# Patient Record
Sex: Male | Born: 1940 | Race: White | Hispanic: No | Smoking: Former smoker
Health system: Southern US, Community
[De-identification: ages and names within clinical notes are randomized; demographics above are authoritative.]

## PROBLEM LIST (undated history)

## (undated) DIAGNOSIS — C61 Malignant neoplasm of prostate: Secondary | ICD-10-CM

## (undated) DIAGNOSIS — K439 Ventral hernia without obstruction or gangrene: Secondary | ICD-10-CM

## (undated) DIAGNOSIS — I4891 Unspecified atrial fibrillation: Secondary | ICD-10-CM

## (undated) DIAGNOSIS — R251 Tremor, unspecified: Secondary | ICD-10-CM

## (undated) DIAGNOSIS — D696 Thrombocytopenia, unspecified: Secondary | ICD-10-CM

## (undated) DIAGNOSIS — R3989 Other symptoms and signs involving the genitourinary system: Secondary | ICD-10-CM

## (undated) DIAGNOSIS — J449 Chronic obstructive pulmonary disease, unspecified: Secondary | ICD-10-CM

## (undated) DIAGNOSIS — M1990 Unspecified osteoarthritis, unspecified site: Secondary | ICD-10-CM

## (undated) DIAGNOSIS — I499 Cardiac arrhythmia, unspecified: Secondary | ICD-10-CM

## (undated) DIAGNOSIS — G629 Polyneuropathy, unspecified: Secondary | ICD-10-CM

## (undated) DIAGNOSIS — G4730 Sleep apnea, unspecified: Secondary | ICD-10-CM

## (undated) DIAGNOSIS — I10 Essential (primary) hypertension: Secondary | ICD-10-CM

## (undated) DIAGNOSIS — Z952 Presence of prosthetic heart valve: Secondary | ICD-10-CM

## (undated) DIAGNOSIS — K30 Functional dyspepsia: Secondary | ICD-10-CM

## (undated) HISTORY — DX: Ventral hernia without obstruction or gangrene: K43.9

## (undated) HISTORY — DX: Polyneuropathy, unspecified: G62.9

## (undated) HISTORY — DX: Sleep apnea, unspecified: G47.30

## (undated) HISTORY — DX: Unspecified atrial fibrillation: I48.91

## (undated) HISTORY — DX: Unspecified osteoarthritis, unspecified site: M19.90

## (undated) HISTORY — PX: VALVE REPLACEMENT: SUR13

## (undated) HISTORY — DX: Tremor, unspecified: R25.1

## (undated) HISTORY — DX: Thrombocytopenia, unspecified: D69.6

## (undated) HISTORY — DX: Presence of prosthetic heart valve: Z95.2

## (undated) HISTORY — DX: Malignant neoplasm of prostate: C61

## (undated) HISTORY — PX: CALDWELL LUC: SHX1284

## (undated) HISTORY — PX: OTHER SURGICAL HISTORY: SHX169

## (undated) HISTORY — DX: Functional dyspepsia: K30

## (undated) HISTORY — DX: Chronic obstructive pulmonary disease, unspecified: J44.9

---

## 1994-08-05 DIAGNOSIS — Z954 Presence of other heart-valve replacement: Secondary | ICD-10-CM | POA: Insufficient documentation

## 2004-10-01 ENCOUNTER — Ambulatory Visit: Payer: Self-pay | Admitting: Cardiology

## 2005-06-19 ENCOUNTER — Ambulatory Visit: Payer: Self-pay | Admitting: Internal Medicine

## 2009-09-19 ENCOUNTER — Ambulatory Visit: Payer: Self-pay | Admitting: Ophthalmology

## 2010-08-27 ENCOUNTER — Ambulatory Visit: Payer: Self-pay | Admitting: Sports Medicine

## 2012-06-26 ENCOUNTER — Ambulatory Visit: Payer: Self-pay | Admitting: Sports Medicine

## 2013-04-21 ENCOUNTER — Ambulatory Visit: Payer: Self-pay | Admitting: Oncology

## 2013-05-06 ENCOUNTER — Ambulatory Visit: Payer: Self-pay | Admitting: Oncology

## 2013-05-06 LAB — CBC CANCER CENTER
Eosinophil #: 0.1 x10 3/mm (ref 0.0–0.7)
Eosinophil %: 1.4 %
HGB: 15.1 g/dL (ref 13.0–18.0)
Lymphocyte %: 28.7 %
MCHC: 33.8 g/dL (ref 32.0–36.0)
MCV: 100 fL (ref 80–100)
Monocyte #: 0.3 x10 3/mm (ref 0.2–1.0)
Monocyte %: 7.8 %
Neutrophil #: 2.3 x10 3/mm (ref 1.4–6.5)
Platelet: 88 x10 3/mm — ABNORMAL LOW (ref 150–440)
RBC: 4.47 10*6/uL (ref 4.40–5.90)
RDW: 13.2 % (ref 11.5–14.5)
WBC: 3.7 x10 3/mm — ABNORMAL LOW (ref 3.8–10.6)

## 2013-05-06 LAB — IRON AND TIBC
Iron Bind.Cap.(Total): 392 ug/dL (ref 250–450)
Iron Saturation: 25 %
Unbound Iron-Bind.Cap.: 293 ug/dL

## 2013-05-06 LAB — FERRITIN: Ferritin (ARMC): 89 ng/mL (ref 8–388)

## 2013-05-06 LAB — FOLATE: Folic Acid: 28.6 ng/mL (ref 3.1–100.0)

## 2013-05-06 LAB — LACTATE DEHYDROGENASE: LDH: 183 U/L (ref 85–241)

## 2013-06-05 ENCOUNTER — Ambulatory Visit: Payer: Self-pay | Admitting: Oncology

## 2014-10-04 DIAGNOSIS — R310 Gross hematuria: Secondary | ICD-10-CM | POA: Insufficient documentation

## 2014-10-04 DIAGNOSIS — R339 Retention of urine, unspecified: Secondary | ICD-10-CM | POA: Insufficient documentation

## 2014-10-06 DIAGNOSIS — R8281 Pyuria: Secondary | ICD-10-CM | POA: Insufficient documentation

## 2014-11-16 ENCOUNTER — Emergency Department
Admit: 2014-11-16 | Disposition: A | Discharge status: Home / Self Care | Payer: Self-pay | Admitting: Emergency Medicine

## 2014-11-22 DIAGNOSIS — C61 Malignant neoplasm of prostate: Secondary | ICD-10-CM | POA: Diagnosis present

## 2014-12-02 DIAGNOSIS — D696 Thrombocytopenia, unspecified: Secondary | ICD-10-CM | POA: Insufficient documentation

## 2014-12-07 DIAGNOSIS — R0602 Shortness of breath: Secondary | ICD-10-CM | POA: Insufficient documentation

## 2014-12-13 ENCOUNTER — Encounter: Payer: Self-pay | Admitting: Oncology

## 2014-12-13 ENCOUNTER — Encounter (INDEPENDENT_AMBULATORY_CARE_PROVIDER_SITE_OTHER): Payer: Self-pay

## 2014-12-13 ENCOUNTER — Inpatient Hospital Stay: Payer: PPO | Attending: Oncology | Admitting: Oncology

## 2014-12-13 VITALS — BP 123/78 | HR 82 | Temp 95.9°F | Resp 18 | Ht 68.9 in | Wt 215.4 lb

## 2014-12-13 DIAGNOSIS — M129 Arthropathy, unspecified: Secondary | ICD-10-CM | POA: Diagnosis not present

## 2014-12-13 DIAGNOSIS — Z8546 Personal history of malignant neoplasm of prostate: Secondary | ICD-10-CM | POA: Diagnosis not present

## 2014-12-13 DIAGNOSIS — Z87891 Personal history of nicotine dependence: Secondary | ICD-10-CM

## 2014-12-13 DIAGNOSIS — D72819 Decreased white blood cell count, unspecified: Secondary | ICD-10-CM | POA: Diagnosis not present

## 2014-12-13 DIAGNOSIS — Z79899 Other long term (current) drug therapy: Secondary | ICD-10-CM

## 2014-12-13 DIAGNOSIS — G4730 Sleep apnea, unspecified: Secondary | ICD-10-CM | POA: Diagnosis not present

## 2014-12-13 DIAGNOSIS — J449 Chronic obstructive pulmonary disease, unspecified: Secondary | ICD-10-CM | POA: Diagnosis not present

## 2014-12-13 DIAGNOSIS — I4891 Unspecified atrial fibrillation: Secondary | ICD-10-CM

## 2014-12-13 DIAGNOSIS — D696 Thrombocytopenia, unspecified: Secondary | ICD-10-CM | POA: Insufficient documentation

## 2014-12-13 DIAGNOSIS — G629 Polyneuropathy, unspecified: Secondary | ICD-10-CM | POA: Insufficient documentation

## 2014-12-21 ENCOUNTER — Telehealth: Payer: Self-pay | Admitting: *Deleted

## 2014-12-21 NOTE — Telephone Encounter (Signed)
They need office note form [place] so they know what is going on before they can schedule anything

## 2014-12-26 ENCOUNTER — Telehealth: Payer: Self-pay | Admitting: *Deleted

## 2014-12-26 NOTE — Progress Notes (Signed)
[place]  [phone] Fax:(336) [phone]  ID: Earl Ramirez OB: 1940-08-29  MR#: [id number]  CWC#:[id number]  Patient Care Team: Adrian Prows, MD as PCP - General (Infectious Diseases) Murrell Redden, MD (Urology) Teodoro Spray, MD (Cardiology)  CHIEF COMPLAINT:  Chief Complaint  Patient presents with  . Follow-up    thrombocytopenia last seen 2014    INTERVAL HISTORY: Patient last evaluated in October 2014 referred back for persistent decrease in platelets. He continues to feel well and remains asymptomatic. He has no neurologic complaints. He denies any recent fevers or illnesses. He has a good appetite and denies weight loss. He denies any easy bleeding or bruising. He denies any chest pain or shortness of breath. He denies any nausea, vomiting, constipation, or diarrhea. He has no urinary complaints. Patient feels at his baseline and offers no specific complaints today.  REVIEW OF SYSTEMS:   Review of Systems  Constitutional: Negative.   Endo/Heme/Allergies: Does not bruise/bleed easily.    As per HPI. Otherwise, a complete review of systems is negatve.  PAST MEDICAL HISTORY: Past Medical History  Diagnosis Date  . Atrial fibrillation   . Arthritis   . COPD (chronic obstructive pulmonary disease)   . Sleep apnea   . Neuropathy   . Hernia of abdominal wall   . Indigestion   . Aortic valve replaced     1998  . Prostate cancer   . Tremors of nervous system   . Thrombocytopenia     PAST SURGICAL HISTORY: Past Surgical History  Procedure Laterality Date  . Caldwell luc      FAMILY HISTORY History reviewed. No pertinent family history.     ADVANCED DIRECTIVES:       HEALTH MAINTENANCE: History  Substance Use Topics  . Smoking status: Former Smoker    Types: Cigarettes  . Smokeless tobacco: Former Systems developer    Quit date: 09/24/2000  . Alcohol Use: No     Colonoscopy:  PAP:  Bone density:  Lipid panel:  Allergies    Allergen Reactions  . Oxycodone-Acetaminophen Nausea Only and Shortness Of Breath  . Amoxicillin Itching    Hand itching  . Penicillins Rash    Current Outpatient Prescriptions  Medication Sig Dispense Refill  . albuterol (PROVENTIL HFA;VENTOLIN HFA) 108 (90 BASE) MCG/ACT inhaler Inhale into the lungs.    Marland Kitchen aspirin EC 81 MG tablet Take 81 mg by mouth.    . folic acid (FOLVITE) 1 MG tablet Take 1 mg by mouth.    . metoprolol succinate (TOPROL-XL) 25 MG 24 hr tablet Take by mouth.    . tamsulosin (FLOMAX) 0.4 MG CAPS capsule Take 0.4 mg by mouth.     No current facility-administered medications for this visit.    OBJECTIVE: Filed Vitals:   12/13/14 1518  BP: 123/78  Pulse: 82  Temp: 95.9 F (35.5 C)  Resp: 18     Body mass index is 31.9 kg/(m^2).    ECOG FS:0 - Asymptomatic  General: Well-developed, well-nourished, no acute distress. Eyes: Pink conjunctiva, anicteric sclera. HEENT: Normocephalic, moist mucous membranes, clear oropharnyx. Lungs: Clear to auscultation bilaterally. Heart: Regular rate and rhythm. No rubs, murmurs, or gallops. Abdomen: Soft, nontender, nondistended. No organomegaly noted, normoactive bowel sounds. Musculoskeletal: No edema, cyanosis, or clubbing. Neuro: Alert, answering all questions appropriately. Cranial nerves grossly intact. Skin: No rashes or petechiae noted. Psych: Normal affect.   LAB RESULTS:  No results found for: NA, K, CL, CO2, GLUCOSE, BUN, CREATININE, CALCIUM,  PROT, ALBUMIN, AST, ALT, ALKPHOS, BILITOT, GFRNONAA, GFRAA  Lab Results  Component Value Date   WBC 3.7* 05/06/2013   NEUTROABS 2.3 05/06/2013   HGB 15.1 05/06/2013   HCT 44.6 05/06/2013   MCV 100 05/06/2013   PLT 88* 05/06/2013     STUDIES: No results found.  ASSESSMENT: Leukopenia and thrombocytopenia.  PLAN:   1.  Leukopenia and thrombocytopenia: Patient's platelet count was reported at 74 on December 02, 2014. White blood cell count was 2.8. Both are  decreased, but essentially unchanged for over 2 years.  He is also asymptomatic.  The remainder of his laboratory work with either negative or within normal limits. Given his age, it is possible he has underlying MDS which would require a bone marrow biopsy to confirm, but this is not necessary at this point.  If patient's counts declined further or he became symptomatic would consider a biopsy then.  Will get an abdominal ultrasound to assess for splenomegaly for completeness.  No intervention is needed at this time.  Return to clinic in 6 months with repeat laboratory work and further evaluation.    Patient expressed understanding and was in agreement with this plan. He also understands that He can call clinic at any time with any questions, concerns, or complaints.   No matching staging information was found for the patient.  Lloyd Huger, MD   12/26/2014 11:29 AM

## 2014-12-26 NOTE — Telephone Encounter (Signed)
Asking if his liver needs to be examined with the limited US abd ordered for tomorrow

## 2014-12-26 NOTE — Telephone Encounter (Signed)
No liver, radiology informed

## 2014-12-27 ENCOUNTER — Ambulatory Visit
Admission: RE | Admit: 2014-12-27 | Discharge: 2014-12-27 | Disposition: A | Discharge status: Home / Self Care | Payer: PPO | Source: Ambulatory Visit | Attending: Oncology | Admitting: Oncology

## 2014-12-27 DIAGNOSIS — R161 Splenomegaly, not elsewhere classified: Secondary | ICD-10-CM | POA: Insufficient documentation

## 2014-12-27 DIAGNOSIS — D696 Thrombocytopenia, unspecified: Secondary | ICD-10-CM | POA: Insufficient documentation

## 2015-01-30 DIAGNOSIS — I4892 Unspecified atrial flutter: Secondary | ICD-10-CM | POA: Insufficient documentation

## 2015-02-24 ENCOUNTER — Ambulatory Visit: Payer: PPO | Admitting: Radiation Oncology

## 2015-04-07 DIAGNOSIS — R339 Retention of urine, unspecified: Secondary | ICD-10-CM | POA: Insufficient documentation

## 2015-04-12 ENCOUNTER — Encounter: Payer: Self-pay | Admitting: Radiation Oncology

## 2015-04-12 ENCOUNTER — Ambulatory Visit
Admission: RE | Admit: 2015-04-12 | Discharge: 2015-04-12 | Disposition: A | Discharge status: Home / Self Care | Payer: PPO | Source: Ambulatory Visit | Attending: Radiation Oncology | Admitting: Radiation Oncology

## 2015-04-12 ENCOUNTER — Encounter (INDEPENDENT_AMBULATORY_CARE_PROVIDER_SITE_OTHER): Payer: Self-pay

## 2015-04-12 VITALS — BP 137/79 | HR 93 | Temp 98.3°F | Resp 20 | Wt 221.0 lb

## 2015-04-12 DIAGNOSIS — Z79899 Other long term (current) drug therapy: Secondary | ICD-10-CM | POA: Insufficient documentation

## 2015-04-12 DIAGNOSIS — R3915 Urgency of urination: Secondary | ICD-10-CM | POA: Insufficient documentation

## 2015-04-12 DIAGNOSIS — Z510 Encounter for antineoplastic radiation therapy: Secondary | ICD-10-CM | POA: Insufficient documentation

## 2015-04-12 DIAGNOSIS — C61 Malignant neoplasm of prostate: Secondary | ICD-10-CM | POA: Insufficient documentation

## 2015-04-12 DIAGNOSIS — Z952 Presence of prosthetic heart valve: Secondary | ICD-10-CM | POA: Insufficient documentation

## 2015-04-12 DIAGNOSIS — Z79818 Long term (current) use of other agents affecting estrogen receptors and estrogen levels: Secondary | ICD-10-CM | POA: Insufficient documentation

## 2015-04-12 DIAGNOSIS — J449 Chronic obstructive pulmonary disease, unspecified: Secondary | ICD-10-CM | POA: Insufficient documentation

## 2015-04-12 DIAGNOSIS — I4891 Unspecified atrial fibrillation: Secondary | ICD-10-CM | POA: Insufficient documentation

## 2015-04-12 NOTE — Consult Note (Signed)
Except an outstanding is perfect of Radiation Oncology NEW PATIENT EVALUATION  Name: Earl Ramirez  MRN: [id number]  Date:   04/12/2015     DOB: 1940-08-31   This 74 y.o. male patient presents to the clinic for initial evaluation of prostate cancer.  REFERRING PHYSICIAN: Adrian Prows, MD  CHIEF COMPLAINT:  Chief Complaint  Patient presents with  . Prostate Cancer    Pt is here for initial consultation of prostate cancer.      DIAGNOSIS: The encounter diagnosis was Malignant neoplasm of prostate.   PREVIOUS INVESTIGATIONS:  Surgical pathology report reviewed CT and bone scan requested for my review Clinical notes reviewed  HPI: Patient is a pleasant 74 year old male who presented with elevated PSA in the 19 range was found to have significant urinary obstruction. Biopsy of his prostate revealed Gleason 8 (4+4) adenocarcinoma. Underwent an transurethral electrosurgical prostatectomy (TURP). Also had a bladder stone removed at the same time. He has been on Lupron which has responded well dropping his PSA to 1.3 range. He had a CT scan showing large heterogeneous prostate gland no evidence of pelvic lymphadenopathy is noted. Bone scan also revealed no evidence of metastatic bone disease. He is doing fairly well at this time does wear depends undergarment occasionally has urgency with slight stress incontinence. He is seen today for consideration of radiation therapy.  PLANNED TREATMENT REGIMEN: Image guided I MRT radiation therapy to prostate and pelvic nodes  PAST MEDICAL HISTORY:  has a past medical history of Atrial fibrillation; Arthritis; COPD (chronic obstructive pulmonary disease); Sleep apnea; Neuropathy; Hernia of abdominal wall; Indigestion; Aortic valve replaced; Prostate cancer; Tremors of nervous system; and Thrombocytopenia.    PAST SURGICAL HISTORY:  Past Surgical History  Procedure Laterality Date  . Caldwell luc      FAMILY HISTORY: family history is not on  file.  SOCIAL HISTORY:  reports that he has quit smoking. His smoking use included Cigarettes. He quit smokeless tobacco use about 14 years ago. He reports that he does not drink alcohol or use illicit drugs.  ALLERGIES: Oxycodone-acetaminophen; Amoxicillin; and Penicillins  MEDICATIONS:  Current Outpatient Prescriptions  Medication Sig Dispense Refill  . albuterol (PROVENTIL HFA;VENTOLIN HFA) 108 (90 BASE) MCG/ACT inhaler Inhale into the lungs.    Marland Kitchen aspirin EC 81 MG tablet Take 81 mg by mouth.    . gabapentin (NEURONTIN) 300 MG capsule Take by mouth.    Marland Kitchen HYDROcodone-acetaminophen (NORCO/VICODIN) 5-325 MG per tablet Take by mouth.    . metoprolol succinate (TOPROL-XL) 25 MG 24 hr tablet Take by mouth.    . tamsulosin (FLOMAX) 0.4 MG CAPS capsule Take 0.4 mg by mouth.    . folic acid (FOLVITE) 1 MG tablet Take 1 mg by mouth.     No current facility-administered medications for this encounter.    ECOG PERFORMANCE STATUS:  0 - Asymptomatic  REVIEW OF SYSTEMS: Except for the urinary symptoms as described above Patient denies any weight loss, fatigue, weakness, fever, chills or night sweats. Patient denies any loss of vision, blurred vision. Patient denies any ringing  of the ears or hearing loss. No irregular heartbeat. Patient denies heart murmur or history of fainting. Patient denies any chest pain or pain radiating to her upper extremities. Patient denies any shortness of breath, difficulty breathing at night, cough or hemoptysis. Patient denies any swelling in the lower legs. Patient denies any nausea vomiting, vomiting of blood, or coffee ground material in the vomitus. Patient denies any stomach pain. Patient states has  had normal bowel movements no significant constipation or diarrhea. Patient denies any dysuria, hematuria or significant nocturia. Patient denies any problems walking, swelling in the joints or loss of balance. Patient denies any skin changes, loss of hair or loss of weight.  Patient denies any excessive worrying or anxiety or significant depression. Patient denies any problems with insomnia. Patient denies excessive thirst, polyuria, polydipsia. Patient denies any swollen glands, patient denies easy bruising or easy bleeding. Patient denies any recent infections, allergies or URI. Patient "s visual fields have not changed significantly in recent time.    PHYSICAL EXAM: BP 137/79 mmHg  Pulse 93  Temp(Src) 98.3 F (36.8 C)  Resp 20  Wt 221 lb 0.2 oz (100.25 kg) On rectal exam rectal sphincter tone is good prostate is markedly decreased in size without evidence of nodularity or mass. No other rectal abnormalities identified. Patient does ambulate with excisions of a cane. Well-developed well-nourished patient in NAD. HEENT reveals PERLA, EOMI, discs not visualized.  Oral cavity is clear. No oral mucosal lesions are identified. Neck is clear without evidence of cervical or supraclavicular adenopathy. Lungs are clear to A&P. Cardiac examination is essentially unremarkable with regular rate and rhythm without murmur rub or thrill. Abdomen is benign with no organomegaly or masses noted. Motor sensory and DTR levels are equal and symmetric in the upper and lower extremities. Cranial nerves II through XII are grossly intact. Proprioception is intact. No peripheral adenopathy or edema is identified. No motor or sensory levels are noted. Crude visual fields are within normal range.   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  PLAN: At this time I to go ahead with image guided I MRT radiation therapy to his prostate. Would treat up to 8000 cGy to his prostate. I believe his pelvic lymph nodes would also need treatment up to 5400 cGy based on the high  probability over 30% of pelvic lymph node involvement based on his high Gleason score and elevated PSA close to 20. Risks and benefits of treatment including increased urinary symptoms, diarrhea, fatigue, alteration of blood counts, skin reaction all were discussed in detail with the patient. I have set up and ordered CT simulation shortly after placement of gold fiduciary markers for image guided treatment. Patient also benefited from at least a year and a half of Lupron suppression and is orally started that under urology's direction. Patient seems to comprehend my treatment plan well.  I would like to take this opportunity for allowing me to participate in the care of your patient.Armstead Peaks., MD

## 2015-05-01 ENCOUNTER — Ambulatory Visit
Admission: RE | Admit: 2015-05-01 | Discharge: 2015-05-01 | Disposition: A | Discharge status: Home / Self Care | Payer: PPO | Source: Ambulatory Visit | Attending: Radiation Oncology | Admitting: Radiation Oncology

## 2015-05-01 DIAGNOSIS — I4891 Unspecified atrial fibrillation: Secondary | ICD-10-CM | POA: Diagnosis not present

## 2015-05-01 DIAGNOSIS — C61 Malignant neoplasm of prostate: Secondary | ICD-10-CM | POA: Diagnosis not present

## 2015-05-01 DIAGNOSIS — R3915 Urgency of urination: Secondary | ICD-10-CM | POA: Diagnosis not present

## 2015-05-01 DIAGNOSIS — Z952 Presence of prosthetic heart valve: Secondary | ICD-10-CM | POA: Diagnosis not present

## 2015-05-01 DIAGNOSIS — Z79818 Long term (current) use of other agents affecting estrogen receptors and estrogen levels: Secondary | ICD-10-CM | POA: Diagnosis not present

## 2015-05-01 DIAGNOSIS — Z79899 Other long term (current) drug therapy: Secondary | ICD-10-CM | POA: Diagnosis not present

## 2015-05-01 DIAGNOSIS — Z510 Encounter for antineoplastic radiation therapy: Secondary | ICD-10-CM | POA: Diagnosis present

## 2015-05-01 DIAGNOSIS — J449 Chronic obstructive pulmonary disease, unspecified: Secondary | ICD-10-CM | POA: Diagnosis not present

## 2015-05-08 ENCOUNTER — Other Ambulatory Visit: Payer: Self-pay | Admitting: *Deleted

## 2015-05-08 DIAGNOSIS — C61 Malignant neoplasm of prostate: Secondary | ICD-10-CM

## 2015-05-09 DIAGNOSIS — Z510 Encounter for antineoplastic radiation therapy: Secondary | ICD-10-CM | POA: Diagnosis not present

## 2015-05-10 ENCOUNTER — Ambulatory Visit
Admission: RE | Admit: 2015-05-10 | Discharge: 2015-05-10 | Disposition: A | Discharge status: Home / Self Care | Payer: PPO | Source: Ambulatory Visit | Attending: Radiation Oncology | Admitting: Radiation Oncology

## 2015-05-10 DIAGNOSIS — Z510 Encounter for antineoplastic radiation therapy: Secondary | ICD-10-CM | POA: Diagnosis not present

## 2015-05-11 ENCOUNTER — Ambulatory Visit
Admission: RE | Admit: 2015-05-11 | Discharge: 2015-05-11 | Disposition: A | Discharge status: Home / Self Care | Payer: PPO | Source: Ambulatory Visit | Attending: Radiation Oncology | Admitting: Radiation Oncology

## 2015-05-11 DIAGNOSIS — Z510 Encounter for antineoplastic radiation therapy: Secondary | ICD-10-CM | POA: Diagnosis not present

## 2015-05-12 ENCOUNTER — Ambulatory Visit
Admission: RE | Admit: 2015-05-12 | Discharge: 2015-05-12 | Disposition: A | Discharge status: Home / Self Care | Payer: PPO | Source: Ambulatory Visit | Attending: Radiation Oncology | Admitting: Radiation Oncology

## 2015-05-12 DIAGNOSIS — Z510 Encounter for antineoplastic radiation therapy: Secondary | ICD-10-CM | POA: Diagnosis not present

## 2015-05-15 ENCOUNTER — Ambulatory Visit
Admission: RE | Admit: 2015-05-15 | Discharge: 2015-05-15 | Disposition: A | Discharge status: Home / Self Care | Payer: PPO | Source: Ambulatory Visit | Attending: Radiation Oncology | Admitting: Radiation Oncology

## 2015-05-15 DIAGNOSIS — Z510 Encounter for antineoplastic radiation therapy: Secondary | ICD-10-CM | POA: Diagnosis not present

## 2015-05-16 ENCOUNTER — Ambulatory Visit
Admission: RE | Admit: 2015-05-16 | Discharge: 2015-05-16 | Disposition: A | Discharge status: Home / Self Care | Payer: PPO | Source: Ambulatory Visit | Attending: Radiation Oncology | Admitting: Radiation Oncology

## 2015-05-16 ENCOUNTER — Other Ambulatory Visit: Payer: Self-pay | Admitting: *Deleted

## 2015-05-16 DIAGNOSIS — Z510 Encounter for antineoplastic radiation therapy: Secondary | ICD-10-CM | POA: Diagnosis not present

## 2015-05-16 MED ORDER — PHENAZOPYRIDINE HCL 200 MG PO TABS: 200.0000 mg | ORAL_TABLET | Freq: Three times a day (TID) | ORAL | Status: DC | PRN

## 2015-05-17 ENCOUNTER — Ambulatory Visit
Admission: RE | Admit: 2015-05-17 | Discharge: 2015-05-17 | Disposition: A | Discharge status: Home / Self Care | Payer: PPO | Source: Ambulatory Visit | Attending: Radiation Oncology | Admitting: Radiation Oncology

## 2015-05-17 DIAGNOSIS — Z510 Encounter for antineoplastic radiation therapy: Secondary | ICD-10-CM | POA: Diagnosis not present

## 2015-05-18 ENCOUNTER — Ambulatory Visit
Admission: RE | Admit: 2015-05-18 | Discharge: 2015-05-18 | Disposition: A | Discharge status: Home / Self Care | Payer: PPO | Source: Ambulatory Visit | Attending: Radiation Oncology | Admitting: Radiation Oncology

## 2015-05-18 DIAGNOSIS — Z510 Encounter for antineoplastic radiation therapy: Secondary | ICD-10-CM | POA: Diagnosis not present

## 2015-05-19 ENCOUNTER — Ambulatory Visit: Payer: PPO

## 2015-05-22 ENCOUNTER — Ambulatory Visit
Admission: RE | Admit: 2015-05-22 | Discharge: 2015-05-22 | Disposition: A | Discharge status: Home / Self Care | Payer: PPO | Source: Ambulatory Visit | Attending: Radiation Oncology | Admitting: Radiation Oncology

## 2015-05-22 DIAGNOSIS — Z510 Encounter for antineoplastic radiation therapy: Secondary | ICD-10-CM | POA: Diagnosis not present

## 2015-05-23 ENCOUNTER — Ambulatory Visit
Admission: RE | Admit: 2015-05-23 | Discharge: 2015-05-23 | Disposition: A | Discharge status: Home / Self Care | Payer: PPO | Source: Ambulatory Visit | Attending: Radiation Oncology | Admitting: Radiation Oncology

## 2015-05-23 DIAGNOSIS — Z510 Encounter for antineoplastic radiation therapy: Secondary | ICD-10-CM | POA: Diagnosis not present

## 2015-05-24 ENCOUNTER — Ambulatory Visit: Payer: PPO

## 2015-05-25 ENCOUNTER — Inpatient Hospital Stay: Payer: PPO | Attending: Radiation Oncology

## 2015-05-25 ENCOUNTER — Ambulatory Visit
Admission: RE | Admit: 2015-05-25 | Discharge: 2015-05-25 | Disposition: A | Discharge status: Home / Self Care | Payer: PPO | Source: Ambulatory Visit | Attending: Radiation Oncology | Admitting: Radiation Oncology

## 2015-05-25 DIAGNOSIS — C61 Malignant neoplasm of prostate: Secondary | ICD-10-CM | POA: Diagnosis present

## 2015-05-25 DIAGNOSIS — D696 Thrombocytopenia, unspecified: Secondary | ICD-10-CM | POA: Diagnosis not present

## 2015-05-25 DIAGNOSIS — Z510 Encounter for antineoplastic radiation therapy: Secondary | ICD-10-CM | POA: Diagnosis not present

## 2015-05-25 LAB — CBC WITH DIFFERENTIAL/PLATELET
Basophils Absolute: 0 10*3/uL (ref 0–0.1)
Basophils Relative: 0 %
EOS PCT: 2 %
Eosinophils Absolute: 0 10*3/uL (ref 0–0.7)
HCT: 40.7 % (ref 40.0–52.0)
HEMOGLOBIN: 13.6 g/dL (ref 13.0–18.0)
LYMPHS ABS: 0.4 10*3/uL — AB (ref 1.0–3.6)
Lymphocytes Relative: 20 %
MCH: 32.4 pg (ref 26.0–34.0)
MCHC: 33.3 g/dL (ref 32.0–36.0)
MCV: 97.2 fL (ref 80.0–100.0)
Monocytes Absolute: 0.2 10*3/uL (ref 0.2–1.0)
Monocytes Relative: 11 %
NEUTROS PCT: 67 %
Neutro Abs: 1.5 10*3/uL (ref 1.4–6.5)
Platelets: 50 10*3/uL — ABNORMAL LOW (ref 150–440)
RBC: 4.19 MIL/uL — AB (ref 4.40–5.90)
RDW: 12.9 % (ref 11.5–14.5)
WBC: 2.2 10*3/uL — AB (ref 3.8–10.6)

## 2015-05-26 ENCOUNTER — Ambulatory Visit
Admission: RE | Admit: 2015-05-26 | Discharge: 2015-05-26 | Disposition: A | Discharge status: Home / Self Care | Payer: PPO | Source: Ambulatory Visit | Attending: Radiation Oncology | Admitting: Radiation Oncology

## 2015-05-26 DIAGNOSIS — Z510 Encounter for antineoplastic radiation therapy: Secondary | ICD-10-CM | POA: Diagnosis not present

## 2015-05-29 ENCOUNTER — Ambulatory Visit
Admission: RE | Admit: 2015-05-29 | Discharge: 2015-05-29 | Disposition: A | Discharge status: Home / Self Care | Payer: PPO | Source: Ambulatory Visit | Attending: Radiation Oncology | Admitting: Radiation Oncology

## 2015-05-29 DIAGNOSIS — Z510 Encounter for antineoplastic radiation therapy: Secondary | ICD-10-CM | POA: Diagnosis not present

## 2015-05-30 ENCOUNTER — Ambulatory Visit
Admission: RE | Admit: 2015-05-30 | Discharge: 2015-05-30 | Disposition: A | Discharge status: Home / Self Care | Payer: PPO | Source: Ambulatory Visit | Attending: Radiation Oncology | Admitting: Radiation Oncology

## 2015-05-30 DIAGNOSIS — Z510 Encounter for antineoplastic radiation therapy: Secondary | ICD-10-CM | POA: Diagnosis not present

## 2015-05-31 ENCOUNTER — Ambulatory Visit
Admission: RE | Admit: 2015-05-31 | Discharge: 2015-05-31 | Disposition: A | Discharge status: Home / Self Care | Payer: PPO | Source: Ambulatory Visit | Attending: Radiation Oncology | Admitting: Radiation Oncology

## 2015-05-31 DIAGNOSIS — Z510 Encounter for antineoplastic radiation therapy: Secondary | ICD-10-CM | POA: Diagnosis not present

## 2015-06-01 ENCOUNTER — Ambulatory Visit
Admission: RE | Admit: 2015-06-01 | Discharge: 2015-06-01 | Disposition: A | Discharge status: Home / Self Care | Payer: PPO | Source: Ambulatory Visit | Attending: Radiation Oncology | Admitting: Radiation Oncology

## 2015-06-01 DIAGNOSIS — Z510 Encounter for antineoplastic radiation therapy: Secondary | ICD-10-CM | POA: Diagnosis not present

## 2015-06-02 ENCOUNTER — Ambulatory Visit
Admission: RE | Admit: 2015-06-02 | Discharge: 2015-06-02 | Disposition: A | Discharge status: Home / Self Care | Payer: PPO | Source: Ambulatory Visit | Attending: Radiation Oncology | Admitting: Radiation Oncology

## 2015-06-02 DIAGNOSIS — Z510 Encounter for antineoplastic radiation therapy: Secondary | ICD-10-CM | POA: Diagnosis not present

## 2015-06-04 ENCOUNTER — Ambulatory Visit
Admission: RE | Admit: 2015-06-04 | Discharge: 2015-06-04 | Disposition: A | Discharge status: Home / Self Care | Payer: PPO | Source: Ambulatory Visit | Attending: Radiation Oncology | Admitting: Radiation Oncology

## 2015-06-05 ENCOUNTER — Ambulatory Visit
Admission: RE | Admit: 2015-06-05 | Discharge: 2015-06-05 | Disposition: A | Discharge status: Home / Self Care | Payer: PPO | Source: Ambulatory Visit | Attending: Radiation Oncology | Admitting: Radiation Oncology

## 2015-06-05 DIAGNOSIS — Z510 Encounter for antineoplastic radiation therapy: Secondary | ICD-10-CM | POA: Diagnosis not present

## 2015-06-06 ENCOUNTER — Ambulatory Visit
Admission: RE | Admit: 2015-06-06 | Discharge: 2015-06-06 | Disposition: A | Discharge status: Home / Self Care | Payer: PPO | Source: Ambulatory Visit | Attending: Radiation Oncology | Admitting: Radiation Oncology

## 2015-06-06 DIAGNOSIS — Z510 Encounter for antineoplastic radiation therapy: Secondary | ICD-10-CM | POA: Diagnosis not present

## 2015-06-07 ENCOUNTER — Ambulatory Visit
Admission: RE | Admit: 2015-06-07 | Discharge: 2015-06-07 | Disposition: A | Discharge status: Home / Self Care | Payer: PPO | Source: Ambulatory Visit | Attending: Radiation Oncology | Admitting: Radiation Oncology

## 2015-06-07 DIAGNOSIS — Z510 Encounter for antineoplastic radiation therapy: Secondary | ICD-10-CM | POA: Diagnosis not present

## 2015-06-08 ENCOUNTER — Ambulatory Visit
Admission: RE | Admit: 2015-06-08 | Discharge: 2015-06-08 | Disposition: A | Discharge status: Home / Self Care | Payer: PPO | Source: Ambulatory Visit | Attending: Radiation Oncology | Admitting: Radiation Oncology

## 2015-06-08 ENCOUNTER — Inpatient Hospital Stay: Payer: PPO | Attending: Radiation Oncology

## 2015-06-08 DIAGNOSIS — R251 Tremor, unspecified: Secondary | ICD-10-CM | POA: Insufficient documentation

## 2015-06-08 DIAGNOSIS — K439 Ventral hernia without obstruction or gangrene: Secondary | ICD-10-CM | POA: Insufficient documentation

## 2015-06-08 DIAGNOSIS — Z8546 Personal history of malignant neoplasm of prostate: Secondary | ICD-10-CM | POA: Diagnosis not present

## 2015-06-08 DIAGNOSIS — G629 Polyneuropathy, unspecified: Secondary | ICD-10-CM | POA: Insufficient documentation

## 2015-06-08 DIAGNOSIS — M129 Arthropathy, unspecified: Secondary | ICD-10-CM | POA: Insufficient documentation

## 2015-06-08 DIAGNOSIS — Z87891 Personal history of nicotine dependence: Secondary | ICD-10-CM | POA: Insufficient documentation

## 2015-06-08 DIAGNOSIS — K30 Functional dyspepsia: Secondary | ICD-10-CM | POA: Insufficient documentation

## 2015-06-08 DIAGNOSIS — Z510 Encounter for antineoplastic radiation therapy: Secondary | ICD-10-CM | POA: Diagnosis not present

## 2015-06-08 DIAGNOSIS — J449 Chronic obstructive pulmonary disease, unspecified: Secondary | ICD-10-CM | POA: Diagnosis not present

## 2015-06-08 DIAGNOSIS — Z79899 Other long term (current) drug therapy: Secondary | ICD-10-CM | POA: Insufficient documentation

## 2015-06-08 DIAGNOSIS — G4730 Sleep apnea, unspecified: Secondary | ICD-10-CM | POA: Insufficient documentation

## 2015-06-08 DIAGNOSIS — C169 Malignant neoplasm of stomach, unspecified: Secondary | ICD-10-CM | POA: Diagnosis not present

## 2015-06-08 DIAGNOSIS — D72819 Decreased white blood cell count, unspecified: Secondary | ICD-10-CM | POA: Diagnosis not present

## 2015-06-08 DIAGNOSIS — D696 Thrombocytopenia, unspecified: Secondary | ICD-10-CM | POA: Insufficient documentation

## 2015-06-08 DIAGNOSIS — I4891 Unspecified atrial fibrillation: Secondary | ICD-10-CM | POA: Insufficient documentation

## 2015-06-08 DIAGNOSIS — Z7982 Long term (current) use of aspirin: Secondary | ICD-10-CM | POA: Diagnosis not present

## 2015-06-08 DIAGNOSIS — C61 Malignant neoplasm of prostate: Secondary | ICD-10-CM

## 2015-06-08 LAB — CBC
HCT: 40.7 % (ref 40.0–52.0)
HEMOGLOBIN: 13.5 g/dL (ref 13.0–18.0)
MCH: 32.4 pg (ref 26.0–34.0)
MCHC: 33.2 g/dL (ref 32.0–36.0)
MCV: 97.6 fL (ref 80.0–100.0)
PLATELETS: 44 10*3/uL — AB (ref 150–440)
RBC: 4.17 MIL/uL — AB (ref 4.40–5.90)
RDW: 13.6 % (ref 11.5–14.5)
WBC: 1.7 10*3/uL — ABNORMAL LOW (ref 3.8–10.6)

## 2015-06-09 ENCOUNTER — Ambulatory Visit
Admission: RE | Admit: 2015-06-09 | Discharge: 2015-06-09 | Disposition: A | Discharge status: Home / Self Care | Payer: PPO | Source: Ambulatory Visit | Attending: Radiation Oncology | Admitting: Radiation Oncology

## 2015-06-09 DIAGNOSIS — Z510 Encounter for antineoplastic radiation therapy: Secondary | ICD-10-CM | POA: Diagnosis not present

## 2015-06-12 ENCOUNTER — Ambulatory Visit
Admission: RE | Admit: 2015-06-12 | Discharge: 2015-06-12 | Disposition: A | Discharge status: Home / Self Care | Payer: PPO | Source: Ambulatory Visit | Attending: Radiation Oncology | Admitting: Radiation Oncology

## 2015-06-12 DIAGNOSIS — Z510 Encounter for antineoplastic radiation therapy: Secondary | ICD-10-CM | POA: Diagnosis not present

## 2015-06-13 ENCOUNTER — Ambulatory Visit
Admission: RE | Admit: 2015-06-13 | Discharge: 2015-06-13 | Disposition: A | Discharge status: Home / Self Care | Payer: PPO | Source: Ambulatory Visit | Attending: Radiation Oncology | Admitting: Radiation Oncology

## 2015-06-13 DIAGNOSIS — Z510 Encounter for antineoplastic radiation therapy: Secondary | ICD-10-CM | POA: Diagnosis not present

## 2015-06-14 ENCOUNTER — Ambulatory Visit
Admission: RE | Admit: 2015-06-14 | Discharge: 2015-06-14 | Disposition: A | Discharge status: Home / Self Care | Payer: PPO | Source: Ambulatory Visit | Attending: Radiation Oncology | Admitting: Radiation Oncology

## 2015-06-14 DIAGNOSIS — Z510 Encounter for antineoplastic radiation therapy: Secondary | ICD-10-CM | POA: Diagnosis not present

## 2015-06-15 ENCOUNTER — Ambulatory Visit
Admission: RE | Admit: 2015-06-15 | Discharge: 2015-06-15 | Disposition: A | Discharge status: Home / Self Care | Payer: PPO | Source: Ambulatory Visit | Attending: Radiation Oncology | Admitting: Radiation Oncology

## 2015-06-15 DIAGNOSIS — Z510 Encounter for antineoplastic radiation therapy: Secondary | ICD-10-CM | POA: Diagnosis not present

## 2015-06-16 ENCOUNTER — Ambulatory Visit: Payer: PPO

## 2015-06-19 ENCOUNTER — Ambulatory Visit
Admission: RE | Admit: 2015-06-19 | Discharge: 2015-06-19 | Disposition: A | Discharge status: Home / Self Care | Payer: PPO | Source: Ambulatory Visit | Attending: Radiation Oncology | Admitting: Radiation Oncology

## 2015-06-19 ENCOUNTER — Other Ambulatory Visit: Payer: Self-pay | Admitting: *Deleted

## 2015-06-19 DIAGNOSIS — D696 Thrombocytopenia, unspecified: Secondary | ICD-10-CM

## 2015-06-19 DIAGNOSIS — Z510 Encounter for antineoplastic radiation therapy: Secondary | ICD-10-CM | POA: Diagnosis not present

## 2015-06-20 ENCOUNTER — Ambulatory Visit
Admission: RE | Admit: 2015-06-20 | Discharge: 2015-06-20 | Disposition: A | Discharge status: Home / Self Care | Payer: PPO | Source: Ambulatory Visit | Attending: Radiation Oncology | Admitting: Radiation Oncology

## 2015-06-20 ENCOUNTER — Inpatient Hospital Stay: Payer: PPO

## 2015-06-20 ENCOUNTER — Inpatient Hospital Stay (HOSPITAL_BASED_OUTPATIENT_CLINIC_OR_DEPARTMENT_OTHER): Payer: PPO | Admitting: Oncology

## 2015-06-20 VITALS — BP 114/71 | HR 89 | Temp 96.8°F | Resp 16 | Wt 222.9 lb

## 2015-06-20 DIAGNOSIS — D72819 Decreased white blood cell count, unspecified: Secondary | ICD-10-CM | POA: Diagnosis not present

## 2015-06-20 DIAGNOSIS — J449 Chronic obstructive pulmonary disease, unspecified: Secondary | ICD-10-CM

## 2015-06-20 DIAGNOSIS — D696 Thrombocytopenia, unspecified: Secondary | ICD-10-CM | POA: Diagnosis not present

## 2015-06-20 DIAGNOSIS — C169 Malignant neoplasm of stomach, unspecified: Secondary | ICD-10-CM

## 2015-06-20 DIAGNOSIS — Z510 Encounter for antineoplastic radiation therapy: Secondary | ICD-10-CM | POA: Diagnosis not present

## 2015-06-20 DIAGNOSIS — K439 Ventral hernia without obstruction or gangrene: Secondary | ICD-10-CM

## 2015-06-20 DIAGNOSIS — G629 Polyneuropathy, unspecified: Secondary | ICD-10-CM

## 2015-06-20 DIAGNOSIS — R251 Tremor, unspecified: Secondary | ICD-10-CM

## 2015-06-20 DIAGNOSIS — Z7982 Long term (current) use of aspirin: Secondary | ICD-10-CM | POA: Diagnosis not present

## 2015-06-20 DIAGNOSIS — I4891 Unspecified atrial fibrillation: Secondary | ICD-10-CM

## 2015-06-20 DIAGNOSIS — Z79899 Other long term (current) drug therapy: Secondary | ICD-10-CM

## 2015-06-20 DIAGNOSIS — G4730 Sleep apnea, unspecified: Secondary | ICD-10-CM

## 2015-06-20 DIAGNOSIS — Z8546 Personal history of malignant neoplasm of prostate: Secondary | ICD-10-CM

## 2015-06-20 DIAGNOSIS — Z87891 Personal history of nicotine dependence: Secondary | ICD-10-CM

## 2015-06-20 DIAGNOSIS — M129 Arthropathy, unspecified: Secondary | ICD-10-CM

## 2015-06-20 DIAGNOSIS — K30 Functional dyspepsia: Secondary | ICD-10-CM

## 2015-06-20 LAB — CBC WITH DIFFERENTIAL/PLATELET
Basophils Absolute: 0 10*3/uL (ref 0–0.1)
Basophils Relative: 0 %
Eosinophils Absolute: 0 10*3/uL (ref 0–0.7)
Eosinophils Relative: 2 %
HEMATOCRIT: 40.8 % (ref 40.0–52.0)
HEMOGLOBIN: 13.7 g/dL (ref 13.0–18.0)
LYMPHS ABS: 0.3 10*3/uL — AB (ref 1.0–3.6)
LYMPHS PCT: 15 %
MCH: 32.6 pg (ref 26.0–34.0)
MCHC: 33.7 g/dL (ref 32.0–36.0)
MCV: 96.9 fL (ref 80.0–100.0)
MONOS PCT: 8 %
Monocytes Absolute: 0.2 10*3/uL (ref 0.2–1.0)
NEUTROS ABS: 1.5 10*3/uL (ref 1.4–6.5)
NEUTROS PCT: 75 %
Platelets: 46 10*3/uL — ABNORMAL LOW (ref 150–440)
RBC: 4.21 MIL/uL — AB (ref 4.40–5.90)
RDW: 14.2 % (ref 11.5–14.5)
WBC: 2 10*3/uL — AB (ref 3.8–10.6)

## 2015-06-20 NOTE — Progress Notes (Signed)
Patient is taking daily XRT for prostate cancer.

## 2015-06-21 ENCOUNTER — Ambulatory Visit
Admission: RE | Admit: 2015-06-21 | Discharge: 2015-06-21 | Disposition: A | Discharge status: Home / Self Care | Payer: PPO | Source: Ambulatory Visit | Attending: Radiation Oncology | Admitting: Radiation Oncology

## 2015-06-21 DIAGNOSIS — Z510 Encounter for antineoplastic radiation therapy: Secondary | ICD-10-CM | POA: Diagnosis not present

## 2015-06-22 ENCOUNTER — Inpatient Hospital Stay: Payer: PPO

## 2015-06-22 ENCOUNTER — Ambulatory Visit
Admission: RE | Admit: 2015-06-22 | Discharge: 2015-06-22 | Disposition: A | Discharge status: Home / Self Care | Payer: PPO | Source: Ambulatory Visit | Attending: Radiation Oncology | Admitting: Radiation Oncology

## 2015-06-22 DIAGNOSIS — Z510 Encounter for antineoplastic radiation therapy: Secondary | ICD-10-CM | POA: Diagnosis not present

## 2015-06-23 ENCOUNTER — Ambulatory Visit
Admission: RE | Admit: 2015-06-23 | Discharge: 2015-06-23 | Disposition: A | Discharge status: Home / Self Care | Payer: PPO | Source: Ambulatory Visit | Attending: Radiation Oncology | Admitting: Radiation Oncology

## 2015-06-23 DIAGNOSIS — Z510 Encounter for antineoplastic radiation therapy: Secondary | ICD-10-CM | POA: Diagnosis not present

## 2015-06-26 ENCOUNTER — Ambulatory Visit
Admission: RE | Admit: 2015-06-26 | Discharge: 2015-06-26 | Disposition: A | Discharge status: Home / Self Care | Payer: PPO | Source: Ambulatory Visit | Attending: Radiation Oncology | Admitting: Radiation Oncology

## 2015-06-26 DIAGNOSIS — Z510 Encounter for antineoplastic radiation therapy: Secondary | ICD-10-CM | POA: Diagnosis not present

## 2015-06-27 ENCOUNTER — Ambulatory Visit
Admission: RE | Admit: 2015-06-27 | Discharge: 2015-06-27 | Disposition: A | Discharge status: Home / Self Care | Payer: PPO | Source: Ambulatory Visit | Attending: Radiation Oncology | Admitting: Radiation Oncology

## 2015-06-27 DIAGNOSIS — Z510 Encounter for antineoplastic radiation therapy: Secondary | ICD-10-CM | POA: Diagnosis not present

## 2015-06-28 ENCOUNTER — Ambulatory Visit
Admission: RE | Admit: 2015-06-28 | Discharge: 2015-06-28 | Disposition: A | Discharge status: Home / Self Care | Payer: PPO | Source: Ambulatory Visit | Attending: Radiation Oncology | Admitting: Radiation Oncology

## 2015-06-28 DIAGNOSIS — Z510 Encounter for antineoplastic radiation therapy: Secondary | ICD-10-CM | POA: Diagnosis not present

## 2015-07-03 ENCOUNTER — Ambulatory Visit
Admission: RE | Admit: 2015-07-03 | Discharge: 2015-07-03 | Disposition: A | Discharge status: Home / Self Care | Payer: PPO | Source: Ambulatory Visit | Attending: Radiation Oncology | Admitting: Radiation Oncology

## 2015-07-03 DIAGNOSIS — Z510 Encounter for antineoplastic radiation therapy: Secondary | ICD-10-CM | POA: Diagnosis not present

## 2015-07-04 ENCOUNTER — Ambulatory Visit
Admission: RE | Admit: 2015-07-04 | Discharge: 2015-07-04 | Disposition: A | Discharge status: Home / Self Care | Payer: PPO | Source: Ambulatory Visit | Attending: Radiation Oncology | Admitting: Radiation Oncology

## 2015-07-04 ENCOUNTER — Other Ambulatory Visit: Payer: Self-pay | Admitting: *Deleted

## 2015-07-04 DIAGNOSIS — C61 Malignant neoplasm of prostate: Secondary | ICD-10-CM

## 2015-07-04 DIAGNOSIS — Z510 Encounter for antineoplastic radiation therapy: Secondary | ICD-10-CM | POA: Diagnosis not present

## 2015-07-05 ENCOUNTER — Ambulatory Visit
Admission: RE | Admit: 2015-07-05 | Discharge: 2015-07-05 | Disposition: A | Discharge status: Home / Self Care | Payer: PPO | Source: Ambulatory Visit | Attending: Radiation Oncology | Admitting: Radiation Oncology

## 2015-07-05 DIAGNOSIS — Z510 Encounter for antineoplastic radiation therapy: Secondary | ICD-10-CM | POA: Diagnosis not present

## 2015-07-06 ENCOUNTER — Other Ambulatory Visit: Payer: Self-pay | Admitting: *Deleted

## 2015-07-06 ENCOUNTER — Inpatient Hospital Stay: Payer: PPO | Attending: Radiation Oncology

## 2015-07-06 ENCOUNTER — Ambulatory Visit
Admission: RE | Admit: 2015-07-06 | Discharge: 2015-07-06 | Disposition: A | Discharge status: Home / Self Care | Payer: PPO | Source: Ambulatory Visit | Attending: Radiation Oncology | Admitting: Radiation Oncology

## 2015-07-06 DIAGNOSIS — D696 Thrombocytopenia, unspecified: Secondary | ICD-10-CM | POA: Diagnosis present

## 2015-07-06 DIAGNOSIS — R319 Hematuria, unspecified: Secondary | ICD-10-CM

## 2015-07-06 DIAGNOSIS — Z510 Encounter for antineoplastic radiation therapy: Secondary | ICD-10-CM | POA: Diagnosis not present

## 2015-07-06 LAB — URINALYSIS COMPLETE WITH MICROSCOPIC (ARMC ONLY)
Bilirubin Urine: NEGATIVE
GLUCOSE, UA: NEGATIVE mg/dL
Ketones, ur: NEGATIVE mg/dL
Leukocytes, UA: NEGATIVE
Nitrite: NEGATIVE
Protein, ur: 100 mg/dL — AB
Specific Gravity, Urine: 1.019 (ref 1.005–1.030)
pH: 5 (ref 5.0–8.0)

## 2015-07-06 MED ORDER — CIPROFLOXACIN HCL 250 MG PO TABS: 250.0000 mg | ORAL_TABLET | Freq: Two times a day (BID) | ORAL | Status: DC

## 2015-07-06 MED ORDER — CIPROFLOXACIN HCL 500 MG PO TABS: 500.0000 mg | ORAL_TABLET | Freq: Two times a day (BID) | ORAL | Status: DC

## 2015-07-07 ENCOUNTER — Ambulatory Visit: Payer: PPO

## 2015-07-07 ENCOUNTER — Ambulatory Visit
Admission: RE | Admit: 2015-07-07 | Discharge: 2015-07-07 | Disposition: A | Discharge status: Home / Self Care | Payer: PPO | Source: Ambulatory Visit | Attending: Radiation Oncology | Admitting: Radiation Oncology

## 2015-07-07 DIAGNOSIS — Z510 Encounter for antineoplastic radiation therapy: Secondary | ICD-10-CM | POA: Diagnosis not present

## 2015-07-07 NOTE — Progress Notes (Signed)
[facility]  [phone] [phone]  ID: Earl Ramirez OB: 10/27/1940  MR#: [id number]  CSN#:[id number]  Patient Care Team: David Fitzgerald, MD as PCP - General (Infectious Diseases) Brian S Cope, MD (Urology) Kenneth A Fath, MD (Cardiology)  CHIEF COMPLAINT:  Chief Complaint  Patient presents with  . thrombocytopenia    INTERVAL HISTORY: Patient returns to clinic today for further evaluation and repeat laboratory work. He continues with his daily XRT for his prostate cancer. He continues to feel well and remains asymptomatic. He has no neurologic complaints. He denies any recent fevers or illnesses. He has a good appetite and denies weight loss. He denies any easy bleeding or bruising. He denies any chest pain or shortness of breath. He denies any nausea, vomiting, constipation, or diarrhea. He has no urinary complaints. Patient feels at his baseline and offers no specific complaints today.  REVIEW OF SYSTEMS:   Review of Systems  Constitutional: Negative.   Respiratory: Negative.   Cardiovascular: Negative.   Gastrointestinal: Negative.   Genitourinary: Negative.   Musculoskeletal: Negative.   Neurological: Negative.   Endo/Heme/Allergies: Does not bruise/bleed easily.    As per HPI. Otherwise, a complete review of systems is negatve.  PAST MEDICAL HISTORY: Past Medical History  Diagnosis Date  . Atrial fibrillation   . Arthritis   . COPD (chronic obstructive pulmonary disease)   . Sleep apnea   . Neuropathy   . Hernia of abdominal wall   . Indigestion   . Aortic valve replaced     1998  . Prostate cancer   . Tremors of nervous system   . Thrombocytopenia     PAST SURGICAL HISTORY: Past Surgical History  Procedure Laterality Date  . Caldwell luc      FAMILY HISTORY: Reviewed and unchanged. No reported history of malignancy or chronic disease.     ADVANCED DIRECTIVES:       HEALTH MAINTENANCE: Social History    Substance Use Topics  . Smoking status: Former Smoker    Types: Cigarettes  . Smokeless tobacco: Former User    Quit date: 09/24/2000  . Alcohol Use: No     Colonoscopy:  PAP:  Bone density:  Lipid panel:  Allergies  Allergen Reactions  . Oxycodone-Acetaminophen Nausea Only and Shortness Of Breath  . Amoxicillin Itching    Hand itching  . Penicillins Rash    Current Outpatient Prescriptions  Medication Sig Dispense Refill  . albuterol (PROVENTIL HFA;VENTOLIN HFA) 108 (90 BASE) MCG/ACT inhaler Inhale into the lungs.    . aspirin EC 81 MG tablet Take 81 mg by mouth.    . folic acid (FOLVITE) 1 MG tablet Take 1 mg by mouth.    . gabapentin (NEURONTIN) 300 MG capsule Take 600 mg by mouth 2 (two) times daily.     . HYDROcodone-acetaminophen (NORCO/VICODIN) 5-325 MG per tablet Take by mouth.    . metoprolol succinate (TOPROL-XL) 25 MG 24 hr tablet Take by mouth.    . phenazopyridine (PYRIDIUM) 200 MG tablet Take 1 tablet (200 mg total) by mouth 3 (three) times daily as needed for pain. 90 tablet 2  . tamsulosin (FLOMAX) 0.4 MG CAPS capsule Take 0.4 mg by mouth 2 (two) times daily.     . ciprofloxacin (CIPRO) 250 MG tablet Take 1 tablet (250 mg total) by mouth 2 (two) times daily. 6 tablet 0   No current facility-administered medications for this visit.    OBJECTIVE: Filed Vitals:   06/20/15 1419    BP: 114/71  Pulse: 89  Temp: 96.8 F (36 C)  Resp: 16     Body mass index is 33.01 kg/(m^2).    ECOG FS:0 - Asymptomatic  General: Well-developed, well-nourished, no acute distress. Eyes: Pink conjunctiva, anicteric sclera. Lungs: Clear to auscultation bilaterally. Heart: Regular rate and rhythm. No rubs, murmurs, or gallops. Abdomen: Soft, nontender, nondistended. No organomegaly noted, normoactive bowel sounds. Musculoskeletal: No edema, cyanosis, or clubbing. Neuro: Alert, answering all questions appropriately. Cranial nerves grossly intact. Skin: No rashes or petechiae  noted. Psych: Normal affect.   LAB RESULTS:  No results found for: NA, K, CL, CO2, GLUCOSE, BUN, CREATININE, CALCIUM, PROT, ALBUMIN, AST, ALT, ALKPHOS, BILITOT, GFRNONAA, GFRAA  Lab Results  Component Value Date   WBC 2.0* 06/20/2015   NEUTROABS 1.5 06/20/2015   HGB 13.7 06/20/2015   HCT 40.8 06/20/2015   MCV 96.9 06/20/2015   PLT 46* 06/20/2015     STUDIES: No results found.  ASSESSMENT: Leukopenia and thrombocytopenia.  PLAN:   1.  Leukopenia and thrombocytopenia: Patient's platelet count and red cell count remains decreased, but relatively stable and unchanged for approximately 2 years.  The remainder of his laboratory work with either negative or within normal limits. Given his age, it is possible he has underlying MDS which would require a bone marrow biopsy to confirm, but this is not necessary at this point.  Abdominal ultrasound on Dec 27, 2014 revealed mild splenomegaly. This is possibly contributing, but does not fully explain his cytopenias. If patient's counts declined further or he became symptomatic would consider a biopsy then.  No intervention is needed at this time.  Return to clinic in 3 months with repeat laboratory work and further evaluation.   2. Gastric cancer: Unclear stage. Continue XRT, completing on July 12, 2015.  Patient expressed understanding and was in agreement with this plan. He also understands that He can call clinic at any time with any questions, concerns, or complaints.   No matching staging information was found for the patient.  Lloyd Huger, MD   07/07/2015 10:35 AM

## 2015-07-08 LAB — URINE CULTURE

## 2015-07-10 ENCOUNTER — Ambulatory Visit
Admission: RE | Admit: 2015-07-10 | Discharge: 2015-07-10 | Disposition: A | Discharge status: Home / Self Care | Payer: PPO | Source: Ambulatory Visit | Attending: Radiation Oncology | Admitting: Radiation Oncology

## 2015-07-10 DIAGNOSIS — Z510 Encounter for antineoplastic radiation therapy: Secondary | ICD-10-CM | POA: Diagnosis not present

## 2015-07-11 ENCOUNTER — Ambulatory Visit: Payer: PPO

## 2015-07-11 ENCOUNTER — Ambulatory Visit
Admission: RE | Admit: 2015-07-11 | Discharge: 2015-07-11 | Disposition: A | Discharge status: Home / Self Care | Payer: PPO | Source: Ambulatory Visit | Attending: Radiation Oncology | Admitting: Radiation Oncology

## 2015-07-11 DIAGNOSIS — Z510 Encounter for antineoplastic radiation therapy: Secondary | ICD-10-CM | POA: Diagnosis not present

## 2015-07-12 ENCOUNTER — Ambulatory Visit
Admission: RE | Admit: 2015-07-12 | Discharge: 2015-07-12 | Disposition: A | Discharge status: Home / Self Care | Payer: PPO | Source: Ambulatory Visit | Attending: Radiation Oncology | Admitting: Radiation Oncology

## 2015-07-12 DIAGNOSIS — Z510 Encounter for antineoplastic radiation therapy: Secondary | ICD-10-CM | POA: Diagnosis not present

## 2015-08-18 ENCOUNTER — Ambulatory Visit: Payer: PPO | Attending: Radiation Oncology | Admitting: Radiation Oncology

## 2015-09-14 ENCOUNTER — Ambulatory Visit: Payer: PPO | Admitting: Radiation Oncology

## 2015-09-20 ENCOUNTER — Inpatient Hospital Stay: Payer: PPO | Attending: Oncology

## 2015-09-20 ENCOUNTER — Encounter: Payer: Self-pay | Admitting: Radiation Oncology

## 2015-09-20 ENCOUNTER — Inpatient Hospital Stay (HOSPITAL_BASED_OUTPATIENT_CLINIC_OR_DEPARTMENT_OTHER): Payer: PPO | Admitting: Oncology

## 2015-09-20 ENCOUNTER — Ambulatory Visit
Admission: RE | Admit: 2015-09-20 | Discharge: 2015-09-20 | Disposition: A | Discharge status: Home / Self Care | Payer: PPO | Source: Ambulatory Visit | Attending: Radiation Oncology | Admitting: Radiation Oncology

## 2015-09-20 ENCOUNTER — Other Ambulatory Visit: Payer: Self-pay | Admitting: *Deleted

## 2015-09-20 VITALS — BP 130/78 | HR 76 | Temp 96.9°F | Ht 68.4 in | Wt 221.1 lb

## 2015-09-20 VITALS — BP 149/87 | HR 85 | Temp 96.9°F | Resp 18 | Wt 225.0 lb

## 2015-09-20 DIAGNOSIS — R161 Splenomegaly, not elsewhere classified: Secondary | ICD-10-CM

## 2015-09-20 DIAGNOSIS — G4730 Sleep apnea, unspecified: Secondary | ICD-10-CM | POA: Diagnosis not present

## 2015-09-20 DIAGNOSIS — D72819 Decreased white blood cell count, unspecified: Secondary | ICD-10-CM | POA: Insufficient documentation

## 2015-09-20 DIAGNOSIS — J449 Chronic obstructive pulmonary disease, unspecified: Secondary | ICD-10-CM

## 2015-09-20 DIAGNOSIS — I4891 Unspecified atrial fibrillation: Secondary | ICD-10-CM

## 2015-09-20 DIAGNOSIS — Z923 Personal history of irradiation: Secondary | ICD-10-CM | POA: Insufficient documentation

## 2015-09-20 DIAGNOSIS — Z79899 Other long term (current) drug therapy: Secondary | ICD-10-CM | POA: Insufficient documentation

## 2015-09-20 DIAGNOSIS — K449 Diaphragmatic hernia without obstruction or gangrene: Secondary | ICD-10-CM | POA: Diagnosis not present

## 2015-09-20 DIAGNOSIS — Z7982 Long term (current) use of aspirin: Secondary | ICD-10-CM | POA: Diagnosis not present

## 2015-09-20 DIAGNOSIS — Z87891 Personal history of nicotine dependence: Secondary | ICD-10-CM | POA: Insufficient documentation

## 2015-09-20 DIAGNOSIS — C61 Malignant neoplasm of prostate: Secondary | ICD-10-CM

## 2015-09-20 DIAGNOSIS — G629 Polyneuropathy, unspecified: Secondary | ICD-10-CM | POA: Diagnosis not present

## 2015-09-20 DIAGNOSIS — M129 Arthropathy, unspecified: Secondary | ICD-10-CM | POA: Insufficient documentation

## 2015-09-20 DIAGNOSIS — D696 Thrombocytopenia, unspecified: Secondary | ICD-10-CM | POA: Insufficient documentation

## 2015-09-20 LAB — CBC
HCT: 39.5 % — ABNORMAL LOW (ref 40.0–52.0)
Hemoglobin: 13.5 g/dL (ref 13.0–18.0)
MCH: 34 pg (ref 26.0–34.0)
MCHC: 34.2 g/dL (ref 32.0–36.0)
MCV: 99.3 fL (ref 80.0–100.0)
PLATELETS: 46 10*3/uL — AB (ref 150–440)
RBC: 3.98 MIL/uL — ABNORMAL LOW (ref 4.40–5.90)
RDW: 13.4 % (ref 11.5–14.5)
WBC: 2.2 10*3/uL — AB (ref 3.8–10.6)

## 2015-09-20 NOTE — Progress Notes (Signed)
Radiation Oncology Follow up Note  Name: Earl Ramirez   Date:   09/20/2015 MRN:  [id number] DOB: 11/18/1940    This 75 y.o. male presents to the clinic today for follow-up for prostate cancer 1 month outIMRT radiation.  REFERRING PROVIDER: Adrian Prows, MD  HPI: Patient is a 75 year old male now one month out having completed radiation therapy to his prostate and pelvic nodes for a Gleason 8 (4+4) adenocarcinoma the prostate presenting the PSA in the 19 range. Patient had been on Lupron a continues on that [facility] agonist. He is doing well specifically denies urinary frequency urgency or nocturia actually his lower urinary tract symptoms have improved significantly. He does have occasional intermittent diarrhea does not take Imodium..  COMPLICATIONS OF TREATMENT: none  FOLLOW UP COMPLIANCE: keeps appointments   PHYSICAL EXAM:  BP 149/87 mmHg  Pulse 85  Temp(Src) 96.9 F (36.1 C)  Resp 18  Wt 224 lb 15.7 oz (102.05 kg) On rectal exam rectal sphincter tone is good. Prostate is smooth contracted without evidence of nodularity or mass. Sulcus is preserved bilaterally. No discrete nodularity is identified. No other rectal abnormalities are noted. Well-developed well-nourished patient in NAD. HEENT reveals PERLA, EOMI, discs not visualized.  Oral cavity is clear. No oral mucosal lesions are identified. Neck is clear without evidence of cervical or supraclavicular adenopathy. Lungs are clear to A&P. Cardiac examination is essentially unremarkable with regular rate and rhythm without murmur rub or thrill. Abdomen is benign with no organomegaly or masses noted. Motor sensory and DTR levels are equal and symmetric in the upper and lower extremities. Cranial nerves II through XII are grossly intact. Proprioception is intact. No peripheral adenopathy or edema is identified. No motor or sensory levels are noted. Crude visual fields are within normal range.  RADIOLOGY RESULTS: No current films for  review  PLAN: Present time patient is recovering nicely from his radiation therapy treatments. I'm please was overall progress. I've asked to see him back in 3-4 months at which time I will obtain a PSA. I have explained the PSA protocol to the patient. Otherwise he is doing well knows to call sooner with any concerns.  I would like to take this opportunity for allowing me to participate in the care of your patient.Armstead Peaks., MD

## 2015-09-20 NOTE — Progress Notes (Signed)
[place]  Telephone:(336) [phone] Fax:(336) [phone]  ID: Earl Ramirez OB: Oct 28, 1940  MR#: [id number]  YME#:[id number]  Patient Care Team: Adrian Prows, MD as PCP - General (Infectious Diseases) Murrell Redden, MD (Urology) Teodoro Spray, MD (Cardiology)  CHIEF COMPLAINT:  Chief Complaint  Patient presents with  . Follow-up    INTERVAL HISTORY: Patient returns to clinic today for further evaluation and repeat laboratory work. He continues to feel well and remains asymptomatic. He does have peripheral neuropathy but Dr Ola Spurr is monitoring.  He denies any recent fevers or illnesses. He has a good appetite and denies weight loss. He denies any easy bleeding or bruising. He denies any chest pain or shortness of breath. He denies any nausea, vomiting, constipation, or diarrhea. He has no urinary complaints. Patient feels at his baseline and offers no specific complaints today.  REVIEW OF SYSTEMS:   Review of Systems  Constitutional: Negative.   Respiratory: Negative.   Cardiovascular: Negative.   Gastrointestinal: Negative.   Genitourinary: Negative.   Musculoskeletal: Negative.   Neurological: Positive for sensory change.  Endo/Heme/Allergies: Does not bruise/bleed easily.    As per HPI. Otherwise, a complete review of systems is negatve.  PAST MEDICAL HISTORY: Past Medical History  Diagnosis Date  . Atrial fibrillation ([place])   . Arthritis   . COPD (chronic obstructive pulmonary disease) ([place])   . Sleep apnea   . Neuropathy ([place])   . Hernia of abdominal wall   . Indigestion   . Aortic valve replaced     1998  . Prostate cancer ([place])   . Tremors of nervous system   . Thrombocytopenia ([place])     PAST SURGICAL HISTORY: Past Surgical History  Procedure Laterality Date  . [place] luc      FAMILY HISTORY: Reviewed and unchanged. No reported history of malignancy or chronic disease.     ADVANCED DIRECTIVES:       HEALTH  MAINTENANCE: Social History  Substance Use Topics  . Smoking status: Former Smoker    Types: Cigarettes  . Smokeless tobacco: Former Systems developer    Quit date: 09/24/2000  . Alcohol Use: No     Colonoscopy:  PAP:  Bone density:  Lipid panel:  Allergies  Allergen Reactions  . Oxycodone-Acetaminophen Nausea Only and Shortness Of Breath  . Amoxicillin Itching    Hand itching  . Penicillins Rash    Current Outpatient Prescriptions  Medication Sig Dispense Refill  . albuterol (PROVENTIL HFA;VENTOLIN HFA) 108 (90 BASE) MCG/ACT inhaler Inhale into the lungs.    . ALPRAZolam (XANAX) 0.25 MG tablet Take 0.25 mg by mouth.    Marland Kitchen aspirin EC 81 MG tablet Take 81 mg by mouth.    . ciprofloxacin (CIPRO) 250 MG tablet Take 1 tablet (250 mg total) by mouth 2 (two) times daily. 6 tablet 0  . folic acid (FOLVITE) 1 MG tablet Take 1 mg by mouth.    . gabapentin (NEURONTIN) 300 MG capsule Take 600 mg by mouth 2 (two) times daily.     Marland Kitchen HYDROcodone-acetaminophen (NORCO/VICODIN) 5-325 MG per tablet Take by mouth.    . metoprolol succinate (TOPROL-XL) 25 MG 24 hr tablet Take by mouth.    . phenazopyridine (PYRIDIUM) 200 MG tablet Take 1 tablet (200 mg total) by mouth 3 (three) times daily as needed for pain. (Patient not taking: Reported on 09/20/2015) 90 tablet 2  . tamsulosin (FLOMAX) 0.4 MG CAPS capsule Take 0.4 mg by mouth 2 (two) times daily.  No current facility-administered medications for this visit.    OBJECTIVE: Filed Vitals:   09/20/15 1428  BP: 130/78  Pulse: 76  Temp: 96.9 F (36.1 C)     Body mass index is 33.24 kg/(m^2).    ECOG FS:0 - Asymptomatic  General: Well-developed, well-nourished, no acute distress. Eyes: Pink conjunctiva, anicteric sclera. Lungs: Clear to auscultation bilaterally. Heart: Regular rate and rhythm. No rubs, murmurs, or gallops. Abdomen: Soft, nontender, nondistended. No organomegaly noted, normoactive bowel sounds. Musculoskeletal: No edema, cyanosis, or  clubbing. Neuro: Alert, answering all questions appropriately. Cranial nerves grossly intact. Skin: No rashes or petechiae noted. Psych: Normal affect.   LAB RESULTS:  No results found for: NA, K, CL, CO2, GLUCOSE, BUN, CREATININE, CALCIUM, PROT, ALBUMIN, AST, ALT, ALKPHOS, BILITOT, GFRNONAA, GFRAA  Lab Results  Component Value Date   WBC 2.2* 09/20/2015   NEUTROABS 1.5 06/20/2015   HGB 13.5 09/20/2015   HCT 39.5* 09/20/2015   MCV 99.3 09/20/2015   PLT 46* 09/20/2015   No results found for: PSA   STUDIES: No results found.  ASSESSMENT: Leukopenia and thrombocytopenia.  PLAN:   1.  Leukopenia and thrombocytopenia: Patient's platelet count and red cell count remains decreased, but relatively stable and unchanged for approximately 2 years.  The remainder of his laboratory work with either negative or within normal limits. Given his age, it is possible he has underlying MDS which would require a bone marrow biopsy to confirm, but this is not necessary at this point.  Abdominal ultrasound on Dec 27, 2014 revealed mild splenomegaly. This is possibly contributing, but does not fully explain his cytopenias. If patient's counts declined further or he became symptomatic would consider a biopsy then.  No intervention is needed at this time.  Return to clinic in 3 months with repeat laboratory work and then in 6 months for repeat laboratory work and further evaluation.   2. Prostate cancer: Unclear stage. XRT completed on July 12, 2015. Draw PSA at next visit.  Patient expressed understanding and was in agreement with this plan. He also understands that He can call clinic at any time with any questions, concerns, or complaints.    Mayra Reel, NP   09/20/2015 2:55 PM   Patient seen and evaluated independently and I agree with the assessment and plan as dictated above.  Lloyd Huger, MD 09/24/2015 7:46 AM

## 2015-09-20 NOTE — Progress Notes (Signed)
Patient ambulates independently without assistance.  Patient denies pain or discomfort, vitals stable and documented.  Medical record updated information provided by patient.

## 2015-11-04 DIAGNOSIS — J209 Acute bronchitis, unspecified: Secondary | ICD-10-CM | POA: Diagnosis not present

## 2015-12-05 DIAGNOSIS — D696 Thrombocytopenia, unspecified: Secondary | ICD-10-CM | POA: Diagnosis not present

## 2015-12-05 DIAGNOSIS — G4730 Sleep apnea, unspecified: Secondary | ICD-10-CM | POA: Diagnosis not present

## 2015-12-05 DIAGNOSIS — G609 Hereditary and idiopathic neuropathy, unspecified: Secondary | ICD-10-CM | POA: Diagnosis not present

## 2015-12-05 DIAGNOSIS — C61 Malignant neoplasm of prostate: Secondary | ICD-10-CM | POA: Diagnosis not present

## 2015-12-12 DIAGNOSIS — G609 Hereditary and idiopathic neuropathy, unspecified: Secondary | ICD-10-CM | POA: Diagnosis not present

## 2015-12-12 DIAGNOSIS — D61818 Other pancytopenia: Secondary | ICD-10-CM | POA: Diagnosis not present

## 2015-12-12 DIAGNOSIS — C61 Malignant neoplasm of prostate: Secondary | ICD-10-CM | POA: Diagnosis not present

## 2015-12-12 DIAGNOSIS — G4730 Sleep apnea, unspecified: Secondary | ICD-10-CM | POA: Diagnosis not present

## 2015-12-18 ENCOUNTER — Inpatient Hospital Stay: Payer: PPO | Attending: Oncology

## 2016-01-04 DIAGNOSIS — R0602 Shortness of breath: Secondary | ICD-10-CM | POA: Diagnosis not present

## 2016-01-04 DIAGNOSIS — J42 Unspecified chronic bronchitis: Secondary | ICD-10-CM | POA: Diagnosis not present

## 2016-01-04 DIAGNOSIS — I4892 Unspecified atrial flutter: Secondary | ICD-10-CM | POA: Diagnosis not present

## 2016-01-04 DIAGNOSIS — D696 Thrombocytopenia, unspecified: Secondary | ICD-10-CM | POA: Diagnosis not present

## 2016-01-04 DIAGNOSIS — G4733 Obstructive sleep apnea (adult) (pediatric): Secondary | ICD-10-CM | POA: Diagnosis not present

## 2016-01-04 DIAGNOSIS — R0789 Other chest pain: Secondary | ICD-10-CM | POA: Diagnosis not present

## 2016-01-04 DIAGNOSIS — I4891 Unspecified atrial fibrillation: Secondary | ICD-10-CM | POA: Diagnosis not present

## 2016-01-17 ENCOUNTER — Inpatient Hospital Stay: Payer: PPO | Attending: Radiation Oncology

## 2016-01-17 ENCOUNTER — Encounter: Payer: Self-pay | Admitting: Radiation Oncology

## 2016-01-17 ENCOUNTER — Ambulatory Visit
Admission: RE | Admit: 2016-01-17 | Discharge: 2016-01-17 | Disposition: A | Discharge status: Home / Self Care | Payer: PPO | Source: Ambulatory Visit | Attending: Radiation Oncology | Admitting: Radiation Oncology

## 2016-01-17 VITALS — BP 141/81 | HR 81 | Temp 97.3°F | Wt 231.4 lb

## 2016-01-17 DIAGNOSIS — Z08 Encounter for follow-up examination after completed treatment for malignant neoplasm: Secondary | ICD-10-CM | POA: Insufficient documentation

## 2016-01-17 DIAGNOSIS — Z79818 Long term (current) use of other agents affecting estrogen receptors and estrogen levels: Secondary | ICD-10-CM | POA: Insufficient documentation

## 2016-01-17 DIAGNOSIS — Z923 Personal history of irradiation: Secondary | ICD-10-CM | POA: Diagnosis not present

## 2016-01-17 DIAGNOSIS — C61 Malignant neoplasm of prostate: Secondary | ICD-10-CM | POA: Insufficient documentation

## 2016-01-17 LAB — PSA: PSA: 0.12 ng/mL (ref 0.00–4.00)

## 2016-01-17 NOTE — Progress Notes (Signed)
Radiation Oncology Follow up Note  Name: Earl Ramirez   Date:   01/17/2016 MRN:  [id number] DOB: 30-Dec-1940    This 75 y.o. male presents to the clinic today for follow-up for. Prostate cancer now out 5 months  REFERRING PROVIDER: Leonel Ramsay, MD  HPI: Patient is a 1 rolled male now out 5 months having completed IM RT radiation therapy for a Gleason 8 (4+4) adenocarcinoma presenting the PSA in the 19 range. Patient continues on Lupron therapy. He is doing well has some urgency slight frequency and nocturia 1 taking no medication for that. We have run a PSA level on him today and will report that separately..  COMPLICATIONS OF TREATMENT: none  FOLLOW UP COMPLIANCE: keeps appointments   PHYSICAL EXAM:  BP 141/81 mmHg  Pulse 81  Temp(Src) 97.3 F (36.3 C)  Wt 231 lb 6 oz (104.95 kg) On rectal exam rectal sphincter tone is good. Prostate is smooth contracted without evidence of nodularity or mass. Sulcus is preserved bilaterally. No discrete nodularity is identified. No other rectal abnormalities are noted. Well-developed well-nourished patient in NAD. HEENT reveals PERLA, EOMI, discs not visualized.  Oral cavity is clear. No oral mucosal lesions are identified. Neck is clear without evidence of cervical or supraclavicular adenopathy. Lungs are clear to A&P. Cardiac examination is essentially unremarkable with regular rate and rhythm without murmur rub or thrill. Abdomen is benign with no organomegaly or masses noted. Motor sensory and DTR levels are equal and symmetric in the upper and lower extremities. Cranial nerves II through XII are grossly intact. Proprioception is intact. No peripheral adenopathy or edema is identified. No motor or sensory levels are noted. Crude visual fields are within normal range.  RADIOLOGY RESULTS: No current films for review  PLAN: Present time patient is doing well. Will report his PSA separately. I've asked to see him back in 6 months for follow-up.  Of explained to him expected PSA findings at this time.  I would like to take this opportunity to thank you for allowing me to participate in the care of your patient.Armstead Peaks., MD

## 2016-02-12 DIAGNOSIS — M25562 Pain in left knee: Secondary | ICD-10-CM | POA: Diagnosis not present

## 2016-02-12 DIAGNOSIS — M79605 Pain in left leg: Secondary | ICD-10-CM | POA: Diagnosis not present

## 2016-02-12 DIAGNOSIS — M170 Bilateral primary osteoarthritis of knee: Secondary | ICD-10-CM | POA: Diagnosis not present

## 2016-02-12 DIAGNOSIS — M79604 Pain in right leg: Secondary | ICD-10-CM | POA: Insufficient documentation

## 2016-02-12 DIAGNOSIS — G8929 Other chronic pain: Secondary | ICD-10-CM | POA: Insufficient documentation

## 2016-02-12 DIAGNOSIS — M25561 Pain in right knee: Secondary | ICD-10-CM | POA: Diagnosis not present

## 2016-02-12 DIAGNOSIS — C61 Malignant neoplasm of prostate: Secondary | ICD-10-CM | POA: Diagnosis not present

## 2016-02-12 DIAGNOSIS — D61818 Other pancytopenia: Secondary | ICD-10-CM | POA: Diagnosis not present

## 2016-02-12 DIAGNOSIS — G609 Hereditary and idiopathic neuropathy, unspecified: Secondary | ICD-10-CM | POA: Diagnosis not present

## 2016-02-13 ENCOUNTER — Other Ambulatory Visit: Payer: Self-pay | Admitting: Internal Medicine

## 2016-02-13 DIAGNOSIS — M79605 Pain in left leg: Principal | ICD-10-CM

## 2016-02-13 DIAGNOSIS — M79604 Pain in right leg: Secondary | ICD-10-CM

## 2016-02-17 ENCOUNTER — Ambulatory Visit: Payer: PPO

## 2016-03-19 ENCOUNTER — Ambulatory Visit: Payer: PPO | Admitting: Oncology

## 2016-03-19 ENCOUNTER — Other Ambulatory Visit: Payer: PPO

## 2016-04-17 ENCOUNTER — Ambulatory Visit: Payer: PPO | Admitting: Oncology

## 2016-04-17 ENCOUNTER — Other Ambulatory Visit: Payer: PPO

## 2016-06-06 ENCOUNTER — Inpatient Hospital Stay: Payer: PPO

## 2016-06-06 ENCOUNTER — Inpatient Hospital Stay: Payer: PPO | Admitting: Oncology

## 2016-06-13 ENCOUNTER — Inpatient Hospital Stay: Payer: PPO | Attending: Oncology

## 2016-06-13 ENCOUNTER — Inpatient Hospital Stay (HOSPITAL_BASED_OUTPATIENT_CLINIC_OR_DEPARTMENT_OTHER): Payer: PPO | Admitting: Oncology

## 2016-06-13 VITALS — BP 133/73 | HR 78 | Temp 98.0°F | Resp 18 | Wt 231.6 lb

## 2016-06-13 DIAGNOSIS — G8929 Other chronic pain: Secondary | ICD-10-CM

## 2016-06-13 DIAGNOSIS — R251 Tremor, unspecified: Secondary | ICD-10-CM | POA: Insufficient documentation

## 2016-06-13 DIAGNOSIS — Z7982 Long term (current) use of aspirin: Secondary | ICD-10-CM | POA: Diagnosis not present

## 2016-06-13 DIAGNOSIS — K439 Ventral hernia without obstruction or gangrene: Secondary | ICD-10-CM | POA: Insufficient documentation

## 2016-06-13 DIAGNOSIS — C61 Malignant neoplasm of prostate: Secondary | ICD-10-CM

## 2016-06-13 DIAGNOSIS — J449 Chronic obstructive pulmonary disease, unspecified: Secondary | ICD-10-CM | POA: Diagnosis not present

## 2016-06-13 DIAGNOSIS — I4891 Unspecified atrial fibrillation: Secondary | ICD-10-CM | POA: Diagnosis not present

## 2016-06-13 DIAGNOSIS — K30 Functional dyspepsia: Secondary | ICD-10-CM | POA: Diagnosis not present

## 2016-06-13 DIAGNOSIS — D696 Thrombocytopenia, unspecified: Secondary | ICD-10-CM

## 2016-06-13 DIAGNOSIS — M129 Arthropathy, unspecified: Secondary | ICD-10-CM

## 2016-06-13 DIAGNOSIS — D72819 Decreased white blood cell count, unspecified: Secondary | ICD-10-CM | POA: Insufficient documentation

## 2016-06-13 DIAGNOSIS — G4730 Sleep apnea, unspecified: Secondary | ICD-10-CM | POA: Insufficient documentation

## 2016-06-13 DIAGNOSIS — Z79899 Other long term (current) drug therapy: Secondary | ICD-10-CM | POA: Diagnosis not present

## 2016-06-13 DIAGNOSIS — Z87891 Personal history of nicotine dependence: Secondary | ICD-10-CM

## 2016-06-13 DIAGNOSIS — G629 Polyneuropathy, unspecified: Secondary | ICD-10-CM | POA: Diagnosis not present

## 2016-06-13 DIAGNOSIS — M25561 Pain in right knee: Secondary | ICD-10-CM | POA: Insufficient documentation

## 2016-06-13 LAB — CBC WITH DIFFERENTIAL/PLATELET
Basophils Absolute: 0 10*3/uL (ref 0–0.1)
Basophils Relative: 0 %
EOS PCT: 2 %
Eosinophils Absolute: 0 10*3/uL (ref 0–0.7)
HEMATOCRIT: 39.6 % — AB (ref 40.0–52.0)
Hemoglobin: 13.4 g/dL (ref 13.0–18.0)
LYMPHS ABS: 0.6 10*3/uL — AB (ref 1.0–3.6)
LYMPHS PCT: 22 %
MCH: 33.6 pg (ref 26.0–34.0)
MCHC: 33.9 g/dL (ref 32.0–36.0)
MCV: 99 fL (ref 80.0–100.0)
MONO ABS: 0.3 10*3/uL (ref 0.2–1.0)
Monocytes Relative: 11 %
Neutro Abs: 1.7 10*3/uL (ref 1.4–6.5)
Neutrophils Relative %: 65 %
PLATELETS: 51 10*3/uL — AB (ref 150–440)
RBC: 4 MIL/uL — ABNORMAL LOW (ref 4.40–5.90)
RDW: 14 % (ref 11.5–14.5)
WBC: 2.7 10*3/uL — ABNORMAL LOW (ref 3.8–10.6)

## 2016-06-13 NOTE — Progress Notes (Signed)
Complains of mild pain in right knee due to arthritis. Has chronic peripheral neuropathy.

## 2016-06-18 DIAGNOSIS — D72819 Decreased white blood cell count, unspecified: Secondary | ICD-10-CM | POA: Insufficient documentation

## 2016-06-18 NOTE — Progress Notes (Signed)
[place]  Telephone:(336) [phone] Fax:(336) [phone]  ID: Earl Ramirez OB: [birth year]  MR#: [id number]  RFF#:[id number]  Patient Care Team: Leonel Ramsay, MD as PCP - General (Infectious Diseases) Murrell Redden, MD (Urology) Teodoro Spray, MD (Cardiology)  CHIEF COMPLAINT:  Chief Complaint  Patient presents with  . Follow-up    thrombocytopenia    INTERVAL HISTORY: Patient returns to clinic today for further evaluation and repeat laboratory work. He continues to feel well and remains asymptomatic. He Continues to have chronic right knee pain and peripheral neuropathy.  He denies any recent fevers or illnesses. He has a good appetite and denies weight loss. He denies any easy bleeding or bruising. He denies any chest pain or shortness of breath. He denies any nausea, vomiting, constipation, or diarrhea. He has no urinary complaints. Patient feels at his baseline and offers no specific complaints today.  REVIEW OF SYSTEMS:   Review of Systems  Constitutional: Negative.  Negative for fever, malaise/fatigue and weight loss.  Respiratory: Negative.  Negative for cough and shortness of breath.   Cardiovascular: Negative.  Negative for chest pain.  Gastrointestinal: Negative.  Negative for abdominal pain.  Genitourinary: Negative.   Musculoskeletal: Positive for joint pain.  Neurological: Positive for sensory change. Negative for weakness.  Endo/Heme/Allergies: Does not bruise/bleed easily.  Psychiatric/Behavioral: Negative.  The patient is not nervous/anxious.     As per HPI. Otherwise, a complete review of systems is negative.  PAST MEDICAL HISTORY: Past Medical History:  Diagnosis Date  . Aortic valve replaced    1998  . Arthritis   . Atrial fibrillation ([place])   . COPD (chronic obstructive pulmonary disease) ([place])   . Hernia of abdominal wall   . Indigestion   . Neuropathy ([place])   . Prostate cancer ([place])   . Sleep apnea   . Thrombocytopenia  ([place])   . Tremors of nervous system     PAST SURGICAL HISTORY: Past Surgical History:  Procedure Laterality Date  . CALDWELL LUC      FAMILY HISTORY: Reviewed and unchanged. No reported history of malignancy or chronic disease.     ADVANCED DIRECTIVES:       HEALTH MAINTENANCE: Social History  Substance Use Topics  . Smoking status: Former Smoker    Types: Cigarettes  . Smokeless tobacco: Former Systems developer    Quit date: 09/24/2000  . Alcohol use No     Colonoscopy:  PAP:  Bone density:  Lipid panel:  Allergies  Allergen Reactions  . Oxycodone-Acetaminophen Nausea Only and Shortness Of Breath  . Amoxicillin Itching    Hand itching  . Penicillins Rash    Current Outpatient Prescriptions  Medication Sig Dispense Refill  . albuterol (PROVENTIL HFA;VENTOLIN HFA) 108 (90 BASE) MCG/ACT inhaler Inhale into the lungs.    . ALPRAZolam (XANAX) 0.25 MG tablet Take 0.25 mg by mouth daily as needed.     Marland Kitchen aspirin EC 81 MG tablet Take 81 mg by mouth.    . gabapentin (NEURONTIN) 300 MG capsule Take 600 mg by mouth 2 (two) times daily.     . metoprolol succinate (TOPROL-XL) 25 MG 24 hr tablet Take 25 mg by mouth daily.     . tamsulosin (FLOMAX) 0.4 MG CAPS capsule Take 0.4 mg by mouth 2 (two) times daily.      No current facility-administered medications for this visit.     OBJECTIVE: Vitals:   06/13/16 1520  BP: 133/73  Pulse: 78  Resp: 18  Temp: 98 F (36.7 C)     Body mass index is 34.8 kg/m.    ECOG FS:0 - Asymptomatic  General: Well-developed, well-nourished, no acute distress. Eyes: Pink conjunctiva, anicteric sclera. Lungs: Clear to auscultation bilaterally. Heart: Regular rate and rhythm. No rubs, murmurs, or gallops. Abdomen: Soft, nontender, nondistended. No organomegaly noted, normoactive bowel sounds. Musculoskeletal: No edema, cyanosis, or clubbing. Neuro: Alert, answering all questions appropriately. Cranial nerves grossly intact. Skin: No rashes or  petechiae noted. Psych: Normal affect.   LAB RESULTS:  No results found for: NA, K, CL, CO2, GLUCOSE, BUN, CREATININE, CALCIUM, PROT, ALBUMIN, AST, ALT, ALKPHOS, BILITOT, GFRNONAA, GFRAA  Lab Results  Component Value Date   WBC 2.7 (L) 06/13/2016   NEUTROABS 1.7 06/13/2016   HGB 13.4 06/13/2016   HCT 39.6 (L) 06/13/2016   MCV 99.0 06/13/2016   PLT 51 (L) 06/13/2016   Lab Results  Component Value Date   PSA 0.12 01/17/2016     STUDIES: No results found.  ASSESSMENT: Leukopenia and thrombocytopenia.  PLAN:   1.  Leukopenia: Patient's white blood cell count continues to be decreased, but relatively stable and unchanged for approximately 2 years. Previously, the remainder of his laboratory work was also either negative or within normal limits. Given his age, it is possible he has underlying MDS which would require a bone marrow biopsy to confirm, but this is not necessary at this point. If patient's counts declined further or he became symptomatic would consider a biopsy then.  No intervention is needed at this time.  Return to clinic in 6 months for repeat laboratory work and further evaluation.   2. Thrombocytopenia: Platelet count continues to be decreased, but stable as well. Abdominal ultrasound on Dec 27, 2014 revealed mild splenomegaly. This is possibly contributing, but does not fully explain his cytopenias. Follow-up as above. 2. Prostate cancer: Unclear stage. XRT completed on July 12, 2015. Patient's most recent PSA as above.   Patient expressed understanding and was in agreement with this plan. He also understands that He can call clinic at any time with any questions, concerns, or complaints.    Lloyd Huger, MD   06/18/2016 1:01 PM   Patient seen and evaluated independently and I agree with the assessment and plan as dictated above.  Lloyd Huger, MD 06/18/16 1:01 PM

## 2016-08-07 ENCOUNTER — Other Ambulatory Visit: Payer: Self-pay | Admitting: *Deleted

## 2016-08-07 DIAGNOSIS — C61 Malignant neoplasm of prostate: Secondary | ICD-10-CM

## 2016-08-08 ENCOUNTER — Inpatient Hospital Stay: Payer: PPO

## 2016-08-08 ENCOUNTER — Ambulatory Visit: Payer: PPO | Admitting: Radiation Oncology

## 2016-09-19 DIAGNOSIS — J019 Acute sinusitis, unspecified: Secondary | ICD-10-CM | POA: Diagnosis not present

## 2016-09-19 DIAGNOSIS — R05 Cough: Secondary | ICD-10-CM | POA: Diagnosis not present

## 2016-10-02 DIAGNOSIS — R0789 Other chest pain: Secondary | ICD-10-CM | POA: Diagnosis not present

## 2016-10-02 DIAGNOSIS — I4892 Unspecified atrial flutter: Secondary | ICD-10-CM | POA: Diagnosis not present

## 2016-10-02 DIAGNOSIS — D696 Thrombocytopenia, unspecified: Secondary | ICD-10-CM | POA: Diagnosis not present

## 2016-10-02 DIAGNOSIS — I482 Chronic atrial fibrillation: Secondary | ICD-10-CM | POA: Diagnosis not present

## 2016-10-02 DIAGNOSIS — J42 Unspecified chronic bronchitis: Secondary | ICD-10-CM | POA: Diagnosis not present

## 2016-10-21 ENCOUNTER — Other Ambulatory Visit: Payer: Self-pay

## 2016-10-31 ENCOUNTER — Other Ambulatory Visit: Payer: Self-pay

## 2016-12-10 ENCOUNTER — Encounter: Payer: Self-pay | Admitting: Urology

## 2016-12-10 ENCOUNTER — Ambulatory Visit (INDEPENDENT_AMBULATORY_CARE_PROVIDER_SITE_OTHER): Payer: PPO | Admitting: Urology

## 2016-12-10 VITALS — BP 145/81 | HR 108 | Ht 67.0 in | Wt 230.0 lb

## 2016-12-10 DIAGNOSIS — N529 Male erectile dysfunction, unspecified: Secondary | ICD-10-CM | POA: Diagnosis not present

## 2016-12-10 DIAGNOSIS — N138 Other obstructive and reflux uropathy: Secondary | ICD-10-CM

## 2016-12-10 DIAGNOSIS — C61 Malignant neoplasm of prostate: Secondary | ICD-10-CM

## 2016-12-10 DIAGNOSIS — R350 Frequency of micturition: Secondary | ICD-10-CM | POA: Diagnosis not present

## 2016-12-10 DIAGNOSIS — N401 Enlarged prostate with lower urinary tract symptoms: Secondary | ICD-10-CM | POA: Diagnosis not present

## 2016-12-10 MED ORDER — TAMSULOSIN HCL 0.4 MG PO CAPS: 0.4000 mg | ORAL_CAPSULE | Freq: Every day | ORAL | 11 refills | Status: DC

## 2016-12-10 MED ORDER — SILDENAFIL CITRATE 20 MG PO TABS: 20.0000 mg | ORAL_TABLET | ORAL | 11 refills | Status: DC | PRN

## 2016-12-10 NOTE — Progress Notes (Signed)
12/10/2016 5:04 PM   [name] 1940/10/21 [id number]  Referring provider: Leonel Ramsay, MD [address] [place], [place] [id number]  Chief Complaint  Patient presents with  . Establish Care    HPI: 76 year old male with a history of prostate cancer previously followed by Dr. Jacqlyn Larsen at [facility] urology who presents here today to establish care.  He was diagnosed with Gleason 4+4 prostate cancer in 11/2014 with PSA around that time was 19. He received one year of Lupron as well as external beam radiation by Dr. Donella Stade.  His most recent PSA was 0.12 on 01/2016.  He also has a history of BPH, incomplete bladder emptying and bladder stones. He underwent TURP and cystolitholapaxy in 01/2015.  He conitnues on flomax with good effect.    His stream is good.  He does feel like he is able to empty his bladder.  He does have some daytime frequency, 10-12x daily with mild occational urgency.  Nocturia 0-1x.   No dysuria or gross hematuria. No urinary tract infections.  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  he has difficulty both maintaining an achieving erections spontaneously. This is been going on for several years. He has tried Levitra in the remote past and experienced facial flushing. He does not use nitrates.  PMH: Past Medical History:  Diagnosis Date  . Aortic valve replaced    1998  . Arthritis   . Atrial fibrillation ([place])   . COPD (chronic obstructive pulmonary disease) ([place])   . Hernia of abdominal wall   . Indigestion   . Neuropathy   . Prostate cancer ([place])   . Sleep apnea   . Thrombocytopenia ([place])   . Tremors of nervous system     Surgical History: Past Surgical History:  Procedure Laterality Date  . CALDWELL LUC      Home Medications:  Allergies as of 12/10/2016      Reactions   Oxycodone-acetaminophen Nausea Only, Shortness Of Breath   Amoxicillin Itching   Hand itching   Penicillins Rash      Medication List       Accurate as of 12/10/16  5:04 PM. Always use  your most recent med list.          albuterol 108 (90 Base) MCG/ACT inhaler Commonly known as:  PROVENTIL HFA;VENTOLIN HFA Inhale into the lungs.   ALPRAZolam 0.25 MG tablet Commonly known as:  XANAX Take 0.25 mg by mouth daily as needed.   aspirin EC 81 MG tablet Take 81 mg by mouth.   gabapentin 300 MG capsule Commonly known as:  NEURONTIN Take 600 mg by mouth 2 (two) times daily.   gabapentin 300 MG capsule Commonly known as:  NEURONTIN Take 2 capsules by mouth 2 (two) times daily.   metoprolol succinate 25 MG 24 hr tablet Commonly known as:  TOPROL-XL Take 25 mg by mouth daily.   sildenafil 20 MG tablet Commonly known as:  REVATIO Take 1 tablet (20 mg total) by mouth as needed. Take 1-5 tabs as needed prior to intercourse   tamsulosin 0.4 MG Caps capsule Commonly known as:  FLOMAX Take 1 capsule (0.4 mg total) by mouth daily.       Allergies:  Allergies  Allergen Reactions  . Oxycodone-Acetaminophen Nausea Only and Shortness Of Breath  . Amoxicillin Itching    Hand itching  . Penicillins Rash    Family History: Family History  Problem Relation Age of Onset  . Heart attack Father   . Alcohol  abuse Father   . Congestive Heart Failure Mother   . Alzheimer's disease Sister     Social History:  reports that he has quit smoking. His smoking use included Cigarettes. He quit smokeless tobacco use about 16 years ago. He reports that he drinks about 1.2 oz of alcohol per week . He reports that he does not use drugs.  ROS: UROLOGY Frequent Urination?: Yes Hard to postpone urination?: No Burning/pain with urination?: No Get up at night to urinate?: Yes Leakage of urine?: No Urine stream starts and stops?: Yes Trouble starting stream?: No Do you have to strain to urinate?: No Blood in urine?: No Urinary tract infection?: No Sexually transmitted disease?: No Injury to kidneys or bladder?: No Painful intercourse?: No Weak stream?: No Erection problems?:  Yes Penile pain?: No  Gastrointestinal Nausea?: No Vomiting?: No Indigestion/heartburn?: No Diarrhea?: No Constipation?: Yes  Constitutional Fever: No Night sweats?: No Weight loss?: No Fatigue?: No  Skin Skin rash/lesions?: No Itching?: No  Eyes Blurred vision?: No Double vision?: No  Ears/Nose/Throat Sore throat?: Yes Sinus problems?: Yes  Hematologic/Lymphatic Swollen glands?: No Easy bruising?: Yes  Cardiovascular Leg swelling?: Yes Chest pain?: No  Respiratory Cough?: No Shortness of breath?: Yes  Endocrine Excessive thirst?: No  Musculoskeletal Back pain?: Yes Joint pain?: Yes  Neurological Headaches?: No Dizziness?: No  Psychologic Depression?: No Anxiety?: No  Physical Exam: BP (!) 145/81 (BP Location: Left Arm, Patient Position: Sitting, Cuff Size: Large)   Pulse (!) 108   Ht 5\' 7"  (1.702 m)   Wt 230 lb (104.3 kg)   BMI 36.02 kg/m   Constitutional:  Alert and oriented, No acute distress.  Ambulating with walker. HEENT: [place] AT, moist mucus membranes.  Trachea midline, no masses. Cardiovascular: No clubbing, cyanosis, or edema. Respiratory: Normal respiratory effort, no increased work of breathing. GI: Abdomen is soft, nontender, nondistended, no abdominal masses.  Obese. GU: No CVA tenderness. Skin: No rashes, bruises or suspicious lesions. Neurologic: Grossly intact, no focal deficits, moving all 4 extremities. Psychiatric: Normal mood and affect.  Laboratory Data: Lab Results  Component Value Date   WBC 2.7 (L) 06/13/2016   HGB 13.4 06/13/2016   HCT 39.6 (L) 06/13/2016   MCV 99.0 06/13/2016   PLT 51 (L) 06/13/2016    Lab Results  Component Value Date   PSA 0.12 01/17/2016    Urinalysis UA today completely negative, no evidence of laparoscopic blood.  Pertinent Imaging: n/a  Assessment & Plan:    1. Prostate cancer ([place]) Gleason 4+4 prostate cancer diagnosed 11/2014, status post EBRT/ Lupron x one year Recommend  PSA check every 6 months Nadir 0.12 - PSA  2. Erectile dysfunction, unspecified erectile dysfunction type We discussed the pathophysiology of erectile dysfunction today along with contributing factors. Discussed possible treatment options including PDE 5 inhibitors, vacuum erectile device, intracavernosal injection, MUSE, and placement of the inflatable or malleable penal prosthesis for refractory cases.  In terms of PDE 5 inhibitors, we discussed contraindications for this medication as well as common side effects. Patient was counseled on optimal use. All of his questions were answered in detail.  Sildenafil 20 mg, titrated to 100 mg as needed.  3. BPH with obstruction/lower urinary tract symptoms Continue Flomax  4. Urinary frequency Minimal bother - Urinalysis, Complete    Return in about 6 months (around 06/12/2017) for PSA, IPSS, PVR.  [name], MD  [facility] [address], [place], [place] [address] [phone]

## 2016-12-11 ENCOUNTER — Telehealth: Payer: Self-pay

## 2016-12-11 ENCOUNTER — Inpatient Hospital Stay: Payer: PPO | Attending: Oncology

## 2016-12-11 ENCOUNTER — Inpatient Hospital Stay (HOSPITAL_BASED_OUTPATIENT_CLINIC_OR_DEPARTMENT_OTHER): Payer: PPO | Admitting: Oncology

## 2016-12-11 VITALS — BP 121/78 | HR 90 | Temp 98.6°F | Resp 22 | Wt 236.6 lb

## 2016-12-11 DIAGNOSIS — K439 Ventral hernia without obstruction or gangrene: Secondary | ICD-10-CM | POA: Insufficient documentation

## 2016-12-11 DIAGNOSIS — G4730 Sleep apnea, unspecified: Secondary | ICD-10-CM

## 2016-12-11 DIAGNOSIS — M129 Arthropathy, unspecified: Secondary | ICD-10-CM | POA: Insufficient documentation

## 2016-12-11 DIAGNOSIS — C61 Malignant neoplasm of prostate: Secondary | ICD-10-CM | POA: Diagnosis not present

## 2016-12-11 DIAGNOSIS — G629 Polyneuropathy, unspecified: Secondary | ICD-10-CM | POA: Insufficient documentation

## 2016-12-11 DIAGNOSIS — J449 Chronic obstructive pulmonary disease, unspecified: Secondary | ICD-10-CM

## 2016-12-11 DIAGNOSIS — Z7982 Long term (current) use of aspirin: Secondary | ICD-10-CM | POA: Insufficient documentation

## 2016-12-11 DIAGNOSIS — D72819 Decreased white blood cell count, unspecified: Secondary | ICD-10-CM | POA: Diagnosis not present

## 2016-12-11 DIAGNOSIS — D696 Thrombocytopenia, unspecified: Secondary | ICD-10-CM | POA: Diagnosis not present

## 2016-12-11 DIAGNOSIS — Z87891 Personal history of nicotine dependence: Secondary | ICD-10-CM

## 2016-12-11 DIAGNOSIS — Z923 Personal history of irradiation: Secondary | ICD-10-CM

## 2016-12-11 DIAGNOSIS — I4891 Unspecified atrial fibrillation: Secondary | ICD-10-CM | POA: Diagnosis not present

## 2016-12-11 LAB — CBC WITH DIFFERENTIAL/PLATELET
BASOS ABS: 0 10*3/uL (ref 0–0.1)
Basophils Relative: 1 %
EOS PCT: 2 %
Eosinophils Absolute: 0 10*3/uL (ref 0–0.7)
HEMATOCRIT: 39.1 % — AB (ref 40.0–52.0)
Hemoglobin: 13.5 g/dL (ref 13.0–18.0)
LYMPHS ABS: 0.4 10*3/uL — AB (ref 1.0–3.6)
LYMPHS PCT: 21 %
MCH: 33.9 pg (ref 26.0–34.0)
MCHC: 34.4 g/dL (ref 32.0–36.0)
MCV: 98.4 fL (ref 80.0–100.0)
MONO ABS: 0.2 10*3/uL (ref 0.2–1.0)
Monocytes Relative: 10 %
NEUTROS ABS: 1.4 10*3/uL (ref 1.4–6.5)
Neutrophils Relative %: 66 %
PLATELETS: 47 10*3/uL — AB (ref 150–440)
RBC: 3.98 MIL/uL — AB (ref 4.40–5.90)
RDW: 13.8 % (ref 11.5–14.5)
WBC: 2.1 10*3/uL — ABNORMAL LOW (ref 3.8–10.6)

## 2016-12-11 LAB — PSA: Prostate Specific Ag, Serum: 0.5 ng/mL (ref 0.0–4.0)

## 2016-12-11 LAB — URINALYSIS, COMPLETE
Bilirubin, UA: NEGATIVE
GLUCOSE, UA: NEGATIVE
Ketones, UA: NEGATIVE
Leukocytes, UA: NEGATIVE
Nitrite, UA: NEGATIVE
Protein, UA: NEGATIVE
RBC, UA: NEGATIVE
Specific Gravity, UA: 1.025 (ref 1.005–1.030)
UUROB: 0.2 mg/dL (ref 0.2–1.0)
pH, UA: 5.5 (ref 5.0–7.5)

## 2016-12-11 NOTE — Telephone Encounter (Signed)
Spoke with pt wife in reference to PSA results and f/u. Wife voiced understanding. Orders placed.

## 2016-12-11 NOTE — Progress Notes (Signed)
[place]  [phone]) [phone] [phone]  ID: Earl Ramirez OB: 1940/12/15  MR#: [id number]  MGQ#:[id number]  Patient Care Team: Leonel Ramsay, MD as PCP - General (Infectious Diseases) Murrell Redden, MD (Urology) Teodoro Spray, MD (Cardiology)  CHIEF COMPLAINT:  No chief complaint on file.   INTERVAL HISTORY: Patient returns to clinic today for further evaluation and repeat laboratory work. He continues to feel well and remains asymptomatic. He denies any recent fevers or illnesses. He has a good appetite and denies weight loss. He denies any easy bleeding or bruising. He denies any chest pain or shortness of breath. He denies any nausea, vomiting, constipation, or diarrhea. He has no urinary complaints. Patient feels at his baseline and offers no specific complaints today.  REVIEW OF SYSTEMS:   Review of Systems  Constitutional: Negative.  Negative for fever, malaise/fatigue and weight loss.  Respiratory: Negative.  Negative for cough and shortness of breath.   Cardiovascular: Negative.  Negative for chest pain.  Gastrointestinal: Negative.  Negative for abdominal pain.  Genitourinary: Negative.   Musculoskeletal: Positive for joint pain.  Skin: Negative.  Negative for rash.  Neurological: Positive for sensory change. Negative for weakness.  Endo/Heme/Allergies: Does not bruise/bleed easily.  Psychiatric/Behavioral: Negative.  The patient is not nervous/anxious.     As per HPI. Otherwise, a complete review of systems is negative.  PAST MEDICAL HISTORY: Past Medical History:  Diagnosis Date  . Aortic valve replaced    1998  . Arthritis   . Atrial fibrillation ([place])   . COPD (chronic obstructive pulmonary disease) ([place])   . Hernia of abdominal wall   . Indigestion   . Neuropathy   . Prostate cancer ([place])   . Sleep apnea   . Thrombocytopenia ([place])   . Tremors of nervous system     PAST SURGICAL HISTORY: Past Surgical History:   Procedure Laterality Date  . CALDWELL LUC      FAMILY HISTORY: Reviewed and unchanged. No reported history of malignancy or chronic disease.     ADVANCED DIRECTIVES:       HEALTH MAINTENANCE: Social History  Substance Use Topics  . Smoking status: Former Smoker    Types: Cigarettes  . Smokeless tobacco: Former Systems developer    Quit date: 09/24/2000  . Alcohol use 1.2 oz/week    2 Cans of beer per week     Colonoscopy:  PAP:  Bone density:  Lipid panel:  Allergies  Allergen Reactions  . Oxycodone-Acetaminophen Nausea Only and Shortness Of Breath  . Amoxicillin Itching    Hand itching  . Penicillins Rash    Current Outpatient Prescriptions  Medication Sig Dispense Refill  . albuterol (PROVENTIL HFA;VENTOLIN HFA) 108 (90 BASE) MCG/ACT inhaler Inhale into the lungs.    . ALPRAZolam (XANAX) 0.25 MG tablet Take 0.25 mg by mouth daily as needed.     Marland Kitchen aspirin EC 81 MG tablet Take 81 mg by mouth.    . gabapentin (NEURONTIN) 300 MG capsule Take 2 capsules by mouth 2 (two) times daily.    . metoprolol succinate (TOPROL-XL) 25 MG 24 hr tablet Take 25 mg by mouth daily.     . sildenafil (REVATIO) 20 MG tablet Take 1 tablet (20 mg total) by mouth as needed. Take 1-5 tabs as needed prior to intercourse 30 tablet 11  . tamsulosin (FLOMAX) 0.4 MG CAPS capsule Take 1 capsule (0.4 mg total) by mouth daily. 30 capsule 11  . gabapentin (NEURONTIN) 300 MG capsule  Take 600 mg by mouth 2 (two) times daily.      No current facility-administered medications for this visit.     OBJECTIVE: Vitals:   12/11/16 1409  BP: 121/78  Pulse: 90  Resp: (!) 22  Temp: 98.6 F (37 C)     Body mass index is 37.06 kg/m.    ECOG FS:0 - Asymptomatic  General: Well-developed, well-nourished, no acute distress. Eyes: Pink conjunctiva, anicteric sclera. Lungs: Clear to auscultation bilaterally. Heart: Regular rate and rhythm. No rubs, murmurs, or gallops. Abdomen: Soft, nontender, nondistended. No  organomegaly noted, normoactive bowel sounds. Musculoskeletal: No edema, cyanosis, or clubbing. Neuro: Alert, answering all questions appropriately. Cranial nerves grossly intact. Skin: No rashes or petechiae noted. Psych: Normal affect.   LAB RESULTS:  No results found for: NA, K, CL, CO2, GLUCOSE, BUN, CREATININE, CALCIUM, PROT, ALBUMIN, AST, ALT, ALKPHOS, BILITOT, GFRNONAA, GFRAA  Lab Results  Component Value Date   WBC 2.1 (L) 12/11/2016   NEUTROABS 1.4 12/11/2016   HGB 13.5 12/11/2016   HCT 39.1 (L) 12/11/2016   MCV 98.4 12/11/2016   PLT 47 (L) 12/11/2016   Lab Results  Component Value Date   PSA 0.12 01/17/2016     STUDIES: No results found.  ASSESSMENT: Leukopenia and thrombocytopenia.  PLAN:   1.  Leukopenia: Patient's white blood cell count continues to be decreased, but relatively stable and unchanged for over 2 years. Previously, the remainder of his laboratory work was also either negative or within normal limits. Given his age, it is possible he has underlying MDS which would require a bone marrow biopsy to confirm, but this is not necessary at this point. If patient's counts declined further or he became symptomatic would consider a biopsy then.  No intervention is needed at this time.  After lengthy discussion with the patient, he does not feel that any further follow-up is necessary. Please continue to monitor his CBC 2-3 times per year and if he becomes symptomatic or his blood counts decreased below his current baseline please refer her back for further evaluation.   2. Thrombocytopenia: Platelet count continues to be decreased, but stable as well. Abdominal ultrasound on Dec 27, 2014 revealed mild splenomegaly. This is possibly contributing, but does not fully explain his cytopenias. No follow-up as above. Continue close monitoring with primary care. 2. Prostate cancer: Unclear stage. XRT completed on July 12, 2015. Patient's most recent PSA as above.    Patient expressed understanding and was in agreement with this plan. He also understands that He can call clinic at any time with any questions, concerns, or complaints.    Lloyd Huger, MD   12/14/2016 7:25 AM

## 2016-12-11 NOTE — Progress Notes (Signed)
Patient reports feelings of hopelessness at times, because he can't do the things he once did.  No thoughts of suicide noted or expressed, patient denies suicide.

## 2016-12-11 NOTE — Telephone Encounter (Signed)
-----   Message from [name], MD sent at 12/11/2016  9:22 AM EDT ----- PSA up slightly to 0.5.  Lets plan on rechecking in 6 months.  This is extremely important.    [name], MD

## 2017-01-01 ENCOUNTER — Telehealth: Payer: Self-pay | Admitting: Urology

## 2017-01-01 DIAGNOSIS — N401 Enlarged prostate with lower urinary tract symptoms: Secondary | ICD-10-CM

## 2017-01-01 NOTE — Telephone Encounter (Signed)
Spoke with pt in reference to flomax. Pt stated that he has been taking 2 flomax a day for the past several years. Pt requested 60 tabs be sent to pharmacy instead of 30. Please advise.

## 2017-01-01 NOTE — Telephone Encounter (Signed)
Pt called office LMOVM stating he is completely out of flomax and needs a refill as soon as possible to support him taking twice daily.  Pt begging for someone to please call him back, but pt did not leave a call back number. Please advise. Thanks.

## 2017-01-01 NOTE — Telephone Encounter (Signed)
That's fine.  Please send year script.    [name], MD

## 2017-01-02 MED ORDER — TAMSULOSIN HCL 0.4 MG PO CAPS: 0.4000 mg | ORAL_CAPSULE | Freq: Two times a day (BID) | ORAL | 11 refills | Status: DC

## 2017-01-02 NOTE — Telephone Encounter (Signed)
New script sent to pharmacy

## 2017-03-19 DIAGNOSIS — I4892 Unspecified atrial flutter: Secondary | ICD-10-CM | POA: Diagnosis not present

## 2017-03-19 DIAGNOSIS — R600 Localized edema: Secondary | ICD-10-CM | POA: Diagnosis not present

## 2017-03-19 DIAGNOSIS — C61 Malignant neoplasm of prostate: Secondary | ICD-10-CM | POA: Diagnosis not present

## 2017-03-19 DIAGNOSIS — D696 Thrombocytopenia, unspecified: Secondary | ICD-10-CM | POA: Diagnosis not present

## 2017-03-19 DIAGNOSIS — I872 Venous insufficiency (chronic) (peripheral): Secondary | ICD-10-CM | POA: Insufficient documentation

## 2017-03-19 DIAGNOSIS — G609 Hereditary and idiopathic neuropathy, unspecified: Secondary | ICD-10-CM | POA: Diagnosis not present

## 2017-03-19 DIAGNOSIS — R0602 Shortness of breath: Secondary | ICD-10-CM | POA: Diagnosis not present

## 2017-04-04 DIAGNOSIS — D61818 Other pancytopenia: Secondary | ICD-10-CM | POA: Diagnosis not present

## 2017-04-04 DIAGNOSIS — K5900 Constipation, unspecified: Secondary | ICD-10-CM | POA: Diagnosis not present

## 2017-04-04 DIAGNOSIS — D696 Thrombocytopenia, unspecified: Secondary | ICD-10-CM | POA: Diagnosis not present

## 2017-04-04 DIAGNOSIS — I4892 Unspecified atrial flutter: Secondary | ICD-10-CM | POA: Diagnosis not present

## 2017-04-04 DIAGNOSIS — R600 Localized edema: Secondary | ICD-10-CM | POA: Diagnosis not present

## 2017-04-09 DIAGNOSIS — R0789 Other chest pain: Secondary | ICD-10-CM | POA: Diagnosis not present

## 2017-04-09 DIAGNOSIS — G4730 Sleep apnea, unspecified: Secondary | ICD-10-CM | POA: Diagnosis not present

## 2017-04-09 DIAGNOSIS — I4892 Unspecified atrial flutter: Secondary | ICD-10-CM | POA: Diagnosis not present

## 2017-04-09 DIAGNOSIS — R0602 Shortness of breath: Secondary | ICD-10-CM | POA: Diagnosis not present

## 2017-06-05 ENCOUNTER — Other Ambulatory Visit: Payer: PPO

## 2017-06-05 DIAGNOSIS — L03119 Cellulitis of unspecified part of limb: Secondary | ICD-10-CM | POA: Diagnosis not present

## 2017-06-11 ENCOUNTER — Other Ambulatory Visit: Payer: PPO

## 2017-06-11 ENCOUNTER — Ambulatory Visit: Payer: PPO | Admitting: Urology

## 2017-06-11 DIAGNOSIS — C61 Malignant neoplasm of prostate: Secondary | ICD-10-CM | POA: Diagnosis not present

## 2017-06-12 LAB — PSA: Prostate Specific Ag, Serum: 1 ng/mL (ref 0.0–4.0)

## 2017-06-17 ENCOUNTER — Ambulatory Visit: Payer: PPO | Admitting: Urology

## 2017-06-17 ENCOUNTER — Encounter: Payer: Self-pay | Admitting: Urology

## 2017-06-17 VITALS — BP 93/53 | HR 109 | Ht 67.0 in | Wt 230.0 lb

## 2017-06-17 DIAGNOSIS — N529 Male erectile dysfunction, unspecified: Secondary | ICD-10-CM | POA: Diagnosis not present

## 2017-06-17 DIAGNOSIS — R350 Frequency of micturition: Secondary | ICD-10-CM | POA: Diagnosis not present

## 2017-06-17 DIAGNOSIS — R339 Retention of urine, unspecified: Secondary | ICD-10-CM

## 2017-06-17 DIAGNOSIS — C61 Malignant neoplasm of prostate: Secondary | ICD-10-CM

## 2017-06-17 DIAGNOSIS — N400 Enlarged prostate without lower urinary tract symptoms: Secondary | ICD-10-CM

## 2017-06-17 LAB — BLADDER SCAN AMB NON-IMAGING

## 2017-06-17 NOTE — Progress Notes (Signed)
06/17/2017 1:49 PM   Earl Ramirez 04/02/1941 [id number]  Referring provider: Leonel Ramsay, MD [place], [place] [address]  Chief Complaint  Patient presents with  . Prostate Cancer    25month    HPI: 76 year old male with a history of prostate cancer who presents today for annual follow-up.  He was diagnosed with Gleason 4+4 prostate cancer in 11/2014 with PSA around that time was 19. He received one year of Lupron as well as external beam radiation by Dr. Baruch Gouty.  His most recent PSA was 1.0 on 11/18.  His nadir to be 0.121-year ago.   No weight loss or bone pain.  He also has a history of BPH, incomplete bladder emptying and bladder stones. He underwent TURP and cystolitholapaxy in 01/2015.  He conitnues on flomax 0.8 mg with good effect.     His stream is good.  He does feel like he is able to empty his bladder.  He does have less frequency today.  Nocturia 0-1x.   No dysuria or gross hematuria. No urinary tract infections. PVR today 150 cc. Cr 0.7 8/18.  IPSS as below.  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  Filled script for Viagra (generic) but has not yet tried the prescription but scared to fill this.  He had aching in the LE with Cialis.    IPSS    Row Name 06/17/17 1300         International Prostate Symptom Score   How often have you had the sensation of not emptying your bladder?  Not at All     How often have you had to urinate less than every two hours?  Less than 1 in 5 times     How often have you found you stopped and started again several times when you urinated?  Less than 1 in 5 times     How often have you found it difficult to postpone urination?  Not at All     How often have you had a weak urinary stream?  Less than 1 in 5 times     How often have you had to strain to start urination?  Not at All     How many times did you typically get up at night to urinate?  1 Time     Total IPSS Score  4       Quality of Life due to urinary symptoms   If you were to spend the rest of your life with your urinary condition just the way it is now how would you feel about that?  Mostly Satisfied        Score:  1-7 Mild 8-19 Moderate 20-35 Severe   PMH: Past Medical History:  Diagnosis Date  . Aortic valve replaced    1998  . Arthritis   . Atrial fibrillation ([place])   . COPD (chronic obstructive pulmonary disease) ([place])   . Hernia of abdominal wall   . Indigestion   . Neuropathy   . Prostate cancer ([place])   . Sleep apnea   . Thrombocytopenia ([place])   . Tremors of nervous system     Surgical History: Past Surgical History:  Procedure Laterality Date  . CALDWELL LUC      Home Medications:  Allergies as of 06/17/2017      Reactions   Oxycodone-acetaminophen Nausea Only, Shortness Of Breath   Amoxicillin Itching   Hand itching   Penicillins Rash      Medication List  Accurate as of 06/17/17  1:49 PM. Always use your most recent med list.          albuterol 108 (90 Base) MCG/ACT inhaler Commonly known as:  PROVENTIL HFA;VENTOLIN HFA Inhale into the lungs.   ALPRAZolam 0.25 MG tablet Commonly known as:  XANAX Take 0.25 mg by mouth daily as needed.   aspirin EC 81 MG tablet Take 81 mg by mouth.   furosemide 20 MG tablet Commonly known as:  LASIX Take by mouth.   gabapentin 300 MG capsule Commonly known as:  NEURONTIN Take 600 mg by mouth 2 (two) times daily.   metoprolol succinate 25 MG 24 hr tablet Commonly known as:  TOPROL-XL Take 25 mg by mouth daily.   sildenafil 20 MG tablet Commonly known as:  REVATIO Take 1 tablet (20 mg total) by mouth as needed. Take 1-5 tabs as needed prior to intercourse   tamsulosin 0.4 MG Caps capsule Commonly known as:  FLOMAX Take 1 capsule (0.4 mg total) by mouth 2 (two) times daily.       Allergies:  Allergies  Allergen Reactions  . Oxycodone-Acetaminophen Nausea Only and Shortness Of Breath  . Amoxicillin Itching    Hand itching  . Penicillins Rash     Family History: Family History  Problem Relation Age of Onset  . Heart attack Father   . Alcohol abuse Father   . Congestive Heart Failure Mother   . Alzheimer's disease Sister     Social History:  reports that he has quit smoking. His smoking use included cigarettes. He quit smokeless tobacco use about 16 years ago. He reports that he drinks about 1.2 oz of alcohol per week. He reports that he does not use drugs.  ROS: UROLOGY Frequent Urination?: No Hard to postpone urination?: No Burning/pain with urination?: No Get up at night to urinate?: Yes Leakage of urine?: No Urine stream starts and stops?: Yes Trouble starting stream?: No Do you have to strain to urinate?: No Blood in urine?: No Urinary tract infection?: No Sexually transmitted disease?: No Injury to kidneys or bladder?: No Painful intercourse?: No Weak stream?: No Erection problems?: Yes Penile pain?: No  Gastrointestinal Nausea?: No Vomiting?: No Indigestion/heartburn?: No Diarrhea?: No Constipation?: No  Constitutional Fever: No Night sweats?: No Weight loss?: No Fatigue?: No  Skin Skin rash/lesions?: No Itching?: No  Eyes Blurred vision?: No Double vision?: No  Ears/Nose/Throat Sore throat?: No Sinus problems?: Yes  Hematologic/Lymphatic Swollen glands?: No Easy bruising?: Yes  Cardiovascular Leg swelling?: Yes Chest pain?: No  Respiratory Cough?: No Shortness of breath?: No  Endocrine Excessive thirst?: No  Musculoskeletal Back pain?: No Joint pain?: Yes  Neurological Headaches?: No Dizziness?: No  Psychologic Depression?: No Anxiety?: No  Physical Exam: BP (!) 93/53   Pulse (!) 109   Ht 5\' 7"  (1.702 m)   Wt 230 lb (104.3 kg)   BMI 36.02 kg/m   Constitutional:  Alert and oriented, No acute distress.  Ambulating with walker. HEENT: [place] AT, moist mucus membranes.  Trachea midline, no masses. Cardiovascular: No clubbing, cyanosis, or edema. Respiratory:  Normal respiratory effort, no increased work of breathing. GI: Abdomen is soft, nontender, nondistended, no abdominal masses.  Obese. GU: No CVA tenderness. Skin: No rashes, bruises or suspicious lesions. Neurologic: Grossly intact, no focal deficits, moving all 4 extremities. Psychiatric: Normal mood and affect.  Laboratory Data: Lab Results  Component Value Date   WBC 2.1 (L) 12/11/2016   HGB 13.5 12/11/2016   HCT 39.1 (L)  12/11/2016   MCV 98.4 12/11/2016   PLT 47 (L) 12/11/2016    Lab Results  Component Value Date   PSA 0.12 01/17/2016    Urinalysis n/a  Pertinent Imaging: n/a  Assessment & Plan:    1. Prostate cancer ([place]) Gleason 4+4 prostate cancer diagnosed 11/2014, status post EBRT/ Lupron x one year Recommend PSA check every 6 months Slowly rising PSA but does not meet criteria for biochemical recurrence - PSA  2. Erectile dysfunction, unspecified erectile dysfunction type Has yet to try Viagra, will give it a try prior to next visit  3. BPH with obstruction/lower urinary tract symptoms Continue Flomax 0.8 PVR 150 cc  4. Urinary frequency Minimal bother - Urinalysis, Complete   Return in about 6 months (around 12/15/2017) for PSA/ PVR/ IPSS.  [name], MD  [facility] [address], [place], [place] [address] [phone]

## 2017-07-08 DIAGNOSIS — R161 Splenomegaly, not elsewhere classified: Secondary | ICD-10-CM | POA: Diagnosis not present

## 2017-07-08 DIAGNOSIS — R600 Localized edema: Secondary | ICD-10-CM | POA: Diagnosis not present

## 2017-07-08 DIAGNOSIS — I4892 Unspecified atrial flutter: Secondary | ICD-10-CM | POA: Diagnosis not present

## 2017-07-08 DIAGNOSIS — D696 Thrombocytopenia, unspecified: Secondary | ICD-10-CM | POA: Diagnosis not present

## 2017-07-08 DIAGNOSIS — G609 Hereditary and idiopathic neuropathy, unspecified: Secondary | ICD-10-CM | POA: Diagnosis not present

## 2017-07-08 DIAGNOSIS — Z0000 Encounter for general adult medical examination without abnormal findings: Secondary | ICD-10-CM | POA: Diagnosis not present

## 2017-07-08 DIAGNOSIS — D61818 Other pancytopenia: Secondary | ICD-10-CM | POA: Diagnosis not present

## 2017-07-08 DIAGNOSIS — C61 Malignant neoplasm of prostate: Secondary | ICD-10-CM | POA: Diagnosis not present

## 2017-10-16 DIAGNOSIS — J42 Unspecified chronic bronchitis: Secondary | ICD-10-CM | POA: Diagnosis not present

## 2017-10-16 DIAGNOSIS — I4892 Unspecified atrial flutter: Secondary | ICD-10-CM | POA: Diagnosis not present

## 2017-10-16 DIAGNOSIS — R0789 Other chest pain: Secondary | ICD-10-CM | POA: Diagnosis not present

## 2017-12-10 ENCOUNTER — Other Ambulatory Visit: Payer: Self-pay | Admitting: Family Medicine

## 2017-12-11 ENCOUNTER — Other Ambulatory Visit: Payer: PPO

## 2017-12-11 DIAGNOSIS — C61 Malignant neoplasm of prostate: Secondary | ICD-10-CM

## 2017-12-12 LAB — PSA: Prostate Specific Ag, Serum: 1.4 ng/mL (ref 0.0–4.0)

## 2017-12-16 ENCOUNTER — Encounter: Payer: Self-pay | Admitting: Urology

## 2017-12-16 ENCOUNTER — Ambulatory Visit: Payer: PPO | Admitting: Urology

## 2017-12-16 VITALS — BP 113/73 | HR 102 | Resp 16 | Ht 68.0 in | Wt 234.8 lb

## 2017-12-16 DIAGNOSIS — C61 Malignant neoplasm of prostate: Secondary | ICD-10-CM

## 2017-12-16 DIAGNOSIS — R339 Retention of urine, unspecified: Secondary | ICD-10-CM

## 2017-12-16 DIAGNOSIS — N529 Male erectile dysfunction, unspecified: Secondary | ICD-10-CM

## 2017-12-16 DIAGNOSIS — N401 Enlarged prostate with lower urinary tract symptoms: Secondary | ICD-10-CM | POA: Diagnosis not present

## 2017-12-16 LAB — BLADDER SCAN AMB NON-IMAGING

## 2017-12-16 NOTE — Progress Notes (Signed)
12/16/2017 1:40 PM   Earl Ramirez 05-23-1941 [id number]  Referring provider: Leonel Ramsay, MD [place], [place] [address]  No chief complaint on file.   HPI: 77 year old male with a history of prostate cancer who presents today for annual follow-up.  He was diagnosed with Gleason 4+4 prostate cancer in 11/2014 with PSA around that time was 19. He received one year of Lupron as well as external beam radiation by Dr. Baruch Gouty.    PSA continues to rise slowly, now 1.4 as of 12/11/2017 up from 1.0  6 months ago.  He also has a history of BPH, incomplete bladder emptying and bladder stones. He underwent TURP and cystolitholapaxy in 01/2015.  He conitnues on flomax 0.8 mg with good effect.     His stream is good.  He does feel like he is able to empty his bladder. Frequency has improved dramatically.   Nocturia 0-1x.   No dysuria or gross hematuria. No urinary tract infections.   PVR today 85 cc. Cr 0.7 8/18.   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 since his last visit, he is tried several doses of sildenafil up to 60 mg.  He had no effect from it.  He is interested in trying a higher dose but has some concerns about the medication.  He explains no side effects.  PMH: Past Medical History:  Diagnosis Date  . Aortic valve replaced    1998  . Arthritis   . Atrial fibrillation ([place])   . COPD (chronic obstructive pulmonary disease) ([place])   . Hernia of abdominal wall   . Indigestion   . Neuropathy   . Prostate cancer ([place])   . Sleep apnea   . Thrombocytopenia ([place])   . Tremors of nervous system     Surgical History: Past Surgical History:  Procedure Laterality Date  . CALDWELL LUC      Home Medications:  Allergies as of 12/16/2017      Reactions   Oxycodone-acetaminophen Nausea Only, Shortness Of Breath   Amoxicillin Itching   Hand itching   Penicillins Rash      Medication List        Accurate as of 12/16/17  1:40 PM. Always use your most recent med list.            albuterol 108 (90 Base) MCG/ACT inhaler Commonly known as:  PROVENTIL HFA;VENTOLIN HFA Inhale into the lungs.   ALPRAZolam 0.25 MG tablet Commonly known as:  XANAX Take 0.25 mg by mouth daily as needed.   aspirin EC 81 MG tablet Take 81 mg by mouth.   docusate sodium 100 MG capsule Commonly known as:  COLACE Take by mouth.   folic acid 1 MG tablet Commonly known as:  FOLVITE Take by mouth.   furosemide 20 MG tablet Commonly known as:  LASIX Take by mouth.   gabapentin 300 MG capsule Commonly known as:  NEURONTIN Take 600 mg by mouth 2 (two) times daily.   metoprolol succinate 25 MG 24 hr tablet Commonly known as:  TOPROL-XL Take 25 mg by mouth daily.   omeprazole 40 MG capsule Commonly known as:  PRILOSEC Take by mouth.   sildenafil 20 MG tablet Commonly known as:  REVATIO Take 1 tablet (20 mg total) by mouth as needed. Take 1-5 tabs as needed prior to intercourse   tamsulosin 0.4 MG Caps capsule Commonly known as:  FLOMAX Take 1 capsule (0.4 mg total) by mouth 2 (two) times daily.  Allergies:  Allergies  Allergen Reactions  . Oxycodone-Acetaminophen Nausea Only and Shortness Of Breath  . Amoxicillin Itching    Hand itching  . Penicillins Rash    Family History: Family History  Problem Relation Age of Onset  . Heart attack Father   . Alcohol abuse Father   . Congestive Heart Failure Mother   . Alzheimer's disease Sister     Social History:  reports that he has quit smoking. His smoking use included cigarettes. He quit smokeless tobacco use about 17 years ago. He reports that he drinks about 1.2 oz of alcohol per week. He reports that he does not use drugs.  ROS: UROLOGY Frequent Urination?: No Hard to postpone urination?: No Burning/pain with urination?: No Get up at night to urinate?: Yes Leakage of urine?: Yes Urine stream starts and stops?: Yes Trouble starting stream?: No Do you have to strain to urinate?: No Blood in  urine?: No Urinary tract infection?: No Sexually transmitted disease?: No Injury to kidneys or bladder?: No Painful intercourse?: No Weak stream?: No Erection problems?: No Penile pain?: No  Gastrointestinal Nausea?: No Vomiting?: No Indigestion/heartburn?: No Diarrhea?: No Constipation?: No  Constitutional Fever: No Night sweats?: No Weight loss?: No Fatigue?: No  Skin Skin rash/lesions?: No Itching?: No  Eyes Blurred vision?: No Double vision?: No  Ears/Nose/Throat Sore throat?: No Sinus problems?: Yes  Hematologic/Lymphatic Swollen glands?: No Easy bruising?: Yes  Cardiovascular Leg swelling?: Yes Chest pain?: No  Respiratory Cough?: No Shortness of breath?: Yes  Endocrine Excessive thirst?: No  Musculoskeletal Back pain?: No Joint pain?: Yes  Neurological Headaches?: No Dizziness?: No  Psychologic Depression?: No Anxiety?: No  Physical Exam: BP 113/73   Pulse (!) 102   Resp 16   Ht 5\' 8"  (1.727 m)   Wt 234 lb 12.8 oz (106.5 kg)   SpO2 98%   BMI 35.70 kg/m   Constitutional:  Alert and oriented, No acute distress. HEENT: [place] AT, moist mucus membranes.  Trachea midline, no masses. Cardiovascular: No clubbing, cyanosis, or edema. Respiratory: Normal respiratory effort, no increased work of breathing. Skin: No rashes, bruises or suspicious lesions. Neurologic: Grossly intact, no focal deficits, moving all 4 extremities. Psychiatric: Normal mood and affect.  Laboratory Data: Lab Results  Component Value Date   WBC 2.1 (L) 12/11/2016   HGB 13.5 12/11/2016   HCT 39.1 (L) 12/11/2016   MCV 98.4 12/11/2016   PLT 47 (L) 12/11/2016    PSA as above  Urinalysis N/a  Pertinent Imaging: Results for orders placed or performed in visit on 12/16/17  BLADDER SCAN AMB NON-IMAGING  Result Value Ref Range   Scan Result 42ml     Assessment & Plan:     1. Prostate cancer ([place]) Gleason 4+4 prostate cancer diagnosed 11/2014, status post  EBRT/ Lupron x one year Recommend PSA check every 6 months, now up to 1.4 Slowly rising PSA but does not meet criteria for biochemical recurrence We will continue to follow closely - PSA  2. Erectile dysfunction, unspecified erectile dysfunction type No effect with sildenafil 60 mg Okay to titrate up to 100 mg as needed We discussed options for refractory erectile dysfunction if no effect, not interested in injections or prosthesis  3. BPH with obstruction/lower urinary tract symptoms/history of incomplete bladder emptying Continue Flomax 0.8 Improved bladder emptying today, PVR 85 cc We will continue to follow    Return in about 6 months (around 06/18/2018) for PSA prior/ PVR.  [name], MD  [facility]  [address], [place], [place] [address] [phone]

## 2018-02-25 ENCOUNTER — Telehealth: Payer: Self-pay | Admitting: Urology

## 2018-02-25 NOTE — Telephone Encounter (Signed)
Pt thinks he has a UTI, it's painful and burns to urinate, urine is very dark, also he is going frequently.  He would like to come in on nurse schedule to drop off a urine sample.

## 2018-02-25 NOTE — Telephone Encounter (Signed)
Please call and have pt come in for nurse visit.

## 2018-02-26 ENCOUNTER — Telehealth: Payer: Self-pay | Admitting: Urology

## 2018-02-26 NOTE — Telephone Encounter (Signed)
Pt made appt to come in on nurse schedule next Tuesday 7/30, just F.Y.I.

## 2018-03-03 ENCOUNTER — Other Ambulatory Visit: Payer: Self-pay

## 2018-03-03 ENCOUNTER — Ambulatory Visit (INDEPENDENT_AMBULATORY_CARE_PROVIDER_SITE_OTHER): Payer: PPO | Admitting: Family Medicine

## 2018-03-03 VITALS — BP 117/69 | HR 106 | Ht 68.0 in | Wt 234.0 lb

## 2018-03-03 DIAGNOSIS — R300 Dysuria: Secondary | ICD-10-CM | POA: Diagnosis not present

## 2018-03-03 MED ORDER — SULFAMETHOXAZOLE-TRIMETHOPRIM 800-160 MG PO TABS: 1.0000 | ORAL_TABLET | Freq: Two times a day (BID) | ORAL | 0 refills | Status: DC

## 2018-03-03 NOTE — Progress Notes (Signed)
Patient presents today with urinary frequency and dysuria. A urine was collected for UA, UCX. Patient also complains of incontinence. His symptoms began about 2 weeks ago and have been getting increasingly worse. He has not been on any ABX or had any Urological surgeries in the last 30 days. He is allergic to Amoxicillin, Penicillin.

## 2018-03-03 NOTE — Addendum Note (Signed)
Addended by: Kyra Manges on: 03/03/2018 02:08 PM   Modules accepted: Level of Service

## 2018-03-03 NOTE — Addendum Note (Signed)
Addended by: Kyra Manges on: 03/03/2018 03:38 PM   Modules accepted: Orders

## 2018-03-04 LAB — URINALYSIS, COMPLETE
BILIRUBIN UA: NEGATIVE
Glucose, UA: NEGATIVE
Ketones, UA: NEGATIVE
Nitrite, UA: POSITIVE — AB
PH UA: 5 (ref 5.0–7.5)
Specific Gravity, UA: 1.025 (ref 1.005–1.030)
Urobilinogen, Ur: 0.2 mg/dL (ref 0.2–1.0)

## 2018-03-04 LAB — MICROSCOPIC EXAMINATION: WBC, UA: >30 /hpf — ABNORMAL HIGH (ref 0–5)

## 2018-03-06 LAB — CULTURE, URINE COMPREHENSIVE

## 2018-06-08 DIAGNOSIS — I4892 Unspecified atrial flutter: Secondary | ICD-10-CM | POA: Diagnosis not present

## 2018-06-08 DIAGNOSIS — J42 Unspecified chronic bronchitis: Secondary | ICD-10-CM | POA: Diagnosis not present

## 2018-06-08 DIAGNOSIS — I359 Nonrheumatic aortic valve disorder, unspecified: Secondary | ICD-10-CM | POA: Insufficient documentation

## 2018-06-08 DIAGNOSIS — D696 Thrombocytopenia, unspecified: Secondary | ICD-10-CM | POA: Diagnosis not present

## 2018-06-08 DIAGNOSIS — G4730 Sleep apnea, unspecified: Secondary | ICD-10-CM | POA: Diagnosis not present

## 2018-06-23 ENCOUNTER — Other Ambulatory Visit: Payer: Self-pay

## 2018-06-23 DIAGNOSIS — C61 Malignant neoplasm of prostate: Secondary | ICD-10-CM

## 2018-06-24 ENCOUNTER — Other Ambulatory Visit: Payer: PPO

## 2018-06-26 ENCOUNTER — Telehealth: Payer: Self-pay | Admitting: Urology

## 2018-06-26 ENCOUNTER — Telehealth: Payer: Self-pay

## 2018-06-26 ENCOUNTER — Other Ambulatory Visit
Admission: RE | Admit: 2018-06-26 | Discharge: 2018-06-26 | Disposition: A | Discharge status: Home / Self Care | Payer: PPO | Source: Ambulatory Visit | Attending: Urology | Admitting: Urology

## 2018-06-26 ENCOUNTER — Ambulatory Visit (INDEPENDENT_AMBULATORY_CARE_PROVIDER_SITE_OTHER): Payer: PPO | Admitting: Urology

## 2018-06-26 DIAGNOSIS — C61 Malignant neoplasm of prostate: Secondary | ICD-10-CM | POA: Diagnosis not present

## 2018-06-26 DIAGNOSIS — R350 Frequency of micturition: Secondary | ICD-10-CM

## 2018-06-26 LAB — URINALYSIS, COMPLETE (UACMP) WITH MICROSCOPIC
Bilirubin Urine: NEGATIVE
GLUCOSE, UA: NEGATIVE mg/dL
Ketones, ur: NEGATIVE mg/dL
NITRITE: POSITIVE — AB
Protein, ur: 100 mg/dL — AB
Specific Gravity, Urine: 1.025 (ref 1.005–1.030)
pH: 5 (ref 5.0–8.0)

## 2018-06-26 LAB — PSA: PROSTATIC SPECIFIC ANTIGEN: 1.97 ng/mL (ref 0.00–4.00)

## 2018-06-26 MED ORDER — SULFAMETHOXAZOLE-TRIMETHOPRIM 800-160 MG PO TABS: 1.0000 | ORAL_TABLET | Freq: Two times a day (BID) | ORAL | 0 refills | Status: DC

## 2018-06-26 NOTE — Telephone Encounter (Signed)
Spoke with patient's wife and notified her that per Dr. Erlene Quan Urine today looks grossly positive for infection and we will send in bactrim for him to start and call with culture results next week

## 2018-06-26 NOTE — Telephone Encounter (Signed)
Patient presented today to the office for his follow-up appointment.  Unfortunately, he failed to have his PSA drawn prior to the appointment today.  As such, we will have him get his PSA drawn today and reschedule.  In addition to this, he mentions that he may have a urinary tract infection as he is been having increased frequency with occasional hematuria.  No fevers or chills.  In for UA/urine culture today in addition to the above.  He will reschedule his follow-up for the near future.  [name], MD

## 2018-06-29 ENCOUNTER — Telehealth: Payer: Self-pay

## 2018-06-29 LAB — URINE CULTURE: Culture: >=100000 — AB

## 2018-06-29 NOTE — Telephone Encounter (Signed)
-----   Message from [name], MD sent at 06/29/2018  3:33 PM EST ----- Urine culture grew Klebsiella.  Bactrim should be appropriate.  [name], MD

## 2018-06-29 NOTE — Telephone Encounter (Signed)
Patient notified. Told to keep follow up in December and have PSA repeated prior to that appointment

## 2018-06-30 ENCOUNTER — Ambulatory Visit: Payer: PPO | Admitting: Urology

## 2018-07-22 ENCOUNTER — Other Ambulatory Visit: Payer: PPO

## 2018-07-22 ENCOUNTER — Telehealth: Payer: Self-pay | Admitting: Urology

## 2018-07-22 DIAGNOSIS — R300 Dysuria: Secondary | ICD-10-CM

## 2018-07-22 DIAGNOSIS — C61 Malignant neoplasm of prostate: Secondary | ICD-10-CM

## 2018-07-22 LAB — URINALYSIS, COMPLETE
BILIRUBIN UA: NEGATIVE
GLUCOSE, UA: NEGATIVE
Ketones, UA: NEGATIVE
Nitrite, UA: POSITIVE — AB
SPEC GRAV UA: 1.025 (ref 1.005–1.030)
Urobilinogen, Ur: 0.2 mg/dL (ref 0.2–1.0)
pH, UA: 5.5 (ref 5.0–7.5)

## 2018-07-22 LAB — MICROSCOPIC EXAMINATION: Epithelial Cells (non renal): NONE SEEN /hpf (ref 0–10)

## 2018-07-22 MED ORDER — CIPROFLOXACIN HCL 250 MG PO TABS: 250.0000 mg | ORAL_TABLET | Freq: Two times a day (BID) | ORAL | 0 refills | Status: DC

## 2018-07-22 NOTE — Telephone Encounter (Signed)
Pt came in to office for lab work and wanted to leave a urine sample.  He thinks he has a UTI.  He is having pain, burning and frequency.  A few weeks ago, there was blood in his urine and he thinks the color is too dark.  He left urine sample and filled out symptom checklist.

## 2018-07-22 NOTE — Progress Notes (Signed)
Patient notified on vmail with Detailed message per DPR, script sent to pharmacy

## 2018-07-23 LAB — PSA: PROSTATE SPECIFIC AG, SERUM: 2.1 ng/mL (ref 0.0–4.0)

## 2018-07-23 MED ORDER — SULFAMETHOXAZOLE-TRIMETHOPRIM 800-160 MG PO TABS: 1.0000 | ORAL_TABLET | Freq: Two times a day (BID) | ORAL | 0 refills | Status: DC

## 2018-07-23 NOTE — Telephone Encounter (Signed)
Bactrim sent to [facility].  Left message informing patient.

## 2018-07-23 NOTE — Telephone Encounter (Signed)
-----   Message from [name], MD sent at 07/22/2018  6:46 PM EST ----- It looks like this patient may have stop by today to leave a urine.  No message was sent to me and I did not see the sheet.  I did see Tracy's phone message saying that he was having urinary tract symptoms.  Lets go ahead and treat with Bactrim DS twice daily for 7 days and adjust as needed.  [name], MD

## 2018-07-24 ENCOUNTER — Ambulatory Visit (INDEPENDENT_AMBULATORY_CARE_PROVIDER_SITE_OTHER): Payer: PPO | Admitting: Urology

## 2018-07-24 ENCOUNTER — Encounter: Payer: Self-pay | Admitting: Urology

## 2018-07-24 VITALS — BP 137/72 | HR 109 | Ht 67.0 in | Wt 230.0 lb

## 2018-07-24 DIAGNOSIS — C61 Malignant neoplasm of prostate: Secondary | ICD-10-CM

## 2018-07-24 DIAGNOSIS — N390 Urinary tract infection, site not specified: Secondary | ICD-10-CM | POA: Diagnosis not present

## 2018-07-24 DIAGNOSIS — R350 Frequency of micturition: Secondary | ICD-10-CM

## 2018-07-24 LAB — BLADDER SCAN AMB NON-IMAGING

## 2018-07-24 NOTE — Progress Notes (Signed)
07/24/2018 3:16 PM   Earl Ramirez [birth year] [id number]  Referring provider: Leonel Ramsay, MD [address] [place], [place] [address]  Chief Complaint  Patient presents with  . Prostate Cancer    65mo    HPI: 77 year-old male with a personal history of high risk prostate cancer, BPH with bladder outlet obstruction and bladder stones, and most recently recurrent urinary tract infections who returns today for routine 55-month follow-up.  He was initially diagnosed with high risk prostate cancer, Gleason +4 by Dr. Jacqlyn Larsen in 2016.  Staging was negative for metastatic disease.  The time, his PSA was 19.  He underwent EBRT with 1 year of ADT.  Over the past several years, his PSA has risen slowly but steadily.  His most recent PSA was up to 2.1 as of 07/22/2018.  The was in the setting of infection.  Since his last visit 6 months ago, he has been treated with 3 urinary tract infections.  Each time he has urinary frequency, urgency, and burning along with dark-colored urine.  He last rubbed off a urine 2 days ago on 07/22/2018 which was frankly positive.  Its currently growing 100,000 colonies of gram-negative rods.  He was started on Cipro for presumed urinary tract infection but he has not actually started the medication.  He reports that when he got to the pharmacy, there were 2 different antibiotics both Bactrim and Cipro there.  He was not sure which one he wanted to be filled so he filled the Cipro.  He has baseline urinary urgency/ frequency and occasionally leaks urine.  Nocturia x 1.  He is flomax.    Post void residual today is 71 cc.     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 since his last visit, he is tried several doses of sildenafil up to 60 mg.  He had no effect from it.  He is interested in trying a higher dose but has some concerns about the medication.  He explains no side effects.  PMH: Past Medical History:  Diagnosis Date  . Aortic valve replaced    1998  .  Arthritis   . Atrial fibrillation ([place])   . COPD (chronic obstructive pulmonary disease) ([place])   . Hernia of abdominal wall   . Indigestion   . Neuropathy   . Prostate cancer ([place])   . Sleep apnea   . Thrombocytopenia ([place])   . Tremors of nervous system     Surgical History: Past Surgical History:  Procedure Laterality Date  . CALDWELL LUC      Home Medications:  Allergies as of 07/24/2018      Reactions   Oxycodone-acetaminophen Nausea Only, Shortness Of Breath   Amoxicillin Itching   Hand itching   Penicillins Rash      Medication List       Accurate as of July 24, 2018  3:16 PM. Always use your most recent med list.        albuterol 108 (90 Base) MCG/ACT inhaler Commonly known as:  PROVENTIL HFA;VENTOLIN HFA Inhale into the lungs.   ALPRAZolam 0.25 MG tablet Commonly known as:  XANAX Take 0.25 mg by mouth daily as needed.   aspirin EC 81 MG tablet Take 81 mg by mouth.   docusate sodium 100 MG capsule Commonly known as:  COLACE Take by mouth.   gabapentin 300 MG capsule Commonly known as:  NEURONTIN Take 600 mg by mouth 2 (two) times daily.   metoprolol succinate 25  MG 24 hr tablet Commonly known as:  TOPROL-XL Take 25 mg by mouth daily.   sildenafil 20 MG tablet Commonly known as:  REVATIO Take 1 tablet (20 mg total) by mouth as needed. Take 1-5 tabs as needed prior to intercourse   sulfamethoxazole-trimethoprim 800-160 MG tablet Commonly known as:  BACTRIM DS,SEPTRA DS Take 1 tablet by mouth 2 (two) times daily.   tamsulosin 0.4 MG Caps capsule Commonly known as:  FLOMAX Take 1 capsule (0.4 mg total) by mouth 2 (two) times daily.       Allergies:  Allergies  Allergen Reactions  . Oxycodone-Acetaminophen Nausea Only and Shortness Of Breath  . Amoxicillin Itching    Hand itching  . Penicillins Rash    Family History: Family History  Problem Relation Age of Onset  . Heart attack Father   . Alcohol abuse Father   . Congestive  Heart Failure Mother   . Alzheimer's disease Sister     Social History:  reports that he has quit smoking. His smoking use included cigarettes. He quit smokeless tobacco use about 17 years ago. He reports current alcohol use of about 2.0 standard drinks of alcohol per week. He reports that he does not use drugs.  ROS: UROLOGY Frequent Urination?: Yes Hard to postpone urination?: Yes Burning/pain with urination?: Yes Get up at night to urinate?: Yes Leakage of urine?: Yes Urine stream starts and stops?: No Trouble starting stream?: No Do you have to strain to urinate?: No Blood in urine?: Yes Urinary tract infection?: Yes Sexually transmitted disease?: No Injury to kidneys or bladder?: No Painful intercourse?: No Weak stream?: No Erection problems?: No Penile pain?: No  Gastrointestinal Nausea?: No Vomiting?: No Indigestion/heartburn?: No Diarrhea?: No Constipation?: No  Constitutional Fever: No Night sweats?: No Weight loss?: No Fatigue?: No  Skin Skin rash/lesions?: No Itching?: No  Eyes Blurred vision?: No Double vision?: No  Ears/Nose/Throat Sore throat?: No Sinus problems?: No  Hematologic/Lymphatic Swollen glands?: No Easy bruising?: No  Cardiovascular Leg swelling?: Yes Chest pain?: No  Respiratory Cough?: No Shortness of breath?: No  Endocrine Excessive thirst?: No  Musculoskeletal Back pain?: No Joint pain?: Yes  Neurological Headaches?: No Dizziness?: No  Psychologic Depression?: No Anxiety?: No  Physical Exam: BP 137/72   Pulse (!) 109   Ht 5\' 7"  (1.702 m)   Wt 230 lb (104.3 kg)   BMI 36.02 kg/m   Constitutional:  Alert and oriented, No acute distress. HEENT: [place] AT, moist mucus membranes.  Trachea midline, no masses. Cardiovascular: No clubbing, cyanosis, or edema. Respiratory: Normal respiratory effort, no increased work of breathing. Skin: No rashes, bruises or suspicious lesions. Neurologic: Grossly intact, no focal  deficits, moving all 4 extremities. Psychiatric: Normal mood and affect.  Laboratory Data: Lab Results  Component Value Date   WBC 2.1 (L) 12/11/2016   HGB 13.5 12/11/2016   HCT 39.1 (L) 12/11/2016   MCV 98.4 12/11/2016   PLT 47 (L) 12/11/2016    Lab Results  Component Value Date   PSA 0.12 01/17/2016   Urinalysis    Component Value Date/Time   COLORURINE YELLOW 06/26/2018 1446   APPEARANCEUR Cloudy (A) 07/22/2018 1342   LABSPEC 1.025 06/26/2018 1446   PHURINE 5.0 06/26/2018 1446   GLUCOSEU Negative 07/22/2018 1342   HGBUR MODERATE (A) 06/26/2018 1446   BILIRUBINUR Negative 07/22/2018 1342   KETONESUR NEGATIVE 06/26/2018 1446   PROTEINUR 2+ (A) 07/22/2018 1342   PROTEINUR 100 (A) 06/26/2018 1446   NITRITE Positive (A) 07/22/2018 1342  NITRITE POSITIVE (A) 06/26/2018 1446   LEUKOCYTESUR 2+ (A) 07/22/2018 1342    Lab Results  Component Value Date   LABMICR See below: 07/22/2018   WBCUA 11-30 (A) 07/22/2018   RBCUA 3-10 (A) 07/22/2018   LABEPIT None seen 07/22/2018   MUCUS Present (A) 07/22/2018   BACTERIA Many (A) 07/22/2018    Pertinent Imaging: Results for orders placed or performed in visit on 07/24/18  BLADDER SCAN AMB NON-IMAGING  Result Value Ref Range   Scan Result 56ml     Assessment & Plan:    1. Recurrent UTI 3 UTIs over the last 6 months Recommend cystoscopy to r/o obstruction or stones or any other nidus for these recurrent infections He will continue the Cipro for a week, if his symptoms recur or fail to resolve, will extend course  2. Urinary frequency Any Flomax Reasonable bladder emptying today - BLADDER SCAN AMB NON-IMAGING  3. Prostate cancer ([place]) Gleason 4+4 prostate cancer diagnosed 11/2014, status post EBRT/ Lupron x one year  Recommend PSA check every72months, now up to 2.1 Slowly rising PSA just on the cusp of concern for biochemical recurrence, nadir unknown We will continue to follow closely, given the fact that his PSA is  rising relatively slowly, will continue following for the time being, likely repeat in 3 months and consider repeat staging thereafter   Return in about 4 weeks (around 08/21/2018) for cysto.  [name], MD  [facility] [address] [place], [place] [address] [phone]

## 2018-07-26 LAB — CULTURE, URINE COMPREHENSIVE

## 2018-07-27 ENCOUNTER — Telehealth: Payer: Self-pay

## 2018-07-27 NOTE — Telephone Encounter (Signed)
Patient notified on vmail 

## 2018-07-27 NOTE — Telephone Encounter (Signed)
-----   Message from Nori Riis, PA-C sent at 07/27/2018  9:50 AM EST ----- Please let Mr. Fiumara know that his urine culture was positive for infection.  He needs to continue the Septra or Cipro as prescribed.

## 2018-08-24 DIAGNOSIS — I4892 Unspecified atrial flutter: Secondary | ICD-10-CM | POA: Diagnosis not present

## 2018-08-24 DIAGNOSIS — R8290 Unspecified abnormal findings in urine: Secondary | ICD-10-CM | POA: Diagnosis not present

## 2018-08-24 DIAGNOSIS — Z13220 Encounter for screening for lipoid disorders: Secondary | ICD-10-CM | POA: Diagnosis not present

## 2018-08-24 DIAGNOSIS — D61818 Other pancytopenia: Secondary | ICD-10-CM | POA: Diagnosis not present

## 2018-08-24 DIAGNOSIS — J42 Unspecified chronic bronchitis: Secondary | ICD-10-CM | POA: Diagnosis not present

## 2018-08-24 DIAGNOSIS — D696 Thrombocytopenia, unspecified: Secondary | ICD-10-CM | POA: Diagnosis not present

## 2018-08-24 DIAGNOSIS — C61 Malignant neoplasm of prostate: Secondary | ICD-10-CM | POA: Diagnosis not present

## 2018-08-25 DIAGNOSIS — R8290 Unspecified abnormal findings in urine: Secondary | ICD-10-CM | POA: Diagnosis not present

## 2018-09-10 ENCOUNTER — Other Ambulatory Visit: Payer: Self-pay

## 2018-09-10 DIAGNOSIS — N390 Urinary tract infection, site not specified: Secondary | ICD-10-CM

## 2018-09-11 ENCOUNTER — Other Ambulatory Visit: Payer: PPO | Admitting: Urology

## 2018-09-11 ENCOUNTER — Telehealth: Payer: Self-pay | Admitting: Urology

## 2018-09-11 NOTE — Telephone Encounter (Signed)
Spoke with patient and he states that he is having dysuria and would like to drop off a urine but is unable to come until Monday. He was added on to the nurse schedule Monday morning

## 2018-09-11 NOTE — Telephone Encounter (Signed)
Left pt mess to call 

## 2018-09-11 NOTE — Telephone Encounter (Signed)
Pt has had to be r/s from [facility] and is having some freq urination and burning. He would like a call back please.

## 2018-09-14 ENCOUNTER — Ambulatory Visit (INDEPENDENT_AMBULATORY_CARE_PROVIDER_SITE_OTHER): Payer: PPO | Admitting: Family Medicine

## 2018-09-14 DIAGNOSIS — N390 Urinary tract infection, site not specified: Secondary | ICD-10-CM

## 2018-09-14 MED ORDER — CIPROFLOXACIN HCL 500 MG PO TABS: 500.0000 mg | ORAL_TABLET | Freq: Two times a day (BID) | ORAL | 0 refills | Status: DC

## 2018-09-14 NOTE — Progress Notes (Signed)
Patient presents today with dysuria and frequency. Patient states he has been on ABX 3 times in the last 2 months. He states he has a history of Prostate cancer and the symptoms he is having are the same as when he was diagnosed. He wants to be check to see if the cancer has returned. A urine was collected today for UA, UCX. [place] reviewed the UA and Cipro was sent to pharmacy.

## 2018-09-15 LAB — URINALYSIS, COMPLETE
Bilirubin, UA: NEGATIVE
GLUCOSE, UA: NEGATIVE
Ketones, UA: NEGATIVE
Nitrite, UA: POSITIVE — AB
Specific Gravity, UA: 1.025 (ref 1.005–1.030)
Urobilinogen, Ur: 0.2 mg/dL (ref 0.2–1.0)
pH, UA: 6 (ref 5.0–7.5)

## 2018-09-15 LAB — MICROSCOPIC EXAMINATION: WBC, UA: >30 /hpf — ABNORMAL HIGH (ref 0–5)

## 2018-09-18 ENCOUNTER — Telehealth: Payer: Self-pay | Admitting: Urology

## 2018-09-18 DIAGNOSIS — N401 Enlarged prostate with lower urinary tract symptoms: Secondary | ICD-10-CM

## 2018-09-18 LAB — CULTURE, URINE COMPREHENSIVE

## 2018-09-18 MED ORDER — TAMSULOSIN HCL 0.4 MG PO CAPS: 0.4000 mg | ORAL_CAPSULE | Freq: Two times a day (BID) | ORAL | 11 refills | Status: DC

## 2018-09-18 NOTE — Telephone Encounter (Signed)
Tamsulosin refill sent to [facility] [address]

## 2018-09-18 NOTE — Telephone Encounter (Signed)
Pt would like a RX refill for Tamsulosin sent to [facility] on [address].  He is out of medication.

## 2018-09-21 ENCOUNTER — Telehealth: Payer: Self-pay | Admitting: Family Medicine

## 2018-09-21 NOTE — Telephone Encounter (Signed)
-----   Message from Abbie Sons, MD sent at 09/18/2018  3:48 PM EST ----- Urine culture was positive and sensitive to Cipro

## 2018-09-21 NOTE — Telephone Encounter (Signed)
Patient notified and voiced understanding.

## 2018-10-06 ENCOUNTER — Encounter: Payer: Self-pay | Admitting: Urology

## 2018-10-06 ENCOUNTER — Ambulatory Visit: Payer: PPO | Admitting: Urology

## 2018-10-06 VITALS — BP 124/66 | HR 105 | Wt 234.0 lb

## 2018-10-06 DIAGNOSIS — N401 Enlarged prostate with lower urinary tract symptoms: Secondary | ICD-10-CM | POA: Diagnosis not present

## 2018-10-06 DIAGNOSIS — C61 Malignant neoplasm of prostate: Secondary | ICD-10-CM

## 2018-10-06 DIAGNOSIS — N390 Urinary tract infection, site not specified: Secondary | ICD-10-CM

## 2018-10-06 LAB — URINALYSIS, COMPLETE
Bilirubin, UA: NEGATIVE
Glucose, UA: NEGATIVE
Ketones, UA: NEGATIVE
NITRITE UA: POSITIVE — AB
PH UA: 5 (ref 5.0–7.5)
Specific Gravity, UA: >1.03 — ABNORMAL HIGH (ref 1.005–1.030)
UUROB: 0.2 mg/dL (ref 0.2–1.0)

## 2018-10-06 LAB — MICROSCOPIC EXAMINATION: Epithelial Cells (non renal): NONE SEEN /hpf (ref 0–10)

## 2018-10-06 MED ORDER — SULFAMETHOXAZOLE-TRIMETHOPRIM 800-160 MG PO TABS: 1.0000 | ORAL_TABLET | Freq: Two times a day (BID) | ORAL | 0 refills | Status: DC

## 2018-10-06 NOTE — Progress Notes (Signed)
10/06/18  Patient presented today for cystoscopy.  Unfortunately, he continues to be symptomatic with UTI type symptoms including urinary frequency and dysuria.  His urine remains grossly positive today concerning for ongoing bacterial infection.  He is also concerned today about a rise in his creatinine in the setting of infection last drawn in December/2019.  Assessment and plan:  1.  Chronic cystitis-we will go ahead and treat again with Bactrim and planned to reschedule his cystoscopy for next week on Bactrim.  Follow-up urine culture and adjust as needed.  In the setting of recurrent bacterial infections, will plan to also order renal ultrasound to assess his upper tracts and rule out other contributing factors.  He is agreeable this plan.  2.  History of prostate cancer/rising PSA-we will go ahead and repeat PSA today albeit we will need to interpret with caution in the setting of active UTI infection   [name], MD

## 2018-10-07 LAB — PSA: Prostate Specific Ag, Serum: 2.7 ng/mL (ref 0.0–4.0)

## 2018-10-08 LAB — CULTURE, URINE COMPREHENSIVE

## 2018-10-09 ENCOUNTER — Ambulatory Visit
Admission: RE | Admit: 2018-10-09 | Discharge: 2018-10-09 | Disposition: A | Discharge status: Home / Self Care | Payer: PPO | Source: Ambulatory Visit | Attending: Urology | Admitting: Urology

## 2018-10-09 DIAGNOSIS — C61 Malignant neoplasm of prostate: Secondary | ICD-10-CM | POA: Insufficient documentation

## 2018-10-09 DIAGNOSIS — N401 Enlarged prostate with lower urinary tract symptoms: Secondary | ICD-10-CM | POA: Diagnosis not present

## 2018-10-09 DIAGNOSIS — N390 Urinary tract infection, site not specified: Secondary | ICD-10-CM

## 2018-10-13 ENCOUNTER — Encounter: Payer: Self-pay | Admitting: Urology

## 2018-10-13 ENCOUNTER — Ambulatory Visit (INDEPENDENT_AMBULATORY_CARE_PROVIDER_SITE_OTHER): Payer: PPO | Admitting: Urology

## 2018-10-13 VITALS — BP 133/64 | HR 81 | Ht 67.0 in | Wt 230.0 lb

## 2018-10-13 DIAGNOSIS — N390 Urinary tract infection, site not specified: Secondary | ICD-10-CM

## 2018-10-13 DIAGNOSIS — N401 Enlarged prostate with lower urinary tract symptoms: Secondary | ICD-10-CM

## 2018-10-13 DIAGNOSIS — N2889 Other specified disorders of kidney and ureter: Secondary | ICD-10-CM

## 2018-10-13 NOTE — Progress Notes (Signed)
10/13/2018   CC:  Chief Complaint  Patient presents with  . Cysto    HPI: Earl Ramirez is a 78 yo M with a personal history of high risk prostate cancer, BPH with bladder outlet obstruction, bladder stones, and rUTIs returns today for a 1 month f/u for a cystoscopy for the evaluation and management of rUTIs.   He was initially diagnosed with high risk prostate cancer, Gleason +4 by Dr. Jacqlyn Larsen in 2016. Staging was negative for metastatic disease. The time, his PSA was 19. He underwent EBRT with 1 year of ADT. Over the past several years, his PSA has risen slowly but steadily.His PSA was 2.7 as of 10/06/18. The was in the setting of infection.   Since 6 months before his previous visit, he has been treated with 3 urinary tract infections. Each time he has urinary frequency, urgency, and burning along with dark-colored urine. He last rubbed off a urine 2 days ago on 07/22/2018 which was frankly positive. Its currently growing 100,000 colonies of gram-negative rods. He was started on Cipro for presumed urinary tract infection but he has not actually started the medication. He reports that when he got to the pharmacy, there were 2 different antibiotics both Bactrim and Cipro there. He was not sure which one he wanted to be filled so he filled the Cipro.   His most recent urine culture from 09/14/2018 was positive for Klebsiella pneumoniae.  He is currently on Bactrim DS twice daily.  His most recent PSA is 2.7 from 10/06/2018 and is concerning for reoccurrence of cancer. Does not meet criteria for biochemical reoccurrence.   RUS from 10/09/2018 showed a 2.2 cm upper renal pole mass likely complicated cyst.   Vitals:   10/13/18 1601  BP: 133/64  Pulse: 81   NED. A&Ox3.   No respiratory distress   Abd soft, NT, ND Normal phallus with bilateral descended testicles  Cystoscopy Procedure Note  Patient identification was confirmed, informed consent was obtained, and patient was prepped  using Betadine solution.  Lidocaine jelly was administered per urethral meatus.    Pre-Procedure: - Inspection reveals a normal caliber ureteral meatus.  Procedure: The flexible cystoscope was introduced without difficulty - No urethral strictures/lesions are present. -Irregular friable prostate with prostatic regrowth, friable mucosa, bleeding with manipulation and some adherent necrotic material bilaterally near the bladder neck -Slightly elevated bladder neck - Bilateral ureteral orifices identified - Bladder mucosa  reveals no ulcers, tumors, or lesions - No bladder stones - Trabeculated bladder slightly erythematous concurrent with reccurent cystitis  - Friable with bleeding manipulation  - TURP defect on retroflexion with some additional necrotic material.  Few small adherent calcifications, 2 to 3 mm stones  Post-Procedure: - Patient tolerated the procedure well  Assessment/ Plan:  1. rUTIs /abnormal prostate Multiple recurrent urinary tract infections over the past several months, currently on antibiotics Cystoscopic findings today with evidence of necrosis, prostatic regrowth, some small calcifications near the bladder neck as well as necrotic prostate material We discussed various options but at this point, would recommend consideration of return to the OR for a channel TURP to resect prostatic regrowth, necrotic material as well as small calcifications which may be contributing to recurrent infections/presence of biofilm which will also aid with tissue diagnosis if there is concern for possible prostate cancer recurrence Discussed risk of surgery including risk of bleeding, infection, damage surrounding structures, ejaculatory dysfunction, need for further procedures, need for overnight admission with CBI amongst others.  All questions  were answered.  He is agreeable this plan.  Plan to repeat urine culture prior to surgery, may need suppressive antibiotics prior to the  procedure to reduce the risk of infectious complications.  2. Elevated PSA  Most recent PSA was 2.7 Will monitor closely, f/u in 3 months with PSA   3. Renal mass RUS from 3/6 showed a 2.2 cm upper renal pole mass likely complicated cyst so recommended getting a MRI   I, Nethusan Sivanesan, am acting as a scribe for Dr. [name],  I have reviewed the above documentation for accuracy and completeness, and I agree with the above.   [name], MD

## 2018-10-14 LAB — URINALYSIS, COMPLETE
Bilirubin, UA: NEGATIVE
Glucose, UA: NEGATIVE
KETONES UA: NEGATIVE
LEUKOCYTES UA: NEGATIVE
NITRITE UA: NEGATIVE
PH UA: 5 (ref 5.0–7.5)
Urobilinogen, Ur: 0.2 mg/dL (ref 0.2–1.0)

## 2018-10-14 LAB — MICROSCOPIC EXAMINATION

## 2018-10-15 ENCOUNTER — Other Ambulatory Visit: Payer: Self-pay | Admitting: Radiology

## 2018-10-15 ENCOUNTER — Telehealth: Payer: Self-pay | Admitting: Radiology

## 2018-10-15 NOTE — Telephone Encounter (Signed)
Patient was given the [facility] Information form below as well as the Instructions for Pre-Admission Testing form & a map of [facility].   [place], [place],  [address] Telephone: [phone] Fax: [phone]   Thank you for choosing [facility] for your upcoming surgery!  We are always here to assist in your urological needs.  Please read the following information with specific details for your upcoming appointments related to your surgery. Please contact Declan Adamson at [phone] Option 3 with any questions.  The Name of Your Surgery: Channel Transurethral resection of the prostate Your Surgery Date: 12/09/2018 per patient request Your Surgeon: [name]  Please call Same Day Surgery at [phone] between the hours of 1pm-3pm one day prior to your surgery. They will inform you of the time to arrive at Same Day Surgery which is located on the second floor of the [facility].   Please refer to the attached letter regarding instructions for Pre-Admission Testing. You will receive a call from the [place] office regarding your appointment with them.  The Pre-Admission Testing office is located at [place], on the first floor of the [place] at [facility] in [place] (office is to the right as you enter through the [employer] of the [facility]). Please have all medications you are currently taking and your insurance card available.   Patient was advised to have nothing to eat or drink after midnight the night prior to surgery except that he may have only water until 2 hours before surgery with nothing to drink within 2 hours of surgery.  The patient states he currently takes aspirin 81mg  & was informed to hold medication for 7 days prior to surgery beginning on 10/02/2018. Patient's questions were answered  and he expressed understanding of these instructions.

## 2018-10-16 ENCOUNTER — Other Ambulatory Visit: Payer: Self-pay | Admitting: Radiology

## 2018-10-16 DIAGNOSIS — N401 Enlarged prostate with lower urinary tract symptoms: Secondary | ICD-10-CM

## 2018-10-16 DIAGNOSIS — N390 Urinary tract infection, site not specified: Secondary | ICD-10-CM

## 2018-10-18 ENCOUNTER — Other Ambulatory Visit: Payer: Self-pay

## 2018-10-18 ENCOUNTER — Ambulatory Visit
Admission: RE | Admit: 2018-10-18 | Discharge: 2018-10-18 | Disposition: A | Discharge status: Home / Self Care | Payer: PPO | Source: Ambulatory Visit | Attending: Urology | Admitting: Urology

## 2018-10-18 DIAGNOSIS — N2889 Other specified disorders of kidney and ureter: Secondary | ICD-10-CM | POA: Insufficient documentation

## 2018-10-18 DIAGNOSIS — N281 Cyst of kidney, acquired: Secondary | ICD-10-CM | POA: Diagnosis not present

## 2018-10-18 DIAGNOSIS — K8020 Calculus of gallbladder without cholecystitis without obstruction: Secondary | ICD-10-CM | POA: Diagnosis not present

## 2018-10-18 LAB — POCT I-STAT CREATININE: Creatinine, Ser: 0.7 mg/dL (ref 0.61–1.24)

## 2018-10-18 MED ORDER — GADOBUTROL 1 MMOL/ML IV SOLN
10.0000 mL | Freq: Once | INTRAVENOUS | Status: AC | PRN
  Administered 2018-10-18: 10 mL via INTRAVENOUS

## 2018-10-20 ENCOUNTER — Telehealth: Payer: Self-pay | Admitting: Urology

## 2018-10-20 DIAGNOSIS — R911 Solitary pulmonary nodule: Secondary | ICD-10-CM

## 2018-10-20 NOTE — Telephone Encounter (Signed)
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    Incidental chest nodule 12 mm right lung.  Findings were discussed with patient.  Would recommend chest CT and will have him follow-up with Dr. [name] who is seen in the past.  [name], MD

## 2018-10-21 ENCOUNTER — Other Ambulatory Visit: Payer: Self-pay | Admitting: *Deleted

## 2018-10-21 DIAGNOSIS — R911 Solitary pulmonary nodule: Secondary | ICD-10-CM

## 2018-10-29 ENCOUNTER — Telehealth: Payer: Self-pay | Admitting: Urology

## 2018-10-29 NOTE — Telephone Encounter (Signed)
Pt states he finished antibiotics about a week ago, pt now states he feels like his infection is back. Cloudy urine, some burning, urgency, foul odor.  Denies fever, chills, nausea, vomiting. Feels fine other wise. Please advise.   Thanks.

## 2018-10-30 MED ORDER — CIPROFLOXACIN HCL 500 MG PO TABS: 500.0000 mg | ORAL_TABLET | Freq: Two times a day (BID) | ORAL | 0 refills | Status: DC

## 2018-10-30 NOTE — Telephone Encounter (Signed)
We will plan to reschedule your surgery as soon as possible once the threat of COVID-19 has decreased.  I am hoping that this is going to be the most helpful in reducing the frequency of urinary tract infections.  Please call in another prescription for Cipro 500 mg twice daily for 7 days.  [name], MD

## 2018-10-30 NOTE — Telephone Encounter (Signed)
Patient notified and voiced understanding.

## 2018-10-30 NOTE — Telephone Encounter (Signed)
Patient states the Cipro works good to clear up infection it just keeps coming back. Last 7 day course was 09/14/18

## 2018-11-03 ENCOUNTER — Other Ambulatory Visit: Payer: Self-pay | Admitting: Oncology

## 2018-11-03 DIAGNOSIS — R911 Solitary pulmonary nodule: Secondary | ICD-10-CM

## 2018-11-03 NOTE — Progress Notes (Signed)
  Pulmonary Nodule Clinic Telephone Note  Received referral from PCP, Dr. Erlene Quan (Urology).   Per most recent guidelines and recommendations from [place] (2017), this patient requires a CT scan without contrast ASAP to further evaluate 12 mm right lung nodule.   Patient was recently evaluated by primary urologist Dr. Erlene Quan for a left renal mass.  Subsequently had MRI of abdomen revealing a 12 mm nodule at the right lung base.  No further imaging available for review. Per Fleischner guidelines (2017), CT scan without contrast is recommended ASAP.   High risk factors include: History of heavy smoking, exposure to asbestos, radium or uranium, personal family history of lung cancer, older age, sex (females greater than males), race (black and native [place] greater than weight), marginal speculation, upper lobe location, multiplicity (less than 5 nodules increases risk for malignancy) and emphysema and/or pulmonary fibrosis.   This recommendation follows the consensus statement: Guidelines for Management of Incidental Pulmonary Nodules Detected on CT Images: From the Fleischner Society 2017; Radiology 2017; 284:228-243.    I have placed order for CT scan without contrast to be completed ASAP for further evaluation of 12 mm lung nodule.  I would like him to see me in our Pulmonary Nodule Clinic after his CT scan on scheduled clinic day.   Faythe Casa, NP 09/16/2018 2:22 PM

## 2018-11-18 DIAGNOSIS — I359 Nonrheumatic aortic valve disorder, unspecified: Secondary | ICD-10-CM | POA: Diagnosis not present

## 2018-11-18 DIAGNOSIS — I4892 Unspecified atrial flutter: Secondary | ICD-10-CM | POA: Diagnosis not present

## 2018-11-23 DIAGNOSIS — M79605 Pain in left leg: Secondary | ICD-10-CM | POA: Diagnosis not present

## 2018-11-23 DIAGNOSIS — J42 Unspecified chronic bronchitis: Secondary | ICD-10-CM | POA: Diagnosis not present

## 2018-11-23 DIAGNOSIS — R600 Localized edema: Secondary | ICD-10-CM | POA: Diagnosis not present

## 2018-11-23 DIAGNOSIS — G4730 Sleep apnea, unspecified: Secondary | ICD-10-CM | POA: Diagnosis not present

## 2018-11-23 DIAGNOSIS — I4892 Unspecified atrial flutter: Secondary | ICD-10-CM | POA: Diagnosis not present

## 2018-11-23 DIAGNOSIS — I359 Nonrheumatic aortic valve disorder, unspecified: Secondary | ICD-10-CM | POA: Diagnosis not present

## 2018-11-23 DIAGNOSIS — M79604 Pain in right leg: Secondary | ICD-10-CM | POA: Diagnosis not present

## 2018-11-27 ENCOUNTER — Inpatient Hospital Stay: Admission: RE | Admit: 2018-11-27 | Payer: PPO | Source: Ambulatory Visit

## 2018-11-27 ENCOUNTER — Other Ambulatory Visit: Payer: PPO

## 2018-11-30 ENCOUNTER — Other Ambulatory Visit: Payer: Self-pay

## 2018-11-30 ENCOUNTER — Ambulatory Visit
Admission: RE | Admit: 2018-11-30 | Discharge: 2018-11-30 | Disposition: A | Discharge status: Home / Self Care | Payer: PPO | Source: Ambulatory Visit | Attending: Urology | Admitting: Urology

## 2018-11-30 DIAGNOSIS — R911 Solitary pulmonary nodule: Secondary | ICD-10-CM | POA: Diagnosis not present

## 2018-11-30 DIAGNOSIS — C61 Malignant neoplasm of prostate: Secondary | ICD-10-CM | POA: Diagnosis not present

## 2018-11-30 DIAGNOSIS — R918 Other nonspecific abnormal finding of lung field: Secondary | ICD-10-CM | POA: Diagnosis not present

## 2018-11-30 HISTORY — DX: Essential (primary) hypertension: I10

## 2018-11-30 LAB — POCT I-STAT CREATININE: Creatinine, Ser: 0.6 mg/dL — ABNORMAL LOW (ref 0.61–1.24)

## 2018-11-30 MED ORDER — IOHEXOL 300 MG/ML  SOLN
75.0000 mL | Freq: Once | INTRAMUSCULAR | Status: AC | PRN
  Administered 2018-11-30: 16:00:00 75 mL via INTRAVENOUS

## 2018-12-01 ENCOUNTER — Other Ambulatory Visit: Payer: Self-pay | Admitting: Radiology

## 2018-12-01 ENCOUNTER — Telehealth: Payer: Self-pay | Admitting: Urology

## 2018-12-01 DIAGNOSIS — N390 Urinary tract infection, site not specified: Secondary | ICD-10-CM

## 2018-12-01 DIAGNOSIS — R300 Dysuria: Secondary | ICD-10-CM

## 2018-12-01 DIAGNOSIS — R350 Frequency of micturition: Secondary | ICD-10-CM

## 2018-12-01 MED ORDER — SULFAMETHOXAZOLE-TRIMETHOPRIM 800-160 MG PO TABS: 1.0000 | ORAL_TABLET | Freq: Two times a day (BID) | ORAL | 0 refills | Status: DC

## 2018-12-01 MED ORDER — TRIMETHOPRIM 100 MG PO TABS: 100.0000 mg | ORAL_TABLET | Freq: Every day | ORAL | 0 refills | Status: DC

## 2018-12-01 NOTE — Telephone Encounter (Signed)
Lets great with Bactrim DS bid x 7 days then keep him on trimethoprim 100 only daily x 30 days.    [name], MD

## 2018-12-01 NOTE — Telephone Encounter (Signed)
Explained to patient that surgery will be postponed until it is safe to do so related to COVID-19 as previously discussed. Recommended repeat urine culture for symptoms of frequency, urgency, cloudy urine and intermittent mild burning. Patient prefers that an antibiotic be called in instead. Please advise.

## 2018-12-01 NOTE — Telephone Encounter (Signed)
Notified patient of scripts sent to pharmacy and to start trimethoprim after completion of Bactrim DS prescription. Patient expresses understanding of instructions.

## 2018-12-01 NOTE — Progress Notes (Signed)
Yes. I will certainly take it from here. The clinic is in limbo right now because of restrictions and I will make sure he gets appropriate follow-up. Thanks so much.   Sonia Baller

## 2018-12-01 NOTE — Telephone Encounter (Signed)
Pt is having surgery on 12/09/2018 and is pretty sure he has another UTI, He is having urgency leakage and cloudy urine also has an odor.Pt would like to know if something could be called in for him. Please advise.

## 2018-12-08 ENCOUNTER — Other Ambulatory Visit: Payer: Self-pay | Admitting: Oncology

## 2018-12-08 ENCOUNTER — Other Ambulatory Visit: Payer: Self-pay | Admitting: *Deleted

## 2018-12-08 DIAGNOSIS — R911 Solitary pulmonary nodule: Secondary | ICD-10-CM

## 2018-12-08 NOTE — Progress Notes (Signed)
Patient had repeat CT chest on 11/30/2018.  He was initially referred to our pulmonary nodule clinic by Dr. Erlene Quan for a incidental lung nodule found during a renal mass work-up. MRI revealed a 12 mm nodule at the right lung base. It was recommended he have a repeat CT scan ASAP.   Attempted to contact to set up for imaging. Left multiple voicemail's without response.   Based on most recent imaging, patient needs PET scan ASAP and to be placed on [facility] tumor board for 12/10/18. Orders placed. Patient will be notified of upcoming appts.   Faythe Casa, NP 12/08/2018 10:28 AM

## 2018-12-08 NOTE — Progress Notes (Signed)
He is scheduled to have PET scan for next week. This was the earliest based on patients availability. I think we will wait until next week to discuss at [facility] Tumor board until we have all of the imaging. Patients understands our concerns but is unable to have scan completed any sooner. Dr. [name] has also been alerted of findings.  Thanks again.  Rulon Abide, NP

## 2018-12-09 ENCOUNTER — Ambulatory Visit: Admission: RE | Admit: 2018-12-09 | Payer: PPO | Source: Home / Self Care | Admitting: Urology

## 2018-12-09 ENCOUNTER — Encounter: Admission: RE | Payer: Self-pay | Source: Home / Self Care

## 2018-12-09 SURGERY — TURP (TRANSURETHRAL RESECTION OF PROSTATE)
Anesthesia: Choice

## 2018-12-10 ENCOUNTER — Other Ambulatory Visit: Payer: PPO

## 2018-12-15 ENCOUNTER — Encounter
Admission: RE | Admit: 2018-12-15 | Discharge: 2018-12-15 | Disposition: A | Discharge status: Home / Self Care | Payer: PPO | Source: Ambulatory Visit | Attending: Oncology | Admitting: Oncology

## 2018-12-15 ENCOUNTER — Other Ambulatory Visit: Payer: Self-pay

## 2018-12-15 ENCOUNTER — Telehealth: Payer: Self-pay | Admitting: *Deleted

## 2018-12-15 DIAGNOSIS — R918 Other nonspecific abnormal finding of lung field: Secondary | ICD-10-CM | POA: Diagnosis not present

## 2018-12-15 DIAGNOSIS — K8020 Calculus of gallbladder without cholecystitis without obstruction: Secondary | ICD-10-CM | POA: Insufficient documentation

## 2018-12-15 DIAGNOSIS — E041 Nontoxic single thyroid nodule: Secondary | ICD-10-CM | POA: Diagnosis not present

## 2018-12-15 DIAGNOSIS — R911 Solitary pulmonary nodule: Secondary | ICD-10-CM | POA: Diagnosis not present

## 2018-12-15 LAB — GLUCOSE, CAPILLARY: Glucose-Capillary: 83 mg/dL (ref 70–99)

## 2018-12-15 MED ORDER — FLUDEOXYGLUCOSE F - 18 (FDG) INJECTION
11.9000 | Freq: Once | INTRAVENOUS | Status: AC | PRN
  Administered 2018-12-15: 12.9 via INTRAVENOUS

## 2018-12-15 NOTE — Telephone Encounter (Signed)
Called report  IMPRESSION: 1. Relatively low metabolic activity associated with the RIGHT middle lobe pulmonary nodule favors benign etiology. Recommend follow-up noncontrast CT exam in 3 months to evaluate size stability as certain neoplasms (e.g. carcinoid) can have low metabolic activity. 2. No mediastinal lymphadenopathy. 3. Hypermetabolic nodule in the RIGHT lobe of thyroid gland. Recommend ultrasound of the thyroid gland to evaluate for incidental thyroid carcinoma. 4. Incidental findings of splenomegaly and cholelithiasis.  These results will be called to the ordering clinician or representative by the Radiologist Assistant, and communication documented in the PACS or zVision Dashboard.   Electronically Signed   By: Suzy Bouchard M.D.   On: 12/15/2018 14:07

## 2018-12-16 NOTE — Progress Notes (Signed)
I can. I wasn't sure if we were waiting until after tumor board.

## 2018-12-16 NOTE — Progress Notes (Signed)
Results. Bridgett Larsson, NP 12/16/2018 11:47 AM

## 2018-12-16 NOTE — Telephone Encounter (Signed)
Do you want me to call him with results or wait until after tumor board tomorrow?  Faythe Casa, NP 12/16/2018 11:47 AM

## 2018-12-17 ENCOUNTER — Other Ambulatory Visit: Payer: PPO

## 2018-12-17 ENCOUNTER — Other Ambulatory Visit: Payer: Self-pay

## 2018-12-17 ENCOUNTER — Inpatient Hospital Stay: Payer: PPO | Attending: Oncology | Admitting: Oncology

## 2018-12-17 DIAGNOSIS — I4891 Unspecified atrial fibrillation: Secondary | ICD-10-CM

## 2018-12-17 DIAGNOSIS — Z87891 Personal history of nicotine dependence: Secondary | ICD-10-CM | POA: Diagnosis not present

## 2018-12-17 DIAGNOSIS — Z79899 Other long term (current) drug therapy: Secondary | ICD-10-CM

## 2018-12-17 DIAGNOSIS — Z7982 Long term (current) use of aspirin: Secondary | ICD-10-CM

## 2018-12-17 DIAGNOSIS — R911 Solitary pulmonary nodule: Secondary | ICD-10-CM | POA: Diagnosis not present

## 2018-12-17 DIAGNOSIS — I10 Essential (primary) hypertension: Secondary | ICD-10-CM

## 2018-12-17 DIAGNOSIS — D696 Thrombocytopenia, unspecified: Secondary | ICD-10-CM

## 2018-12-17 DIAGNOSIS — E041 Nontoxic single thyroid nodule: Secondary | ICD-10-CM

## 2018-12-17 NOTE — Progress Notes (Signed)
Pulmonary Nodule Clinic Consult note [facility]  Telephone:([phone] [phone]  Patient Care Team: Earl Hire, MD as PCP - General (Internal Medicine) Earl Redden, MD (Urology) Earl Spray, MD (Cardiology)   Name of the patient: Earl Ramirez  [id number]  [birth year]   I connected with Earl Ramirez on 12/17/18 at  3:00 PM EDT by telephone visit and verified that I am speaking with the correct person using two identifiers.   I discussed the limitations, risks, security and privacy concerns of performing an evaluation and management service by telemedicine and the availability of in-person appointments. I also discussed with the patient that there may be a patient responsible charge related to this service. The patient expressed understanding and agreed to proceed.   Other persons participating in the visit and their role in the encounter: None   Patients location: Home  Providers location: Office  Date of visit: 12/17/2018   Diagnosis- 1. Thyroid nodule - US THYROID; Future  2. Solitary pulmonary nodule - CT Chest Wo Contrast; Future   Chief complaint/ Reason for visit- Pulmonary Nodule Clinic Initial Visit  Past Medical History:  Patient referred by Dr. Erlene Ramirez for incidental lung nodule found during work-up for left renal mass.  Had MRI of abdomen revealing a 12 mm nodule at the right lung base.  CT without contrast was recommended ASAP.   CT without contrast completed on 11/30/2018 revealed a 1.4 cm lung nodule in the right middle lobe with a nodularity cluster thought to be infectious/inflammatory but primary bronchogenic carcinoma cannot be excluded.  Recommended a stat PET.  PET scan completed on 12/15/2018 revealed relatively low metabolic activity associated with right middle lobe pulmonary nodule favoring a benign etiology.  Recommended a follow-up with a noncontrast CT in 3 months to evaluate size and stability as certain  neoplasms such as carcinoid can have low metabolic activity.  Incidental hypermetabolic nodule in the right lobe of thyroid gland was identified.  A dedicated ultrasound of the thyroid was recommended.  Interval history- Earl Ramirez is a 78 year old male with personal history of high risk prostate cancer, BPH with bladder outlet obstruction, bladder stones, recurrent UTIs, A. fib, pancytopenia, aortic stenosis, COPD, peripheral neuropathy, sleep apnea, splenomegaly and thrombocytopenia.   His PCP is Dr. Edwina [facility] with [facility].  He was last seen 10/13/2018 for routine follow-up and medication review.  He appeared to be doing well and offered no complaints.  He is followed by Dr. Ubaldo Ramirez in cardiology.  Last seen on 11/23/2018.  Echo on 11/18/2018 revealed normal LV function and normal functioning aortic valve.  Moderate MR, mild AR, TR and PR.  He is status post cardioversion on 11/09/2001.  Managed well with metoprolol.  Not currently on anticoagulation.   He was last evaluated by Dr. Erlene Ramirez in urology on 10/13/2018 for a 1 month follow-up after cystoscopy for management of recurrent UTIs.  Cystoscopy findings revealed necrosis, prostatic regrowth and some small calcifications near the bladder neck that could be contributing to recurrent infections.  Recommendations were for surgery for a channel TURP to resect.  Incidental renal mass discovered. Surgery postponed d/t Covid.   MRI revealed simple renal cyst.  No further interventions needed.  He has a family history of breast cancer on maternal side.  Earl Ramirez is retired and lives at home with his wife.  He served in [employer] for many years and does not know of any known exposures to cancer related agents.  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  Earl Ramirez smoked 1 pack/day for 45 years.  He quit in 2002.  His wife also smoked.  Today, Earl Ramirez is doing well.   He states he has chronic sinusitis with congestion in the morning that resolves on its own by late afternoon.  Currently not taking antihistamines due to cardiac concerns.  Currently taking trimethoprim prescribed by Dr. Erlene Ramirez for recurrent UTIs which he will take until surgery.  He denies any fevers or cough.  He denies any interval infection. Denies any easy bleeding or bruising. Reports good appetite and denies weight loss. Denies chest pain. Denies any nausea, vomiting, constipation, or diarrhea. Denies urinary complaints. Patient offers no further specific complaints today.  ECOG FS:1 - Symptomatic but completely ambulatory  Review of systems- Review of Systems  Constitutional: Positive for malaise/fatigue. Negative for chills, fever and weight loss.  HENT: Positive for congestion (In the morning). Negative for ear pain and tinnitus.   Eyes: Negative.  Negative for blurred vision and double vision.  Respiratory: Positive for shortness of breath (With exertion). Negative for cough and sputum production.   Cardiovascular: Negative.  Negative for chest pain, palpitations and leg swelling.  Gastrointestinal: Negative.  Negative for abdominal pain, constipation, diarrhea, nausea and vomiting.  Genitourinary: Negative for dysuria, frequency and urgency.       Recurrent UTIs  Musculoskeletal: Negative for back pain and falls.  Skin: Negative.  Negative for rash.  Neurological: Negative.  Negative for weakness and headaches.  Endo/Heme/Allergies: Negative.  Does not bruise/bleed easily.  Psychiatric/Behavioral: Negative.  Negative for depression. The patient is not nervous/anxious and does not have insomnia.      Allergies  Allergen Reactions   Oxycodone-Acetaminophen Nausea Only and Shortness Of Breath   Amoxicillin Itching    Hand itching   Penicillins Rash    Did it involve swelling of the face/tongue/throat, SOB, or low BP? No Did it involve sudden or severe rash/hives, skin  peeling, or any reaction on the inside of your mouth or nose? No Did you need to seek medical attention at a hospital or doctor's office? No When did it last happen?4-5 years ago If all above answers are NO, may proceed with cephalosporin use.      Past Medical History:  Diagnosis Date   Aortic valve replaced    1998   Arthritis    Atrial fibrillation (HCC)    COPD (chronic obstructive pulmonary disease) (HCC)    Hernia of abdominal wall    Hypertension    Indigestion    Neuropathy    Prostate cancer ([place])    Sleep apnea    Thrombocytopenia (HCC)    Tremors of nervous system      Past Surgical History:  Procedure Laterality Date   CALDWELL LUC      Social History   Socioeconomic History   Marital status: Married    Spouse name: Not on file   Number of children: Not on file   Years of education: Not on file   Highest education level: Not on file  Occupational History   Not on file  Social Needs   Financial resource strain: Not on file   Food insecurity:    Worry: Not on file    Inability: Not on file   Transportation needs:    Medical: Not on file    Non-medical: Not on file  Tobacco Use  Smoking status: Former Smoker    Types: Cigarettes   Smokeless tobacco: Former Systems developer    Quit date: 09/24/2000  Substance and Sexual Activity   Alcohol use: Yes    Alcohol/week: 2.0 standard drinks    Types: 2 Cans of beer per week   Drug use: No   Sexual activity: Not on file  Lifestyle   Physical activity:    Days per week: Not on file    Minutes per session: Not on file   Stress: Not on file  Relationships   Social connections:    Talks on phone: Not on file    Gets together: Not on file    Attends religious service: Not on file    Active member of club or organization: Not on file    Attends meetings of clubs or organizations: Not on file    Relationship status: Not on file   Intimate partner violence:    Fear of current  or ex partner: Not on file    Emotionally abused: Not on file    Physically abused: Not on file    Forced sexual activity: Not on file  Other Topics Concern   Not on file  Social History Narrative   Not on file    Family History  Problem Relation Age of Onset   Heart attack Father    Alcohol abuse Father    Congestive Heart Failure Mother    Alzheimer's disease Sister      Current Outpatient Medications:    ALPRAZolam (XANAX) 0.25 MG tablet, Take 0.25 mg by mouth daily as needed for anxiety. , Disp: , Rfl:    aspirin EC 81 MG tablet, Take 81 mg by mouth daily. , Disp: , Rfl:    docusate sodium (COLACE) 100 MG capsule, Take 100 mg by mouth daily as needed for moderate constipation. , Disp: , Rfl:    gabapentin (NEURONTIN) 300 MG capsule, Take 900 mg by mouth 2 (two) times daily. , Disp: , Rfl:    metoprolol succinate (TOPROL-XL) 25 MG 24 hr tablet, Take 25 mg by mouth daily. , Disp: , Rfl:    sulfamethoxazole-trimethoprim (BACTRIM DS) 800-160 MG tablet, Take 1 tablet by mouth every 12 (twelve) hours., Disp: 14 tablet, Rfl: 0   tamsulosin (FLOMAX) 0.4 MG CAPS capsule, Take 1 capsule (0.4 mg total) by mouth 2 (two) times daily., Disp: 60 capsule, Rfl: 11   trimethoprim (TRIMPEX) 100 MG tablet, Take 1 tablet (100 mg total) by mouth daily., Disp: 30 tablet, Rfl: 0  Physical exam: There were no vitals filed for this visit. Physical Exam  Virtual Visit   CMP Latest Ref Rng & Units 11/30/2018  Creatinine 0.61 - 1.24 mg/dL 0.60(L)   CBC Latest Ref Rng & Units 12/11/2016  WBC 3.8 - 10.6 K/uL 2.1(L)  Hemoglobin 13.0 - 18.0 g/dL 13.5  Hematocrit 40.0 - 52.0 % 39.1(L)  Platelets 150 - 440 K/uL 47(L)    No images are attached to the encounter.  Ct Chest W Contrast  Result Date: 12/01/2018 CLINICAL DATA:  Lung nodule. Increasing shortness of breath. Intermittent chest pain. Prostate cancer diagnosed 4 years ago. Valve replacement 20 years ago. EXAM: CT CHEST WITH CONTRAST  TECHNIQUE: Multidetector CT imaging of the chest was performed during intravenous contrast administration. CONTRAST:  38mL OMNIPAQUE IOHEXOL 300 MG/ML  SOLN COMPARISON:  Abdominal MRI of 10/18/2018. FINDINGS: Cardiovascular: Advanced aortic and branch vessel atherosclerosis. Tortuous thoracic aorta. Borderline to mild ascending aortic dilatation. Example at 4.0 cm on  coronal image 73 and image 80. Mild cardiomegaly. Prior median sternotomy. No central pulmonary embolism, on this non-dedicated study. Mediastinum/Nodes: No supraclavicular adenopathy. Small right sided thyroid nodules are nonspecific. No mediastinal or hilar adenopathy. Periesophageal varices. Lungs/Pleura: No pleural fluid.  Mild centrilobular emphysema. Minimal motion degradation inferiorly. Tiny bilateral upper lobe pulmonary nodules are identified on series 3. A 9 mm left upper lobe pulmonary nodule on image 64/3 is relatively well-circumscribed. Corresponding to the MRI abnormality, within the right middle lobe, is a 1.5 x 1.0 cm relatively well-circumscribed nodule on image 99/3. Just caudal to this is clustered ill-defined nodularity just likely post infectious/inflammatory. Example image 106/3. Upper Abdomen: Cirrhosis. Incompletely imaged splenomegaly. 2.8 cm gallstone. Periampullary duodenal diverticulum. Normal imaged portions of the adrenal glands and kidneys. Mild motion degradation continuing into the upper abdomen. Musculoskeletal: No acute osseous abnormality. IMPRESSION: 1. Bilateral pulmonary nodules, including the nodule of concern in the right middle lobe of maximally 1.4 cm. Although adjacent clustered nodularity suggests that this may be infectious/inflammatory, primary bronchogenic carcinoma cannot be excluded. Recommend multidisciplinary thoracic oncology consultation for possible PET. 2. No thoracic adenopathy. 3. Cirrhosis and portal venous hypertension. 4.  Aortic Atherosclerosis (ICD10-I70.0). 5. Borderline to mild ascending  aortic dilatation. Recommend annual imaging followup by CTA or MRA. This recommendation follows 2010 ACCF/AHA/AATS/ACR/ASA/SCA/SCAI/SIR/STS/SVM Guidelines for the Diagnosis and Management of Patients with Thoracic Aortic Disease. Circulation. 2010; 121: A416-S063. Aortic aneurysm NOS (ICD10-I71.9) 6. Cholelithiasis. 7. Mild motion degradation. Electronically Signed   By: Abigail Miyamoto M.D.   On: 12/01/2018 09:14   Nm Pet Image Initial (pi) Skull Base To Thigh  Result Date: 12/15/2018 CLINICAL DATA:  initial treatment strategy for tumor type. EXAM: NUCLEAR MEDICINE PET SKULL BASE TO THIGH TECHNIQUE: 12.9 mCi F-18 FDG was injected intravenously. Full-ring PET imaging was performed from the skull base to thigh after the radiotracer. CT data was obtained and used for attenuation correction and anatomic localization. Fasting blood glucose: 83 mg/dl COMPARISON:  Chest CT 11/30/2018 FINDINGS: Mediastinal blood pool activity: SUV max 2.05 NECK: No hypermetabolic lymph nodes in the neck. Hypermetabolic nodule in the RIGHT lobe of the thyroid gland measures 16 mm with SUV max equal 5.3. Incidental CT findings: none CHEST: Nodule within the RIGHT middle lobe measures 16 mm (image 016/0) has low metabolic activity SUV max equal 2.1. No hypermetabolic mediastinal lymph nodes. Incidental CT findings: Coronary artery calcification and aortic atherosclerotic calcification. ABDOMEN/PELVIS: No abnormal hypermetabolic activity within the liver, pancreas, adrenal glands, or spleen. No hypermetabolic lymph nodes in the abdomen or pelvis. Incidental CT findings: Spleen is enlarged measuring 17 cm in craniocaudad dimension. Several large gallstones are present. SKELETON: No focal hypermetabolic activity to suggest skeletal metastasis. Incidental CT findings: none IMPRESSION: 1. Relatively low metabolic activity associated with the RIGHT middle lobe pulmonary nodule favors benign etiology. Recommend follow-up noncontrast CT exam in 3  months to evaluate size stability as certain neoplasms (e.g. carcinoid) can have low metabolic activity. 2. No mediastinal lymphadenopathy. 3. Hypermetabolic nodule in the RIGHT lobe of thyroid gland. Recommend ultrasound of the thyroid gland to evaluate for incidental thyroid carcinoma. 4. Incidental findings of splenomegaly and cholelithiasis. These results will be called to the ordering clinician or representative by the Radiologist Assistant, and communication documented in the PACS or zVision Dashboard. Electronically Signed   By: Suzy Bouchard M.D.   On: 12/15/2018 14:07    Assessment and plan- Patient is a 78 y.o. male who presents for follow-up of incidental 12 mm right lung nodule discovered  during work-up for renal mass.  He is here for results.  CT without contrast completed on 11/30/2018 revealed a 1.4 cm lung nodule in the right middle lobe with a nodularity cluster thought to be infectious/inflammatory but primary bronchogenic carcinoma cannot be excluded.  Recommended a stat PET.  PET scan completed on 12/15/2018 revealed relatively low metabolic activity associated with right middle lobe pulmonary nodule favoring a benign etiology.  Recommended a follow-up with a noncontrast CT in 3 months to evaluate size and stability as certain neoplasms such as carcinoid can have low metabolic activity.  Incidental hypermetabolic nodule in the right lobe of thyroid gland was identified.  A dedicated ultrasound of the thyroid was recommended.  Case discussed with Dr. [name].  3 to 45-month follow-up with noncontrasted CT was considered reasonable for this patient given low metabolic activity.  Dr. [name] agreed with ENT referral and thyroid ultrasound.  Calculating malignancy probability of a pulmonary nodule: Risk factors include:  1.  Age. 2.  Cancer history. 3.  Diameter of pulmonary nodule and mm 4.  Location 5.  Smoking history 6.  Spiculation present   Based on risk factors, this  patient is high risk for the development of lung cancer.  I would recommend a 3-39-month follow-up with imaging to ensure stability given smoking history.  If imaging stable in 5-months, would recommend follow-up with annual low-dose CT screening or if patient does not meet low-dose lung screening he can continue an hour lung nodule program.   During our visit, we discussed pulmonary nodules are a common incidental finding and are often how lung cancer is discovered.  Lung cancer survival is directly related to the stage at diagnosis.  We discussed that nodules can vary in presentation from solitary pulmonary nodules to masses, 2 groundglass opacities and multiple nodules.  Pulmonary nodules in the majority of cases are benign but the probability of these becoming malignant cannot be undermined.  Early identification of malignant nodules could lead to early diagnosis and increased survival.   We discussed the probability of pulmonary nodules becoming malignant increase with age, pack years of tobacco use, size/characteristics of the nodule and location; with upper lobe involvement being most worrisome.   We discussed the goal of our clinic is to thoroughly evaluate each nodule, developed a comprehensive, individualized plan of care utilizing the most advanced technology and significantly reduce the time from detection to treatment.  A dedicated pulmonary nodule clinic has proven to indeed expedite the detection and treatment of lung cancer.   Patient education in fact sheet provided along with most recent CT scans.  Disposition: 1. RTC in 3 months for repeat CT scan without contrast to ensure stability of lung nodule.  Patient is agreeable.  Given patient has no symptoms of respiratory infection, will hold off on antibiotics at this time.  He knows to call clinic if he develops worsening shortness of breath, productive cough, fever and/or any change that is concerning. 2. Referral to ENT for incidental  thyroid nodule. 3. Thyroid ultrasound ordered.   CC: Dr. Erlene Ramirez, Dr. Wynetta Emery and Dr. Tami Ribas  Visit Diagnosis 1. Thyroid nodule   2. Solitary pulmonary nodule    I provided 25 minutes of non face-to-face telephone visit time during this encounter, and > 50% was spent counseling as documented under my assessment & plan.  Patient expressed understanding and was in agreement with this plan. He also understands that He can call clinic at any time with any questions, concerns, or complaints.  Thank you for allowing me to participate in the care of this very pleasant patient.   Jacquelin Hawking, NP [place] [facility] - [phone] Pager- [phone] 12/17/2018

## 2018-12-17 NOTE — Addendum Note (Signed)
Addended by: Faythe Casa E on: 12/17/2018 11:41 AM   Modules accepted: Orders

## 2018-12-17 NOTE — Progress Notes (Signed)
Tumor Board Documentation  Earl Ramirez was presented by Dr [name] at our Tumor Board on 12/17/2018, which included representatives from medical oncology, radiation oncology, navigation, pathology, radiology, surgical, surgical oncology, internal medicine, genetics, palliative care, research.  Earl Ramirez currently presents as a new patient, for discussion, for new tumor(s) with history of the following treatments: active survellience.  Additionally, we reviewed previous medical and familial history, history of present illness, and recent lab results along with all available histopathologic and imaging studies. The tumor board considered available treatment options and made the following recommendations:   Refer to ENT for evaluation of Thyroid tumor  The following procedures/referrals were also placed: No orders of the defined types were placed in this encounter.   Clinical Trial Status: not discussed   Staging used:    National site-specific guidelines   were discussed with respect to the case.  Tumor board is a meeting of clinicians from various specialty areas who evaluate and discuss patients for whom a multidisciplinary approach is being considered. Final determinations in the plan of care are those of the provider(s). The responsibility for follow up of recommendations given during tumor board is that of the provider.   Today's extended care, comprehensive team conference, Earl Ramirez was not present for the discussion and was not examined.   Multidisciplinary Tumor Board is a multidisciplinary case peer review process.  Decisions discussed in the Multidisciplinary Tumor Board reflect the opinions of the specialists present at the conference without having examined the patient.  Ultimately, treatment and diagnostic decisions rest with the primary provider(s) and the patient.

## 2018-12-24 ENCOUNTER — Other Ambulatory Visit: Payer: PPO

## 2018-12-24 NOTE — Progress Notes (Signed)
Tumor Board Documentation  Earl Ramirez was presented by Dr [name] at our Tumor Board on 12/24/2018, which included representatives from medical oncology, radiation oncology, surgical oncology, surgical, radiology, pathology, navigation, research, palliative care, pulmonology.  Earl Ramirez currently presents as a current patient, for discussion with history of the following treatments: active survellience.  Additionally, we reviewed previous medical and familial history, history of present illness, and recent lab results along with all available histopathologic and imaging studies. The tumor board considered available treatment options and made the following recommendations: Biopsy US guided Biopsyvs ENT biopsy  The following procedures/referrals were also placed: No orders of the defined types were placed in this encounter.   Clinical Trial Status: not discussed   Staging used:    National site-specific guidelines   were discussed with respect to the case.  Tumor board is a meeting of clinicians from various specialty areas who evaluate and discuss patients for whom a multidisciplinary approach is being considered. Final determinations in the plan of care are those of the provider(s). The responsibility for follow up of recommendations given during tumor board is that of the provider.   Today's extended care, comprehensive team conference, Earl Ramirez was not present for the discussion and was not examined.   Multidisciplinary Tumor Board is a multidisciplinary case peer review process.  Decisions discussed in the Multidisciplinary Tumor Board reflect the opinions of the specialists present at the conference without having examined the patient.  Ultimately, treatment and diagnostic decisions rest with the primary provider(s) and the patient.

## 2019-01-04 ENCOUNTER — Other Ambulatory Visit: Payer: Self-pay | Admitting: Radiology

## 2019-01-04 DIAGNOSIS — N390 Urinary tract infection, site not specified: Secondary | ICD-10-CM

## 2019-01-04 DIAGNOSIS — N401 Enlarged prostate with lower urinary tract symptoms: Secondary | ICD-10-CM

## 2019-01-06 ENCOUNTER — Telehealth: Payer: Self-pay | Admitting: Radiology

## 2019-01-06 NOTE — Telephone Encounter (Signed)
discussed the [facility] Information form below with patient over the phone.   [place], [address], [place] [address] Telephone: [phone] Fax: [phone]   Thank you for choosing [facility] for your upcoming surgery!  We are always here to assist in your urological needs.  Please read the following information with specific details for your upcoming appointments related to your surgery. Please contact Rogue Rafalski at [phone] Option 3 with any questions.  The Name of Your Surgery: Channel Transurethral resection of prostate Your Surgery Date: 01/18/2019 Your Surgeon: [name]  Please call Same Day Surgery at [phone] between the hours of 1pm-3pm one day prior to your surgery. They will inform you of the time to arrive at Same Day Surgery which is located on the second floor of the [facility].   Please refer to the attached letter regarding instructions for Pre-Admission Testing. You will receive a call from the [facility] office regarding your appointment with them.  The Pre-Admission Testing office is located at [place], on the first floor of the [facility] in [place] (office is to the right as you enter through the [employer] of the [facility]). Please have all medications you are currently taking and your insurance card available. You will also be required to take a COVID-19 test prior to surgery and remain in quarantine until the day of surgery.   Patient was advised to have nothing to eat or drink after midnight the night prior to surgery except that he may have only water until 2 hours before surgery with nothing to drink within 2 hours of surgery.  The patient states he currently takes aspirin 81mg  & was informed to hold medication for 7 days prior to surgery beginning on  01/11/2019. Patient's questions were answered and he expressed understanding of these instructions.

## 2019-01-08 ENCOUNTER — Other Ambulatory Visit: Payer: Self-pay | Admitting: Family Medicine

## 2019-01-08 DIAGNOSIS — N401 Enlarged prostate with lower urinary tract symptoms: Secondary | ICD-10-CM

## 2019-01-11 ENCOUNTER — Other Ambulatory Visit: Payer: PPO

## 2019-01-11 ENCOUNTER — Other Ambulatory Visit: Payer: Self-pay

## 2019-01-11 DIAGNOSIS — N401 Enlarged prostate with lower urinary tract symptoms: Secondary | ICD-10-CM | POA: Diagnosis not present

## 2019-01-11 LAB — URINALYSIS, COMPLETE
Bilirubin, UA: NEGATIVE
Glucose, UA: NEGATIVE
Ketones, UA: NEGATIVE
Leukocytes,UA: NEGATIVE
Nitrite, UA: NEGATIVE
RBC, UA: NEGATIVE
Specific Gravity, UA: 1.025 (ref 1.005–1.030)
Urobilinogen, Ur: 0.2 mg/dL (ref 0.2–1.0)
pH, UA: 5.5 (ref 5.0–7.5)

## 2019-01-11 LAB — MICROSCOPIC EXAMINATION
Bacteria, UA: NONE SEEN
RBC, Urine: NONE SEEN /hpf (ref 0–2)

## 2019-01-13 LAB — CULTURE, URINE COMPREHENSIVE

## 2019-01-14 ENCOUNTER — Other Ambulatory Visit: Payer: Self-pay

## 2019-01-14 ENCOUNTER — Encounter
Admission: RE | Admit: 2019-01-14 | Discharge: 2019-01-14 | Disposition: A | Discharge status: Home / Self Care | Payer: PPO | Source: Ambulatory Visit | Attending: Urology | Admitting: Urology

## 2019-01-14 DIAGNOSIS — Z01818 Encounter for other preprocedural examination: Secondary | ICD-10-CM | POA: Diagnosis not present

## 2019-01-14 DIAGNOSIS — R9431 Abnormal electrocardiogram [ECG] [EKG]: Secondary | ICD-10-CM | POA: Diagnosis not present

## 2019-01-14 DIAGNOSIS — I10 Essential (primary) hypertension: Secondary | ICD-10-CM | POA: Diagnosis not present

## 2019-01-14 DIAGNOSIS — Z1159 Encounter for screening for other viral diseases: Secondary | ICD-10-CM | POA: Insufficient documentation

## 2019-01-14 HISTORY — DX: Other symptoms and signs involving the genitourinary system: R39.89

## 2019-01-14 HISTORY — DX: Cardiac arrhythmia, unspecified: I49.9

## 2019-01-14 LAB — BASIC METABOLIC PANEL
Anion gap: 6 (ref 5–15)
BUN: 22 mg/dL (ref 8–23)
CO2: 23 mmol/L (ref 22–32)
Calcium: 8.7 mg/dL — ABNORMAL LOW (ref 8.9–10.3)
Chloride: 112 mmol/L — ABNORMAL HIGH (ref 98–111)
Creatinine, Ser: 0.57 mg/dL — ABNORMAL LOW (ref 0.61–1.24)
GFR calc Af Amer: >60 mL/min (ref >60)
GFR calc non Af Amer: >60 mL/min (ref >60)
Glucose, Bld: 115 mg/dL — ABNORMAL HIGH (ref 70–99)
Potassium: 3.7 mmol/L (ref 3.5–5.1)
Sodium: 141 mmol/L (ref 135–145)

## 2019-01-14 LAB — CBC
HCT: 38.6 % — ABNORMAL LOW (ref 39.0–52.0)
Hemoglobin: 12.5 g/dL — ABNORMAL LOW (ref 13.0–17.0)
MCH: 33.1 pg (ref 26.0–34.0)
MCHC: 32.4 g/dL (ref 30.0–36.0)
MCV: 102.1 fL — ABNORMAL HIGH (ref 80.0–100.0)
Platelets: 41 10*3/uL — ABNORMAL LOW (ref 150–400)
RBC: 3.78 MIL/uL — ABNORMAL LOW (ref 4.22–5.81)
RDW: 13.5 % (ref 11.5–15.5)
WBC: 1.5 10*3/uL — ABNORMAL LOW (ref 4.0–10.5)
nRBC: 0 % (ref 0.0–0.2)

## 2019-01-14 LAB — URINALYSIS, ROUTINE W REFLEX MICROSCOPIC
Bacteria, UA: NONE SEEN
Bilirubin Urine: NEGATIVE
Glucose, UA: NEGATIVE mg/dL
Hgb urine dipstick: NEGATIVE
Ketones, ur: NEGATIVE mg/dL
Leukocytes,Ua: NEGATIVE
Nitrite: NEGATIVE
Protein, ur: 30 mg/dL — AB
Specific Gravity, Urine: 1.021 (ref 1.005–1.030)
pH: 5 (ref 5.0–8.0)

## 2019-01-14 NOTE — Patient Instructions (Signed)
Your procedure is scheduled on: 01/18/2019 Mon Report to Same Day Surgery 2nd floor medical mall [facility] Entrance-take elevator on left to 2nd floor.  Check in with surgery information desk.) To find out your arrival time please call [phone] between 1PM - 3PM on 01/15/2019 Fri  Remember: Instructions that are not followed completely may result in serious medical risk, up to and including death, or upon the discretion of your surgeon and anesthesiologist your surgery may need to be rescheduled.    _x___ 1. Do not eat food after midnight the night before your procedure. You may drink clear liquids up to 2 hours before you are scheduled to arrive at the hospital for your procedure.  Do not drink clear liquids within 2 hours of your scheduled arrival to the hospital.  Clear liquids include  --Water or Apple juice without pulp  --Clear carbohydrate beverage such as ClearFast or Gatorade  --Black Coffee or Clear Tea (No milk, no creamers, do not add anything to                  the coffee or Tea Type 1 and type 2 diabetics should only drink water.   ____Ensure clear carbohydrate drink on the way to the hospital for bariatric patients  ____Ensure clear carbohydrate drink 3 hours before surgery for Dr Dwyane Luo patients if physician instructed.   No gum chewing or hard candies.     __x__ 2. No Alcohol for 24 hours before or after surgery.   __x__3. No Smoking or e-cigarettes for 24 prior to surgery.  Do not use any chewable tobacco products for at least 6 hour prior to surgery   ____  4. Bring all medications with you on the day of surgery if instructed.    __x__ 5. Notify your doctor if there is any change in your medical condition     (cold, fever, infections).    x___6. On the morning of surgery brush your teeth with toothpaste and water.  You may rinse your mouth with mouth wash if you wish.  Do not swallow any toothpaste or mouthwash.   Do not wear jewelry, make-up, hairpins,  clips or nail polish.  Do not wear lotions, powders, or perfumes. You may wear deodorant.  Do not shave 48 hours prior to surgery. Men may shave face and neck.  Do not bring valuables to the hospital.    [facility] is not responsible for any belongings or valuables.               Contacts, dentures or bridgework may not be worn into surgery.  Leave your suitcase in the car. After surgery it may be brought to your room.  For patients admitted to the hospital, discharge time is determined by your                       treatment team.  _  Patients discharged the day of surgery will not be allowed to drive home.  You will need someone to drive you home and stay with you the night of your procedure.    Please read over the following fact sheets that you were given:   [facility] Preparing for Surgery and or MRSA Information   _x___ Take anti-hypertensive listed below, cardiac, seizure, asthma,     anti-reflux and psychiatric medicines. These include:  1. ALPRAZolam (XANAX) 0.25 MG tablet  2.gabapentin (NEURONTIN  3.metoprolol succinate (TOPROL-XL) 25 MG 24 hr tablet  4.tamsulosin (  FLOMAX) 0.4 MG CAPS capsule  5.  6.  ____Fleets enema or Magnesium Citrate as directed.   ____ Use CHG Soap or sage wipes as directed on instruction sheet   ____ Use inhalers on the day of surgery and bring to hospital day of surgery  ____ Stop Metformin and Janumet 2 days prior to surgery.    ____ Take 1/2 of usual insulin dose the night before surgery and none on the morning     surgery.   _x___ Follow recommendations from Cardiologist, Pulmonologist or PCP regarding          stopping Aspirin, Coumadin, Plavix ,Eliquis, Effient, or Pradaxa, and Pletal.  X____Stop Anti-inflammatories such as Advil, Aleve, Ibuprofen, Motrin, Naproxen, Naprosyn, Goodies powders or aspirin products. OK to take Tylenol and                          Celebrex.   _x___ Stop supplements until after surgery.  But may continue  Vitamin D, Vitamin B,       and multivitamin.   ____ Bring C-Pap to the hospital.

## 2019-01-15 LAB — URINE CULTURE: Culture: NO GROWTH

## 2019-01-15 LAB — NOVEL CORONAVIRUS, NAA (HOSP ORDER, SEND-OUT TO REF LAB; TAT 18-24 HRS): SARS-CoV-2, NAA: NOT DETECTED

## 2019-01-15 MED ORDER — LACTATED RINGERS IV SOLN: INTRAVENOUS | Status: DC

## 2019-01-15 NOTE — Pre-Procedure Instructions (Signed)
Met B, CBC, and UA results sent to Dr. Erlene Quan and Anesthesia for review.

## 2019-01-16 ENCOUNTER — Encounter: Payer: Self-pay | Admitting: Anesthesiology

## 2019-01-17 MED ORDER — SODIUM CHLORIDE 0.9 % IV SOLN
1.0000 g | INTRAVENOUS | Status: AC
  Administered 2019-01-18: 1 g via INTRAVENOUS
  Filled 2019-01-17: qty 1

## 2019-01-18 ENCOUNTER — Observation Stay
Admission: RE | Admit: 2019-01-18 | Discharge: 2019-01-19 | Disposition: A | Discharge status: Home / Self Care | Payer: PPO | Attending: Urology | Admitting: Urology

## 2019-01-18 ENCOUNTER — Ambulatory Visit: Payer: PPO | Admitting: Anesthesiology

## 2019-01-18 ENCOUNTER — Encounter: Payer: Self-pay | Admitting: Emergency Medicine

## 2019-01-18 ENCOUNTER — Encounter
Admission: RE | Disposition: A | Discharge status: Home / Self Care | Payer: Self-pay | Source: Home / Self Care | Attending: Urology

## 2019-01-18 DIAGNOSIS — R300 Dysuria: Secondary | ICD-10-CM

## 2019-01-18 DIAGNOSIS — N390 Urinary tract infection, site not specified: Secondary | ICD-10-CM

## 2019-01-18 DIAGNOSIS — E669 Obesity, unspecified: Secondary | ICD-10-CM | POA: Diagnosis not present

## 2019-01-18 DIAGNOSIS — Z6835 Body mass index (BMI) 35.0-35.9, adult: Secondary | ICD-10-CM | POA: Diagnosis not present

## 2019-01-18 DIAGNOSIS — N2889 Other specified disorders of kidney and ureter: Secondary | ICD-10-CM | POA: Insufficient documentation

## 2019-01-18 DIAGNOSIS — N401 Enlarged prostate with lower urinary tract symptoms: Secondary | ICD-10-CM | POA: Diagnosis not present

## 2019-01-18 DIAGNOSIS — J449 Chronic obstructive pulmonary disease, unspecified: Secondary | ICD-10-CM | POA: Diagnosis not present

## 2019-01-18 DIAGNOSIS — Z7982 Long term (current) use of aspirin: Secondary | ICD-10-CM | POA: Insufficient documentation

## 2019-01-18 DIAGNOSIS — Z8546 Personal history of malignant neoplasm of prostate: Secondary | ICD-10-CM | POA: Insufficient documentation

## 2019-01-18 DIAGNOSIS — C61 Malignant neoplasm of prostate: Secondary | ICD-10-CM | POA: Diagnosis not present

## 2019-01-18 DIAGNOSIS — N3289 Other specified disorders of bladder: Secondary | ICD-10-CM | POA: Insufficient documentation

## 2019-01-18 DIAGNOSIS — Z87891 Personal history of nicotine dependence: Secondary | ICD-10-CM | POA: Insufficient documentation

## 2019-01-18 DIAGNOSIS — I10 Essential (primary) hypertension: Secondary | ICD-10-CM | POA: Insufficient documentation

## 2019-01-18 DIAGNOSIS — R350 Frequency of micturition: Secondary | ICD-10-CM

## 2019-01-18 DIAGNOSIS — Z87440 Personal history of urinary (tract) infections: Secondary | ICD-10-CM | POA: Diagnosis not present

## 2019-01-18 DIAGNOSIS — Z79899 Other long term (current) drug therapy: Secondary | ICD-10-CM | POA: Diagnosis not present

## 2019-01-18 DIAGNOSIS — G4730 Sleep apnea, unspecified: Secondary | ICD-10-CM | POA: Insufficient documentation

## 2019-01-18 DIAGNOSIS — N138 Other obstructive and reflux uropathy: Secondary | ICD-10-CM | POA: Diagnosis not present

## 2019-01-18 HISTORY — PX: TRANSURETHRAL RESECTION OF PROSTATE: SHX73

## 2019-01-18 SURGERY — TURP (TRANSURETHRAL RESECTION OF PROSTATE)
Anesthesia: General

## 2019-01-18 MED ORDER — METOPROLOL SUCCINATE ER 25 MG PO TB24
25.0000 mg | ORAL_TABLET | Freq: Every day | ORAL | Status: DC
  Administered 2019-01-19: 25 mg via ORAL
  Filled 2019-01-18 (×2): qty 1

## 2019-01-18 MED ORDER — OXYBUTYNIN CHLORIDE 5 MG PO TABS
5.0000 mg | ORAL_TABLET | Freq: Three times a day (TID) | ORAL | Status: DC | PRN
  Administered 2019-01-18 – 2019-01-19 (×2): 5 mg via ORAL
  Filled 2019-01-18 (×4): qty 1

## 2019-01-18 MED ORDER — DIPHENHYDRAMINE HCL 50 MG/ML IJ SOLN: 12.5000 mg | Freq: Four times a day (QID) | INTRAMUSCULAR | Status: DC | PRN

## 2019-01-18 MED ORDER — TAMSULOSIN HCL 0.4 MG PO CAPS
0.4000 mg | ORAL_CAPSULE | Freq: Two times a day (BID) | ORAL | Status: DC
  Administered 2019-01-18 – 2019-01-19 (×2): 0.4 mg via ORAL
  Filled 2019-01-18 (×2): qty 1

## 2019-01-18 MED ORDER — SODIUM CHLORIDE 0.9 % IR SOLN
3000.0000 mL | Status: DC
  Administered 2019-01-18 – 2019-01-19 (×5): 3000 mL

## 2019-01-18 MED ORDER — FAMOTIDINE 20 MG PO TABS
ORAL_TABLET | ORAL | Status: AC
  Administered 2019-01-18: 20 mg via ORAL
  Filled 2019-01-18: qty 1

## 2019-01-18 MED ORDER — ONDANSETRON HCL 4 MG/2ML IJ SOLN: 4.0000 mg | Freq: Once | INTRAMUSCULAR | Status: DC | PRN

## 2019-01-18 MED ORDER — FENTANYL CITRATE (PF) 100 MCG/2ML IJ SOLN
INTRAMUSCULAR | Status: DC | PRN
  Administered 2019-01-18 (×4): 25 ug via INTRAVENOUS

## 2019-01-18 MED ORDER — HYDROCODONE-ACETAMINOPHEN 5-325 MG PO TABS
1.0000 | ORAL_TABLET | ORAL | Status: DC | PRN
  Filled 2019-01-18: qty 1

## 2019-01-18 MED ORDER — LACTATED RINGERS IV SOLN
INTRAVENOUS | Status: DC | PRN
  Administered 2019-01-18: 13:00:00 via INTRAVENOUS

## 2019-01-18 MED ORDER — GABAPENTIN 300 MG PO CAPS
900.0000 mg | ORAL_CAPSULE | Freq: Two times a day (BID) | ORAL | Status: DC
  Administered 2019-01-18 – 2019-01-19 (×2): 900 mg via ORAL
  Filled 2019-01-18 (×3): qty 3

## 2019-01-18 MED ORDER — PROPOFOL 10 MG/ML IV BOLUS
INTRAVENOUS | Status: DC | PRN
  Administered 2019-01-18: 120 mg via INTRAVENOUS

## 2019-01-18 MED ORDER — MIDAZOLAM HCL 2 MG/2ML IJ SOLN
INTRAMUSCULAR | Status: AC
  Filled 2019-01-18: qty 2

## 2019-01-18 MED ORDER — ACETAMINOPHEN 325 MG PO TABS: 650.0000 mg | ORAL_TABLET | ORAL | Status: DC | PRN

## 2019-01-18 MED ORDER — DIPHENHYDRAMINE HCL 12.5 MG/5ML PO ELIX
12.5000 mg | ORAL_SOLUTION | Freq: Four times a day (QID) | ORAL | Status: DC | PRN
  Filled 2019-01-18: qty 5

## 2019-01-18 MED ORDER — ONDANSETRON HCL 4 MG/2ML IJ SOLN
INTRAMUSCULAR | Status: DC | PRN
  Administered 2019-01-18: 4 mg via INTRAVENOUS

## 2019-01-18 MED ORDER — DOCUSATE SODIUM 100 MG PO CAPS: 100.0000 mg | ORAL_CAPSULE | Freq: Every day | ORAL | Status: DC | PRN

## 2019-01-18 MED ORDER — LIDOCAINE HCL (CARDIAC) PF 100 MG/5ML IV SOSY
PREFILLED_SYRINGE | INTRAVENOUS | Status: DC | PRN
  Administered 2019-01-18: 100 mg via INTRAVENOUS

## 2019-01-18 MED ORDER — DEXAMETHASONE SODIUM PHOSPHATE 10 MG/ML IJ SOLN
INTRAMUSCULAR | Status: DC | PRN
  Administered 2019-01-18: 4 mg via INTRAVENOUS

## 2019-01-18 MED ORDER — ASPIRIN EC 81 MG PO TBEC
81.0000 mg | DELAYED_RELEASE_TABLET | Freq: Every day | ORAL | Status: DC
  Filled 2019-01-18 (×2): qty 1

## 2019-01-18 MED ORDER — LIDOCAINE HCL (PF) 2 % IJ SOLN
INTRAMUSCULAR | Status: AC
  Filled 2019-01-18: qty 10

## 2019-01-18 MED ORDER — GLYCOPYRROLATE 0.2 MG/ML IJ SOLN
INTRAMUSCULAR | Status: DC | PRN
  Administered 2019-01-18: 0.2 mg via INTRAVENOUS

## 2019-01-18 MED ORDER — HEPARIN SODIUM (PORCINE) 5000 UNIT/ML IJ SOLN
5000.0000 [IU] | Freq: Three times a day (TID) | INTRAMUSCULAR | Status: DC
  Administered 2019-01-18 – 2019-01-19 (×2): 5000 [IU] via SUBCUTANEOUS
  Filled 2019-01-18 (×2): qty 1

## 2019-01-18 MED ORDER — DOCUSATE SODIUM 100 MG PO CAPS
100.0000 mg | ORAL_CAPSULE | Freq: Two times a day (BID) | ORAL | Status: DC
  Administered 2019-01-18 – 2019-01-19 (×2): 100 mg via ORAL
  Filled 2019-01-18 (×2): qty 1

## 2019-01-18 MED ORDER — ALPRAZOLAM 0.25 MG PO TABS: 0.2500 mg | ORAL_TABLET | Freq: Every day | ORAL | Status: DC | PRN

## 2019-01-18 MED ORDER — ONDANSETRON HCL 4 MG/2ML IJ SOLN
INTRAMUSCULAR | Status: AC
  Filled 2019-01-18: qty 2

## 2019-01-18 MED ORDER — SUCCINYLCHOLINE CHLORIDE 20 MG/ML IJ SOLN
INTRAMUSCULAR | Status: AC
  Filled 2019-01-18: qty 1

## 2019-01-18 MED ORDER — FAMOTIDINE 20 MG PO TABS
20.0000 mg | ORAL_TABLET | Freq: Once | ORAL | Status: AC
  Administered 2019-01-18: 20 mg via ORAL

## 2019-01-18 MED ORDER — MORPHINE SULFATE (PF) 2 MG/ML IV SOLN: 2.0000 mg | INTRAVENOUS | Status: DC | PRN

## 2019-01-18 MED ORDER — SODIUM CHLORIDE 0.9 % IV SOLN
INTRAVENOUS | Status: DC
  Administered 2019-01-18 – 2019-01-19 (×2): via INTRAVENOUS

## 2019-01-18 MED ORDER — PROPOFOL 10 MG/ML IV BOLUS
INTRAVENOUS | Status: AC
  Filled 2019-01-18: qty 20

## 2019-01-18 MED ORDER — BELLADONNA ALKALOIDS-OPIUM 16.2-60 MG RE SUPP: 1.0000 | Freq: Four times a day (QID) | RECTAL | Status: DC | PRN

## 2019-01-18 MED ORDER — OXYBUTYNIN CHLORIDE ER 5 MG PO TB24
10.0000 mg | ORAL_TABLET | Freq: Every day | ORAL | Status: DC
  Administered 2019-01-18 – 2019-01-19 (×2): 10 mg via ORAL
  Filled 2019-01-18: qty 2

## 2019-01-18 MED ORDER — DEXAMETHASONE SODIUM PHOSPHATE 10 MG/ML IJ SOLN
INTRAMUSCULAR | Status: AC
  Filled 2019-01-18: qty 1

## 2019-01-18 MED ORDER — FENTANYL CITRATE (PF) 100 MCG/2ML IJ SOLN
INTRAMUSCULAR | Status: AC
  Filled 2019-01-18: qty 2

## 2019-01-18 MED ORDER — FENTANYL CITRATE (PF) 100 MCG/2ML IJ SOLN: 25.0000 ug | INTRAMUSCULAR | Status: DC | PRN

## 2019-01-18 MED ORDER — OXYBUTYNIN CHLORIDE 5 MG PO TABS
ORAL_TABLET | ORAL | Status: AC
  Filled 2019-01-18: qty 1

## 2019-01-18 MED ORDER — ONDANSETRON HCL 4 MG/2ML IJ SOLN: 4.0000 mg | INTRAMUSCULAR | Status: DC | PRN

## 2019-01-18 MED ORDER — SODIUM CHLORIDE 0.9 % IV SOLN: INTRAVENOUS | Status: DC

## 2019-01-18 SURGICAL SUPPLY — 22 items
ADAPTER IRRIG TUBE 2 SPIKE SOL (ADAPTER) ×6 IMPLANT
BAG DRAIN CYSTO-URO LG1000N (MISCELLANEOUS) ×3 IMPLANT
BAG URO DRAIN 4000ML (MISCELLANEOUS) ×3 IMPLANT
CATH FOL 2WAY LX 24X30 (CATHETERS) IMPLANT
CATH FOL 3WAY LX 20X30 (CATHETERS) ×3 IMPLANT
DRAPE UTILITY 15X26 TOWEL STRL (DRAPES) ×3 IMPLANT
ELECT LOOP 22F BIPOLAR SML (ELECTROSURGICAL)
ELECTRODE LOOP 22F BIPOLAR SML (ELECTROSURGICAL) IMPLANT
GLOVE BIO SURGEON STRL SZ 6.5 (GLOVE) ×2 IMPLANT
GLOVE BIO SURGEONS STRL SZ 6.5 (GLOVE) ×1
GOWN STRL REUS W/ TWL LRG LVL3 (GOWN DISPOSABLE) ×2 IMPLANT
GOWN STRL REUS W/TWL LRG LVL3 (GOWN DISPOSABLE) ×4
HOLDER FOLEY CATH W/STRAP (MISCELLANEOUS) ×3 IMPLANT
KIT TURNOVER CYSTO (KITS) ×3 IMPLANT
LOOP CUT BIPOLAR 24F LRG (ELECTROSURGICAL) ×3 IMPLANT
PACK CYSTO AR (MISCELLANEOUS) ×3 IMPLANT
SET IRRIG Y TYPE TUR BLADDER L (SET/KITS/TRAYS/PACK) ×3 IMPLANT
SOL .9 NS 3000ML IRR  AL (IV SOLUTION) ×12
SOL .9 NS 3000ML IRR UROMATIC (IV SOLUTION) ×6 IMPLANT
SURGILUBE 2OZ TUBE FLIPTOP (MISCELLANEOUS) ×3 IMPLANT
SYRINGE IRR TOOMEY STRL 70CC (SYRINGE) ×3 IMPLANT
WATER STERILE IRR 1000ML POUR (IV SOLUTION) ×3 IMPLANT

## 2019-01-18 NOTE — Anesthesia Procedure Notes (Signed)
Procedure Name: LMA Insertion Date/Time: 01/18/2019 1:27 PM Performed by: Geraldine Contras, CRNA Pre-anesthesia Checklist: Patient identified, Emergency Drugs available, Suction available, Timeout performed and Patient being monitored Patient Re-evaluated:Patient Re-evaluated prior to induction Oxygen Delivery Method: Circle system utilized Preoxygenation: Pre-oxygenation with 100% oxygen Induction Type: IV induction LMA: LMA inserted LMA Size: 4.5 Number of attempts: 1 Placement Confirmation: positive ETCO2 and breath sounds checked- equal and bilateral Dental Injury: Teeth and Oropharynx as per pre-operative assessment

## 2019-01-18 NOTE — H&P (Signed)
10/13/2018 --> updated 01/18/2019 Original surgery was scheduled but delayed secondary to COVID-19 pandemic rescheduled for today Since last visit, there is no changes as per below RRR CTAB  CC:     Chief Complaint  Patient presents with  . Cysto    HPI: Earl Ramirez is a 78 yo M with a personal history of high risk prostate cancer, BPH with bladder outlet obstruction, bladder stones, and rUTIs returns today for a 1 month f/u for a cystoscopy for the evaluation and management of rUTIs.   He was initially diagnosed with high risk prostate cancer, Gleason +4 by Dr. Jacqlyn Larsen in 2016. Staging was negative for metastatic disease. The time, his PSA was 19. He underwent EBRT with 1 year of ADT. Over the past several years, his PSA has risen slowly but steadily.His PSA was 2.7 as of 10/06/18. The was in the setting of infection.   Since 6 months before his previous visit, he has been treated with 3 urinary tract infections. Each time he has urinary frequency, urgency, and burning along with dark-colored urine. He last rubbed off a urine 2 days ago on 07/22/2018 which was frankly positive. Its currently growing 100,000 colonies of gram-negative rods. He was started on Cipro for presumed urinary tract infection but he has not actually started the medication. He reports that when he got to the pharmacy, there were 2 different antibiotics both Bactrim and Cipro there. He was not sure which one he wanted to be filled so he filled the Cipro.   His most recent urine culture from 09/14/2018 was positive for Klebsiella pneumoniae.  He is currently on Bactrim DS twice daily.  His most recent PSA is 2.7 from 10/06/2018 and is concerning for reoccurrence of cancer. Does not meet criteria for biochemical reoccurrence.   RUS from 10/09/2018 showed a 2.2 cm upper renal pole mass likely complicated cyst.      Vitals:   10/13/18 1601  BP: 133/64  Pulse: 81   NED. A&Ox3.   No respiratory distress    Abd soft, NT, ND Normal phallus with bilateral descended testicles  Cystoscopy Procedure Note  Patient identification was confirmed, informed consent was obtained, and patient was prepped using Betadine solution.  Lidocaine jelly was administered per urethral meatus.    Pre-Procedure: - Inspection reveals a normal caliber ureteral meatus.  Procedure: The flexible cystoscope was introduced without difficulty - No urethral strictures/lesions are present. -Irregular friable prostate with prostatic regrowth, friable mucosa, bleeding with manipulation and some adherent necrotic material bilaterally near the bladder neck -Slightly elevated bladder neck - Bilateral ureteral orifices identified - Bladder mucosa  reveals no ulcers, tumors, or lesions - No bladder stones - Trabeculated bladder slightly erythematous concurrent with reccurent cystitis  - Friable with bleeding manipulation  - TURP defect on retroflexion with some additional necrotic material.  Few small adherent calcifications, 2 to 3 mm stones  Post-Procedure: - Patient tolerated the procedure well  Assessment/ Plan:  1. rUTIs /abnormal prostate Multiple recurrent urinary tract infections over the past several months, currently on antibiotics Cystoscopic findings today with evidence of necrosis, prostatic regrowth, some small calcifications near the bladder neck as well as necrotic prostate material We discussed various options but at this point, would recommend consideration of return to the OR for a channel TURP to resect prostatic regrowth, necrotic material as well as small calcifications which may be contributing to recurrent infections/presence of biofilm which will also aid with tissue diagnosis if there is concern for  possible prostate cancer recurrence Discussed risk of surgery including risk of bleeding, infection, damage surrounding structures, ejaculatory dysfunction, need for further procedures, need for  overnight admission with CBI amongst others.  All questions were answered.  He is agreeable this plan.  Plan to repeat urine culture prior to surgery, may need suppressive antibiotics prior to the procedure to reduce the risk of infectious complications.  2. Elevated PSA  Most recent PSA was 2.7 Will monitor closely, f/u in 3 months with PSA   3. Renal mass RUS from 3/6 showed a 2.2 cm upper renal pole mass likely complicated cyst so recommended getting a MRI   I, Nethusan Sivanesan, am acting as a scribe for Dr. [name],  I have reviewed the above documentation for accuracy and completeness, and I agree with the above.   [name], MD

## 2019-01-18 NOTE — Anesthesia Preprocedure Evaluation (Signed)
Anesthesia Evaluation  Patient identified by MRN, date of birth, ID band Patient awake    Reviewed: Allergy & Precautions, NPO status , Patient's Chart, lab work & pertinent test results, reviewed documented beta blocker date and time   Airway Mallampati: III  TM Distance: >3 FB     Dental  (+) Chipped   Pulmonary sleep apnea , COPD, former smoker,           Cardiovascular hypertension, Pt. on medications + dysrhythmias Atrial Fibrillation      Neuro/Psych    GI/Hepatic   Endo/Other    Renal/GU      Musculoskeletal  (+) Arthritis ,   Abdominal   Peds  Hematology   Anesthesia Other Findings Obese. AV replaced. EKG ok. Platelets 41000.  Reproductive/Obstetrics                             Anesthesia Physical Anesthesia Plan  ASA: III  Anesthesia Plan: General   Post-op Pain Management:    Induction: Intravenous  PONV Risk Score and Plan:   Airway Management Planned: Oral ETT and LMA  Additional Equipment:   Intra-op Plan:   Post-operative Plan:   Informed Consent: I have reviewed the patients History and Physical, chart, labs and discussed the procedure including the risks, benefits and alternatives for the proposed anesthesia with the patient or authorized representative who has indicated his/her understanding and acceptance.       Plan Discussed with: CRNA  Anesthesia Plan Comments:         Anesthesia Quick Evaluation

## 2019-01-18 NOTE — Transfer of Care (Signed)
Immediate Anesthesia Transfer of Care Note  Patient: Earl Ramirez  Procedure(s) Performed: CHANNEL TRANSURETHRAL RESECTION OF THE PROSTATE (TURP) (N/A )  Patient Location: PACU  Anesthesia Type:General  Level of Consciousness: awake  Airway & Oxygen Therapy: Patient connected to face mask oxygen  Post-op Assessment: Report given to RN  Post vital signs: stable  Last Vitals:  Vitals Value Taken Time  BP 100/68 01/18/19 1432  Temp    Pulse 89 01/18/19 1434  Resp 14 01/18/19 1434  SpO2 99 % 01/18/19 1434  Vitals shown include unvalidated device data.  Last Pain:  Vitals:   01/18/19 1254  PainSc: 0-No pain         Complications: No apparent anesthesia complications

## 2019-01-18 NOTE — Op Note (Signed)
Date of procedure: 01/18/19  Preoperative diagnosis:  1. Recurrent UTIs 2. Prostate cancer  3. BPH with lower urinary tract symptoms  Postoperative diagnosis:  1. Same as above   Procedure: 1. Channel TURP 2. Urethral dilation  Surgeon: [name], MD  Anesthesia: General  Complications: None  Intraoperative findings: Tight prostatic fossa with nodular regrowth of lateral prostate tissue.  Small adherent necrotic tissue at left bladder neck with attached stones..  EBL: Minimal  Specimens: Prostate chips  Drains: 20 French three-way Foley catheter with 30 cc balloon  Indication: Earl Ramirez is a 78 y.o. patient with history of prostate cancer, recurrent urinary tract infections and obstructive urinary symptoms.  After reviewing the management options for treatment, he elected to proceed with the above surgical procedure(s). We have discussed the potential benefits and risks of the procedure, side effects of the proposed treatment, the likelihood of the patient achieving the goals of the procedure, and any potential problems that might occur during the procedure or recuperation. Informed consent has been obtained.  Description of procedure:  The patient was taken to the operating room and general anesthesia was induced.  The patient was placed in the dorsal lithotomy position, prepped and draped in the usual sterile fashion, and preoperative antibiotics were administered. A preoperative time-out was performed.   A 21 French scope was advanced per urethra into the bladder.  Notably, he had a narrow fossa navicularis barely accommodating the peak of the 21 [place] scope.  The prostatic fossa was irregular with nodular regrowth and coapting lateral lobes.  The bladder neck itself was relatively smooth with notable TUR defect noted.  There is some adherent necrotic tissue at the left bladder neck within the prostatic fossa with some adjacent attached stones.  Bladder itself was  mildly trabeculated otherwise unremarkable.  The trigone was intact and a good distance from the bladder neck.  At this point time, the urethra was calibrated using male sounds starting with 20 [place] up to 30 [place] short accommodate the 26 [place] resectoscope.  This was then passed with a visual obturator as was not able to be advanced to the prostatic fossa using the blunt obturator given the irregular shape.  A large bipolar loop was then used using saline as the medium to take down the irregular lateral lobe prostate tissue and more widely open the prostatic fossa.  Care second to avoid any resection beyond the verumontanum.  A small amount of tissue at the left bladder neck was taken the prostatic fossa where the mildly necrotic tissue was noted along with the stones which were freed.  Finally, the prostate tissue irrigated from the bladder.  Careful and very thorough hemostasis was achieved given the patient's thrombocytopenia.  Once this procedure was complete, the scope was removed and a 20 [place] three-way Foley catheter was placed.  The balloon was filled with 30 cc of sterile water.  Slow drip CBI was initiated.  The patient was then clean and dry, repositioned in supine position, version seizure, taken the PACU in stable condition.  Plan: Patient will be admitted overnight for CBI.  [name], M.D.

## 2019-01-18 NOTE — Anesthesia Post-op Follow-up Note (Signed)
Anesthesia QCDR form completed.        

## 2019-01-19 ENCOUNTER — Telehealth: Payer: Self-pay | Admitting: Urology

## 2019-01-19 ENCOUNTER — Encounter: Payer: Self-pay | Admitting: Urology

## 2019-01-19 DIAGNOSIS — N401 Enlarged prostate with lower urinary tract symptoms: Secondary | ICD-10-CM | POA: Diagnosis not present

## 2019-01-19 LAB — BASIC METABOLIC PANEL
Anion gap: 8 (ref 5–15)
BUN: 16 mg/dL (ref 8–23)
CO2: 25 mmol/L (ref 22–32)
Calcium: 8.2 mg/dL — ABNORMAL LOW (ref 8.9–10.3)
Chloride: 109 mmol/L (ref 98–111)
Creatinine, Ser: 0.66 mg/dL (ref 0.61–1.24)
GFR calc Af Amer: >60 mL/min (ref >60)
GFR calc non Af Amer: >60 mL/min (ref >60)
Glucose, Bld: 111 mg/dL — ABNORMAL HIGH (ref 70–99)
Potassium: 4 mmol/L (ref 3.5–5.1)
Sodium: 142 mmol/L (ref 135–145)

## 2019-01-19 LAB — CBC
HCT: 38.3 % — ABNORMAL LOW (ref 39.0–52.0)
Hemoglobin: 12.4 g/dL — ABNORMAL LOW (ref 13.0–17.0)
MCH: 32.5 pg (ref 26.0–34.0)
MCHC: 32.4 g/dL (ref 30.0–36.0)
MCV: 100.5 fL — ABNORMAL HIGH (ref 80.0–100.0)
Platelets: 40 10*3/uL — ABNORMAL LOW (ref 150–400)
RBC: 3.81 MIL/uL — ABNORMAL LOW (ref 4.22–5.81)
RDW: 13.2 % (ref 11.5–15.5)
WBC: 2.2 10*3/uL — ABNORMAL LOW (ref 4.0–10.5)
nRBC: 0 % (ref 0.0–0.2)

## 2019-01-19 MED ORDER — DOCUSATE SODIUM 100 MG PO CAPS: 100.0000 mg | ORAL_CAPSULE | Freq: Two times a day (BID) | ORAL | 0 refills | Status: DC

## 2019-01-19 MED ORDER — HYDROCODONE-ACETAMINOPHEN 5-325 MG PO TABS: 1.0000 | ORAL_TABLET | Freq: Four times a day (QID) | ORAL | 0 refills | Status: DC | PRN

## 2019-01-19 MED ORDER — TRIMETHOPRIM 100 MG PO TABS: 100.0000 mg | ORAL_TABLET | Freq: Every day | ORAL | 0 refills | Status: DC

## 2019-01-19 NOTE — Discharge Instructions (Signed)
Transurethral Resection of the Prostate, Care After °Refer to this sheet in the next few weeks. These instructions provide you with information about caring for yourself after your procedure. Your health care provider may also give you more specific instructions. Your treatment has been planned according to current medical practices, but problems sometimes occur. Call your health care provider if you have any problems or questions after your procedure. °What can I expect after the procedure? °After the procedure, it is common to have: °· Mild pain in your lower abdomen. °· Soreness or mild discomfort in your penis from having the catheter inserted during the procedure. °· A feeling of urgency when you need to urinate. °· A small amount of blood in your urine. You may notice some small blood clots in your urine. These are normal. °Follow these instructions at home: °Medicines ° °· Take over-the-counter and prescription medicines only as told by your health care provider. °· Do not drive or operate heavy machinery while taking prescription pain medicine. °· Do not drive for 24 hours if you received a sedative. °· If you were prescribed antibiotic medicine, take it as told by your health care provider. Do not stop taking the antibiotic even if you start to feel better. °Activity °· Return to your normal activities as told by your health care provider. Ask your health care provider what activities are safe for you. °· Do not lift anything that is heavier than 10 lb (4.5 kg) for 3 weeks after your procedure, or as long as told by your health care provider. °· Avoid intense physical activity for as long as told by your health care provider. °· Avoid sitting for a long time without moving. Get up and move around one or more times every few hours. This helps to prevent blood clots. You may increase your physical activity gradually as you start to feel better. °Lifestyle °· Do not drink alcohol for as long as told by your  health care provider. This is especially important if you are taking prescription pain medicines. °· Do not engage in sexual activity until your health care provider says that you can do this. °General instructions °· Do not take baths, swim, or use a hot tub until your health care provider approves. °· Drink enough fluid to keep your urine clear or pale yellow. °· Urinate as soon as you feel the need to. Do not try to hold your urine for long periods of time. °· If your health care provider approves, you may take a stool softener for 2-3 weeks to prevent you from straining to have a bowel movement. °· Wear compression stockings as told by your health care provider. These stockings help to prevent blood clots and reduce swelling in your legs. °· Keep all follow-up visits as told by your health care provider. This is important. °Contact a health care provider if: °· You have difficulty urinating. °· You have a fever. °· You have pain that gets worse or does not improve with medicine. °· You have blood in your urine that does not go away after 1 week of resting and drinking more fluids. °· You have swelling in your penis or testicles. °Get help right away if: °· You are unable to urinate. °· You are having more blood clots in your urine instead of fewer. °· You have: °? Large blood clots. °? A lot of blood in your urine. °? Pain in your back or lower abdomen. °? Pain or swelling in your legs. °?   Chills and you are shaking. °This information is not intended to replace advice given to you by your health care provider. Make sure you discuss any questions you have with your health care provider. °Document Released: 07/22/2005 Document Revised: 09/04/2016 Document Reviewed: 04/13/2015 °Elsevier Interactive Patient Education © 2019 [employer] ° °

## 2019-01-19 NOTE — Telephone Encounter (Signed)
Pt called from his hospital room and states that he is having bloody urine, he also states that he has told his nurse.He would like a call back from our clinical staff to tell him if this is normal. He is in rm [phone]. Please advise.

## 2019-01-19 NOTE — Telephone Encounter (Signed)
Patient was told that this was normal post op and to follow up with the nurse on the floor

## 2019-01-19 NOTE — Care Management Obs Status (Signed)
[facility] NOTIFICATION   Patient Details  Name: Earl Ramirez MRN: [id number] Date of Birth: 07-21-1941   Medicare Observation Status Notification Given:  Yes    Tommy Medal 01/19/2019, 3:45 PM

## 2019-01-19 NOTE — Progress Notes (Signed)
Pt discharged per MD order. IV removed. Discharged instructions reviewed with pt. Pt verbalized understanding.Pt taken downstairs in wheelchair by staff.

## 2019-01-19 NOTE — Anesthesia Postprocedure Evaluation (Signed)
Anesthesia Post Note  Patient: Earl Ramirez  Procedure(s) Performed: CHANNEL TRANSURETHRAL RESECTION OF THE PROSTATE (TURP) (N/A )  Patient location during evaluation: PACU Anesthesia Type: General Level of consciousness: awake and alert Pain management: pain level controlled Vital Signs Assessment: post-procedure vital signs reviewed and stable Respiratory status: spontaneous breathing, nonlabored ventilation, respiratory function stable and patient connected to nasal cannula oxygen Cardiovascular status: blood pressure returned to baseline and stable Postop Assessment: no apparent nausea or vomiting Anesthetic complications: no     Last Vitals:  Vitals:   01/19/19 0032 01/19/19 0459  BP: (!) 128/55 (!) 133/54  Pulse: 67 69  Resp: 20 16  Temp: (!) 36.3 C (!) 36.4 C  SpO2: 95% 97%    Last Pain:  Vitals:   01/19/19 0459  TempSrc: Oral  PainSc:                  Will Heinkel S

## 2019-01-19 NOTE — Discharge Summary (Signed)
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 Prostate cancer ([place]) 01/18/2019  . 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 Briefly, Earl Ramirez is a 78 y.o. year old patient with personal history of high risk prostate cancer with obstructing regrowth of tumor/recurrent UTIs.  He is elected to undergo channel TURP for outlet relief.   Hospital Course: Patient tolerated the procedure well.  He was then transferred to the floor after an uneventful PACU stay.  His hospital course was uncomplicated.  Been on CBI overnight which was stopped for several hours on postop day 1.  His urine remained light red in color without clots.  He ultimately underwent voiding trial with suture successful with a minimal PVR.  On POD#1 he had met discharge criteria: was eating a regular diet, was up and ambulating independently,  pain was well controlled, was voiding without a catheter, and was ready to for discharge.  Physical Exam Vitals signs reviewed.  Constitutional:      General: He is not in acute distress.    Appearance: Normal appearance.  HENT:     Head: Normocephalic and atraumatic.  Neck:     Musculoskeletal: Normal range of motion.  Cardiovascular:     Rate and Rhythm: Normal rate.  Pulmonary:     Effort: Pulmonary effort is normal. No respiratory distress.  Abdominal:     General: Abdomen is flat. There is no distension.     Tenderness: There is no abdominal tenderness.  Genitourinary:    Comments: Foley present draining light red urine, no clots with CBI off Skin:    General: Skin is warm and dry.     Capillary Refill: Capillary refill takes less than 2 seconds.  Neurological:   General: No focal deficit present.     Mental Status: He is alert and oriented to person, place, and time.  Psychiatric:        Mood and Affect: Mood normal.     Comments: Slightly tangential but easily reoriented       Laboratory values:  Recent Labs    01/19/19 0508  WBC 2.2*  HGB 12.4*  HCT 38.3*   Recent Labs    01/19/19 0508  NA 142  K 4.0  CL 109  CO2 25  GLUCOSE 111*  BUN 16  CREATININE 0.66  CALCIUM 8.2*   No results for input(s): LABPT, INR in the last 72 hours. No results for input(s): LABURIN in the last 72 hours. Results for orders placed or performed during the hospital encounter of 01/14/19  Urine culture     Status: None   Collection Time: 01/14/19  3:14 PM   Specimen: Urine, Random  Result Value Ref Range Status   Specimen Description   Final    URINE, RANDOM Performed at [facility], [address], [place], [place] [address]    Special Requests   Final    NONE Performed at [facility], [address], [place], [place] [address]    Culture   Final    NO GROWTH Performed at [facility], [place] [address], [place], [place] [address]    Report Status 01/15/2019 FINAL  Final  Novel Coronavirus, NAA (hospital order; send-out to ref lab)  Status: None   Collection Time: 01/14/19  3:35 PM   Specimen: Nasopharyngeal Swab; Respiratory  Result Value Ref Range Status   SARS-CoV-2, NAA NOT DETECTED NOT DETECTED Final    Comment: (NOTE) This test was developed and its performance characteristics determined by [employer]. This test has not been FDA cleared or approved. This test has been authorized by FDA under an Emergency Use Authorization (EUA). This test is only authorized for the duration of time the declaration that circumstances exist justifying the authorization of the emergency use of in vitro diagnostic tests for detection of SARS-CoV-2 virus and/or diagnosis of COVID-19 infection under section  564(b)(1) of the Act, 21 U.S.C. 295AOZ-3(Y)(8), unless the authorization is terminated or revoked sooner. When diagnostic testing is negative, the possibility of a false negative result should be considered in the context of a patient's recent exposures and the presence of clinical signs and symptoms consistent with COVID-19. An individual without symptoms of COVID-19 and who is not shedding SARS-CoV-2 virus would expect to have a negative (not detected) result in this assay. Performed  At: [facility] [place], [place] [id number] Rush Farmer MD [id number]    [place]  Final    Comment: Performed at [facility], [address] [place], [place] [address]    Disposition: Home  Discharge instruction: post op TURP instructions  Discharge medications:  Allergies as of 01/19/2019      Reactions   Oxycodone-acetaminophen Nausea Only, Shortness Of Breath   Amoxicillin Itching   Hand itching   Penicillins Rash   Did it involve swelling of the face/tongue/throat, SOB, or low BP? No Did it involve sudden or severe rash/hives, skin peeling, or any reaction on the inside of your mouth or nose? No Did you need to seek medical attention at a hospital or doctor's office? No When did it last happen?4-5 years ago If all above answers are "NO", may proceed with cephalosporin use.      Medication List    STOP taking these medications   sulfamethoxazole-trimethoprim 800-160 MG tablet Commonly known as: BACTRIM DS     TAKE these medications   ALPRAZolam 0.25 MG tablet Commonly known as: XANAX Take 0.25 mg by mouth daily as needed for anxiety.   aspirin EC 81 MG tablet Take 81 mg by mouth daily.   docusate sodium 100 MG capsule Commonly known as: COLACE Take 100 mg by mouth daily as needed for moderate constipation. What changed: Another medication with the same name was added. Make sure you understand how and when  to take each.   docusate sodium 100 MG capsule Commonly known as: COLACE Take 1 capsule (100 mg total) by mouth 2 (two) times daily. What changed: You were already taking a medication with the same name, and this prescription was added. Make sure you understand how and when to take each.   gabapentin 300 MG capsule Commonly known as: NEURONTIN Take 900 mg by mouth 2 (two) times daily.   HYDROcodone-acetaminophen 5-325 MG tablet Commonly known as: NORCO/VICODIN Take 1-2 tablets by mouth every 6 (six) hours as needed for moderate pain.   metoprolol succinate 25 MG 24 hr tablet Commonly known as: TOPROL-XL Take 25 mg by mouth daily.   tamsulosin 0.4 MG Caps capsule Commonly known as: FLOMAX Take 1 capsule (0.4 mg total) by mouth 2 (two) times daily.   trimethoprim 100 MG tablet Commonly known as: TRIMPEX Take 1 tablet (100 mg total) by mouth daily.  Followup:  Follow-up Information    [name], MD In 6 weeks.   Specialty: Urology Contact information: [place] [address] [phone]

## 2019-01-21 LAB — SURGICAL PATHOLOGY

## 2019-01-22 ENCOUNTER — Telehealth: Payer: Self-pay

## 2019-01-22 NOTE — Telephone Encounter (Signed)
Called pt no answer. LM for pt informing him of the information below. Advised pt to call back for questions or concerns.  

## 2019-01-22 NOTE — Telephone Encounter (Signed)
-----   Message from [name], MD sent at 01/21/2019  4:04 PM EDT ----- Please let this patient know that the surgical pathology of his prostate chips was negative for any prostate cancer.  Great news.  [name], MD

## 2019-02-16 DIAGNOSIS — Z136 Encounter for screening for cardiovascular disorders: Secondary | ICD-10-CM | POA: Diagnosis not present

## 2019-02-16 DIAGNOSIS — D61818 Other pancytopenia: Secondary | ICD-10-CM | POA: Diagnosis not present

## 2019-02-23 DIAGNOSIS — D61818 Other pancytopenia: Secondary | ICD-10-CM | POA: Diagnosis not present

## 2019-02-23 DIAGNOSIS — Z0001 Encounter for general adult medical examination with abnormal findings: Secondary | ICD-10-CM | POA: Diagnosis not present

## 2019-02-23 DIAGNOSIS — Z0000 Encounter for general adult medical examination without abnormal findings: Secondary | ICD-10-CM | POA: Diagnosis not present

## 2019-02-23 DIAGNOSIS — G4730 Sleep apnea, unspecified: Secondary | ICD-10-CM | POA: Diagnosis not present

## 2019-02-23 DIAGNOSIS — I4892 Unspecified atrial flutter: Secondary | ICD-10-CM | POA: Diagnosis not present

## 2019-02-23 DIAGNOSIS — D696 Thrombocytopenia, unspecified: Secondary | ICD-10-CM | POA: Diagnosis not present

## 2019-02-23 DIAGNOSIS — J42 Unspecified chronic bronchitis: Secondary | ICD-10-CM | POA: Diagnosis not present

## 2019-02-23 DIAGNOSIS — C61 Malignant neoplasm of prostate: Secondary | ICD-10-CM | POA: Diagnosis not present

## 2019-03-04 DIAGNOSIS — G4730 Sleep apnea, unspecified: Secondary | ICD-10-CM | POA: Diagnosis not present

## 2019-03-04 DIAGNOSIS — I359 Nonrheumatic aortic valve disorder, unspecified: Secondary | ICD-10-CM | POA: Diagnosis not present

## 2019-03-04 DIAGNOSIS — I4892 Unspecified atrial flutter: Secondary | ICD-10-CM | POA: Diagnosis not present

## 2019-03-09 ENCOUNTER — Other Ambulatory Visit: Payer: Self-pay

## 2019-03-09 ENCOUNTER — Encounter: Payer: Self-pay | Admitting: Urology

## 2019-03-09 ENCOUNTER — Ambulatory Visit (INDEPENDENT_AMBULATORY_CARE_PROVIDER_SITE_OTHER): Payer: PPO | Admitting: Urology

## 2019-03-09 VITALS — BP 138/74 | HR 76 | Ht 67.0 in | Wt 234.0 lb

## 2019-03-09 DIAGNOSIS — N401 Enlarged prostate with lower urinary tract symptoms: Secondary | ICD-10-CM | POA: Diagnosis not present

## 2019-03-09 DIAGNOSIS — N390 Urinary tract infection, site not specified: Secondary | ICD-10-CM

## 2019-03-09 DIAGNOSIS — C61 Malignant neoplasm of prostate: Secondary | ICD-10-CM

## 2019-03-09 LAB — BLADDER SCAN AMB NON-IMAGING

## 2019-03-09 NOTE — Progress Notes (Signed)
03/09/2019 6:06 PM   Earl Ramirez 18, 1942 [id number]  Referring provider: Baxter Hire, MD [place],  [place] [id number]  Chief Complaint  Patient presents with  . Routine Post Op    HPI: 78 year old male with a history of high risk prostate cancer, bladder outlet obstruction, history of bladder stones, recurrent UTIs he returns today following recent channel TURP on 01/18/2019.  Prostate chips negative for malignancy.  Foley catheter removed prior to discharge.  Returns today for routine follow-up.  He was initially diagnosed with high risk prostate cancer, Gleason +4 by Dr. Jacqlyn Larsen in 2016. Staging was negative for metastatic disease. The time, his PSA was 19. He underwent EBRT with 1 year of ADT. Over the past several years, his PSA has risen slowly but steadily.His PSA was 2.7as of 10/06/18. The was in the setting of infection.   Repeat PSA today is pending.  He reports that he is quite pleased with the results of his operation.  His urinary frequency and urgency has improved.  He feels like he is emptying his bladder better.  He denies any dysuria or gross hematuria.  He no longer has any burning and is not had an infection since his procedure.    IPSS    Row Name 03/09/19 1100         International Prostate Symptom Score   How often have you had the sensation of not emptying your bladder?  About half the time     How often have you had to urinate less than every two hours?  About half the time     How often have you found you stopped and started again several times when you urinated?  About half the time     How often have you found it difficult to postpone urination?  About half the time     How often have you had a weak urinary stream?  About half the time     How often have you had to strain to start urination?  Not at All     How many times did you typically get up at night to urinate?  1 Time     Total IPSS Score  16       Quality of Life  due to urinary symptoms   If you were to spend the rest of your life with your urinary condition just the way it is now how would you feel about that?  Mixed        Score:  1-7 Mild 8-19 Moderate 20-35 Severe   PMH: Past Medical History:  Diagnosis Date  . Aortic valve replaced    1998  . Arthritis   . Atrial fibrillation ([place])   . COPD (chronic obstructive pulmonary disease) ([place])   . Dysrhythmia    atrial fib  . Hernia of abdominal wall   . Hypertension   . Indigestion   . Neuropathy   . Neuropathy   . Prostate cancer ([place])   . Sleep apnea   . Thrombocytopenia ([place])   . Tremors of nervous system   . Urinary problem     Surgical History: Past Surgical History:  Procedure Laterality Date  . CALDWELL LUC    . tongue      growth removed  . TRANSURETHRAL RESECTION OF PROSTATE N/A 01/18/2019   Procedure: CHANNEL TRANSURETHRAL RESECTION OF THE PROSTATE (TURP);  Surgeon: [name], MD;  Location: [facility] ORS;  Service: Urology;  Laterality: N/A;  .  VALVE REPLACEMENT      Home Medications:  Allergies as of 03/09/2019      Reactions   Oxycodone-acetaminophen Nausea Only, Shortness Of Breath   Amoxicillin Itching   Hand itching   Penicillins Rash   Did it involve swelling of the face/tongue/throat, SOB, or low BP? No Did it involve sudden or severe rash/hives, skin peeling, or any reaction on the inside of your mouth or nose? No Did you need to seek medical attention at a hospital or doctor's office? No When did it last happen?4-5 years ago If all above answers are "NO", may proceed with cephalosporin use.      Medication List       Accurate as of March 09, 2019 11:59 PM. If you have any questions, ask your nurse or doctor.        STOP taking these medications   metoprolol succinate 25 MG 24 hr tablet Commonly known as: TOPROL-XL Stopped by: [name], MD   trimethoprim 100 MG tablet Commonly known as: TRIMPEX Stopped by: [name], MD      TAKE these medications   ALPRAZolam 0.25 MG tablet Commonly known as: XANAX Take 0.25 mg by mouth daily as needed for anxiety.   aspirin EC 81 MG tablet Take 81 mg by mouth daily.   docusate sodium 100 MG capsule Commonly known as: COLACE Take 100 mg by mouth daily as needed for moderate constipation.   docusate sodium 100 MG capsule Commonly known as: COLACE Take 1 capsule (100 mg total) by mouth 2 (two) times daily.   gabapentin 100 MG capsule Commonly known as: NEURONTIN Take 300 mg by mouth 3 (three) times daily. What changed: Another medication with the same name was removed. Continue taking this medication, and follow the directions you see here. Changed by: [name], MD   HYDROcodone-acetaminophen 5-325 MG tablet Commonly known as: NORCO/VICODIN Take 1-2 tablets by mouth every 6 (six) hours as needed for moderate pain.   tamsulosin 0.4 MG Caps capsule Commonly known as: FLOMAX Take 1 capsule (0.4 mg total) by mouth 2 (two) times daily.       Allergies:  Allergies  Allergen Reactions  . Oxycodone-Acetaminophen Nausea Only and Shortness Of Breath  . Amoxicillin Itching    Hand itching  . Penicillins Rash    Did it involve swelling of the face/tongue/throat, SOB, or low BP? No Did it involve sudden or severe rash/hives, skin peeling, or any reaction on the inside of your mouth or nose? No Did you need to seek medical attention at a hospital or doctor's office? No When did it last happen?4-5 years ago If all above answers are "NO", may proceed with cephalosporin use.     Family History: Family History  Problem Relation Age of Onset  . Heart attack Father   . Alcohol abuse Father   . Congestive Heart Failure Mother   . Alzheimer's disease Sister     Social History:  reports that he quit smoking about 18 years ago. His smoking use included cigarettes. He quit smokeless tobacco use about 18 years ago. He reports current alcohol use of about  10.0 standard drinks of alcohol per week. He reports that he does not use drugs.  ROS: UROLOGY Frequent Urination?: No Hard to postpone urination?: No Burning/pain with urination?: No Get up at night to urinate?: No Leakage of urine?: Yes Urine stream starts and stops?: No Trouble starting stream?: No Do you have to strain to urinate?: No Blood in urine?:  No Urinary tract infection?: No Sexually transmitted disease?: No Injury to kidneys or bladder?: No Painful intercourse?: No Weak stream?: No Erection problems?: No Penile pain?: No  Gastrointestinal Nausea?: No Vomiting?: No Indigestion/heartburn?: No Diarrhea?: No Constipation?: No  Constitutional Fever: No Night sweats?: No Weight loss?: No Fatigue?: No  Skin Skin rash/lesions?: No Itching?: No  Eyes Blurred vision?: No Double vision?: No  Ears/Nose/Throat Sore throat?: No Sinus problems?: No  Hematologic/Lymphatic Swollen glands?: No Easy bruising?: No  Cardiovascular Leg swelling?: Yes Chest pain?: No  Respiratory Cough?: No Shortness of breath?: No  Endocrine Excessive thirst?: No  Musculoskeletal Back pain?: No Joint pain?: Yes  Neurological Headaches?: No Dizziness?: No  Psychologic Depression?: No Anxiety?: No  Physical Exam: BP 138/74   Pulse 76   Ht 5\' 7"  (1.702 m)   Wt 234 lb (106.1 kg)   BMI 36.65 kg/m   Constitutional:  Alert and oriented, No acute distress. HEENT: [place] AT, moist mucus membranes.  Trachea midline, no masses. Cardiovascular: No clubbing, cyanosis, or edema. Respiratory: Normal respiratory effort, no increased work of breathing. GI: Abdomen is soft, nontender, obese Skin: No rashes, bruises or suspicious lesions. Neurologic: Grossly intact, no focal deficits, moving all 4 extremities. Psychiatric: Normal mood and affect.  Laboratory Data: Lab Results  Component Value Date   WBC 2.2 (L) 01/19/2019   HGB 12.4 (L) 01/19/2019   HCT 38.3 (L)  01/19/2019   MCV 100.5 (H) 01/19/2019   PLT 40 (L) 01/19/2019    Lab Results  Component Value Date   CREATININE 0.66 01/19/2019     Results for orders placed or performed in visit on 03/09/19  PSA  Result Value Ref Range   Prostate Specific Ag, Serum 3.6 0.0 - 4.0 ng/mL  Bladder Scan (Post Void Residual) in office  Result Value Ref Range   Scan Result 10ML    Assessment & Plan:    1. Benign prostatic hyperplasia with lower urinary tract symptoms, symptom details unspecified Symptoms improved status post channel TURP We will continue to follow clinically Excellent bladder emptying today Continue Flomax at this time but consider stopping in the future if the symptoms continue to improve - Bladder Scan (Post Void Residual) in office - PSA  2. Recurrent UTI No further episodes since channel TURP Advised to return if you have signs or symptoms of infection-he is very reliable with this No longer on suppression  3. Prostate cancer [facility]) Personal history of prostate cancer status post IMRT/Lupron PSA is returning but does not yet meet criteria for biochemical recurrence at this point time Plan for repeat PSA today, consider repeat staging if and when it reaches that point   Return in about 6 months (around 09/09/2019) for [facility].  [name], MD  [facility] [address] [place], [place] [address] [phone]

## 2019-03-10 ENCOUNTER — Telehealth: Payer: Self-pay | Admitting: Urology

## 2019-03-10 DIAGNOSIS — C61 Malignant neoplasm of prostate: Secondary | ICD-10-CM

## 2019-03-10 LAB — PSA: Prostate Specific Ag, Serum: 3.6 ng/mL (ref 0.0–4.0)

## 2019-03-10 NOTE — Telephone Encounter (Signed)
Please let Mr. Jha know that his PSA continues to rise.  It is now up to 3.6.  I am wondering if part of this is some postop variation.  Like you to come back in 4 weeks for repeat PSA.  If your PSA continues to remain elevated, we may need to repeat staging imaging.  [name], MD

## 2019-03-12 NOTE — Telephone Encounter (Signed)
Lm to cb to give results and make app   Sharyn Lull

## 2019-03-24 ENCOUNTER — Telehealth: Payer: Self-pay | Admitting: Oncology

## 2019-03-24 NOTE — Telephone Encounter (Signed)
Called patient to confirm repeat CT scan without contrast for tomorrow.  Patient lost his wife last month.  He would like to push out his scan for approximately 3 to 4 weeks.  I have canceled his appointment and will reschedule for mid to late September.  Faythe Casa, NP 03/24/2019 3:03 PM

## 2019-03-25 ENCOUNTER — Inpatient Hospital Stay: Payer: PPO | Admitting: Oncology

## 2019-03-25 ENCOUNTER — Ambulatory Visit: Payer: PPO

## 2019-04-02 ENCOUNTER — Telehealth: Payer: Self-pay | Admitting: *Deleted

## 2019-04-02 NOTE — Telephone Encounter (Signed)
Pt has been made aware of upcoming appts for follow up CT scan and follow up appt with Jennifer Burns, NP in the [facility]. Pt verbalized understanding. Nothing further needed at this time. 

## 2019-04-13 ENCOUNTER — Telehealth: Payer: Self-pay | Admitting: *Deleted

## 2019-04-13 ENCOUNTER — Other Ambulatory Visit: Payer: Self-pay | Admitting: Radiology

## 2019-04-13 ENCOUNTER — Other Ambulatory Visit: Payer: PPO

## 2019-04-13 ENCOUNTER — Other Ambulatory Visit: Payer: Self-pay

## 2019-04-13 DIAGNOSIS — N390 Urinary tract infection, site not specified: Secondary | ICD-10-CM

## 2019-04-13 DIAGNOSIS — C61 Malignant neoplasm of prostate: Secondary | ICD-10-CM

## 2019-04-13 NOTE — Telephone Encounter (Signed)
Spoke with patient denies having ongoing urinary issues or symptoms. He states it was discussed at the last office visit to recheck for an infection the reason he left a urine sample.

## 2019-04-14 ENCOUNTER — Telehealth: Payer: Self-pay | Admitting: Family Medicine

## 2019-04-14 LAB — MICROSCOPIC EXAMINATION
Bacteria, UA: NONE SEEN
Epithelial Cells (non renal): NONE SEEN /hpf (ref 0–10)

## 2019-04-14 LAB — URINALYSIS, COMPLETE
Bilirubin, UA: NEGATIVE
Glucose, UA: NEGATIVE
Ketones, UA: NEGATIVE
Leukocytes,UA: NEGATIVE
Nitrite, UA: NEGATIVE
Specific Gravity, UA: 1.01 (ref 1.005–1.030)
Urobilinogen, Ur: 0.2 mg/dL (ref 0.2–1.0)
pH, UA: 5 (ref 5.0–7.5)

## 2019-04-14 LAB — PSA: Prostate Specific Ag, Serum: 4.6 ng/mL — ABNORMAL HIGH (ref 0.0–4.0)

## 2019-04-14 NOTE — Telephone Encounter (Signed)
-----   Message from [name], MD sent at 04/14/2019  8:21 AM EDT ----- PSA has now risen to 4.6.  This is concerning and options need to be discussed.  This now meets the criteria for biochemical recurrence of his prostate cancer.  Please make him an appointment to come see me my next available.  [name], MD

## 2019-04-14 NOTE — Telephone Encounter (Signed)
Patient notified and voiced understanding.  Appointment scheduled.

## 2019-04-16 LAB — CULTURE, URINE COMPREHENSIVE

## 2019-04-26 ENCOUNTER — Telehealth: Payer: Self-pay | Admitting: Urology

## 2019-04-26 NOTE — Telephone Encounter (Signed)
Patient called today stating that he wanted an ABX for a possible UTI without coming in and being seen. He was speaking with Angie and told her that he did not have to be seen last time and that Dr. Erlene Quan just called him in something. She tried to get him to come in today and see Sam and he refused. She offered the valet parking and let him know we had assistants getting him to the office. I then took over the call and again offered an app and he said that he just wanted an ABX and there was no need to come in, I explained again that we needed to see him and check his urine for possible infection before we were able to give an ABX. He said he would just wait until his app with Dr. Erlene Quan in 05-11-19 and hung up.   Sharyn Lull

## 2019-04-26 NOTE — Telephone Encounter (Signed)
error 

## 2019-05-11 ENCOUNTER — Ambulatory Visit: Payer: PPO | Admitting: Urology

## 2019-05-11 ENCOUNTER — Other Ambulatory Visit: Payer: Self-pay

## 2019-05-11 VITALS — BP 124/78 | HR 92 | Ht 67.0 in | Wt 237.0 lb

## 2019-05-11 DIAGNOSIS — N390 Urinary tract infection, site not specified: Secondary | ICD-10-CM

## 2019-05-11 DIAGNOSIS — N3000 Acute cystitis without hematuria: Secondary | ICD-10-CM | POA: Diagnosis not present

## 2019-05-11 DIAGNOSIS — C61 Malignant neoplasm of prostate: Secondary | ICD-10-CM

## 2019-05-11 DIAGNOSIS — R350 Frequency of micturition: Secondary | ICD-10-CM | POA: Diagnosis not present

## 2019-05-11 MED ORDER — TRIMETHOPRIM 100 MG PO TABS: 100.0000 mg | ORAL_TABLET | Freq: Every day | ORAL | 0 refills | Status: DC

## 2019-05-11 MED ORDER — SULFAMETHOXAZOLE-TRIMETHOPRIM 800-160 MG PO TABS: 1.0000 | ORAL_TABLET | Freq: Two times a day (BID) | ORAL | 0 refills | Status: DC

## 2019-05-11 NOTE — Progress Notes (Signed)
05/11/2019 11:39 AM   Earl Ramirez 1940/10/04 [id number]  Referring provider: Baxter Hire, MD [place],  [place] [address]  Chief Complaint  Patient presents with  . 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  Notably, he status post channel TURP on 01/18/2019.    He was initially diagnosed with high risk prostate cancer, Gleason +4 by Dr. Jacqlyn Larsen in 2016. Staging was negative for metastatic disease. The time, his PSA was 19. He underwent EBRT with 1 year of ADT. Over the past several years, his PSA has risen slowly but steadily.  His most recent PSA was 4.6 on 04/13/2019.  Most recent cross-sectional [place] imaging in the form of PET scan on 12/15/2018 with no evidence of metastatic disease.  Patient reports today that over the past 2 weeks, he has developed dysuria urgency and frequency again.  Initially following a channel TURP, he was doing extremely well.  He is not had another urinary tract infection which was previously a severe problem for him prior to his TURP until about 2 weeks ago.  His symptoms have been stably bothersome for the past 2 weeks.  He reports that at baseline, he is voiding extremely well except for when he is infection.  No fevers or chills.  No flank pain.  His UA today is grossly positive.   PMH: Past Medical History:  Diagnosis Date  . Aortic valve replaced    1998  . Arthritis   . Atrial fibrillation ([place])   . COPD (chronic obstructive pulmonary disease) ([place])   . Dysrhythmia    atrial fib  . Hernia of abdominal wall   . Hypertension   . Indigestion   . Neuropathy   . Neuropathy   . Prostate cancer ([place])   . Sleep apnea   . Thrombocytopenia ([place])   . Tremors of nervous system   . Urinary problem     Surgical History: Past Surgical History:   Procedure Laterality Date  . CALDWELL LUC    . tongue      growth removed  . TRANSURETHRAL RESECTION OF PROSTATE N/A 01/18/2019   Procedure: CHANNEL TRANSURETHRAL RESECTION OF THE PROSTATE (TURP);  Surgeon: [name], MD;  Location: [facility] ORS;  Service: Urology;  Laterality: N/A;  . VALVE REPLACEMENT      Home Medications:  Allergies as of 05/11/2019      Reactions   Oxycodone-acetaminophen Nausea Only, Shortness Of Breath   Amoxicillin Itching   Hand itching   Penicillins Rash   Did it involve swelling of the face/tongue/throat, SOB, or low BP? No Did it involve sudden or severe rash/hives, skin peeling, or any reaction on the inside of your mouth or nose? No Did you need to seek medical attention at a hospital or doctor's office? No When did it last happen?4-5 years ago If all above answers are "NO", may proceed with cephalosporin use.      Medication List       Accurate as of May 11, 2019 11:59 PM. If you have any questions, ask your nurse or doctor.        ALPRAZolam 0.25 MG tablet Commonly known as: XANAX Take 0.25 mg by mouth daily as needed for anxiety.   aspirin EC 81 MG tablet Take 81 mg by mouth daily.   docusate sodium 100 MG  capsule Commonly known as: COLACE Take 100 mg by mouth daily as needed for moderate constipation.   docusate sodium 100 MG capsule Commonly known as: COLACE Take 1 capsule (100 mg total) by mouth 2 (two) times daily.   gabapentin 100 MG capsule Commonly known as: NEURONTIN Take 300 mg by mouth 3 (three) times daily.   HYDROcodone-acetaminophen 5-325 MG tablet Commonly known as: NORCO/VICODIN Take 1-2 tablets by mouth every 6 (six) hours as needed for moderate pain.   metoprolol succinate 25 MG 24 hr tablet Commonly known as: TOPROL-XL   sulfamethoxazole-trimethoprim 800-160 MG tablet Commonly known as: BACTRIM DS Take 1 tablet by mouth every 12 (twelve) hours. Started by: [name], MD   tamsulosin 0.4 MG  Caps capsule Commonly known as: FLOMAX Take 1 capsule (0.4 mg total) by mouth 2 (two) times daily.   trimethoprim 100 MG tablet Commonly known as: TRIMPEX Take 1 tablet (100 mg total) by mouth daily. Started by: [name], MD       Allergies:  Allergies  Allergen Reactions  . Oxycodone-Acetaminophen Nausea Only and Shortness Of Breath  . Amoxicillin Itching    Hand itching  . Penicillins Rash    Did it involve swelling of the face/tongue/throat, SOB, or low BP? No Did it involve sudden or severe rash/hives, skin peeling, or any reaction on the inside of your mouth or nose? No Did you need to seek medical attention at a hospital or doctor's office? No When did it last happen?4-5 years ago If all above answers are "NO", may proceed with cephalosporin use.     Family History: Family History  Problem Relation Age of Onset  . Heart attack Father   . Alcohol abuse Father   . Congestive Heart Failure Mother   . Alzheimer's disease Sister     Social History:  reports that he quit smoking about 18 years ago. His smoking use included cigarettes. He quit smokeless tobacco use about 18 years ago. He reports current alcohol use of about 10.0 standard drinks of alcohol per week. He reports that he does not use drugs.  ROS: UROLOGY Frequent Urination?: Yes Hard to postpone urination?: Yes Burning/pain with urination?: Yes Get up at night to urinate?: Yes Leakage of urine?: Yes Urine stream starts and stops?: Yes Trouble starting stream?: No Do you have to strain to urinate?: No Blood in urine?: No Urinary tract infection?: No Sexually transmitted disease?: No Injury to kidneys or bladder?: No Painful intercourse?: No Weak stream?: No Erection problems?: No Penile pain?: No  Gastrointestinal Nausea?: No Vomiting?: No Indigestion/heartburn?: No Diarrhea?: No Constipation?: No  Constitutional Fever: No Night sweats?: No Weight loss?: No Fatigue?: No   Skin Skin rash/lesions?: No Itching?: No  Eyes Blurred vision?: No Double vision?: No  Ears/Nose/Throat Sore throat?: No Sinus problems?: No  Hematologic/Lymphatic Swollen glands?: No Easy bruising?: No  Cardiovascular Leg swelling?: No Chest pain?: No  Respiratory Cough?: No Shortness of breath?: No  Endocrine Excessive thirst?: No  Musculoskeletal Back pain?: No Joint pain?: No  Neurological Headaches?: No Dizziness?: No  Psychologic Depression?: No Anxiety?: No  Physical Exam: BP 124/78   Pulse 92   Ht 5\' 7"  (1.702 m)   Wt 237 lb (107.5 kg)   BMI 37.12 kg/m   Constitutional:  Alert and oriented, No acute distress. HEENT:  AT, moist mucus membranes.  Trachea midline, no masses. Cardiovascular: No clubbing, cyanosis, or edema. Respiratory: Normal respiratory effort, no increased work of breathing. GI: Abdomen is soft, nontender,  nondistended, no abdominal masses GU: No CVA tenderness Lymph: No cervical or inguinal lymphadenopathy. Skin: No rashes, bruises or suspicious lesions. Neurologic: Grossly intact, no focal deficits, moving all 4 extremities. Psychiatric: Normal mood and affect.  Laboratory Data: Lab Results  Component Value Date   WBC 2.2 (L) 01/19/2019   HGB 12.4 (L) 01/19/2019   HCT 38.3 (L) 01/19/2019   MCV 100.5 (H) 01/19/2019   PLT 40 (L) 01/19/2019    Lab Results  Component Value Date   CREATININE 0.66 01/19/2019    Lab Results  Component Value Date   PSA 0.12 01/17/2016    Urinalysis Results for orders placed or performed in visit on 05/11/19  Microscopic Examination   URINE  Result Value Ref Range   WBC, UA >30 (A) 0 - 5 /hpf   RBC 11-30 (A) 0 - 2 /hpf   Epithelial Cells (non renal) None seen 0 - 10 /hpf   Bacteria, UA Many (A) None seen/Few  CULTURE, URINE COMPREHENSIVE   UR  Result Value Ref Range   Urine Culture, Comprehensive Preliminary report    Organism ID, Bacteria Comment   Urinalysis, Complete   Result Value Ref Range   Specific Gravity, UA >1.030 (H) 1.005 - 1.030   pH, UA 5.5 5.0 - 7.5   Color, UA Yellow Yellow   Appearance Ur Cloudy (A) Clear   Leukocytes,UA 1+ (A) Negative   Protein,UA 2+ (A) Negative/Trace   Glucose, UA Negative Negative   Ketones, UA Negative Negative   RBC, UA 2+ (A) Negative   Bilirubin, UA Negative Negative   Urobilinogen, Ur 0.2 0.2 - 1.0 mg/dL   Nitrite, UA Negative Negative   Microscopic Examination See below:      Pertinent Imaging:. No new interval imaigng   Assessment & Plan:    1. Prostate cancer [facility]) Personal history of high risk prostate cancer status post IMRT with ADT, course complete Unfortunately, his PSA continues to rise above his nadir and now officially just barely meets the criteria for biochemical recurrence Within his baseline urinary symptoms recurrent urinary tract infections, he is otherwise asymptomatic Fairly recent staging imaging without obvious metastatic disease We discussed alternatives today including repeat staging, resumption of ADT versus continued close surveillance which he favors Plan to follow very closely with repeat PSA in 4 months.  If it continues to rise, I would more strongly advocate for restaging imaging which we can discuss at that time.  He is agreeable this plan.  - Urinalysis, Complete  2. Acute cystitis without hematuria Symptomatic UTI We will go ahead and treat with Bactrim for a week Given his history of fairly frequent urinary tract infections which are difficult to suppress, will place him back on suppression antibiotics for 3 months again as we have done previously which was effective He is agreeable this plan Trimethoprim 100 mg daily for 90 days after completing  Treatment course Follow-up urine culture and adjust as needed - Urine culture  3. Recurrent UTI As above   Return in about 4 months (around 09/11/2019) for PSA/ UA/ PVR.  [name], MD  [facility] [address] [place], [place] [address] [phone]

## 2019-05-12 LAB — URINALYSIS, COMPLETE
Bilirubin, UA: NEGATIVE
Glucose, UA: NEGATIVE
Ketones, UA: NEGATIVE
Nitrite, UA: NEGATIVE
Specific Gravity, UA: >1.03 — ABNORMAL HIGH (ref 1.005–1.030)
Urobilinogen, Ur: 0.2 mg/dL (ref 0.2–1.0)
pH, UA: 5.5 (ref 5.0–7.5)

## 2019-05-12 LAB — MICROSCOPIC EXAMINATION
Epithelial Cells (non renal): NONE SEEN /hpf (ref 0–10)
WBC, UA: >30 /hpf — AB (ref 0–5)

## 2019-05-16 LAB — CULTURE, URINE COMPREHENSIVE

## 2019-06-01 ENCOUNTER — Telehealth: Payer: Self-pay | Admitting: *Deleted

## 2019-06-01 NOTE — Telephone Encounter (Signed)
Called pt to ask if would like to reschedule his CT scan to another day since it was cancelled. Pt stated would like to be called back in the next 2 weeks to reschedule. He is not ready at this time to have his scan done. Will call pt back in 2 weeks to reschedule appts for the lung nodule clinic. Pt verbalized understanding.

## 2019-06-02 ENCOUNTER — Ambulatory Visit: Admission: RE | Admit: 2019-06-02 | Payer: PPO | Source: Ambulatory Visit

## 2019-06-03 ENCOUNTER — Inpatient Hospital Stay: Payer: PPO | Admitting: Oncology

## 2019-06-25 ENCOUNTER — Telehealth: Payer: Self-pay | Admitting: *Deleted

## 2019-06-25 NOTE — Telephone Encounter (Signed)
Attempted to contact pt to see if is ready to reschedule CT scan and follow up in the lung nodule clinic. Pt did not answer and unable to leave message. Will try again at a later date.

## 2019-08-17 DIAGNOSIS — D696 Thrombocytopenia, unspecified: Secondary | ICD-10-CM | POA: Diagnosis not present

## 2019-08-24 DIAGNOSIS — G609 Hereditary and idiopathic neuropathy, unspecified: Secondary | ICD-10-CM | POA: Diagnosis not present

## 2019-08-24 DIAGNOSIS — J42 Unspecified chronic bronchitis: Secondary | ICD-10-CM | POA: Diagnosis not present

## 2019-08-24 DIAGNOSIS — I4892 Unspecified atrial flutter: Secondary | ICD-10-CM | POA: Diagnosis not present

## 2019-08-24 DIAGNOSIS — D61818 Other pancytopenia: Secondary | ICD-10-CM | POA: Diagnosis not present

## 2019-08-24 DIAGNOSIS — D696 Thrombocytopenia, unspecified: Secondary | ICD-10-CM | POA: Diagnosis not present

## 2019-08-24 DIAGNOSIS — Z0000 Encounter for general adult medical examination without abnormal findings: Secondary | ICD-10-CM | POA: Diagnosis not present

## 2019-09-02 ENCOUNTER — Other Ambulatory Visit: Payer: PPO

## 2019-09-08 DIAGNOSIS — I4892 Unspecified atrial flutter: Secondary | ICD-10-CM | POA: Diagnosis not present

## 2019-09-08 DIAGNOSIS — I359 Nonrheumatic aortic valve disorder, unspecified: Secondary | ICD-10-CM | POA: Diagnosis not present

## 2019-09-08 DIAGNOSIS — J42 Unspecified chronic bronchitis: Secondary | ICD-10-CM | POA: Diagnosis not present

## 2019-09-08 DIAGNOSIS — I482 Chronic atrial fibrillation, unspecified: Secondary | ICD-10-CM | POA: Diagnosis not present

## 2019-09-08 DIAGNOSIS — G4730 Sleep apnea, unspecified: Secondary | ICD-10-CM | POA: Diagnosis not present

## 2019-09-09 ENCOUNTER — Other Ambulatory Visit: Payer: PPO

## 2019-09-09 ENCOUNTER — Other Ambulatory Visit: Payer: Self-pay | Admitting: Urology

## 2019-09-09 ENCOUNTER — Other Ambulatory Visit: Payer: Self-pay | Admitting: *Deleted

## 2019-09-09 ENCOUNTER — Other Ambulatory Visit: Payer: Self-pay

## 2019-09-09 ENCOUNTER — Ambulatory Visit: Payer: PPO | Admitting: Urology

## 2019-09-09 DIAGNOSIS — C61 Malignant neoplasm of prostate: Secondary | ICD-10-CM

## 2019-09-09 DIAGNOSIS — N401 Enlarged prostate with lower urinary tract symptoms: Secondary | ICD-10-CM

## 2019-09-10 LAB — PSA: Prostate Specific Ag, Serum: 7.6 ng/mL — ABNORMAL HIGH (ref 0.0–4.0)

## 2019-09-14 ENCOUNTER — Encounter: Payer: Self-pay | Admitting: Urology

## 2019-09-14 ENCOUNTER — Other Ambulatory Visit: Payer: Self-pay

## 2019-09-14 ENCOUNTER — Ambulatory Visit: Payer: PPO | Admitting: Urology

## 2019-09-14 VITALS — BP 134/72 | HR 91 | Ht 67.0 in | Wt 250.0 lb

## 2019-09-14 DIAGNOSIS — C61 Malignant neoplasm of prostate: Secondary | ICD-10-CM

## 2019-09-14 DIAGNOSIS — R3129 Other microscopic hematuria: Secondary | ICD-10-CM | POA: Diagnosis not present

## 2019-09-14 DIAGNOSIS — R911 Solitary pulmonary nodule: Secondary | ICD-10-CM

## 2019-09-14 DIAGNOSIS — N401 Enlarged prostate with lower urinary tract symptoms: Secondary | ICD-10-CM

## 2019-09-14 LAB — URINALYSIS, COMPLETE
Bilirubin, UA: NEGATIVE
Glucose, UA: NEGATIVE
Ketones, UA: NEGATIVE
Leukocytes,UA: NEGATIVE
Nitrite, UA: NEGATIVE
Specific Gravity, UA: >1.03 — ABNORMAL HIGH (ref 1.005–1.030)
Urobilinogen, Ur: 0.2 mg/dL (ref 0.2–1.0)
pH, UA: 5.5 (ref 5.0–7.5)

## 2019-09-14 LAB — MICROSCOPIC EXAMINATION: Bacteria, UA: NONE SEEN

## 2019-09-14 LAB — BLADDER SCAN AMB NON-IMAGING: Scan Result: 10

## 2019-09-14 NOTE — Progress Notes (Signed)
09/14/2019 1:41 PM   Earl Ramirez 1940/10/11 [id number]  Referring provider: Baxter Hire, MD [place],  [place] [address]  Chief Complaint  Patient presents with  . Benign Prostatic Hypertrophy    HPI: 79 year old male with a personal history of prostate cancer/recurrent urinary tract infections who returns today for follow-up.  Please see previous notes for prostate cancer details.  Most recently, his PSA has been rising steadily.  At last visit 04/2019, his PSA had risen to 4.6 just barely meeting that criteria for biochemical recurrence.  He returns today with a repeat serial PSA now up to 7.6.  In terms of urination, he is doing better than he has in quite some time.  He under went recent channel TURP 6/20 secondary to ongoing severe urinary symptoms or recurrent infections.  He continues have a few infections and then most recently, was started on suppression antibiotics in the form of trimethoprim daily for 90 days.  He completed this 2 weeks ago.  Over the last 3+ months, he since that marked improvement in his urinary symptoms.  He feels like he is able to empty without urgency frequency.  Occasionally has some leakage but this is not bothersome to him.  No dysuria or gross hematuria.  He is extremely pleased.  He continues to be asymptomatic today.  No weight loss or bone pain.  He also has a personal history of pulmonary nodule and is overdue for lung imaging.   Component     Latest Ref Rng & Units 12/10/2016 06/11/2017 12/11/2017 07/22/2018  Prostate Specific Ag, Serum     0.0 - 4.0 ng/mL 0.5 1.0 1.4 2.1   Component     Latest Ref Rng & Units 10/06/2018 03/09/2019 04/13/2019 09/09/2019  Prostate Specific Ag, Serum     0.0 - 4.0 ng/mL 2.7 3.6 4.6 (H) 7.6 (H)    PMH: Past Medical History:  Diagnosis Date  . Aortic valve replaced    1998  . Arthritis   . Atrial fibrillation ([place])   . COPD (chronic obstructive pulmonary disease) ([place])   . Dysrhythmia     atrial fib  . Hernia of abdominal wall   . Hypertension   . Indigestion   . Neuropathy   . Neuropathy   . Prostate cancer ([place])   . Sleep apnea   . Thrombocytopenia ([place])   . Tremors of nervous system   . Urinary problem     Surgical History: Past Surgical History:  Procedure Laterality Date  . CALDWELL LUC    . tongue      growth removed  . TRANSURETHRAL RESECTION OF PROSTATE N/A 01/18/2019   Procedure: CHANNEL TRANSURETHRAL RESECTION OF THE PROSTATE (TURP);  Surgeon: [name], MD;  Location: [facility] ORS;  Service: Urology;  Laterality: N/A;  . VALVE REPLACEMENT      Home Medications:  Allergies as of 09/14/2019      Reactions   Oxycodone-acetaminophen Nausea Only, Shortness Of Breath   Amoxicillin Itching   Hand itching   Penicillins Rash   Did it involve swelling of the face/tongue/throat, SOB, or low BP? No Did it involve sudden or severe rash/hives, skin peeling, or any reaction on the inside of your mouth or nose? No Did you need to seek medical attention at a hospital or doctor's office? No When did it last happen?4-5 years ago If all above answers are "NO", may proceed with cephalosporin use.      Medication List  Accurate as of September 14, 2019  1:41 PM. If you have any questions, ask your nurse or doctor.        STOP taking these medications   sulfamethoxazole-trimethoprim 800-160 MG tablet Commonly known as: BACTRIM DS Stopped by: [name], MD   trimethoprim 100 MG tablet Commonly known as: TRIMPEX Stopped by: [name], MD     TAKE these medications   ALPRAZolam 0.25 MG tablet Commonly known as: XANAX Take 0.25 mg by mouth daily as needed for anxiety.   aspirin EC 81 MG tablet Take 81 mg by mouth daily.   docusate sodium 100 MG capsule Commonly known as: COLACE Take 1 capsule (100 mg total) by mouth 2 (two) times daily.   gabapentin 100 MG capsule Commonly known as: NEURONTIN Take 300 mg by mouth 3 (three) times  daily.   HYDROcodone-acetaminophen 5-325 MG tablet Commonly known as: NORCO/VICODIN Take 1-2 tablets by mouth every 6 (six) hours as needed for moderate pain.   metoprolol succinate 25 MG 24 hr tablet Commonly known as: TOPROL-XL   tamsulosin 0.4 MG Caps capsule Commonly known as: FLOMAX Take 1 capsule (0.4 mg total) by mouth 2 (two) times daily.       Allergies:  Allergies  Allergen Reactions  . Oxycodone-Acetaminophen Nausea Only and Shortness Of Breath  . Amoxicillin Itching    Hand itching  . Penicillins Rash    Did it involve swelling of the face/tongue/throat, SOB, or low BP? No Did it involve sudden or severe rash/hives, skin peeling, or any reaction on the inside of your mouth or nose? No Did you need to seek medical attention at a hospital or doctor's office? No When did it last happen?4-5 years ago If all above answers are "NO", may proceed with cephalosporin use.     Family History: Family History  Problem Relation Age of Onset  . Heart attack Father   . Alcohol abuse Father   . Congestive Heart Failure Mother   . Alzheimer's disease Sister     Social History:  reports that he quit smoking about 18 years ago. His smoking use included cigarettes. He quit smokeless tobacco use about 18 years ago. He reports current alcohol use of about 10.0 standard drinks of alcohol per week. He reports that he does not use drugs.  ROS: UROLOGY Frequent Urination?: No Hard to postpone urination?: No Burning/pain with urination?: No Get up at night to urinate?: No Leakage of urine?: No Urine stream starts and stops?: No Trouble starting stream?: No Do you have to strain to urinate?: No Blood in urine?: No Urinary tract infection?: No Sexually transmitted disease?: No Injury to kidneys or bladder?: No Painful intercourse?: No Weak stream?: No Erection problems?: No Penile pain?: No  Gastrointestinal Nausea?: No Vomiting?: No Indigestion/heartburn?: No  Diarrhea?: No Constipation?: No  Constitutional Fever: No Night sweats?: No Weight loss?: No Fatigue?: No  Skin Skin rash/lesions?: No Itching?: No  Eyes Blurred vision?: No Double vision?: No  Ears/Nose/Throat Sore throat?: No Sinus problems?: No  Hematologic/Lymphatic Swollen glands?: No Easy bruising?: No  Cardiovascular Leg swelling?: No Chest pain?: No  Respiratory Cough?: No Shortness of breath?: No  Endocrine Excessive thirst?: No  Musculoskeletal Back pain?: No Joint pain?: No  Neurological Headaches?: No Dizziness?: No  Psychologic Depression?: No Anxiety?: No  Physical Exam: BP 134/72   Pulse 91   Ht 5\' 7"  (1.702 m)   Wt 250 lb (113.4 kg)   BMI 39.16 kg/m   Constitutional:  Alert  and oriented, No acute distress. HEENT: [place] AT, moist mucus membranes.  Trachea midline, no masses. Cardiovascular: No clubbing, cyanosis, or edema. Respiratory: Normal respiratory effort, no increased work of breathing. Skin: No rashes, bruises or suspicious lesions. Neurologic: Grossly intact, no focal deficits, moving all 4 extremities. Psychiatric: Normal mood and affect.  Laboratory Data: Lab Results  Component Value Date   WBC 2.2 (L) 01/19/2019   HGB 12.4 (L) 01/19/2019   HCT 38.3 (L) 01/19/2019   MCV 100.5 (H) 01/19/2019   PLT 40 (L) 01/19/2019    Lab Results  Component Value Date   CREATININE 0.66 01/19/2019    PSA trend as above  Urinalysis UA today with small amount of microscopic blood, otherwise unremarkable.  See epic for details.  Results for orders placed or performed in visit on 09/14/19  Bladder Scan (Post Void Residual) in office  Result Value Ref Range   Scan Result 10      Assessment & Plan:    1. Benign prostatic hyperplasia with lower urinary tract symptoms Symptoms markedly improved after channel TURP  Continue Flomax  UA today without evidence of infection-we will hold off on further antibiotics or suppressive  antibiotics unless he has issues with recurrent infections in the future  - Urinalysis, Complete - Bladder Scan (Post Void Residual) in office  2. Prostate cancer [facility]) Lengthy discussion today about his rising PSA  He now clearly meets the definition for biochemical recurrence following XRT with fairly rapidly rising PSA  I recommended [place]man PET scan for staging purposes and resumption of ADT with and without additional oral agents which was briefly discussed today.  We did discuss possible long-term side effects of ADT including cardiac and bone concerns.  We will plan to have a virtual visit following these results both to discuss results as well as planning for resumption of ADT.  He is agreeable this plan. - NM PET (AXUMIN) SKULL BASE TO MID THIGH; Future  3. Solitary pulmonary nodule Followed by medical oncology, overdue for scan  We discussed the importance of follow-up and he will reach out to them to reschedule the study  4. Microscopic hematuria Incidental microscopic hematuria today, most recent cystoscopy 01/2019 at the time of TURP  Upper tract imaging is also up-to-date thus will defer further evaluation at this time but continue to monitor    Return in about 3 weeks (around 10/05/2019) for virtual f/u [place] PET.  [name], MD  [facility] [address] [place], [place] [address] [phone]

## 2019-09-17 ENCOUNTER — Telehealth: Payer: Self-pay

## 2019-09-17 NOTE — Telephone Encounter (Signed)
I s/w pt and he will come in office to sign a release for records to be sent to [facility] hospital.

## 2019-09-17 NOTE — Telephone Encounter (Signed)
Pt called and left voicemail asking if his records could be released to the [place]. Can you please call him and give him the information on what he needs to do. Thanks

## 2019-09-29 ENCOUNTER — Telehealth: Payer: Self-pay | Admitting: *Deleted

## 2019-09-29 NOTE — Telephone Encounter (Signed)
Follow up phone call made to patient to see if he was ready to reschedule his repeat CT scan to follow up lung nodule. Pt states that he has a PET scan scheduled on 3/2 to follow up prostate cancer and would like to wait for those results before scheduling another scan. Instructed pt that will be in touch after his PET scan to further discuss repeat imaging. Nothing further needed at this time.

## 2019-10-05 ENCOUNTER — Encounter
Admission: RE | Admit: 2019-10-05 | Discharge: 2019-10-05 | Disposition: A | Discharge status: Home / Self Care | Payer: PPO | Source: Ambulatory Visit | Attending: Urology | Admitting: Urology

## 2019-10-05 ENCOUNTER — Other Ambulatory Visit: Payer: Self-pay

## 2019-10-05 DIAGNOSIS — C61 Malignant neoplasm of prostate: Secondary | ICD-10-CM | POA: Diagnosis not present

## 2019-10-05 MED ORDER — AXUMIN (FLUCICLOVINE F 18) INJECTION
10.0000 | Freq: Once | INTRAVENOUS | Status: AC | PRN
  Administered 2019-10-05: 10.99 via INTRAVENOUS

## 2019-10-05 NOTE — Progress Notes (Signed)
Virtual Visit via Telephone Note  I connected with Earl Ramirez on 10/06/2019 at 4:00 PM EST by telephone and verified that I am speaking with the correct person using two identifiers.  Location: Patient: At home  Provider: Office   I discussed the limitations, risks, security and privacy concerns of performing an evaluation and management service by telephone and the availability of in person appointments. I also discussed with the patient that there may be a patient responsible charge related to this service. The patient expressed understanding and agreed to proceed.  History of Present Illness: Earl Ramirez is a 79 yo white M with a personal hx of prostate cancer/rUTIs and pulmonary nodule who returns today 3 week f/u.   Most recently, his PSA has been rising steadily.  At last visit 04/2019, his PSA had risen to 4.6 just barely meeting that criteria for biochemical recurrence.  He returns today with a repeat serial PSA now up to 7.6.  His NM PET (Axumin) shows concern for skeletal metastatic right acetabulum and anteromedial left rib.   Stable nodule on lungs.  He reports of no specific pain associated with the hip and ribs but does have back pain.   He is doing well urination wise and has no symptoms. No bone pain.   He does not take calcium and vitamin D pills.   Component     Latest Ref Rng & Units 12/10/2016 06/11/2017 12/11/2017 07/22/2018  Prostate Specific Ag, Serum     0.0 - 4.0 ng/mL 0.5 1.0 1.4 2.1   Component     Latest Ref Rng & Units 10/06/2018 03/09/2019 04/13/2019 09/09/2019  Prostate Specific Ag, Serum     0.0 - 4.0 ng/mL 2.7 3.6 4.6 (H) 7.6 (H)    Observations/Objective: Pt is engaged and asking good questions  Pertinent Imagings: CLINICAL DATA:  Prostate carcinoma with biochemical recurrence. Radiation treatment to the prostate gland.  EXAM: NUCLEAR MEDICINE PET SKULL BASE TO THIGH  TECHNIQUE: 11.0 mCi F-18 Fluciclovine was injected intravenously. Full-ring  PET imaging was performed from the skull base to thigh after the radiotracer. CT data was obtained and used for attenuation correction and anatomic localization.  COMPARISON:  PET-CT 12/15/2018  FINDINGS: NECK  No radiotracer activity in neck lymph nodes.  Incidental CT finding: None  CHEST  No radiotracer accumulation within mediastinal or hilar lymph nodes. No suspicious pulmonary nodules on the CT scan.  Incidental CT finding: 16 mm pulmonary nodule in the RIGHT middle lobe unchanged in size from comparison PET-CT 12/15/2018. This lesion was non significantly metabolic at that time. Lesion currently has trace Axumin accumulation.  ABDOMEN/PELVIS  Prostate: No focal activity in the prostate gland.  Lymph nodes: No enlarged or radiotracer avid pelvic or retroperitoneal lymph nodes.  Liver: No evidence of liver metastasis  Incidental CT finding: Several large gallstones. The spleen is enlarged to 19 mm.  SKELETON  Focus of intense radiotracer uptake within the anterior column of the RIGHT acetabulum with SUV max equal 11.3 (image 81 of the fused data set). No clear lesion on the CT portion exam.  Focal uptake within the anteromedial LEFT fourth rib with SUV max equal 5.2.  Scattered uptake within the spinal to favored benign background. Photopenia in the pelvis related to radiation suppression of the marrow.  IMPRESSION: 1. No evidence of local prostate cancer recurrence or nodal metastasis in the abdomen pelvis. 2. Fairly high concern for skeletal metastasis with focal lesion in the RIGHT acetabulum and anteromedial LEFT rib.  Most suspicious lesion is RIGHT acetabular lesion. 3. Diffuse activity in the marrow of the spine is favored benign. 4. Photopenia in the marrow of the pelvis related to radiation marrow suppression. 5. Stable RIGHT middle lobe pulmonary nodule is favored benign. 6. Splenomegaly.   Electronically Signed   By:  Earl Ramirez M.D.   On: 10/05/2019 16:03  I have personally reviewed the images and agree with radiologist interpretation.   Assessment and Plan:  1. 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 The patient was provided an opportunity to ask questions and all were answered. The patient agreed with the plan and demonstrated an understanding of the instructions.   The patient was advised to call back or seek an in-person evaluation if the symptoms worsen or if the condition fails to improve as anticipated.  I spent 30  total minutes on the day of the encounter including pre-visit review of the medical record, face-to-face time with the patient, and post visit ordering of labs/imaging/tests.      [name], MD

## 2019-10-06 ENCOUNTER — Telehealth (INDEPENDENT_AMBULATORY_CARE_PROVIDER_SITE_OTHER): Payer: PPO | Admitting: Urology

## 2019-10-06 DIAGNOSIS — C61 Malignant neoplasm of prostate: Secondary | ICD-10-CM | POA: Diagnosis not present

## 2019-10-07 ENCOUNTER — Telehealth: Payer: Self-pay

## 2019-10-07 NOTE — Telephone Encounter (Signed)
Prior authorization forms faxed for approval of Eligard.

## 2019-10-10 NOTE — Progress Notes (Signed)
[facility]  [phone] [phone]  ID: [name] OB: 1940-09-23  MR#: [id number]  [id number]  Patient Care Team: Baxter Hire, MD as PCP - General (Internal Medicine) Murrell Redden, MD (Urology) Teodoro Spray, MD (Cardiology)  CHIEF COMPLAINT: Stage IVB prostate cancer.  INTERVAL HISTORY: Patient is an 79 year old male who was last seen in clinic over 3 years ago who has a history of localized prostate cancer treated with XRT in 2016.  Recently it was noted his PSA was increasing and found to have bony metastasis.  He currently feels well and is asymptomatic.  He has no neurologic complaints.  He denies any recent fevers or illnesses.  He has a good appetite and denies weight loss.  He denies any pain.  He has no chest pain, shortness of breath, cough, or hemoptysis.  He denies any nausea, vomiting, constipation, or diarrhea.  He has no urinary complaints.  Patient feels at his baseline offers no specific complaints today.  REVIEW OF SYSTEMS:   Review of Systems  Constitutional: Negative.  Negative for fever, malaise/fatigue and weight loss.  Respiratory: Negative.  Negative for cough, hemoptysis and shortness of breath.   Cardiovascular: Negative.  Negative for chest pain and leg swelling.  Gastrointestinal: Negative.  Negative for abdominal pain and constipation.  Genitourinary: Negative.  Negative for frequency.  Musculoskeletal: Negative.  Negative for back pain and neck pain.  Skin: Negative.  Negative for rash.  Neurological: Negative.  Negative for dizziness, focal weakness, weakness and headaches.  Psychiatric/Behavioral: Negative.  The patient is not nervous/anxious.     As per HPI. Otherwise, a complete review of systems is negative.  PAST MEDICAL HISTORY: Past Medical History:  Diagnosis Date  . Aortic valve replaced    1998  . Arthritis   . Atrial fibrillation ([place])   . COPD (chronic obstructive pulmonary  disease) ([place])   . Dysrhythmia    atrial fib  . Hernia of abdominal wall   . Hypertension   . Indigestion   . Neuropathy   . Neuropathy   . Prostate cancer ([place])   . Sleep apnea   . Thrombocytopenia ([place])   . Tremors of nervous system   . Urinary problem     PAST SURGICAL HISTORY: Past Surgical History:  Procedure Laterality Date  . CALDWELL LUC    . tongue      growth removed  . TRANSURETHRAL RESECTION OF PROSTATE N/A 01/18/2019   Procedure: CHANNEL TRANSURETHRAL RESECTION OF THE PROSTATE (TURP);  Surgeon: [name], MD;  Location: [facility] ORS;  Service: Urology;  Laterality: N/A;  . VALVE REPLACEMENT      FAMILY HISTORY: Family History  Problem Relation Age of Onset  . Heart attack Father   . Alcohol abuse Father   . Congestive Heart Failure Mother   . Alzheimer's disease Sister     ADVANCED DIRECTIVES (Y/N):  N  HEALTH MAINTENANCE: Social History   Tobacco Use  . Smoking status: Former Smoker    Types: Cigarettes    Quit date: 01/13/2001    Years since quitting: 18.7  . Smokeless tobacco: Former Systems developer    Quit date: 09/24/2000  Substance Use Topics  . Alcohol use: Yes    Alcohol/week: 10.0 standard drinks    Types: 10 Cans of beer per week  . Drug use: No     Colonoscopy:  PAP:  Bone density:  Lipid panel:  Allergies  Allergen Reactions  . Oxycodone-Acetaminophen Nausea Only and  Shortness Of Breath  . Amoxicillin Itching    Hand itching  . Penicillins Rash    Did it involve swelling of the face/tongue/throat, SOB, or low BP? No Did it involve sudden or severe rash/hives, skin peeling, or any reaction on the inside of your mouth or nose? No Did you need to seek medical attention at a hospital or doctor's office? No When did it last happen?4-5 years ago If all above answers are "NO", may proceed with cephalosporin use.     Current Outpatient Medications  Medication Sig Dispense Refill  . ALPRAZolam (XANAX) 0.25 MG tablet Take 0.25 mg by  mouth daily as needed for anxiety.     Marland Kitchen aspirin EC 81 MG tablet Take 81 mg by mouth daily.     Marland Kitchen gabapentin (NEURONTIN) 300 MG capsule Take 900 mg by mouth 4 (four) times daily.    . metoprolol succinate (TOPROL-XL) 25 MG 24 hr tablet Take 25 mg by mouth daily.     . tamsulosin (FLOMAX) 0.4 MG CAPS capsule Take 1 capsule (0.4 mg total) by mouth 2 (two) times daily. 180 capsule 0  . docusate sodium (COLACE) 100 MG capsule Take 1 capsule (100 mg total) by mouth 2 (two) times daily. (Patient not taking: Reported on 10/14/2019) 60 capsule 0  . HYDROcodone-acetaminophen (NORCO/VICODIN) 5-325 MG tablet Take 1-2 tablets by mouth every 6 (six) hours as needed for moderate pain. (Patient not taking: Reported on 10/14/2019) 6 tablet 0   No current facility-administered medications for this visit.    OBJECTIVE: Vitals:   10/14/19 1340  BP: (!) 127/53  Pulse: 68  Temp: (!) 97.5 F (36.4 C)     Body mass index is 38.42 kg/m.    ECOG FS:0 - Asymptomatic  General: Well-developed, well-nourished, no acute distress. Eyes: Pink conjunctiva, anicteric sclera. HEENT: Normocephalic, moist mucous membranes. Lungs: No audible wheezing or coughing. Heart: Regular rate and rhythm. Abdomen: Soft, nontender, no obvious distention. Musculoskeletal: No edema, cyanosis, or clubbing. Neuro: Alert, answering all questions appropriately. Cranial nerves grossly intact. Skin: No rashes or petechiae noted. Psych: Normal affect. Lymphatics: No cervical, calvicular, axillary or inguinal LAD.   LAB RESULTS:  Lab Results  Component Value Date   NA 142 01/19/2019   K 4.0 01/19/2019   CL 109 01/19/2019   CO2 25 01/19/2019   GLUCOSE 111 (H) 01/19/2019   BUN 16 01/19/2019   CREATININE 0.66 01/19/2019   CALCIUM 8.2 (L) 01/19/2019   GFRNONAA >60 01/19/2019   GFRAA >60 01/19/2019    Lab Results  Component Value Date   WBC 2.2 (L) 01/19/2019   NEUTROABS 1.4 12/11/2016   HGB 12.4 (L) 01/19/2019   HCT 38.3 (L)  01/19/2019   MCV 100.5 (H) 01/19/2019   PLT 40 (L) 01/19/2019     STUDIES: NM PET (AXUMIN) SKULL BASE TO MID THIGH  Result Date: 10/05/2019 CLINICAL DATA:  Prostate carcinoma with biochemical recurrence. Radiation treatment to the prostate gland. EXAM: NUCLEAR MEDICINE PET SKULL BASE TO THIGH TECHNIQUE: 11.0 mCi F-18 Fluciclovine was injected intravenously. Full-ring PET imaging was performed from the skull base to thigh after the radiotracer. CT data was obtained and used for attenuation correction and anatomic localization. COMPARISON:  PET-CT 12/15/2018 FINDINGS: NECK No radiotracer activity in neck lymph nodes. Incidental CT finding: None CHEST No radiotracer accumulation within mediastinal or hilar lymph nodes. No suspicious pulmonary nodules on the CT scan. Incidental CT finding: 16 mm pulmonary nodule in the RIGHT middle lobe unchanged in size from  comparison PET-CT 12/15/2018. This lesion was non significantly metabolic at that time. Lesion currently has trace Axumin accumulation. ABDOMEN/PELVIS Prostate: No focal activity in the prostate gland. Lymph nodes: No enlarged or radiotracer avid pelvic or retroperitoneal lymph nodes. Liver: No evidence of liver metastasis Incidental CT finding: Several large gallstones. The spleen is enlarged to 19 mm. SKELETON Focus of intense radiotracer uptake within the anterior column of the RIGHT acetabulum with SUV max equal 11.3 (image 81 of the fused data set). No clear lesion on the CT portion exam. Focal uptake within the anteromedial LEFT fourth rib with SUV max equal 5.2. Scattered uptake within the spinal to favored benign background. Photopenia in the pelvis related to radiation suppression of the marrow. IMPRESSION: 1. No evidence of local prostate cancer recurrence or nodal metastasis in the abdomen pelvis. 2. Fairly high concern for skeletal metastasis with focal lesion in the RIGHT acetabulum and anteromedial LEFT rib. Most suspicious lesion is RIGHT  acetabular lesion. 3. Diffuse activity in the marrow of the spine is favored benign. 4. Photopenia in the marrow of the pelvis related to radiation marrow suppression. 5. Stable RIGHT middle lobe pulmonary nodule is favored benign. 6. Splenomegaly. Electronically Signed   By: Suzy Bouchard M.D.   On: 10/05/2019 16:03    ASSESSMENT: Stage IVB prostate cancer  PLAN:    1.  Stage IVB prostate cancer: Patient initially received XRT for local control disease in 2016.  More recently his PSA trended up to 7.6.  Axiom PET scan reviewed independently and reported as above with multiple bony lesions noted.  Patient received his first dose of Firmagon on Monday, October 11, 2019.  He will also benefit from [facility] for his bony lesions.  Will not initiate Zytiga or Xtandi at this time given his minimal tumor burden, but can consider this in the future if needed.  Patient return to clinic next week for Xgeva only.  He will then return to clinic in 5 weeks for further evaluation and continuation of both [place].  After 3 or 4 injections of Firmagon, will transition patient to [place]. 2.  Pancytopenia: Repeat CBC with next lab draw.  I spent a total of 60 minutes reviewing chart data, face-to-face evaluation with the patient, counseling and coordination of care as detailed above.   Patient expressed understanding and was in agreement with this plan. He also understands that He can call clinic at any time with any questions, concerns, or complaints.   Cancer Staging Prostate cancer [facility]) Staging form: Prostate, AJCC 8th Edition - Clinical: Stage IVB (cTX, cN0, pM1b, PSA: 7.6) - Signed by Lloyd Huger, MD on 10/14/2019   Lloyd Huger, MD   10/14/2019 3:58 PM

## 2019-10-11 ENCOUNTER — Ambulatory Visit (INDEPENDENT_AMBULATORY_CARE_PROVIDER_SITE_OTHER): Payer: PPO

## 2019-10-11 ENCOUNTER — Other Ambulatory Visit: Payer: Self-pay

## 2019-10-11 DIAGNOSIS — C61 Malignant neoplasm of prostate: Secondary | ICD-10-CM

## 2019-10-11 MED ORDER — DEGARELIX ACETATE(240 MG DOSE) 120 MG/VIAL ~~LOC~~ SOLR
240.0000 mg | Freq: Once | SUBCUTANEOUS | Status: AC
  Administered 2019-10-11: 240 mg via SUBCUTANEOUS

## 2019-10-11 NOTE — Patient Instructions (Signed)
Degarelix injection What is this medicine? DEGARELIX (deg a REL ix) is used to treat men with advanced prostate cancer. This medicine may be used for other purposes; ask your health care provider or pharmacist if you have questions. COMMON BRAND NAME(S): Degarelix, Firmagon What should I tell my health care provider before I take this medicine? They need to know if you have any of these conditions:  diabetes  heart disease  kidney disease  liver disease  low levels of potassium or magnesium in the blood  osteoporosis  an unusual or allergic reaction to degarelix, mannitol, other medicines, foods, dyes, or preservatives  pregnant or trying to get pregnant  breast-feeding How should I use this medicine? This medicine is for injection under the skin. It is usually given by a health care professional in a hospital or clinic setting. If you get this medicine at home, you will be taught how to prepare and give this medicine. Use exactly as directed. Take your medicine at regular intervals. Do not take it more often than directed. It is important that you put your used needles and syringes in a special sharps container. Do not put them in a trash can. If you do not have a sharps container, call your pharmacist or healthcare provider to get one. Talk to your pediatrician regarding the use of this medicine in children. Special care may be needed. Overdosage: If you think you have taken too much of this medicine contact a poison control center or emergency room at once. NOTE: This medicine is only for you. Do not share this medicine with others. What if I miss a dose? Try not to miss a dose. If you do miss a dose, call your doctor or health care professional for advice. What may interact with this medicine? Do not take this medicine with any of the following medications:  amiodarone  bretylium  disopyramide  droperidol  ibutilide  procainamide  quinidine  sotalol This  medicine may also interact with the following medications:  dofetilide This list may not describe all possible interactions. Give your health care provider a list of all the medicines, herbs, non-prescription drugs, or dietary supplements you use. Also tell them if you smoke, drink alcohol, or use illegal drugs. Some items may interact with your medicine. What should I watch for while using this medicine? Visit your doctor or health care professional for regular checks on your progress and discuss any issues before you start taking this medicine. Do not rub or scratch injection site. There may be a lump at the injection site, or it may be red or sore for a few days after your dose. Your doctor or health care professional will need to monitor your hormone levels in your blood to check your response to treatment. Try to keep any appointments for testing. What side effects may I notice from receiving this medicine? Side effects that you should report to your doctor or health care professional as soon as possible:  allergic reactions like skin rash, itching or hives, swelling of the face, lips, or tongue  fever or chills  irregular heartbeat  nausea and vomiting along with severe abdominal pain  pain or difficulty passing urine  pelvic pain or bloating  signs and symptoms of high blood sugar such as being more thirsty or hungry or having to urinate more than normal. You may also feel very tired or have blurry vision Side effects that usually do not require medical attention (report to your doctor or health   care professional if they continue or are bothersome):  change in sex drive or performance  constipation  headache  high blood pressure  hot flashes (flushing of skin, increased sweating)  itching, redness or mild pain at site where injected  joint pain  trouble sleeping  unusually weak or tired  weight gain This list may not describe all possible side effects. Call your  doctor for medical advice about side effects. You may report side effects to FDA at 1-800-FDA-1088. Where should I keep my medicine? Keep out of the reach of children. This drug is usually given in a hospital or clinic and will not be stored at home. In rare cases, this medicine may be given at home. If you are using this medicine at home, you will be instructed on how to store this medicine. Throw away any unused medicine after the expiration date on the label. NOTE: This sheet is a summary. It may not cover all possible information. If you have questions about this medicine, talk to your doctor, pharmacist, or health care provider.  2020 Elsevier/Gold Standard (2018-10-05 12:20:52)  

## 2019-10-11 NOTE — Progress Notes (Signed)
Firmagon Sub Q Injection  Due to Prostate Cancer patient is present today for a Firmagon Injection.   Medication: [name] (Degarelix)  Dose: 240mg  Initiation dose  Location: Bilateral lower abdomen Lot: [id number] Exp: 10/2021  Patient tolerated well, no complications were noted.  Performed by: Gordy Clement, CMA (AAMA)  Follow up: RTC as scheduled

## 2019-10-13 ENCOUNTER — Telehealth: Payer: Self-pay

## 2019-10-13 NOTE — Telephone Encounter (Signed)
Firmagon 240mg  prior auth request was started online. Benefits center confirmation states Earl Ramirez [phone] covered at 80% and medical administration is covered at 100% no prior auth required

## 2019-10-14 ENCOUNTER — Other Ambulatory Visit: Payer: Self-pay

## 2019-10-14 ENCOUNTER — Telehealth: Payer: Self-pay | Admitting: Urology

## 2019-10-14 ENCOUNTER — Telehealth: Payer: Self-pay

## 2019-10-14 ENCOUNTER — Encounter: Payer: Self-pay | Admitting: Oncology

## 2019-10-14 ENCOUNTER — Inpatient Hospital Stay: Payer: PPO | Attending: Oncology | Admitting: Oncology

## 2019-10-14 VITALS — BP 127/53 | HR 68 | Temp 97.5°F | Wt 245.3 lb

## 2019-10-14 DIAGNOSIS — G4730 Sleep apnea, unspecified: Secondary | ICD-10-CM | POA: Diagnosis not present

## 2019-10-14 DIAGNOSIS — C7951 Secondary malignant neoplasm of bone: Secondary | ICD-10-CM | POA: Insufficient documentation

## 2019-10-14 DIAGNOSIS — L539 Erythematous condition, unspecified: Secondary | ICD-10-CM | POA: Diagnosis not present

## 2019-10-14 DIAGNOSIS — Z79899 Other long term (current) drug therapy: Secondary | ICD-10-CM | POA: Diagnosis not present

## 2019-10-14 DIAGNOSIS — I4891 Unspecified atrial fibrillation: Secondary | ICD-10-CM | POA: Diagnosis not present

## 2019-10-14 DIAGNOSIS — D696 Thrombocytopenia, unspecified: Secondary | ICD-10-CM | POA: Insufficient documentation

## 2019-10-14 DIAGNOSIS — M129 Arthropathy, unspecified: Secondary | ICD-10-CM | POA: Insufficient documentation

## 2019-10-14 DIAGNOSIS — J449 Chronic obstructive pulmonary disease, unspecified: Secondary | ICD-10-CM | POA: Diagnosis not present

## 2019-10-14 DIAGNOSIS — D61818 Other pancytopenia: Secondary | ICD-10-CM | POA: Insufficient documentation

## 2019-10-14 DIAGNOSIS — Z87891 Personal history of nicotine dependence: Secondary | ICD-10-CM | POA: Diagnosis not present

## 2019-10-14 DIAGNOSIS — R161 Splenomegaly, not elsewhere classified: Secondary | ICD-10-CM | POA: Diagnosis not present

## 2019-10-14 DIAGNOSIS — G629 Polyneuropathy, unspecified: Secondary | ICD-10-CM | POA: Diagnosis not present

## 2019-10-14 DIAGNOSIS — R911 Solitary pulmonary nodule: Secondary | ICD-10-CM | POA: Diagnosis not present

## 2019-10-14 DIAGNOSIS — I10 Essential (primary) hypertension: Secondary | ICD-10-CM | POA: Diagnosis not present

## 2019-10-14 DIAGNOSIS — Z7982 Long term (current) use of aspirin: Secondary | ICD-10-CM | POA: Diagnosis not present

## 2019-10-14 DIAGNOSIS — C61 Malignant neoplasm of prostate: Secondary | ICD-10-CM | POA: Diagnosis not present

## 2019-10-14 NOTE — Telephone Encounter (Signed)
Earl Ramirez's care was discussed with Dr. [name] today.  Will defer further ADT to his office in order to consolidate treatments at 1 location per patient preference.  Please cancel our next Eligard injection in the office.  I like to see Earl Ramirez in about 6 months for recheck of urinary symptoms.  [name], MD

## 2019-10-14 NOTE — Telephone Encounter (Signed)
Incoming fax from pt's carrier stating that Eligard is covered under the pt's medical plan but requires a Prior Auth, Prior auth form completed and faxed to [employer].

## 2019-10-14 NOTE — Telephone Encounter (Addendum)
Patient informed, follow up appointment scheduled. Voiced understanding.

## 2019-10-21 ENCOUNTER — Inpatient Hospital Stay: Payer: PPO

## 2019-10-22 ENCOUNTER — Other Ambulatory Visit: Payer: Self-pay

## 2019-10-22 ENCOUNTER — Inpatient Hospital Stay: Payer: PPO

## 2019-10-22 ENCOUNTER — Inpatient Hospital Stay (HOSPITAL_BASED_OUTPATIENT_CLINIC_OR_DEPARTMENT_OTHER): Payer: PPO | Admitting: Oncology

## 2019-10-22 ENCOUNTER — Encounter: Payer: Self-pay | Admitting: Oncology

## 2019-10-22 VITALS — BP 148/66 | HR 71 | Temp 94.9°F | Wt 246.8 lb

## 2019-10-22 DIAGNOSIS — L03319 Cellulitis of trunk, unspecified: Secondary | ICD-10-CM

## 2019-10-22 DIAGNOSIS — C7951 Secondary malignant neoplasm of bone: Secondary | ICD-10-CM | POA: Diagnosis not present

## 2019-10-22 DIAGNOSIS — C61 Malignant neoplasm of prostate: Secondary | ICD-10-CM

## 2019-10-22 LAB — BASIC METABOLIC PANEL
Anion gap: 7 (ref 5–15)
BUN: 21 mg/dL (ref 8–23)
CO2: 25 mmol/L (ref 22–32)
Calcium: 8.4 mg/dL — ABNORMAL LOW (ref 8.9–10.3)
Chloride: 109 mmol/L (ref 98–111)
Creatinine, Ser: 0.67 mg/dL (ref 0.61–1.24)
GFR calc Af Amer: >60 mL/min (ref >60)
GFR calc non Af Amer: >60 mL/min (ref >60)
Glucose, Bld: 98 mg/dL (ref 70–99)
Potassium: 4.5 mmol/L (ref 3.5–5.1)
Sodium: 141 mmol/L (ref 135–145)

## 2019-10-22 LAB — CBC WITH DIFFERENTIAL/PLATELET
Abs Immature Granulocytes: 0.01 10*3/uL (ref 0.00–0.07)
Basophils Absolute: 0 10*3/uL (ref 0.0–0.1)
Basophils Relative: 1 %
Eosinophils Absolute: 0 10*3/uL (ref 0.0–0.5)
Eosinophils Relative: 2 %
HCT: 39.9 % (ref 39.0–52.0)
Hemoglobin: 12.9 g/dL — ABNORMAL LOW (ref 13.0–17.0)
Immature Granulocytes: 1 %
Lymphocytes Relative: 19 %
Lymphs Abs: 0.4 10*3/uL — ABNORMAL LOW (ref 0.7–4.0)
MCH: 32.6 pg (ref 26.0–34.0)
MCHC: 32.3 g/dL (ref 30.0–36.0)
MCV: 100.8 fL — ABNORMAL HIGH (ref 80.0–100.0)
Monocytes Absolute: 0.2 10*3/uL (ref 0.1–1.0)
Monocytes Relative: 7 %
Neutro Abs: 1.5 10*3/uL — ABNORMAL LOW (ref 1.7–7.7)
Neutrophils Relative %: 70 %
Platelets: 47 10*3/uL — ABNORMAL LOW (ref 150–400)
RBC: 3.96 MIL/uL — ABNORMAL LOW (ref 4.22–5.81)
RDW: 13.4 % (ref 11.5–15.5)
WBC: 2.1 10*3/uL — ABNORMAL LOW (ref 4.0–10.5)
nRBC: 0 % (ref 0.0–0.2)

## 2019-10-22 LAB — PSA: Prostatic Specific Antigen: 6.1 ng/mL — ABNORMAL HIGH (ref 0.00–4.00)

## 2019-10-22 MED ORDER — DENOSUMAB 120 MG/1.7ML ~~LOC~~ SOLN
120.0000 mg | Freq: Once | SUBCUTANEOUS | Status: AC
  Administered 2019-10-22: 120 mg via SUBCUTANEOUS
  Filled 2019-10-22: qty 1.7

## 2019-10-22 MED ORDER — DOXYCYCLINE HYCLATE 100 MG PO TABS: 100.0000 mg | ORAL_TABLET | Freq: Two times a day (BID) | ORAL | 0 refills | Status: DC

## 2019-10-22 NOTE — Progress Notes (Signed)
[place]  [phone] [phone]  ID: Earl Ramirez OB: Dec 12, 1940  MR#: [id number]  [id number]  Patient Care Team: Baxter Hire, MD as PCP - General (Internal Medicine) Murrell Redden, MD (Urology) Teodoro Spray, MD (Cardiology)  CHIEF COMPLAINT: Stage IVB prostate cancer.  INTERVAL HISTORY: Patient returns to clinic today as an add-on after complaints of continued erythema and pain at the site of his [name] injections a week and a half ago.  He otherwise feels well and is asymptomatic.  He has no neurologic complaints.  He denies any recent fevers or illnesses.  He has a good appetite and denies weight loss.  He denies any pain.  He has no chest pain, shortness of breath, cough, or hemoptysis.  He denies any nausea, vomiting, constipation, or diarrhea.  He has no urinary complaints.  Patient offers no further specific complaints today.    REVIEW OF SYSTEMS:   Review of Systems  Constitutional: Negative.  Negative for fever, malaise/fatigue and weight loss.  Respiratory: Negative.  Negative for cough, hemoptysis and shortness of breath.   Cardiovascular: Negative.  Negative for chest pain and leg swelling.  Gastrointestinal: Negative.  Negative for abdominal pain and constipation.  Genitourinary: Negative.  Negative for frequency.  Musculoskeletal: Negative.  Negative for back pain and neck pain.  Skin: Negative.  Negative for rash.       Erythema and tenderness at site of previous injection.  Neurological: Negative.  Negative for dizziness, focal weakness, weakness and headaches.  Psychiatric/Behavioral: Negative.  The patient is not nervous/anxious.     As per HPI. Otherwise, a complete review of systems is negative.  PAST MEDICAL HISTORY: Past Medical History:  Diagnosis Date  . Aortic valve replaced    1998  . Arthritis   . Atrial fibrillation ([place])   . COPD (chronic obstructive pulmonary disease) ([place])   . Dysrhythmia     atrial fib  . Hernia of abdominal wall   . Hypertension   . Indigestion   . Neuropathy   . Neuropathy   . Prostate cancer ([place])   . Sleep apnea   . Thrombocytopenia ([place])   . Tremors of nervous system   . Urinary problem     PAST SURGICAL HISTORY: Past Surgical History:  Procedure Laterality Date  . CALDWELL LUC    . tongue      growth removed  . TRANSURETHRAL RESECTION OF PROSTATE N/A 01/18/2019   Procedure: CHANNEL TRANSURETHRAL RESECTION OF THE PROSTATE (TURP);  Surgeon: [name], MD;  Location: [facility] ORS;  Service: Urology;  Laterality: N/A;  . VALVE REPLACEMENT      FAMILY HISTORY: Family History  Problem Relation Age of Onset  . Heart attack Father   . Alcohol abuse Father   . Congestive Heart Failure Mother   . Alzheimer's disease Sister     ADVANCED DIRECTIVES (Y/N):  N  HEALTH MAINTENANCE: Social History   Tobacco Use  . Smoking status: Former Smoker    Types: Cigarettes    Quit date: 01/13/2001    Years since quitting: 18.7  . Smokeless tobacco: Former Systems developer    Quit date: 09/24/2000  Substance Use Topics  . Alcohol use: Yes    Alcohol/week: 10.0 standard drinks    Types: 10 Cans of beer per week  . Drug use: No     Colonoscopy:  PAP:  Bone density:  Lipid panel:  Allergies  Allergen Reactions  . Oxycodone-Acetaminophen Nausea Only and Shortness Of  Breath  . Amoxicillin Itching    Hand itching  . Penicillins Rash    Did it involve swelling of the face/tongue/throat, SOB, or low BP? No Did it involve sudden or severe rash/hives, skin peeling, or any reaction on the inside of your mouth or nose? No Did you need to seek medical attention at a hospital or doctor's office? No When did it last happen?4-5 years ago If all above answers are "NO", may proceed with cephalosporin use.     Current Outpatient Medications  Medication Sig Dispense Refill  . ALPRAZolam (XANAX) 0.25 MG tablet Take 0.25 mg by mouth daily as needed for  anxiety.     Marland Kitchen aspirin EC 81 MG tablet Take 81 mg by mouth daily.     Marland Kitchen docusate sodium (COLACE) 100 MG capsule Take 1 capsule (100 mg total) by mouth 2 (two) times daily. (Patient not taking: Reported on 10/14/2019) 60 capsule 0  . doxycycline (VIBRA-TABS) 100 MG tablet Take 1 tablet (100 mg total) by mouth 2 (two) times daily. 14 tablet 0  . gabapentin (NEURONTIN) 300 MG capsule Take 900 mg by mouth 4 (four) times daily.    Marland Kitchen HYDROcodone-acetaminophen (NORCO/VICODIN) 5-325 MG tablet Take 1-2 tablets by mouth every 6 (six) hours as needed for moderate pain. (Patient not taking: Reported on 10/14/2019) 6 tablet 0  . metoprolol succinate (TOPROL-XL) 25 MG 24 hr tablet Take 25 mg by mouth daily.     . tamsulosin (FLOMAX) 0.4 MG CAPS capsule Take 1 capsule (0.4 mg total) by mouth 2 (two) times daily. 180 capsule 0   No current facility-administered medications for this visit.    OBJECTIVE: Vitals:   10/22/19 1343  BP: (!) 148/66  Pulse: 71  Temp: (!) 94.9 F (34.9 C)  SpO2: 93%     Body mass index is 38.65 kg/m.    ECOG FS:0 - Asymptomatic  General: Well-developed, well-nourished, no acute distress. Eyes: Pink conjunctiva, anicteric sclera. HEENT: Normocephalic, moist mucous membranes. Lungs: No audible wheezing or coughing. Heart: Regular rate and rhythm. Abdomen: Soft, nontender, no obvious distention. Musculoskeletal: No edema, cyanosis, or clubbing. Neuro: Alert, answering all questions appropriately. Cranial nerves grossly intact. Skin: Increased erythema and mild tenderness to palpation at site of previous Firmagon injection. Psych: Normal affect.   LAB RESULTS:  Lab Results  Component Value Date   NA 141 10/22/2019   K 4.5 10/22/2019   CL 109 10/22/2019   CO2 25 10/22/2019   GLUCOSE 98 10/22/2019   BUN 21 10/22/2019   CREATININE 0.67 10/22/2019   CALCIUM 8.4 (L) 10/22/2019   GFRNONAA >60 10/22/2019   GFRAA >60 10/22/2019    Lab Results  Component Value Date    WBC 2.1 (L) 10/22/2019   NEUTROABS 1.5 (L) 10/22/2019   HGB 12.9 (L) 10/22/2019   HCT 39.9 10/22/2019   MCV 100.8 (H) 10/22/2019   PLT 47 (L) 10/22/2019     STUDIES: NM PET (AXUMIN) SKULL BASE TO MID THIGH  Result Date: 10/05/2019 CLINICAL DATA:  Prostate carcinoma with biochemical recurrence. Radiation treatment to the prostate gland. EXAM: NUCLEAR MEDICINE PET SKULL BASE TO THIGH TECHNIQUE: 11.0 mCi F-18 Fluciclovine was injected intravenously. Full-ring PET imaging was performed from the skull base to thigh after the radiotracer. CT data was obtained and used for attenuation correction and anatomic localization. COMPARISON:  PET-CT 12/15/2018 FINDINGS: NECK No radiotracer activity in neck lymph nodes. Incidental CT finding: None CHEST No radiotracer accumulation within mediastinal or hilar lymph nodes. No suspicious  pulmonary nodules on the CT scan. Incidental CT finding: 16 mm pulmonary nodule in the RIGHT middle lobe unchanged in size from comparison PET-CT 12/15/2018. This lesion was non significantly metabolic at that time. Lesion currently has trace Axumin accumulation. ABDOMEN/PELVIS Prostate: No focal activity in the prostate gland. Lymph nodes: No enlarged or radiotracer avid pelvic or retroperitoneal lymph nodes. Liver: No evidence of liver metastasis Incidental CT finding: Several large gallstones. The spleen is enlarged to 19 mm. SKELETON Focus of intense radiotracer uptake within the anterior column of the RIGHT acetabulum with SUV max equal 11.3 (image 81 of the fused data set). No clear lesion on the CT portion exam. Focal uptake within the anteromedial LEFT fourth rib with SUV max equal 5.2. Scattered uptake within the spinal to favored benign background. Photopenia in the pelvis related to radiation suppression of the marrow. IMPRESSION: 1. No evidence of local prostate cancer recurrence or nodal metastasis in the abdomen pelvis. 2. Fairly high concern for skeletal metastasis with focal  lesion in the RIGHT acetabulum and anteromedial LEFT rib. Most suspicious lesion is RIGHT acetabular lesion. 3. Diffuse activity in the marrow of the spine is favored benign. 4. Photopenia in the marrow of the pelvis related to radiation marrow suppression. 5. Stable RIGHT middle lobe pulmonary nodule is favored benign. 6. Splenomegaly. Electronically Signed   By: Suzy Bouchard M.D.   On: 10/05/2019 16:03    ASSESSMENT: Stage IVB prostate cancer  PLAN:    1.  Stage IVB prostate cancer: Patient initially received XRT for local control disease in 2016.  More recently his PSA trended up to 7.6.  Axiom PET scan reviewed independently and reported as above with multiple bony lesions noted.  Patient received his first dose of Firmagon on Monday, October 11, 2019.  He will also benefit from [facility] for his bony lesions.  Will not initiate Zytiga or Xtandi at this time given his minimal tumor burden, but can consider this in the future if needed.  Proceed with Xgeva today.  Return to clinic as previously scheduled in 4 weeks for further evaluation and continuation of both [place].  After 3 or 4 injections of Firmagon, will transition patient to [place]. 2.  Pancytopenia: Chronic and unchanged, monitor. 3.  Skin erythema: Although likely a reaction to his Firmagon injection, patient was given a prescription for doxycycline twice a day for 7 days for cellulitis coverage.   Patient expressed understanding and was in agreement with this plan. He also understands that He can call clinic at any time with any questions, concerns, or complaints.   Cancer Staging Prostate cancer [facility]) Staging form: Prostate, AJCC 8th Edition - Clinical: Stage IVB (cTX, cN0, pM1b, PSA: 7.6) - Signed by Lloyd Huger, MD on 10/14/2019   Lloyd Huger, MD   10/22/2019 3:39 PM

## 2019-10-25 ENCOUNTER — Ambulatory Visit: Payer: PPO | Admitting: Oncology

## 2019-11-12 NOTE — Progress Notes (Signed)
[place]  Telephone:(336) [phone] Fax:(336) [phone]  ID: Earl Ramirez OB: 06-27-1941  MR#: [id number]  EVO#:[id number]  Patient Care Team: Baxter Hire, MD as PCP - General (Internal Medicine) Murrell Redden, MD (Urology) Teodoro Spray, MD (Cardiology)  CHIEF COMPLAINT: Stage IVB prostate cancer.  INTERVAL HISTORY: Patient returns to clinic today for further evaluation and continuation of [place].  He currently feels well and is asymptomatic.   He has no neurologic complaints.  He denies any recent fevers or illnesses.  He has a good appetite, and is concerned with slight weight gain over the past several months.  He denies any pain.  He has no chest pain, shortness of breath, cough, or hemoptysis.  He denies any nausea, vomiting, constipation, or diarrhea.  He has no urinary complaints.  Patient offers no further specific complaints today.  REVIEW OF SYSTEMS:   Review of Systems  Constitutional: Negative.  Negative for fever, malaise/fatigue and weight loss.  Respiratory: Negative.  Negative for cough, hemoptysis and shortness of breath.   Cardiovascular: Negative.  Negative for chest pain and leg swelling.  Gastrointestinal: Negative.  Negative for abdominal pain and constipation.  Genitourinary: Negative.  Negative for frequency.  Musculoskeletal: Negative.  Negative for back pain and neck pain.  Skin: Negative.  Negative for rash.  Neurological: Negative.  Negative for dizziness, focal weakness, weakness and headaches.  Psychiatric/Behavioral: Negative.  The patient is not nervous/anxious.     As per HPI. Otherwise, a complete review of systems is negative.  PAST MEDICAL HISTORY: Past Medical History:  Diagnosis Date  . Aortic valve replaced    1998  . Arthritis   . Atrial fibrillation ([place])   . COPD (chronic obstructive pulmonary disease) ([place])   . Dysrhythmia    atrial fib  . Hernia of abdominal wall   . Hypertension   .  Indigestion   . Neuropathy   . Neuropathy   . Prostate cancer ([place])   . Sleep apnea   . Thrombocytopenia ([place])   . Tremors of nervous system   . Urinary problem     PAST SURGICAL HISTORY: Past Surgical History:  Procedure Laterality Date  . CALDWELL LUC    . tongue      growth removed  . TRANSURETHRAL RESECTION OF PROSTATE N/A 01/18/2019   Procedure: CHANNEL TRANSURETHRAL RESECTION OF THE PROSTATE (TURP);  Surgeon: [name], MD;  Location: [facility] ORS;  Service: Urology;  Laterality: N/A;  . VALVE REPLACEMENT      FAMILY HISTORY: Family History  Problem Relation Age of Onset  . Heart attack Father   . Alcohol abuse Father   . Congestive Heart Failure Mother   . Alzheimer's disease Sister     ADVANCED DIRECTIVES (Y/N):  N  HEALTH MAINTENANCE: Social History   Tobacco Use  . Smoking status: Former Smoker    Types: Cigarettes    Quit date: 01/13/2001    Years since quitting: 18.8  . Smokeless tobacco: Former Systems developer    Quit date: 09/24/2000  Substance Use Topics  . Alcohol use: Yes    Alcohol/week: 10.0 standard drinks    Types: 10 Cans of beer per week  . Drug use: No     Colonoscopy:  PAP:  Bone density:  Lipid panel:  Allergies  Allergen Reactions  . Oxycodone-Acetaminophen Nausea Only and Shortness Of Breath  . Amoxicillin Itching    Hand itching  . Penicillins Rash    Did it involve swelling  of the face/tongue/throat, SOB, or low BP? No Did it involve sudden or severe rash/hives, skin peeling, or any reaction on the inside of your mouth or nose? No Did you need to seek medical attention at a hospital or doctor's office? No When did it last happen?4-5 years ago If all above answers are "NO", may proceed with cephalosporin use.     Current Outpatient Medications  Medication Sig Dispense Refill  . ALPRAZolam (XANAX) 0.25 MG tablet Take 0.25 mg by mouth daily as needed for anxiety.     Marland Kitchen aspirin EC 81 MG tablet Take 81 mg by mouth daily.     Marland Kitchen  gabapentin (NEURONTIN) 300 MG capsule Take 900 mg by mouth 4 (four) times daily.    Marland Kitchen HYDROcodone-acetaminophen (NORCO/VICODIN) 5-325 MG tablet Take 1-2 tablets by mouth every 6 (six) hours as needed for moderate pain. 6 tablet 0  . metoprolol succinate (TOPROL-XL) 25 MG 24 hr tablet Take 25 mg by mouth daily.     . tamsulosin (FLOMAX) 0.4 MG CAPS capsule Take 1 capsule (0.4 mg total) by mouth 2 (two) times daily. 180 capsule 0  . docusate sodium (COLACE) 100 MG capsule Take 1 capsule (100 mg total) by mouth 2 (two) times daily. (Patient not taking: Reported on 10/14/2019) 60 capsule 0   No current facility-administered medications for this visit.    OBJECTIVE: Vitals:   11/18/19 1438  BP: 114/63  Pulse: 71  Resp: 20  Temp: (!) 97 F (36.1 C)  SpO2: 96%     Body mass index is 39.99 kg/m.    ECOG FS:0 - Asymptomatic  General: Well-developed, well-nourished, no acute distress. Eyes: Pink conjunctiva, anicteric sclera. HEENT: Normocephalic, moist mucous membranes. Lungs: No audible wheezing or coughing. Heart: Regular rate and rhythm. Abdomen: Soft, nontender, no obvious distention. Musculoskeletal: No edema, cyanosis, or clubbing. Neuro: Alert, answering all questions appropriately. Cranial nerves grossly intact. Skin: No rashes or petechiae noted. Psych: Normal affect.  LAB RESULTS:  Lab Results  Component Value Date   NA 141 11/18/2019   K 4.3 11/18/2019   CL 110 11/18/2019   CO2 25 11/18/2019   GLUCOSE 97 11/18/2019   BUN 18 11/18/2019   CREATININE 0.62 11/18/2019   CALCIUM 7.9 (L) 11/18/2019   GFRNONAA >60 11/18/2019   GFRAA >60 11/18/2019    Lab Results  Component Value Date   WBC 1.3 (LL) 11/18/2019   NEUTROABS 0.8 (L) 11/18/2019   HGB 12.2 (L) 11/18/2019   HCT 37.2 (L) 11/18/2019   MCV 101.6 (H) 11/18/2019   PLT 42 (L) 11/18/2019     STUDIES: No results found.  ASSESSMENT: Stage IVB prostate cancer  PLAN:    1.  Stage IVB prostate cancer: Patient  initially received XRT for local control disease in 2016.  His PSA had also trended up to 7.6, but now is trending down to 6.1.  Today's result is pending.  Axiom PET scan reviewed independently with multiple bony lesions noted.  Patient received his first dose of Firmagon on Monday, October 11, 2019.  He will also benefit from [facility] for his bony lesions.  Will not initiate Zytiga or Xtandi at this time given his minimal tumor burden, but can consider this in the future if needed.  Proceed with [name] today.  Will hold Xgeva secondary to hypocalcemia.  Return to clinic in 4 weeks for further evaluation and continuation of treatment.  After 3 or 4 injections of Firmagon, will transition patient to [place]. 2.  Pancytopenia:  Patient's white blood cell count has trended down to 1.3, but his hemoglobin and platelets are relatively unchanged.  Continue to monitor closely.  Can consider bone marrow biopsy in the future if necessary. 3.  Hypocalcemia: Hold Xgeva as above.  Will include albumin with next blood draw to ensure calcium levels corrected to within normal limits.  Patient expressed understanding and was in agreement with this plan. He also understands that He can call clinic at any time with any questions, concerns, or complaints.   Cancer Staging Prostate cancer [facility]) Staging form: Prostate, AJCC 8th Edition - Clinical: Stage IVB (cTX, cN0, pM1b, PSA: 7.6) - Signed by Lloyd Huger, MD on 10/14/2019   Lloyd Huger, MD   11/18/2019 4:04 PM

## 2019-11-16 ENCOUNTER — Ambulatory Visit: Payer: Self-pay | Admitting: Urology

## 2019-11-17 ENCOUNTER — Encounter: Payer: Self-pay | Admitting: Oncology

## 2019-11-17 NOTE — Progress Notes (Signed)
Patient states over a month ago he received Firmagon injection in his abdomen. He developed a rash which has almost gone away. But patient states the shot left knots in his abdomen at injection site.  He states he is not sure he wants to get [name] again due to the knots and rash. Would like to discuss with provider.

## 2019-11-18 ENCOUNTER — Other Ambulatory Visit: Payer: Self-pay

## 2019-11-18 ENCOUNTER — Inpatient Hospital Stay (HOSPITAL_BASED_OUTPATIENT_CLINIC_OR_DEPARTMENT_OTHER): Payer: PPO | Admitting: Oncology

## 2019-11-18 ENCOUNTER — Inpatient Hospital Stay: Payer: PPO | Attending: Oncology

## 2019-11-18 ENCOUNTER — Encounter: Payer: Self-pay | Admitting: Oncology

## 2019-11-18 ENCOUNTER — Inpatient Hospital Stay: Payer: PPO

## 2019-11-18 VITALS — BP 114/63 | HR 71 | Temp 97.0°F | Resp 20 | Wt 255.3 lb

## 2019-11-18 DIAGNOSIS — C61 Malignant neoplasm of prostate: Secondary | ICD-10-CM | POA: Insufficient documentation

## 2019-11-18 DIAGNOSIS — M1990 Unspecified osteoarthritis, unspecified site: Secondary | ICD-10-CM | POA: Insufficient documentation

## 2019-11-18 DIAGNOSIS — G4730 Sleep apnea, unspecified: Secondary | ICD-10-CM | POA: Diagnosis not present

## 2019-11-18 DIAGNOSIS — I4891 Unspecified atrial fibrillation: Secondary | ICD-10-CM | POA: Diagnosis not present

## 2019-11-18 DIAGNOSIS — J449 Chronic obstructive pulmonary disease, unspecified: Secondary | ICD-10-CM | POA: Insufficient documentation

## 2019-11-18 DIAGNOSIS — I10 Essential (primary) hypertension: Secondary | ICD-10-CM | POA: Insufficient documentation

## 2019-11-18 DIAGNOSIS — Z79899 Other long term (current) drug therapy: Secondary | ICD-10-CM | POA: Insufficient documentation

## 2019-11-18 DIAGNOSIS — D61818 Other pancytopenia: Secondary | ICD-10-CM | POA: Insufficient documentation

## 2019-11-18 DIAGNOSIS — Z87891 Personal history of nicotine dependence: Secondary | ICD-10-CM | POA: Diagnosis not present

## 2019-11-18 DIAGNOSIS — D696 Thrombocytopenia, unspecified: Secondary | ICD-10-CM | POA: Insufficient documentation

## 2019-11-18 DIAGNOSIS — Z5111 Encounter for antineoplastic chemotherapy: Secondary | ICD-10-CM | POA: Insufficient documentation

## 2019-11-18 DIAGNOSIS — G629 Polyneuropathy, unspecified: Secondary | ICD-10-CM | POA: Insufficient documentation

## 2019-11-18 DIAGNOSIS — Z7982 Long term (current) use of aspirin: Secondary | ICD-10-CM | POA: Insufficient documentation

## 2019-11-18 LAB — CBC WITH DIFFERENTIAL/PLATELET
Abs Immature Granulocytes: 0 10*3/uL (ref 0.00–0.07)
Basophils Absolute: 0 10*3/uL (ref 0.0–0.1)
Basophils Relative: 0 %
Eosinophils Absolute: 0 10*3/uL (ref 0.0–0.5)
Eosinophils Relative: 2 %
HCT: 37.2 % — ABNORMAL LOW (ref 39.0–52.0)
Hemoglobin: 12.2 g/dL — ABNORMAL LOW (ref 13.0–17.0)
Immature Granulocytes: 0 %
Lymphocytes Relative: 29 %
Lymphs Abs: 0.4 10*3/uL — ABNORMAL LOW (ref 0.7–4.0)
MCH: 33.3 pg (ref 26.0–34.0)
MCHC: 32.8 g/dL (ref 30.0–36.0)
MCV: 101.6 fL — ABNORMAL HIGH (ref 80.0–100.0)
Monocytes Absolute: 0.1 10*3/uL (ref 0.1–1.0)
Monocytes Relative: 10 %
Neutro Abs: 0.8 10*3/uL — ABNORMAL LOW (ref 1.7–7.7)
Neutrophils Relative %: 59 %
Platelets: 42 10*3/uL — ABNORMAL LOW (ref 150–400)
RBC: 3.66 MIL/uL — ABNORMAL LOW (ref 4.22–5.81)
RDW: 14.2 % (ref 11.5–15.5)
WBC: 1.3 10*3/uL — CL (ref 4.0–10.5)
nRBC: 0 % (ref 0.0–0.2)

## 2019-11-18 LAB — BASIC METABOLIC PANEL
Anion gap: 6 (ref 5–15)
BUN: 18 mg/dL (ref 8–23)
CO2: 25 mmol/L (ref 22–32)
Calcium: 7.9 mg/dL — ABNORMAL LOW (ref 8.9–10.3)
Chloride: 110 mmol/L (ref 98–111)
Creatinine, Ser: 0.62 mg/dL (ref 0.61–1.24)
GFR calc Af Amer: >60 mL/min (ref >60)
GFR calc non Af Amer: >60 mL/min (ref >60)
Glucose, Bld: 97 mg/dL (ref 70–99)
Potassium: 4.3 mmol/L (ref 3.5–5.1)
Sodium: 141 mmol/L (ref 135–145)

## 2019-11-18 LAB — PSA: Prostatic Specific Antigen: 5.6 ng/mL — ABNORMAL HIGH (ref 0.00–4.00)

## 2019-11-18 MED ORDER — DEGARELIX ACETATE 80 MG ~~LOC~~ SOLR
80.0000 mg | Freq: Once | SUBCUTANEOUS | Status: AC
  Administered 2019-11-18: 80 mg via SUBCUTANEOUS
  Filled 2019-11-18: qty 4

## 2019-11-18 MED ORDER — DENOSUMAB 120 MG/1.7ML ~~LOC~~ SOLN: 120.0000 mg | Freq: Once | SUBCUTANEOUS | Status: DC

## 2019-12-09 ENCOUNTER — Other Ambulatory Visit: Payer: Self-pay | Admitting: Urology

## 2019-12-09 DIAGNOSIS — N401 Enlarged prostate with lower urinary tract symptoms: Secondary | ICD-10-CM

## 2019-12-17 DIAGNOSIS — G609 Hereditary and idiopathic neuropathy, unspecified: Secondary | ICD-10-CM | POA: Insufficient documentation

## 2019-12-17 DIAGNOSIS — J449 Chronic obstructive pulmonary disease, unspecified: Secondary | ICD-10-CM | POA: Insufficient documentation

## 2019-12-17 DIAGNOSIS — R161 Splenomegaly, not elsewhere classified: Secondary | ICD-10-CM | POA: Insufficient documentation

## 2019-12-17 DIAGNOSIS — G4730 Sleep apnea, unspecified: Secondary | ICD-10-CM | POA: Insufficient documentation

## 2019-12-19 NOTE — Progress Notes (Signed)
Earl Ramirez  Telephone:(336) [phone] Fax:(336) [phone]  ID: [name] OB: 01-Aug-1941  MR#: [id number]  GBT#:[id number]  Patient Care Team: Baxter Hire, MD as PCP - General (Internal Medicine) Murrell Redden, MD (Urology) [name] Javier Docker, MD (Cardiology) Lloyd Huger, MD as Consulting Physician (Oncology)  CHIEF COMPLAINT: Stage IVB prostate cancer.  INTERVAL HISTORY: Patient returns to clinic today for further evaluation and continuation of [place].  He continues to feel well and remains asymptomatic.  He has no neurologic complaints.  He denies any recent fevers or illnesses.  He has a good appetite and denies weight loss. He denies any pain.  He has no chest pain, shortness of breath, cough, or hemoptysis.  He denies any nausea, vomiting, constipation, or diarrhea.  He has no urinary complaints.  Patient feels that his baseline offers no specific complaints today.  REVIEW OF SYSTEMS:   Review of Systems  Constitutional: Negative.  Negative for fever, malaise/fatigue and weight loss.  Respiratory: Negative.  Negative for cough, hemoptysis and shortness of breath.   Cardiovascular: Negative.  Negative for chest pain and leg swelling.  Gastrointestinal: Negative.  Negative for abdominal pain and constipation.  Genitourinary: Negative.  Negative for frequency.  Musculoskeletal: Negative.  Negative for back pain and neck pain.  Skin: Negative.  Negative for rash.  Neurological: Negative.  Negative for dizziness, focal weakness, weakness and headaches.  Psychiatric/Behavioral: Negative.  The patient is not nervous/anxious.     As per HPI. Otherwise, a complete review of systems is negative.  PAST MEDICAL HISTORY: Past Medical History:  Diagnosis Date  . Aortic valve replaced    1998  . Arthritis   . Atrial fibrillation ([place])   . COPD (chronic obstructive pulmonary disease) ([place])   . Dysrhythmia    atrial fib  . Hernia of abdominal  wall   . Hypertension   . Indigestion   . Neuropathy   . Neuropathy   . Prostate cancer ([place])   . Sleep apnea   . Thrombocytopenia ([place])   . Tremors of nervous system   . Urinary problem     PAST SURGICAL HISTORY: Past Surgical History:  Procedure Laterality Date  . CALDWELL LUC    . tongue      growth removed  . TRANSURETHRAL RESECTION OF PROSTATE N/A 01/18/2019   Procedure: CHANNEL TRANSURETHRAL RESECTION OF THE PROSTATE (TURP);  Surgeon: [name], MD;  Location: [facility] ORS;  Service: Urology;  Laterality: N/A;  . VALVE REPLACEMENT      FAMILY HISTORY: Family History  Problem Relation Age of Onset  . Heart attack Father   . Alcohol abuse Father   . Congestive Heart Failure Mother   . Alzheimer's disease Sister     ADVANCED DIRECTIVES (Y/N):  N  HEALTH MAINTENANCE: Social History   Tobacco Use  . Smoking status: Former Smoker    Types: Cigarettes    Quit date: 01/13/2001    Years since quitting: 18.9  . Smokeless tobacco: Former Systems developer    Quit date: 09/24/2000  Substance Use Topics  . Alcohol use: Yes    Alcohol/week: 10.0 standard drinks    Types: 10 Cans of beer per week  . Drug use: No     Colonoscopy:  PAP:  Bone density:  Lipid panel:  Allergies  Allergen Reactions  . Oxycodone-Acetaminophen Nausea Only and Shortness Of Breath  . Amoxicillin Itching    Hand itching  . Penicillins Rash    Did it  involve swelling of the face/tongue/throat, SOB, or low BP? No Did it involve sudden or severe rash/hives, skin peeling, or any reaction on the inside of your mouth or nose? No Did you need to seek medical attention at a hospital or doctor's office? No When did it last happen?4-5 years ago If all above answers are "NO", may proceed with cephalosporin use.     Current Outpatient Medications  Medication Sig Dispense Refill  . ALPRAZolam (XANAX) 0.25 MG tablet Take 0.25 mg by mouth daily as needed for anxiety.     Marland Kitchen aspirin EC 81 MG tablet Take  81 mg by mouth daily.     Marland Kitchen docusate sodium (COLACE) 100 MG capsule Take 1 capsule (100 mg total) by mouth 2 (two) times daily. 60 capsule 0  . gabapentin (NEURONTIN) 300 MG capsule Take 900 mg by mouth 4 (four) times daily.    Marland Kitchen HYDROcodone-acetaminophen (NORCO/VICODIN) 5-325 MG tablet Take 1-2 tablets by mouth every 6 (six) hours as needed for moderate pain. 6 tablet 0  . metoprolol succinate (TOPROL-XL) 25 MG 24 hr tablet Take 25 mg by mouth daily.     . tamsulosin (FLOMAX) 0.4 MG CAPS capsule Take 1 capsule by mouth twice daily 90 capsule 3   No current facility-administered medications for this visit.    OBJECTIVE: Vitals:   12/20/19 1425  BP: 136/62  Pulse: 75  Resp: 17  Temp: (!) 97.1 F (36.2 C)  SpO2: 97%     Body mass index is 38.44 kg/m.    ECOG FS:0 - Asymptomatic  General: Well-developed, well-nourished, no acute distress. Eyes: Pink conjunctiva, anicteric sclera. HEENT: Normocephalic, moist mucous membranes. Lungs: No audible wheezing or coughing. Heart: Regular rate and rhythm. Abdomen: Soft, nontender, no obvious distention. Musculoskeletal: No edema, cyanosis, or clubbing. Neuro: Alert, answering all questions appropriately. Cranial nerves grossly intact. Skin: No rashes or petechiae noted. Psych: Normal affect.  LAB RESULTS:  Lab Results  Component Value Date   NA 140 12/20/2019   K 4.4 12/20/2019   CL 110 12/20/2019   CO2 24 12/20/2019   GLUCOSE 107 (H) 12/20/2019   BUN 18 12/20/2019   CREATININE 0.67 12/20/2019   CALCIUM 8.3 (L) 12/20/2019   PROT 6.3 (L) 12/20/2019   ALBUMIN 3.8 12/20/2019   AST 36 12/20/2019   ALT 31 12/20/2019   ALKPHOS 63 12/20/2019   BILITOT 1.4 (H) 12/20/2019   GFRNONAA >60 12/20/2019   GFRAA >60 12/20/2019    Lab Results  Component Value Date   WBC 2.0 (L) 12/20/2019   NEUTROABS 1.2 (L) 12/20/2019   HGB 13.1 12/20/2019   HCT 39.0 12/20/2019   MCV 100.0 12/20/2019   PLT 42 (L) 12/20/2019     STUDIES: No  results found.  ASSESSMENT: Stage IVB prostate cancer  PLAN:    1.  Stage IVB prostate cancer: Patient initially received XRT for local control disease in 2016.  His PSA had also trended up to 7.6, but is now trending down and his most recent result was 5.6, today's result is pending.  Axiom PET scan on October 05, 2019 reviewed independently with multiple bony lesions noted.  Patient received his first dose of Firmagon on Monday, October 11, 2019.  He will also benefit from [facility] for his bony lesions.  Will not initiate Zytiga or Xtandi at this time given his minimal tumor burden, but can consider this in the future if needed.  Proceed with [name] and [place] See today.  Return to clinic in 4  weeks for further evaluation, repeat laboratory work, and continuation of treatment.  After 3 or 4 injections of Firmagon, will transition patient to [place]. 2.  Pancytopenia: Chronic and unchanged.  Continue to monitor closely.  Can consider bone marrow biopsy in the future if necessary. 3.  Hypocalcemia: Improved.  Proceed with Xgeva as above.  I spent a total of 30 minutes reviewing chart data, face-to-face evaluation with the patient, counseling and coordination of care as detailed above.   Patient expressed understanding and was in agreement with this plan. He also understands that He can call clinic at any time with any questions, concerns, or complaints.   Cancer Staging Prostate cancer [facility]) Staging form: Prostate, AJCC 8th Edition - Clinical: Stage IVB (cTX, cN0, pM1b, PSA: 7.6) - Signed by Lloyd Huger, MD on 10/14/2019   Lloyd Huger, MD   12/20/2019 4:56 PM

## 2019-12-20 ENCOUNTER — Inpatient Hospital Stay: Payer: PPO

## 2019-12-20 ENCOUNTER — Other Ambulatory Visit: Payer: Self-pay

## 2019-12-20 ENCOUNTER — Inpatient Hospital Stay (HOSPITAL_BASED_OUTPATIENT_CLINIC_OR_DEPARTMENT_OTHER): Payer: PPO | Admitting: Oncology

## 2019-12-20 ENCOUNTER — Encounter: Payer: Self-pay | Admitting: Oncology

## 2019-12-20 ENCOUNTER — Inpatient Hospital Stay: Payer: PPO | Attending: Oncology

## 2019-12-20 VITALS — BP 136/62 | HR 75 | Temp 97.1°F | Resp 17 | Wt 245.4 lb

## 2019-12-20 DIAGNOSIS — Z87891 Personal history of nicotine dependence: Secondary | ICD-10-CM | POA: Insufficient documentation

## 2019-12-20 DIAGNOSIS — D61818 Other pancytopenia: Secondary | ICD-10-CM | POA: Diagnosis not present

## 2019-12-20 DIAGNOSIS — Z5111 Encounter for antineoplastic chemotherapy: Secondary | ICD-10-CM | POA: Insufficient documentation

## 2019-12-20 DIAGNOSIS — C61 Malignant neoplasm of prostate: Secondary | ICD-10-CM

## 2019-12-20 LAB — COMPREHENSIVE METABOLIC PANEL
ALT: 31 U/L (ref 0–44)
AST: 36 U/L (ref 15–41)
Albumin: 3.8 g/dL (ref 3.5–5.0)
Alkaline Phosphatase: 63 U/L (ref 38–126)
Anion gap: 6 (ref 5–15)
BUN: 18 mg/dL (ref 8–23)
CO2: 24 mmol/L (ref 22–32)
Calcium: 8.3 mg/dL — ABNORMAL LOW (ref 8.9–10.3)
Chloride: 110 mmol/L (ref 98–111)
Creatinine, Ser: 0.67 mg/dL (ref 0.61–1.24)
GFR calc Af Amer: >60 mL/min (ref >60)
GFR calc non Af Amer: >60 mL/min (ref >60)
Glucose, Bld: 107 mg/dL — ABNORMAL HIGH (ref 70–99)
Potassium: 4.4 mmol/L (ref 3.5–5.1)
Sodium: 140 mmol/L (ref 135–145)
Total Bilirubin: 1.4 mg/dL — ABNORMAL HIGH (ref 0.3–1.2)
Total Protein: 6.3 g/dL — ABNORMAL LOW (ref 6.5–8.1)

## 2019-12-20 LAB — CBC WITH DIFFERENTIAL/PLATELET
Abs Immature Granulocytes: 0.01 10*3/uL (ref 0.00–0.07)
Basophils Absolute: 0 10*3/uL (ref 0.0–0.1)
Basophils Relative: 1 %
Eosinophils Absolute: 0 10*3/uL (ref 0.0–0.5)
Eosinophils Relative: 2 %
HCT: 39 % (ref 39.0–52.0)
Hemoglobin: 13.1 g/dL (ref 13.0–17.0)
Immature Granulocytes: 1 %
Lymphocytes Relative: 23 %
Lymphs Abs: 0.4 10*3/uL — ABNORMAL LOW (ref 0.7–4.0)
MCH: 33.6 pg (ref 26.0–34.0)
MCHC: 33.6 g/dL (ref 30.0–36.0)
MCV: 100 fL (ref 80.0–100.0)
Monocytes Absolute: 0.2 10*3/uL (ref 0.1–1.0)
Monocytes Relative: 11 %
Neutro Abs: 1.2 10*3/uL — ABNORMAL LOW (ref 1.7–7.7)
Neutrophils Relative %: 62 %
Platelets: 42 10*3/uL — ABNORMAL LOW (ref 150–400)
RBC: 3.9 MIL/uL — ABNORMAL LOW (ref 4.22–5.81)
RDW: 14.5 % (ref 11.5–15.5)
WBC: 2 10*3/uL — ABNORMAL LOW (ref 4.0–10.5)
nRBC: 0 % (ref 0.0–0.2)

## 2019-12-20 LAB — PSA: Prostatic Specific Antigen: 5.8 ng/mL — ABNORMAL HIGH (ref 0.00–4.00)

## 2019-12-20 MED ORDER — DENOSUMAB 120 MG/1.7ML ~~LOC~~ SOLN
120.0000 mg | Freq: Once | SUBCUTANEOUS | Status: AC
  Administered 2019-12-20: 120 mg via SUBCUTANEOUS
  Filled 2019-12-20: qty 1.7

## 2019-12-20 MED ORDER — DEGARELIX ACETATE 80 MG ~~LOC~~ SOLR
80.0000 mg | Freq: Once | SUBCUTANEOUS | Status: AC
  Administered 2019-12-20: 80 mg via SUBCUTANEOUS
  Filled 2019-12-20: qty 4

## 2019-12-20 NOTE — Progress Notes (Signed)
Pt here for follow up. No questions or concerns.  

## 2020-01-18 NOTE — Progress Notes (Signed)
[place]  Telephone:(336) [phone] Fax:(336) [phone]  ID: Earl Ramirez OB: Jun 29, 1941  MR#: [id number]  IEP#:[id number]  Patient Care Team: Baxter Hire, MD as PCP - General (Internal Medicine) Murrell Redden, MD (Urology) [name] Javier Docker, MD (Cardiology) Lloyd Huger, MD as Consulting Physician (Oncology)  CHIEF COMPLAINT: Stage IVB prostate cancer.  INTERVAL HISTORY: Patient returns to clinic today for further evaluation and continuation of [place].  He continues to have problems with irritation at his injection site, but otherwise is tolerating his treatments well.  He has no neurologic complaints.  He denies any recent fevers or illnesses.  He has a good appetite and denies weight loss. He denies any pain.  He has no chest pain, shortness of breath, cough, or hemoptysis.  He denies any nausea, vomiting, constipation, or diarrhea.  He has no urinary complaints.  Patient offers no further specific complaints today.  REVIEW OF SYSTEMS:   Review of Systems  Constitutional: Negative.  Negative for fever, malaise/fatigue and weight loss.  Respiratory: Negative.  Negative for cough, hemoptysis and shortness of breath.   Cardiovascular: Negative.  Negative for chest pain and leg swelling.  Gastrointestinal: Negative.  Negative for abdominal pain and constipation.  Genitourinary: Negative.  Negative for frequency.  Musculoskeletal: Negative.  Negative for back pain and neck pain.  Skin: Negative.  Negative for rash.  Neurological: Negative.  Negative for dizziness, focal weakness, weakness and headaches.  Psychiatric/Behavioral: Negative.  The patient is not nervous/anxious.     As per HPI. Otherwise, a complete review of systems is negative.  PAST MEDICAL HISTORY: Past Medical History:  Diagnosis Date  . Aortic valve replaced    1998  . Arthritis   . Atrial fibrillation ([place])   . COPD (chronic obstructive pulmonary disease) ([place])   .  Dysrhythmia    atrial fib  . Hernia of abdominal wall   . Hypertension   . Indigestion   . Neuropathy   . Neuropathy   . Prostate cancer ([place])   . Sleep apnea   . Thrombocytopenia ([place])   . Tremors of nervous system   . Urinary problem     PAST SURGICAL HISTORY: Past Surgical History:  Procedure Laterality Date  . CALDWELL LUC    . tongue      growth removed  . TRANSURETHRAL RESECTION OF PROSTATE N/A 01/18/2019   Procedure: CHANNEL TRANSURETHRAL RESECTION OF THE PROSTATE (TURP);  Surgeon: [name], MD;  Location: [facility] ORS;  Service: Urology;  Laterality: N/A;  . VALVE REPLACEMENT      FAMILY HISTORY: Family History  Problem Relation Age of Onset  . Heart attack Father   . Alcohol abuse Father   . Congestive Heart Failure Mother   . Alzheimer's disease Sister     ADVANCED DIRECTIVES (Y/N):  N  HEALTH MAINTENANCE: Social History   Tobacco Use  . Smoking status: Former Smoker    Types: Cigarettes    Quit date: 01/13/2001    Years since quitting: 19.0  . Smokeless tobacco: Former Systems developer    Quit date: 09/24/2000  Vaping Use  . Vaping Use: Never used  Substance Use Topics  . Alcohol use: Yes    Alcohol/week: 10.0 standard drinks    Types: 10 Cans of beer per week  . Drug use: No     Colonoscopy:  PAP:  Bone density:  Lipid panel:  Allergies  Allergen Reactions  . Oxycodone-Acetaminophen Nausea Only and Shortness Of Breath  .  Amoxicillin Itching    Hand itching  . Penicillins Rash    Did it involve swelling of the face/tongue/throat, SOB, or low BP? No Did it involve sudden or severe rash/hives, skin peeling, or any reaction on the inside of your mouth or nose? No Did you need to seek medical attention at a hospital or doctor's office? No When did it last happen?4-5 years ago If all above answers are "NO", may proceed with cephalosporin use.     Current Outpatient Medications  Medication Sig Dispense Refill  . ALPRAZolam (XANAX) 0.25 MG  tablet Take 0.25 mg by mouth daily as needed for anxiety.     Marland Kitchen aspirin EC 81 MG tablet Take 81 mg by mouth daily.     Marland Kitchen docusate sodium (COLACE) 100 MG capsule Take 1 capsule (100 mg total) by mouth 2 (two) times daily. 60 capsule 0  . gabapentin (NEURONTIN) 300 MG capsule Take 900 mg by mouth 4 (four) times daily.    Marland Kitchen HYDROcodone-acetaminophen (NORCO/VICODIN) 5-325 MG tablet Take 1-2 tablets by mouth every 6 (six) hours as needed for moderate pain. 6 tablet 0  . metoprolol succinate (TOPROL-XL) 25 MG 24 hr tablet Take 25 mg by mouth daily.     . tamsulosin (FLOMAX) 0.4 MG CAPS capsule Take 1 capsule by mouth twice daily 90 capsule 3   No current facility-administered medications for this visit.    OBJECTIVE: Vitals:   01/21/20 1203  BP: (!) 146/77  Pulse: 93  Temp: 98.8 F (37.1 C)  SpO2: 100%     Body mass index is 38.87 kg/m.    ECOG FS:0 - Asymptomatic  General: Well-developed, well-nourished, no acute distress. Eyes: Pink conjunctiva, anicteric sclera. HEENT: Normocephalic, moist mucous membranes. Lungs: No audible wheezing or coughing. Heart: Regular rate and rhythm. Abdomen: Soft, nontender, no obvious distention. Musculoskeletal: No edema, cyanosis, or clubbing. Neuro: Alert, answering all questions appropriately. Cranial nerves grossly intact. Skin: No rashes or petechiae noted. Psych: Normal affect.   LAB RESULTS:  Lab Results  Component Value Date   NA 141 01/21/2020   K 4.4 01/21/2020   CL 106 01/21/2020   CO2 26 01/21/2020   GLUCOSE 104 (H) 01/21/2020   BUN 14 01/21/2020   CREATININE 0.64 01/21/2020   CALCIUM 8.2 (L) 01/21/2020   PROT 6.3 (L) 12/20/2019   ALBUMIN 3.8 12/20/2019   AST 36 12/20/2019   ALT 31 12/20/2019   ALKPHOS 63 12/20/2019   BILITOT 1.4 (H) 12/20/2019   GFRNONAA >60 01/21/2020   GFRAA >60 01/21/2020    Lab Results  Component Value Date   WBC 1.7 (L) 01/21/2020   NEUTROABS 1.1 (L) 01/21/2020   HGB 12.7 (L) 01/21/2020   HCT  38.0 (L) 01/21/2020   MCV 99.5 01/21/2020   PLT 38 (L) 01/21/2020     STUDIES: No results found.  ASSESSMENT: Stage IVB prostate cancer  PLAN:    1.  Stage IVB prostate cancer: Patient initially received XRT for local control disease in 2016.  His PSA had also trended up to 7.6.  Most recent PSA has trended down slightly to 5.37.  Axiom PET scan on October 05, 2019 reviewed independently with multiple bony lesions noted.  Patient received his first dose of Firmagon on Monday, October 11, 2019.  He will also benefit from [facility] for his bony lesions.  Will not initiate Zytiga or Xtandi at this time given his minimal tumor burden, but can consider this in the future if needed.  Proceed with  [name] and [place] today.  Return to clinic in 4 weeks for further evaluation and continuation of treatment.  At which time we will discontinue Firmagon and initiate Eligard every 6 months.   2.  Pancytopenia: Chronic and unchanged.  Continue to monitor closely.  Can consider bone marrow biopsy in the future if necessary. 3.  Hypocalcemia: Improved.  Proceed with Xgeva as above.  I spent a total of 30 minutes reviewing chart data, face-to-face evaluation with the patient, counseling and coordination of care as detailed above.    Patient expressed understanding and was in agreement with this plan. He also understands that He can call clinic at any time with any questions, concerns, or complaints.   Cancer Staging Prostate cancer [facility]) Staging form: Prostate, AJCC 8th Edition - Clinical: Stage IVB (cTX, cN0, pM1b, PSA: 7.6) - Signed by Lloyd Huger, MD on 10/14/2019   Lloyd Huger, MD   01/23/2020 8:42 AM

## 2020-01-21 ENCOUNTER — Inpatient Hospital Stay (HOSPITAL_BASED_OUTPATIENT_CLINIC_OR_DEPARTMENT_OTHER): Payer: PPO | Admitting: Oncology

## 2020-01-21 ENCOUNTER — Encounter: Payer: Self-pay | Admitting: Oncology

## 2020-01-21 ENCOUNTER — Other Ambulatory Visit: Payer: Self-pay

## 2020-01-21 ENCOUNTER — Inpatient Hospital Stay: Payer: PPO | Attending: Oncology

## 2020-01-21 ENCOUNTER — Inpatient Hospital Stay: Payer: PPO

## 2020-01-21 VITALS — BP 146/77 | HR 93 | Temp 98.8°F | Wt 248.2 lb

## 2020-01-21 DIAGNOSIS — J449 Chronic obstructive pulmonary disease, unspecified: Secondary | ICD-10-CM | POA: Diagnosis not present

## 2020-01-21 DIAGNOSIS — I4891 Unspecified atrial fibrillation: Secondary | ICD-10-CM | POA: Insufficient documentation

## 2020-01-21 DIAGNOSIS — C7951 Secondary malignant neoplasm of bone: Secondary | ICD-10-CM | POA: Insufficient documentation

## 2020-01-21 DIAGNOSIS — Z79899 Other long term (current) drug therapy: Secondary | ICD-10-CM | POA: Diagnosis not present

## 2020-01-21 DIAGNOSIS — Z952 Presence of prosthetic heart valve: Secondary | ICD-10-CM | POA: Diagnosis not present

## 2020-01-21 DIAGNOSIS — M129 Arthropathy, unspecified: Secondary | ICD-10-CM | POA: Diagnosis not present

## 2020-01-21 DIAGNOSIS — Z7982 Long term (current) use of aspirin: Secondary | ICD-10-CM | POA: Insufficient documentation

## 2020-01-21 DIAGNOSIS — D696 Thrombocytopenia, unspecified: Secondary | ICD-10-CM | POA: Diagnosis not present

## 2020-01-21 DIAGNOSIS — C61 Malignant neoplasm of prostate: Secondary | ICD-10-CM

## 2020-01-21 DIAGNOSIS — I10 Essential (primary) hypertension: Secondary | ICD-10-CM | POA: Diagnosis not present

## 2020-01-21 DIAGNOSIS — G629 Polyneuropathy, unspecified: Secondary | ICD-10-CM | POA: Diagnosis not present

## 2020-01-21 DIAGNOSIS — Z87891 Personal history of nicotine dependence: Secondary | ICD-10-CM | POA: Insufficient documentation

## 2020-01-21 DIAGNOSIS — D61818 Other pancytopenia: Secondary | ICD-10-CM | POA: Insufficient documentation

## 2020-01-21 DIAGNOSIS — G4730 Sleep apnea, unspecified: Secondary | ICD-10-CM | POA: Insufficient documentation

## 2020-01-21 LAB — CBC WITH DIFFERENTIAL/PLATELET
Abs Immature Granulocytes: 0.01 10*3/uL (ref 0.00–0.07)
Basophils Absolute: 0 10*3/uL (ref 0.0–0.1)
Basophils Relative: 1 %
Eosinophils Absolute: 0 10*3/uL (ref 0.0–0.5)
Eosinophils Relative: 2 %
HCT: 38 % — ABNORMAL LOW (ref 39.0–52.0)
Hemoglobin: 12.7 g/dL — ABNORMAL LOW (ref 13.0–17.0)
Immature Granulocytes: 1 %
Lymphocytes Relative: 23 %
Lymphs Abs: 0.4 10*3/uL — ABNORMAL LOW (ref 0.7–4.0)
MCH: 33.2 pg (ref 26.0–34.0)
MCHC: 33.4 g/dL (ref 30.0–36.0)
MCV: 99.5 fL (ref 80.0–100.0)
Monocytes Absolute: 0.2 10*3/uL (ref 0.1–1.0)
Monocytes Relative: 10 %
Neutro Abs: 1.1 10*3/uL — ABNORMAL LOW (ref 1.7–7.7)
Neutrophils Relative %: 63 %
Platelets: 38 10*3/uL — ABNORMAL LOW (ref 150–400)
RBC: 3.82 MIL/uL — ABNORMAL LOW (ref 4.22–5.81)
RDW: 14.2 % (ref 11.5–15.5)
WBC: 1.7 10*3/uL — ABNORMAL LOW (ref 4.0–10.5)
nRBC: 0 % (ref 0.0–0.2)

## 2020-01-21 LAB — BASIC METABOLIC PANEL
Anion gap: 9 (ref 5–15)
BUN: 14 mg/dL (ref 8–23)
CO2: 26 mmol/L (ref 22–32)
Calcium: 8.2 mg/dL — ABNORMAL LOW (ref 8.9–10.3)
Chloride: 106 mmol/L (ref 98–111)
Creatinine, Ser: 0.64 mg/dL (ref 0.61–1.24)
GFR calc Af Amer: >60 mL/min (ref >60)
GFR calc non Af Amer: >60 mL/min (ref >60)
Glucose, Bld: 104 mg/dL — ABNORMAL HIGH (ref 70–99)
Potassium: 4.4 mmol/L (ref 3.5–5.1)
Sodium: 141 mmol/L (ref 135–145)

## 2020-01-21 LAB — PSA: Prostatic Specific Antigen: 5.37 ng/mL — ABNORMAL HIGH (ref 0.00–4.00)

## 2020-01-21 MED ORDER — DENOSUMAB 120 MG/1.7ML ~~LOC~~ SOLN
120.0000 mg | Freq: Once | SUBCUTANEOUS | Status: AC
  Administered 2020-01-21: 120 mg via SUBCUTANEOUS
  Filled 2020-01-21: qty 1.7

## 2020-01-21 MED ORDER — DEGARELIX ACETATE 80 MG ~~LOC~~ SOLR
80.0000 mg | Freq: Once | SUBCUTANEOUS | Status: AC
  Administered 2020-01-21: 80 mg via SUBCUTANEOUS
  Filled 2020-01-21: qty 4

## 2020-01-21 NOTE — Progress Notes (Signed)
Ca =8.2, asked MD if wanted to hold xgeva.  Wants to go ahead and give xgeva today.

## 2020-02-17 ENCOUNTER — Encounter: Payer: Self-pay | Admitting: Nurse Practitioner

## 2020-02-17 NOTE — Progress Notes (Signed)
Patient prescreened for visit, reports having difficulty making what he feels are nutritious meals. Referral for dietician placed. Also reports that brown, circular area has been on his nose for about 4-5 months and he would like someone to look at it.

## 2020-02-18 ENCOUNTER — Inpatient Hospital Stay: Payer: PPO

## 2020-02-18 ENCOUNTER — Other Ambulatory Visit: Payer: Self-pay

## 2020-02-18 ENCOUNTER — Inpatient Hospital Stay: Payer: PPO | Attending: Nurse Practitioner | Admitting: Nurse Practitioner

## 2020-02-18 VITALS — BP 139/82 | HR 75 | Temp 97.9°F | Resp 20 | Wt 242.5 lb

## 2020-02-18 DIAGNOSIS — M129 Arthropathy, unspecified: Secondary | ICD-10-CM | POA: Diagnosis not present

## 2020-02-18 DIAGNOSIS — Z79899 Other long term (current) drug therapy: Secondary | ICD-10-CM | POA: Diagnosis not present

## 2020-02-18 DIAGNOSIS — C61 Malignant neoplasm of prostate: Secondary | ICD-10-CM | POA: Diagnosis not present

## 2020-02-18 DIAGNOSIS — I4891 Unspecified atrial fibrillation: Secondary | ICD-10-CM | POA: Insufficient documentation

## 2020-02-18 DIAGNOSIS — Z13220 Encounter for screening for lipoid disorders: Secondary | ICD-10-CM | POA: Diagnosis not present

## 2020-02-18 DIAGNOSIS — Z87891 Personal history of nicotine dependence: Secondary | ICD-10-CM | POA: Insufficient documentation

## 2020-02-18 DIAGNOSIS — Z7982 Long term (current) use of aspirin: Secondary | ICD-10-CM | POA: Diagnosis not present

## 2020-02-18 DIAGNOSIS — G629 Polyneuropathy, unspecified: Secondary | ICD-10-CM | POA: Diagnosis not present

## 2020-02-18 DIAGNOSIS — O019 Hydatidiform mole, unspecified: Secondary | ICD-10-CM

## 2020-02-18 DIAGNOSIS — L989 Disorder of the skin and subcutaneous tissue, unspecified: Secondary | ICD-10-CM | POA: Insufficient documentation

## 2020-02-18 DIAGNOSIS — R634 Abnormal weight loss: Secondary | ICD-10-CM | POA: Diagnosis not present

## 2020-02-18 DIAGNOSIS — D61818 Other pancytopenia: Secondary | ICD-10-CM | POA: Diagnosis not present

## 2020-02-18 DIAGNOSIS — J449 Chronic obstructive pulmonary disease, unspecified: Secondary | ICD-10-CM | POA: Insufficient documentation

## 2020-02-18 DIAGNOSIS — I10 Essential (primary) hypertension: Secondary | ICD-10-CM | POA: Insufficient documentation

## 2020-02-18 DIAGNOSIS — G4730 Sleep apnea, unspecified: Secondary | ICD-10-CM | POA: Diagnosis not present

## 2020-02-18 DIAGNOSIS — D696 Thrombocytopenia, unspecified: Secondary | ICD-10-CM | POA: Diagnosis not present

## 2020-02-18 DIAGNOSIS — Z79818 Long term (current) use of other agents affecting estrogen receptors and estrogen levels: Secondary | ICD-10-CM | POA: Diagnosis not present

## 2020-02-18 LAB — BASIC METABOLIC PANEL
Anion gap: 7 (ref 5–15)
BUN: 17 mg/dL (ref 8–23)
CO2: 25 mmol/L (ref 22–32)
Calcium: 7.9 mg/dL — ABNORMAL LOW (ref 8.9–10.3)
Chloride: 109 mmol/L (ref 98–111)
Creatinine, Ser: 0.55 mg/dL — ABNORMAL LOW (ref 0.61–1.24)
GFR calc Af Amer: >60 mL/min (ref >60)
GFR calc non Af Amer: >60 mL/min (ref >60)
Glucose, Bld: 100 mg/dL — ABNORMAL HIGH (ref 70–99)
Potassium: 4 mmol/L (ref 3.5–5.1)
Sodium: 141 mmol/L (ref 135–145)

## 2020-02-18 MED ORDER — LEUPROLIDE ACETATE (6 MONTH) 45 MG ~~LOC~~ KIT
45.0000 mg | PACK | Freq: Once | SUBCUTANEOUS | Status: AC
  Administered 2020-02-18: 45 mg via SUBCUTANEOUS
  Filled 2020-02-18: qty 45

## 2020-02-18 MED ORDER — DENOSUMAB 120 MG/1.7ML ~~LOC~~ SOLN
120.0000 mg | Freq: Once | SUBCUTANEOUS | Status: AC
  Administered 2020-02-18: 120 mg via SUBCUTANEOUS
  Filled 2020-02-18: qty 1.7

## 2020-02-18 NOTE — Progress Notes (Signed)
[place]  Telephone:(336) [phone] Fax:(336) [phone]  ID: Earl Ramirez OB: 25-Feb-1941  MR#: [id number]  AOZ#:[id number]  Patient Care Team: Baxter Hire, MD as PCP - General (Internal Medicine) Murrell Redden, MD (Urology) [name] Javier Docker, MD (Cardiology) Lloyd Huger, MD as Consulting Physician (Oncology)  CHIEF COMPLAINT: Stage IVB prostate cancer.   INTERVAL HISTORY: Patient returns to clinic today for evaluation and continuation of Xgeva.  [name] was discontinued and he will be starting Eligard today.  He says that overall he feels well.  Has a brown spot on his nose for the past for 5 months.  Possibly enlarging.  Has not seen dermatology and does not have a dermatologist.  Endorses weight loss over the past few months.  Has not seen dietitian. He had labs drawn at pcp this morning. Denies pain.  No chest pain, shortness of breath, cough, hemoptysis.  No nausea or vomiting.  No constipation or diarrhea.  No urinary complaints.  No further specific complaints today.  No fevers or illness.  No dizziness or weakness.  REVIEW OF SYSTEMS:   Review of Systems  Constitutional: Positive for weight loss. Negative for fever and malaise/fatigue.  Respiratory: Negative.  Negative for cough, hemoptysis and shortness of breath.   Cardiovascular: Negative.  Negative for chest pain and leg swelling.  Gastrointestinal: Negative.  Negative for abdominal pain and constipation.  Genitourinary: Negative.  Negative for frequency.  Musculoskeletal: Negative.  Negative for back pain and neck pain.  Skin: Negative.  Negative for rash.  Neurological: Negative.  Negative for dizziness, focal weakness, weakness and headaches.  Psychiatric/Behavioral: Negative.  The patient is not nervous/anxious.   As per HPI. Otherwise, a complete review of systems is negative.  PAST MEDICAL HISTORY: Past Medical History:  Diagnosis Date  . Aortic valve replaced    1998  . Arthritis     . Atrial fibrillation ([place])   . COPD (chronic obstructive pulmonary disease) ([place])   . Dysrhythmia    atrial fib  . Hernia of abdominal wall   . Hypertension   . Indigestion   . Neuropathy   . Neuropathy   . Prostate cancer ([place])   . Sleep apnea   . Thrombocytopenia ([place])   . Tremors of nervous system   . Urinary problem     PAST SURGICAL HISTORY: Past Surgical History:  Procedure Laterality Date  . CALDWELL LUC    . tongue      growth removed  . TRANSURETHRAL RESECTION OF PROSTATE N/A 01/18/2019   Procedure: CHANNEL TRANSURETHRAL RESECTION OF THE PROSTATE (TURP);  Surgeon: [name], MD;  Location: [facility] ORS;  Service: Urology;  Laterality: N/A;  . VALVE REPLACEMENT      FAMILY HISTORY: Family History  Problem Relation Age of Onset  . Heart attack Father   . Alcohol abuse Father   . Congestive Heart Failure Mother   . Alzheimer's disease Sister     ADVANCED DIRECTIVES (Y/N):  N  HEALTH MAINTENANCE: Social History   Tobacco Use  . Smoking status: Former Smoker    Types: Cigarettes    Quit date: 01/13/2001    Years since quitting: 19.1  . Smokeless tobacco: Former Systems developer    Quit date: 09/24/2000  Vaping Use  . Vaping Use: Never used  Substance Use Topics  . Alcohol use: Yes    Alcohol/week: 10.0 standard drinks    Types: 10 Cans of beer per week  . Drug use: No    Colonoscopy:  Bone density:  Lipid panel:  Allergies  Allergen Reactions  . Oxycodone-Acetaminophen Nausea Only and Shortness Of Breath  . Amoxicillin Itching    Hand itching  . Penicillins Rash    Did it involve swelling of the face/tongue/throat, SOB, or low BP? No Did it involve sudden or severe rash/hives, skin peeling, or any reaction on the inside of your mouth or nose? No Did you need to seek medical attention at a hospital or doctor's office? No When did it last happen?4-5 years ago If all above answers are "NO", may proceed with cephalosporin use.     Current  Outpatient Medications  Medication Sig Dispense Refill  . ALPRAZolam (XANAX) 0.25 MG tablet Take 0.25 mg by mouth daily as needed for anxiety.     Marland Kitchen aspirin EC 81 MG tablet Take 81 mg by mouth daily.     Marland Kitchen docusate sodium (COLACE) 100 MG capsule Take 1 capsule (100 mg total) by mouth 2 (two) times daily. 60 capsule 0  . gabapentin (NEURONTIN) 300 MG capsule Take 900 mg by mouth 4 (four) times daily.    Marland Kitchen HYDROcodone-acetaminophen (NORCO/VICODIN) 5-325 MG tablet Take 1-2 tablets by mouth every 6 (six) hours as needed for moderate pain. 6 tablet 0  . metoprolol succinate (TOPROL-XL) 25 MG 24 hr tablet Take 25 mg by mouth daily.     . tamsulosin (FLOMAX) 0.4 MG CAPS capsule Take 1 capsule by mouth twice daily 90 capsule 3   No current facility-administered medications for this visit.    OBJECTIVE: Vitals:   02/18/20 1322  BP: 139/82  Pulse: 75  Resp: 20  Temp: 97.9 F (36.6 C)  SpO2: 95%     Body mass index is 37.98 kg/m.     ECOG FS:0 - Asymptomatic  General: Well-developed, well-nourished, no acute distress. Eyes: Pink conjunctiva, anicteric sclera. Lungs: Clear to auscultation bilaterally. Heart: Regular rate and rhythm.  Abdomen: Soft, nontender, nondistended.  Musculoskeletal: No edema, cyanosis, or clubbing. Neuro: Alert, answering all questions appropriately. Cranial nerves grossly intact. Skin: No rashes or petechiae noted. Psych: Normal affect.  LAB RESULTS:  Lab Results  Component Value Date   NA 141 02/18/2020   K 4.0 02/18/2020   CL 109 02/18/2020   CO2 25 02/18/2020   GLUCOSE 100 (H) 02/18/2020   BUN 17 02/18/2020   CREATININE 0.55 (L) 02/18/2020   CALCIUM 7.9 (L) 02/18/2020   PROT 6.3 (L) 12/20/2019   ALBUMIN 3.8 12/20/2019   AST 36 12/20/2019   ALT 31 12/20/2019   ALKPHOS 63 12/20/2019   BILITOT 1.4 (H) 12/20/2019   GFRNONAA >60 02/18/2020   GFRAA >60 02/18/2020    Lab Results  Component Value Date   WBC 1.7 (L) 01/21/2020   NEUTROABS 1.1 (L)  01/21/2020   HGB 12.7 (L) 01/21/2020   HCT 38.0 (L) 01/21/2020   MCV 99.5 01/21/2020   PLT 38 (L) 01/21/2020    STUDIES: No results found.  ASSESSMENT: Stage IVB prostate cancer  PLAN:    1.  Stage IVB prostate cancer: Patient initially received XRT for local control of disease since 2016.  His PSA had trended up to 7.6.  Most recently PSA has trended down slightly to 5.37.  Today's result pending.  Axiom PET on 10/05/2019 revealed multiple bony lesions.  Patient received first dose of Firmagon on 10/11/2019.  Candace Gallus for bone lesions on 10/22/2019.  Zytiga or Xtandi on hold currently given minimal tumor burden and consider in the future if needed.  Firmagon discontinued.  Initiate Eligard every 6 months.  Labs reviewed today and acceptable for treatment.  We reviewed side effects and patient agreeable to treatment.  2.  Pancytopenia: Chronic and unchanged.  Continue to monitor closely.  Can consider bone marrow biopsy in the future if necessary.  3.  Hypocalcemia: Corrected calcium 8.06.  Likely more secondary to [facility].  Okay to give.  Start calcium supplementation 1200 mg daily.  Will add vitamin D onto today's labs.  Recheck at next visit.   4. Skin lesion-etiology unclear.  Will refer to dermatology for evaluation and management.  Discussed regular skin screening exams  5.  Weight loss-weight trending down.  Will refer to dietitian for evaluation and management  follow up in 4 weeks with lab and xgeva only  Patient expressed understanding and was in agreement with this plan. He also understands that He can call clinic at any time with any questions, concerns, or complaints.   Cancer Staging Prostate cancer [facility]) Staging form: Prostate, AJCC 8th Edition - Clinical: Stage IVB (cTX, cN0, pM1b, PSA: 7.6) - Signed by Lloyd Huger, MD on 10/14/2019   Verlon Au, NP   02/18/2020 4:34 PM

## 2020-02-21 ENCOUNTER — Encounter: Payer: Self-pay | Admitting: Oncology

## 2020-02-28 DIAGNOSIS — I4892 Unspecified atrial flutter: Secondary | ICD-10-CM | POA: Diagnosis not present

## 2020-02-28 DIAGNOSIS — Z0001 Encounter for general adult medical examination with abnormal findings: Secondary | ICD-10-CM | POA: Diagnosis not present

## 2020-02-28 DIAGNOSIS — G4730 Sleep apnea, unspecified: Secondary | ICD-10-CM | POA: Diagnosis not present

## 2020-02-28 DIAGNOSIS — I872 Venous insufficiency (chronic) (peripheral): Secondary | ICD-10-CM | POA: Diagnosis not present

## 2020-02-28 DIAGNOSIS — G609 Hereditary and idiopathic neuropathy, unspecified: Secondary | ICD-10-CM | POA: Diagnosis not present

## 2020-02-28 DIAGNOSIS — J42 Unspecified chronic bronchitis: Secondary | ICD-10-CM | POA: Diagnosis not present

## 2020-02-28 DIAGNOSIS — D61818 Other pancytopenia: Secondary | ICD-10-CM | POA: Diagnosis not present

## 2020-03-06 ENCOUNTER — Telehealth: Payer: Self-pay | Admitting: *Deleted

## 2020-03-06 NOTE — Telephone Encounter (Signed)
Lets do lab and see [facility]

## 2020-03-06 NOTE — Telephone Encounter (Signed)
Patient called reporting that he has been taking 1250 mg of calcium 6 times a day per what what written down for him on a piece of paper the last time he was in the office and saw Alease Medina, NP. He states he is now having abdominal distress with alternating diarrhea and constipation. He state he cannot continue to do this. I read L Allens note that states he is to take 1200 mg per day and not 6 times a day and advised patient of this. Please call patient to discuss this and what he should do as he has been taking calcium 6 times a day for a week.

## 2020-03-06 NOTE — Telephone Encounter (Signed)
RN called patient back to tell him we would like to do labs today and see NP in [facility]. Patient states I can't come in today. He thinks he is having too much diarrhea to leave the house. He began taking 6 1200mg  tabs of calcium daily on 02/18/20. Patient has not taken any calcium so far today. Instructed  him to stop calcium immediately until we can see him. RN requested to see patient in clinic tomorrow. He would prefer I give him a call in the morning to see if he can make an appt. If symptoms worsen , he needs to call 911 for medical assistance.

## 2020-03-06 NOTE — Telephone Encounter (Signed)
Sharyn Lull is calling now. Thanks.  Faythe Casa, NP 03/06/2020 12:11 PM

## 2020-03-07 ENCOUNTER — Other Ambulatory Visit: Payer: Self-pay

## 2020-03-07 ENCOUNTER — Other Ambulatory Visit: Payer: Self-pay | Admitting: *Deleted

## 2020-03-07 ENCOUNTER — Inpatient Hospital Stay: Payer: PPO | Attending: Oncology

## 2020-03-07 ENCOUNTER — Telehealth: Payer: Self-pay | Admitting: *Deleted

## 2020-03-07 ENCOUNTER — Inpatient Hospital Stay (HOSPITAL_BASED_OUTPATIENT_CLINIC_OR_DEPARTMENT_OTHER): Payer: PPO | Admitting: Oncology

## 2020-03-07 VITALS — BP 129/54 | HR 66 | Temp 98.1°F | Resp 20 | Wt 235.1 lb

## 2020-03-07 DIAGNOSIS — R197 Diarrhea, unspecified: Secondary | ICD-10-CM | POA: Insufficient documentation

## 2020-03-07 DIAGNOSIS — J449 Chronic obstructive pulmonary disease, unspecified: Secondary | ICD-10-CM | POA: Insufficient documentation

## 2020-03-07 DIAGNOSIS — I700 Atherosclerosis of aorta: Secondary | ICD-10-CM | POA: Diagnosis not present

## 2020-03-07 DIAGNOSIS — I4891 Unspecified atrial fibrillation: Secondary | ICD-10-CM | POA: Diagnosis not present

## 2020-03-07 DIAGNOSIS — R161 Splenomegaly, not elsewhere classified: Secondary | ICD-10-CM | POA: Diagnosis not present

## 2020-03-07 DIAGNOSIS — Z87891 Personal history of nicotine dependence: Secondary | ICD-10-CM | POA: Insufficient documentation

## 2020-03-07 DIAGNOSIS — G4730 Sleep apnea, unspecified: Secondary | ICD-10-CM | POA: Diagnosis not present

## 2020-03-07 DIAGNOSIS — C61 Malignant neoplasm of prostate: Secondary | ICD-10-CM | POA: Diagnosis not present

## 2020-03-07 DIAGNOSIS — Z79899 Other long term (current) drug therapy: Secondary | ICD-10-CM | POA: Insufficient documentation

## 2020-03-07 DIAGNOSIS — D61818 Other pancytopenia: Secondary | ICD-10-CM | POA: Insufficient documentation

## 2020-03-07 DIAGNOSIS — I10 Essential (primary) hypertension: Secondary | ICD-10-CM | POA: Insufficient documentation

## 2020-03-07 DIAGNOSIS — Z7982 Long term (current) use of aspirin: Secondary | ICD-10-CM | POA: Diagnosis not present

## 2020-03-07 DIAGNOSIS — G629 Polyneuropathy, unspecified: Secondary | ICD-10-CM | POA: Insufficient documentation

## 2020-03-07 DIAGNOSIS — R9721 Rising PSA following treatment for malignant neoplasm of prostate: Secondary | ICD-10-CM | POA: Diagnosis not present

## 2020-03-07 LAB — PSA: Prostatic Specific Antigen: 6.2 ng/mL — ABNORMAL HIGH (ref 0.00–4.00)

## 2020-03-07 LAB — CBC WITH DIFFERENTIAL/PLATELET
Abs Immature Granulocytes: 0.01 10*3/uL (ref 0.00–0.07)
Basophils Absolute: 0 10*3/uL (ref 0.0–0.1)
Basophils Relative: 1 %
Eosinophils Absolute: 0 10*3/uL (ref 0.0–0.5)
Eosinophils Relative: 2 %
HCT: 38 % — ABNORMAL LOW (ref 39.0–52.0)
Hemoglobin: 12.9 g/dL — ABNORMAL LOW (ref 13.0–17.0)
Immature Granulocytes: 1 %
Lymphocytes Relative: 22 %
Lymphs Abs: 0.4 10*3/uL — ABNORMAL LOW (ref 0.7–4.0)
MCH: 33.4 pg (ref 26.0–34.0)
MCHC: 33.9 g/dL (ref 30.0–36.0)
MCV: 98.4 fL (ref 80.0–100.0)
Monocytes Absolute: 0.2 10*3/uL (ref 0.1–1.0)
Monocytes Relative: 10 %
Neutro Abs: 1.3 10*3/uL — ABNORMAL LOW (ref 1.7–7.7)
Neutrophils Relative %: 64 %
Platelets: 38 10*3/uL — ABNORMAL LOW (ref 150–400)
RBC: 3.86 MIL/uL — ABNORMAL LOW (ref 4.22–5.81)
RDW: 13.6 % (ref 11.5–15.5)
WBC: 2 10*3/uL — ABNORMAL LOW (ref 4.0–10.5)
nRBC: 0 % (ref 0.0–0.2)

## 2020-03-07 LAB — COMPREHENSIVE METABOLIC PANEL
ALT: 30 U/L (ref 0–44)
AST: 38 U/L (ref 15–41)
Albumin: 3.7 g/dL (ref 3.5–5.0)
Alkaline Phosphatase: 47 U/L (ref 38–126)
Anion gap: 8 (ref 5–15)
BUN: 21 mg/dL (ref 8–23)
CO2: 28 mmol/L (ref 22–32)
Calcium: 8.3 mg/dL — ABNORMAL LOW (ref 8.9–10.3)
Chloride: 106 mmol/L (ref 98–111)
Creatinine, Ser: 0.65 mg/dL (ref 0.61–1.24)
GFR calc Af Amer: >60 mL/min (ref >60)
GFR calc non Af Amer: >60 mL/min (ref >60)
Glucose, Bld: 100 mg/dL — ABNORMAL HIGH (ref 70–99)
Potassium: 4.1 mmol/L (ref 3.5–5.1)
Sodium: 142 mmol/L (ref 135–145)
Total Bilirubin: 1.9 mg/dL — ABNORMAL HIGH (ref 0.3–1.2)
Total Protein: 5.8 g/dL — ABNORMAL LOW (ref 6.5–8.1)

## 2020-03-07 NOTE — Progress Notes (Signed)
Diarrhea and abdominal pain started last Friday. Pt started on calcium 02/18/20 after seeing NP Zenia Resides. Pt understood he should begin taking 1200mg  of calcium 6 x daily (7,200mg  calcium a day) Pt felt weak and started thinking what could be causing him to suddenly feel bad. Pt alert and orient x 3. Essential tremors had gotten worse over last 4 days. States better today.

## 2020-03-07 NOTE — Progress Notes (Signed)
Symptom Management Consult note [facility]  [phone] [phone]  Patient Care Team: Baxter Hire, MD as PCP - General (Internal Medicine) Murrell Redden, MD (Urology) [name] Javier Docker, MD (Cardiology) Lloyd Huger, MD as Consulting Physician (Oncology)   Name of the patient: Earl Ramirez  [id number]  [birth year]   Date of visit: 03/07/2020   Diagnosis-stage IVb prostate cancer  Chief complaint/ Reason for visit-medication review  Heme/Onc history:  Oncology History  Prostate cancer ([place])  01/18/2019 Initial Diagnosis   Prostate cancer ([place])   10/14/2019 Cancer Staging   Staging form: Prostate, AJCC 8th Edition - Clinical: Stage IVB (cTX, cN0, pM1b, PSA: 7.6) - Signed by Lloyd Huger, MD on 10/14/2019    Interval history-Mr. Sanfilippo is an 79 year old male with past medical history significant for aortic valve disease, atrial flutter, COPD, sleep apnea, splenomegaly and thrombocytopenia.  He is followed by Dr. [name] for stage IV prostate cancer and is receiving [name] and Delton See.  Plan is to transition to Eligard every 6 months.  May initiate Zytiga or Xtandi in the future if PSA continues to trend up.  Currently minimal tumor burden.  He also has extensive pancytopenia.  There is the potential for a bone marrow biopsy if this is persistent.  He was evaluated on 02/18/2020 by Beckey Rutter, NP prior to [facility] injection.  His calcium level was found to be low at 8.06 corrected secondary to [facility].  He was instructed to start 1200 mg of calcium supplements daily.  Patient called clinic 2 days ago with concerns of diarrhea.  He was having 8-10 loose stools daily-unclear etiology.  After reviewing his medication list with triage RN, it appears he has been taking six 1200 mg calcium tablets daily for the past 2 weeks.  Onset of symptoms was 2 days after starting his calcium supplements.  Patient states "I must of heard her  wrong".  He denies any nausea or vomiting, constipation, abdominal pain, shortness of breath, chest pain or any additional symptoms.  He admits to daily diarrhea liquid consistency.  No foul smell. No recent antibiotic use. He has been unable to go do normal activities for fear of incontinence.  He has not tried any antidiarrheals at this time.  He has stopped his calcium supplements as of yesterday and diarrhea is beginning to form up and episodes are less frequent. He has been eating and drinking well.  ECOG FS:1 - Symptomatic but completely ambulatory  Review of systems- Review of Systems  Constitutional: Negative.  Negative for chills, fever, malaise/fatigue and weight loss.  HENT: Negative for congestion, ear pain and tinnitus.   Eyes: Negative.  Negative for blurred vision and double vision.  Respiratory: Negative.  Negative for cough, sputum production and shortness of breath.   Cardiovascular: Negative.  Negative for chest pain, palpitations and leg swelling.  Gastrointestinal: Positive for diarrhea. Negative for abdominal pain, constipation, nausea and vomiting.  Genitourinary: Negative for dysuria, frequency and urgency.  Musculoskeletal: Negative for back pain and falls.  Skin: Negative.  Negative for rash.  Neurological: Negative.  Negative for weakness and headaches.  Endo/Heme/Allergies: Negative.  Does not bruise/bleed easily.  Psychiatric/Behavioral: Negative.  Negative for depression. The patient is not nervous/anxious and does not have insomnia.      Current treatment-Eligard and Xgeva.  Allergies  Allergen Reactions  . Oxycodone-Acetaminophen Nausea Only and Shortness Of Breath  . Amoxicillin Itching    Hand itching  . Penicillins Rash  Did it involve swelling of the face/tongue/throat, SOB, or low BP? No Did it involve sudden or severe rash/hives, skin peeling, or any reaction on the inside of your mouth or nose? No Did you need to seek medical attention at a  hospital or doctor's office? No When did it last happen?4-5 years ago If all above answers are "NO", may proceed with cephalosporin use.      Past Medical History:  Diagnosis Date  . Aortic valve replaced    1998  . Arthritis   . Atrial fibrillation ([place])   . COPD (chronic obstructive pulmonary disease) ([place])   . Dysrhythmia    atrial fib  . Hernia of abdominal wall   . Hypertension   . Indigestion   . Neuropathy   . Neuropathy   . Prostate cancer ([place])   . Sleep apnea   . Thrombocytopenia ([place])   . Tremors of nervous system   . Urinary problem      Past Surgical History:  Procedure Laterality Date  . CALDWELL LUC    . tongue      growth removed  . TRANSURETHRAL RESECTION OF PROSTATE N/A 01/18/2019   Procedure: CHANNEL TRANSURETHRAL RESECTION OF THE PROSTATE (TURP);  Surgeon: [name], MD;  Location: [facility] ORS;  Service: Urology;  Laterality: N/A;  . VALVE REPLACEMENT      Social History   Socioeconomic History  . Marital status: Married    Spouse name: Not on file  . Number of children: Not on file  . Years of education: Not on file  . Highest education level: Not on file  Occupational History  . Not on file  Tobacco Use  . Smoking status: Former Smoker    Types: Cigarettes    Quit date: 01/13/2001    Years since quitting: 19.1  . Smokeless tobacco: Former Systems developer    Quit date: 09/24/2000  Vaping Use  . Vaping Use: Never used  Substance and Sexual Activity  . Alcohol use: Yes    Alcohol/week: 10.0 standard drinks    Types: 10 Cans of beer per week  . Drug use: No  . Sexual activity: Not on file  Other Topics Concern  . Not on file  Social History Narrative  . Not on file   Social Determinants of Health   Financial Resource Strain:   . Difficulty of Paying Living Expenses:   Food Insecurity:   . Worried About Charity fundraiser in the Last Year:   . Arboriculturist in the Last Year:   Transportation Needs:   . Film/video editor  (Medical):   Marland Kitchen Lack of Transportation (Non-Medical):   Physical Activity:   . Days of Exercise per Week:   . Minutes of Exercise per Session:   Stress:   . Feeling of Stress :   Social Connections:   . Frequency of Communication with Friends and Family:   . Frequency of Social Gatherings with Friends and Family:   . Attends Religious Services:   . Active Member of Clubs or Organizations:   . Attends Archivist Meetings:   Marland Kitchen Marital Status:   Intimate Partner Violence:   . Fear of Current or Ex-Partner:   . Emotionally Abused:   Marland Kitchen Physically Abused:   . Sexually Abused:     Family History  Problem Relation Age of Onset  . Heart attack Father   . Alcohol abuse Father   . Congestive Heart Failure Mother   . Alzheimer's  disease Sister      Current Outpatient Medications:  .  aspirin EC 81 MG tablet, Take 81 mg by mouth daily. , Disp: , Rfl:  .  gabapentin (NEURONTIN) 300 MG capsule, Take 900 mg by mouth 4 (four) times daily., Disp: , Rfl:  .  metoprolol succinate (TOPROL-XL) 25 MG 24 hr tablet, Take 25 mg by mouth daily. , Disp: , Rfl:  .  tamsulosin (FLOMAX) 0.4 MG CAPS capsule, Take 1 capsule by mouth twice daily, Disp: 90 capsule, Rfl: 3 .  ALPRAZolam (XANAX) 0.25 MG tablet, Take 0.25 mg by mouth daily as needed for anxiety.  (Patient not taking: Reported on 03/07/2020), Disp: , Rfl:  .  docusate sodium (COLACE) 100 MG capsule, Take 1 capsule (100 mg total) by mouth 2 (two) times daily. (Patient not taking: Reported on 03/07/2020), Disp: 60 capsule, Rfl: 0 .  HYDROcodone-acetaminophen (NORCO/VICODIN) 5-325 MG tablet, Take 1-2 tablets by mouth every 6 (six) hours as needed for moderate pain. (Patient not taking: Reported on 03/07/2020), Disp: 6 tablet, Rfl: 0  Physical exam:  Vitals:   03/07/20 1532  BP: (!) 129/54  Pulse: 66  Resp: 20  Temp: 98.1 F (36.7 C)  TempSrc: Tympanic  SpO2: 98%  Weight: 235 lb 1.6 oz (106.6 kg)   Physical Exam Constitutional:       Appearance: Normal appearance.  HENT:     Head: Normocephalic and atraumatic.  Eyes:     Pupils: Pupils are equal, round, and reactive to light.  Cardiovascular:     Rate and Rhythm: Normal rate and regular rhythm.     Heart sounds: Normal heart sounds. No murmur heard.   Pulmonary:     Effort: Pulmonary effort is normal.     Breath sounds: Normal breath sounds. No wheezing.  Abdominal:     General: Bowel sounds are normal. There is no distension.     Palpations: Abdomen is soft.     Tenderness: There is no abdominal tenderness.  Musculoskeletal:        General: Normal range of motion.     Cervical back: Normal range of motion.  Skin:    General: Skin is warm and dry.     Findings: No rash.  Neurological:     Mental Status: He is alert and oriented to person, place, and time.  Psychiatric:        Judgment: Judgment normal.      CMP Latest Ref Rng & Units 03/07/2020  Glucose 70 - 99 mg/dL 100(H)  BUN 8 - 23 mg/dL 21  Creatinine 0.61 - 1.24 mg/dL 0.65  Sodium 135 - 145 mmol/L 142  Potassium 3.5 - 5.1 mmol/L 4.1  Chloride 98 - 111 mmol/L 106  CO2 22 - 32 mmol/L 28  Calcium 8.9 - 10.3 mg/dL 8.3(L)  Total Protein 6.5 - 8.1 g/dL 5.8(L)  Total Bilirubin 0.3 - 1.2 mg/dL 1.9(H)  Alkaline Phos 38 - 126 U/L 47  AST 15 - 41 U/L 38  ALT 0 - 44 U/L 30   CBC Latest Ref Rng & Units 03/07/2020  WBC 4.0 - 10.5 K/uL 2.0(L)  Hemoglobin 13.0 - 17.0 g/dL 12.9(L)  Hematocrit 39 - 52 % 38.0(L)  Platelets 150 - 400 K/uL 38(L)    No images are attached to the encounter.  No results found.  Assessment and plan- Patient is a 79 y.o. male with past medical history significant for stage IV prostate cancer followed by Dr. [name] on monthly Delton See and every 31-month  Eligard who [facility] to [facility] for concerns of diarrhea.  Stage IV prostate cancer-on Eligard and Xgeva.  PSA slowly rising at 6.20 today.  Will consult with Dr. [name].  He does have follow-up in the next week or so.  Diarrhea  likely secondary to calcium supplementation.  Stopped calcium supplementation yesterday.  Quantity and consistency of bowel movements has improved since discontinuation of calcium.  He had 3-4 loose/formed bowel movements yesterday and has had none today.  Given report of 8-10 bowel movements daily x2 weeks, will check lab work.  Fortunately, labs look stable.  He does not appear to be dehydrated.  Calcium level continues to be low and is 8.3 (corrected 8.5) but it is improving.  I have asked him to continue calcium supplementation at 1200 mg/day which is 1 calcium tablet daily.  Patient verbalized understanding.  He knows if symptoms persist or worsen, to call clinic.  Pancytopenia-monitoring.  Discussed neutropenic precautions along with being mindful of his low platelet count.  He denies any bleeding at this time.  Plan: Labs-fairly stable No signs of dehydration or hypercalcemia.  Continue calcium supplements at 1200 mg daily.  Disposition: RTC on 03/17/2020 for labs, MD assessment and Xgeva.  Visit Diagnosis 1. Prostate cancer ([place])   2. Diarrhea, unspecified type     Patient expressed understanding and was in agreement with this plan. He also understands that He can call clinic at any time with any questions, concerns, or complaints.   Greater than 50% was spent in counseling and coordination of care with this patient including but not limited to discussion of the relevant topics above (See A&P) including, but not limited to diagnosis and management of acute and chronic medical conditions.   Thank you for allowing me to participate in the care of this very pleasant patient.    Jacquelin Hawking, NP [place] [facility] Cell - [phone] Pager- [phone] 03/08/2020 8:40 AM

## 2020-03-07 NOTE — Telephone Encounter (Signed)
RN called to check on patient. He states he is better. Diarrhea has slowed down, but too early to know for sure if he can make it to cancer center. Patient agreed to make an appt for 2:45 lab and see NP at 3pm today

## 2020-03-11 NOTE — Progress Notes (Deleted)
[place]  [phone] [phone]  ID: Earl Ramirez OB: July 12, 1941  MR#: [id number]  SHF#:[id number]  Patient Care Team: Baxter Hire, MD as PCP - General (Internal Medicine) Murrell Redden, MD (Urology) [name] Javier Docker, MD (Cardiology) Lloyd Huger, MD as Consulting Physician (Oncology)  CHIEF COMPLAINT: Stage IVB prostate cancer.  INTERVAL HISTORY: Patient returns to clinic today for further evaluation and continuation of [place].  He continues to have problems with irritation at his injection site, but otherwise is tolerating his treatments well.  He has no neurologic complaints.  He denies any recent fevers or illnesses.  He has a good appetite and denies weight loss. He denies any pain.  He has no chest pain, shortness of breath, cough, or hemoptysis.  He denies any nausea, vomiting, constipation, or diarrhea.  He has no urinary complaints.  Patient offers no further specific complaints today.  REVIEW OF SYSTEMS:   Review of Systems  Constitutional: Negative.  Negative for fever, malaise/fatigue and weight loss.  Respiratory: Negative.  Negative for cough, hemoptysis and shortness of breath.   Cardiovascular: Negative.  Negative for chest pain and leg swelling.  Gastrointestinal: Negative.  Negative for abdominal pain and constipation.  Genitourinary: Negative.  Negative for frequency.  Musculoskeletal: Negative.  Negative for back pain and neck pain.  Skin: Negative.  Negative for rash.  Neurological: Negative.  Negative for dizziness, focal weakness, weakness and headaches.  Psychiatric/Behavioral: Negative.  The patient is not nervous/anxious.     As per HPI. Otherwise, a complete review of systems is negative.  PAST MEDICAL HISTORY: Past Medical History:  Diagnosis Date  . Aortic valve replaced    1998  . Arthritis   . Atrial fibrillation ([place])   . COPD (chronic obstructive pulmonary disease) ([place])   .  Dysrhythmia    atrial fib  . Hernia of abdominal wall   . Hypertension   . Indigestion   . Neuropathy   . Neuropathy   . Prostate cancer ([place])   . Sleep apnea   . Thrombocytopenia ([place])   . Tremors of nervous system   . Urinary problem     PAST SURGICAL HISTORY: Past Surgical History:  Procedure Laterality Date  . CALDWELL LUC    . tongue      growth removed  . TRANSURETHRAL RESECTION OF PROSTATE N/A 01/18/2019   Procedure: CHANNEL TRANSURETHRAL RESECTION OF THE PROSTATE (TURP);  Surgeon: [name], MD;  Location: [facility] ORS;  Service: Urology;  Laterality: N/A;  . VALVE REPLACEMENT      FAMILY HISTORY: Family History  Problem Relation Age of Onset  . Heart attack Father   . Alcohol abuse Father   . Congestive Heart Failure Mother   . Alzheimer's disease Sister     ADVANCED DIRECTIVES (Y/N):  N  HEALTH MAINTENANCE: Social History   Tobacco Use  . Smoking status: Former Smoker    Types: Cigarettes    Quit date: 01/13/2001    Years since quitting: 19.1  . Smokeless tobacco: Former Systems developer    Quit date: 09/24/2000  Vaping Use  . Vaping Use: Never used  Substance Use Topics  . Alcohol use: Yes    Alcohol/week: 10.0 standard drinks    Types: 10 Cans of beer per week  . Drug use: No     Colonoscopy:  PAP:  Bone density:  Lipid panel:  Allergies  Allergen Reactions  . Oxycodone-Acetaminophen Nausea Only and Shortness Of Breath  .  Amoxicillin Itching    Hand itching  . Penicillins Rash    Did it involve swelling of the face/tongue/throat, SOB, or low BP? No Did it involve sudden or severe rash/hives, skin peeling, or any reaction on the inside of your mouth or nose? No Did you need to seek medical attention at a hospital or doctor's office? No When did it last happen?4-5 years ago If all above answers are "NO", may proceed with cephalosporin use.     Current Outpatient Medications  Medication Sig Dispense Refill  . ALPRAZolam (XANAX) 0.25 MG  tablet Take 0.25 mg by mouth daily as needed for anxiety.  (Patient not taking: Reported on 03/07/2020)    . aspirin EC 81 MG tablet Take 81 mg by mouth daily.     Marland Kitchen docusate sodium (COLACE) 100 MG capsule Take 1 capsule (100 mg total) by mouth 2 (two) times daily. (Patient not taking: Reported on 03/07/2020) 60 capsule 0  . gabapentin (NEURONTIN) 300 MG capsule Take 900 mg by mouth 4 (four) times daily.    Marland Kitchen HYDROcodone-acetaminophen (NORCO/VICODIN) 5-325 MG tablet Take 1-2 tablets by mouth every 6 (six) hours as needed for moderate pain. (Patient not taking: Reported on 03/07/2020) 6 tablet 0  . metoprolol succinate (TOPROL-XL) 25 MG 24 hr tablet Take 25 mg by mouth daily.     . tamsulosin (FLOMAX) 0.4 MG CAPS capsule Take 1 capsule by mouth twice daily 90 capsule 3   No current facility-administered medications for this visit.    OBJECTIVE: There were no vitals filed for this visit.   There is no height or weight on file to calculate BMI.    ECOG FS:0 - Asymptomatic  General: Well-developed, well-nourished, no acute distress. Eyes: Pink conjunctiva, anicteric sclera. HEENT: Normocephalic, moist mucous membranes. Lungs: No audible wheezing or coughing. Heart: Regular rate and rhythm. Abdomen: Soft, nontender, no obvious distention. Musculoskeletal: No edema, cyanosis, or clubbing. Neuro: Alert, answering all questions appropriately. Cranial nerves grossly intact. Skin: No rashes or petechiae noted. Psych: Normal affect.   LAB RESULTS:  Lab Results  Component Value Date   NA 142 03/07/2020   K 4.1 03/07/2020   CL 106 03/07/2020   CO2 28 03/07/2020   GLUCOSE 100 (H) 03/07/2020   BUN 21 03/07/2020   CREATININE 0.65 03/07/2020   CALCIUM 8.3 (L) 03/07/2020   PROT 5.8 (L) 03/07/2020   ALBUMIN 3.7 03/07/2020   AST 38 03/07/2020   ALT 30 03/07/2020   ALKPHOS 47 03/07/2020   BILITOT 1.9 (H) 03/07/2020   GFRNONAA >60 03/07/2020   GFRAA >60 03/07/2020    Lab Results  Component Value  Date   WBC 2.0 (L) 03/07/2020   NEUTROABS 1.3 (L) 03/07/2020   HGB 12.9 (L) 03/07/2020   HCT 38.0 (L) 03/07/2020   MCV 98.4 03/07/2020   PLT 38 (L) 03/07/2020     STUDIES: No results found.  ASSESSMENT: Stage IVB prostate cancer  PLAN:    1.  Stage IVB prostate cancer: Patient initially received XRT for local control disease in 2016.  His PSA had also trended up to 7.6.  Most recent PSA has trended down slightly to 5.37.  [employer] PET scan on October 05, 2019 reviewed independently with multiple bony lesions noted.  Patient received his first dose of Firmagon on Monday, October 11, 2019.  He will also benefit from [facility] for his bony lesions.  Will not initiate Zytiga or Xtandi at this time given his minimal tumor burden, but can consider this  in the future if needed.  Proceed with Firmagon and Xgeva today.  Return to clinic in 4 weeks for further evaluation and continuation of treatment.  At which time we will discontinue Firmagon and initiate Eligard every 6 months.   2.  Pancytopenia: Chronic and unchanged.  Continue to monitor closely.  Can consider bone marrow biopsy in the future if necessary. 3.  Hypocalcemia: Improved.  Proceed with Xgeva as above.  I spent a total of 30 minutes reviewing chart data, face-to-face evaluation with the patient, counseling and coordination of care as detailed above.    Patient expressed understanding and was in agreement with this plan. He also understands that He can call clinic at any time with any questions, concerns, or complaints.   Cancer Staging Prostate cancer (HCC) Staging form: Prostate, AJCC 8th Edition - Clinical: Stage IVB (cTX, cN0, pM1b, PSA: 7.6) - Signed by Finnegan, Timothy J, MD on 10/14/2019   Timothy J Finnegan, MD   03/11/2020 2:01 PM     

## 2020-03-17 ENCOUNTER — Inpatient Hospital Stay: Payer: PPO | Admitting: Oncology

## 2020-03-17 ENCOUNTER — Inpatient Hospital Stay: Payer: PPO

## 2020-03-18 NOTE — Progress Notes (Signed)
[place]  [phone] [phone]  ID: [name] OB: [birth year]  MR#: [id number]  BOF#:[id number]  Patient Care Team: Baxter Hire, MD as PCP - General (Internal Medicine) Murrell Redden, MD (Urology) [name] Javier Docker, MD (Cardiology) Lloyd Huger, MD as Consulting Physician (Oncology)  CHIEF COMPLAINT: Stage IVB prostate cancer.  INTERVAL HISTORY: Patient returns to clinic today for further evaluation and continuation of Xgeva. He currently feels well and is asymptomatic.  He has no neurologic complaints.  He denies any recent fevers or illnesses.  He has a good appetite and denies weight loss. He denies any pain.  He has no chest pain, shortness of breath, cough, or hemoptysis.  He denies any nausea, vomiting, constipation, or diarrhea.  He has no urinary complaints.  Patient offers no specific complaints today.  REVIEW OF SYSTEMS:   Review of Systems  Constitutional: Negative.  Negative for fever, malaise/fatigue and weight loss.  Respiratory: Negative.  Negative for cough, hemoptysis and shortness of breath.   Cardiovascular: Negative.  Negative for chest pain and leg swelling.  Gastrointestinal: Negative.  Negative for abdominal pain and constipation.  Genitourinary: Negative.  Negative for frequency.  Musculoskeletal: Negative.  Negative for back pain and neck pain.  Skin: Negative.  Negative for rash.  Neurological: Negative.  Negative for dizziness, focal weakness, weakness and headaches.  Psychiatric/Behavioral: Negative.  The patient is not nervous/anxious.     As per HPI. Otherwise, a complete review of systems is negative.  PAST MEDICAL HISTORY: Past Medical History:  Diagnosis Date  . Aortic valve replaced    1998  . Arthritis   . Atrial fibrillation ([place])   . COPD (chronic obstructive pulmonary disease) ([place])   . Dysrhythmia    atrial fib  . Hernia of abdominal wall   . Hypertension   . Indigestion   .  Neuropathy   . Neuropathy   . Prostate cancer ([place])   . Sleep apnea   . Thrombocytopenia ([place])   . Tremors of nervous system   . Urinary problem     PAST SURGICAL HISTORY: Past Surgical History:  Procedure Laterality Date  . CALDWELL LUC    . tongue      growth removed  . TRANSURETHRAL RESECTION OF PROSTATE N/A 01/18/2019   Procedure: CHANNEL TRANSURETHRAL RESECTION OF THE PROSTATE (TURP);  Surgeon: [name], MD;  Location: [facility] ORS;  Service: Urology;  Laterality: N/A;  . VALVE REPLACEMENT      FAMILY HISTORY: Family History  Problem Relation Age of Onset  . Heart attack Father   . Alcohol abuse Father   . Congestive Heart Failure Mother   . Alzheimer's disease Sister     ADVANCED DIRECTIVES (Y/N):  N  HEALTH MAINTENANCE: Social History   Tobacco Use  . Smoking status: Former Smoker    Types: Cigarettes    Quit date: 01/13/2001    Years since quitting: 19.2  . Smokeless tobacco: Former Systems developer    Quit date: 09/24/2000  Vaping Use  . Vaping Use: Never used  Substance Use Topics  . Alcohol use: Yes    Alcohol/week: 10.0 standard drinks    Types: 10 Cans of beer per week  . Drug use: No     Colonoscopy:  PAP:  Bone density:  Lipid panel:  Allergies  Allergen Reactions  . Oxycodone-Acetaminophen Nausea Only and Shortness Of Breath  . Amoxicillin Itching    Hand itching  . Penicillins Rash    Did  it involve swelling of the face/tongue/throat, SOB, or low BP? No Did it involve sudden or severe rash/hives, skin peeling, or any reaction on the inside of your mouth or nose? No Did you need to seek medical attention at a hospital or doctor's office? No When did it last happen?4-5 years ago If all above answers are "NO", may proceed with cephalosporin use.     Current Outpatient Medications  Medication Sig Dispense Refill  . aspirin EC 81 MG tablet Take 81 mg by mouth daily.     . calcium carbonate (TUMS - DOSED IN MG ELEMENTAL CALCIUM) 500 MG  chewable tablet Chew 1 tablet by mouth daily.    Marland Kitchen gabapentin (NEURONTIN) 300 MG capsule Take 900 mg by mouth 4 (four) times daily.    . metoprolol succinate (TOPROL-XL) 25 MG 24 hr tablet Take 25 mg by mouth daily.     . tamsulosin (FLOMAX) 0.4 MG CAPS capsule Take 1 capsule by mouth twice daily 90 capsule 3  . ALPRAZolam (XANAX) 0.25 MG tablet Take 0.25 mg by mouth daily as needed for anxiety.  (Patient not taking: Reported on 03/07/2020)    . docusate sodium (COLACE) 100 MG capsule Take 1 capsule (100 mg total) by mouth 2 (two) times daily. (Patient not taking: Reported on 03/07/2020) 60 capsule 0  . HYDROcodone-acetaminophen (NORCO/VICODIN) 5-325 MG tablet Take 1-2 tablets by mouth every 6 (six) hours as needed for moderate pain. (Patient not taking: Reported on 03/07/2020) 6 tablet 0   Current Facility-Administered Medications  Medication Dose Route Frequency Provider Last Rate Last Admin  . denosumab (XGEVA) injection 120 mg  120 mg Subcutaneous Once Lloyd Huger, MD        OBJECTIVE: Vitals:   03/24/20 1359  BP: 126/68  Pulse: 71  Resp: 20  Temp: 99.2 F (37.3 C)  SpO2: 97%     Body mass index is 38.34 kg/m.    ECOG FS:0 - Asymptomatic  General: Well-developed, well-nourished, no acute distress. Eyes: Pink conjunctiva, anicteric sclera. HEENT: Normocephalic, moist mucous membranes. Lungs: No audible wheezing or coughing. Heart: Regular rate and rhythm. Abdomen: Soft, nontender, no obvious distention. Musculoskeletal: No edema, cyanosis, or clubbing. Neuro: Alert, answering all questions appropriately. Cranial nerves grossly intact. Skin: No rashes or petechiae noted. Psych: Normal affect.   LAB RESULTS:  Lab Results  Component Value Date   NA 142 03/24/2020   K 4.4 03/24/2020   CL 111 03/24/2020   CO2 26 03/24/2020   GLUCOSE 104 (H) 03/24/2020   BUN 17 03/24/2020   CREATININE 0.60 (L) 03/24/2020   CALCIUM 8.1 (L) 03/24/2020   PROT 5.8 (L) 03/07/2020   ALBUMIN  3.7 03/07/2020   AST 38 03/07/2020   ALT 30 03/07/2020   ALKPHOS 47 03/07/2020   BILITOT 1.9 (H) 03/07/2020   GFRNONAA >60 03/24/2020   GFRAA >60 03/24/2020    Lab Results  Component Value Date   WBC 1.8 (L) 03/24/2020   NEUTROABS 1.2 (L) 03/24/2020   HGB 12.6 (L) 03/24/2020   HCT 37.2 (L) 03/24/2020   MCV 99.7 03/24/2020   PLT 42 (L) 03/24/2020     STUDIES: No results found.  ASSESSMENT: Stage IVB prostate cancer  PLAN:    1.  Stage IVB prostate cancer: Patient initially received XRT for local control disease in 2016. His PSA initially trended down to 5.37, but now has trended up to 6.20. Todays result is pending.  [facility] PET scan on October 05, 2019 reviewed independently with multiple  bony lesions noted.  Patient received his first dose of Firmagon on October 11, 2019.  He will also benefit from [facility] for his bony lesions.  Will not initiate Zytiga or Xtandi at this time given his minimal tumor burden, but can consider this in the future if PSA continues to trend up.  He last received Eligard on February 18, 2020.  Proceed with Xgeva today. Return to clinic in 4 weeks for further evaluation and continuation of treatment. 2.  Pancytopenia: Chronic and unchanged.  Continue to monitor closely.  Can consider bone marrow biopsy in the future if necessary. 3.  Hypocalcemia: Chronic and unchanged.  Proceed with Xgeva as above. Continue oral calcium supplementation.  I spent a total of 30 minutes reviewing chart data, face-to-face evaluation with the patient, counseling and coordination of care as detailed above.    Patient expressed understanding and was in agreement with this plan. He also understands that He can call clinic at any time with any questions, concerns, or complaints.   Cancer Staging Prostate cancer [facility]) Staging form: Prostate, AJCC 8th Edition - Clinical: Stage IVB (cTX, cN0, pM1b, PSA: 7.6) - Signed by Lloyd Huger, MD on 10/14/2019   Lloyd Huger, MD    03/24/2020 2:19 PM

## 2020-03-23 ENCOUNTER — Ambulatory Visit (INDEPENDENT_AMBULATORY_CARE_PROVIDER_SITE_OTHER): Payer: PPO | Admitting: Vascular Surgery

## 2020-03-23 ENCOUNTER — Encounter (INDEPENDENT_AMBULATORY_CARE_PROVIDER_SITE_OTHER): Payer: Self-pay | Admitting: Vascular Surgery

## 2020-03-23 ENCOUNTER — Other Ambulatory Visit: Payer: Self-pay

## 2020-03-23 VITALS — BP 160/77 | HR 108 | Resp 18 | Ht 67.0 in | Wt 243.0 lb

## 2020-03-23 DIAGNOSIS — I4892 Unspecified atrial flutter: Secondary | ICD-10-CM | POA: Diagnosis not present

## 2020-03-23 DIAGNOSIS — I739 Peripheral vascular disease, unspecified: Secondary | ICD-10-CM | POA: Diagnosis not present

## 2020-03-23 DIAGNOSIS — J449 Chronic obstructive pulmonary disease, unspecified: Secondary | ICD-10-CM | POA: Diagnosis not present

## 2020-03-23 DIAGNOSIS — I872 Venous insufficiency (chronic) (peripheral): Secondary | ICD-10-CM | POA: Insufficient documentation

## 2020-03-23 DIAGNOSIS — I890 Lymphedema, not elsewhere classified: Secondary | ICD-10-CM | POA: Insufficient documentation

## 2020-03-23 NOTE — Progress Notes (Signed)
MRN : [id number]  Earl Ramirez is a 79 y.o. (Sep 21, 1940) male who presents with chief complaint of  Chief Complaint  Patient presents with  . New Patient (Initial Visit)    venous issues  .  History of Present Illness:   Patient is seen for evaluation of leg swelling. The patient first noticed the swelling remotely but is now concerned because of a steady increase in the overall edema. The swelling is associated with pain and discoloration, which has also been steadily increasing. The patient notes that in the morning the legs are slightly improved but they steadily worsened throughout the course of the day. Elevation makes the legs better, dependency makes them much worse.   There is no history of ulcerations associated with the swelling.   The patient denies any recent changes in their medications.  The patient has worn graduated compression but finds them outright painful as the day progresses.  The patient has no had any past angiography, interventions or vascular surgery.  The patient denies a history of DVT or PE. There is no prior history of phlebitis. There is no history of primary lymphedema.  There is no history of radiation treatment to the groin or pelvis No history of malignancies. No history of trauma or groin or pelvic surgery. No history of foreign travel or parasitic infections area    Current Meds  Medication Sig  . aspirin EC 81 MG tablet Take 81 mg by mouth daily.   . calcium carbonate (TUMS - DOSED IN MG ELEMENTAL CALCIUM) 500 MG chewable tablet Chew 1 tablet by mouth daily.  Marland Kitchen gabapentin (NEURONTIN) 300 MG capsule Take 900 mg by mouth 4 (four) times daily.  . metoprolol succinate (TOPROL-XL) 25 MG 24 hr tablet Take 25 mg by mouth daily.   . tamsulosin (FLOMAX) 0.4 MG CAPS capsule Take 1 capsule by mouth twice daily    Past Medical History:  Diagnosis Date  . Aortic valve replaced    1998  . Arthritis   . Atrial fibrillation ([place])   . COPD  (chronic obstructive pulmonary disease) ([place])   . Dysrhythmia    atrial fib  . Hernia of abdominal wall   . Hypertension   . Indigestion   . Neuropathy   . Neuropathy   . Prostate cancer ([place])   . Sleep apnea   . Thrombocytopenia ([place])   . Tremors of nervous system   . Urinary problem     Past Surgical History:  Procedure Laterality Date  . CALDWELL LUC    . tongue      growth removed  . TRANSURETHRAL RESECTION OF PROSTATE N/A 01/18/2019   Procedure: CHANNEL TRANSURETHRAL RESECTION OF THE PROSTATE (TURP);  Surgeon: [name], MD;  Location: [facility] ORS;  Service: Urology;  Laterality: N/A;  . VALVE REPLACEMENT      Social History Social History   Tobacco Use  . Smoking status: Former Smoker    Types: Cigarettes    Quit date: 01/13/2001    Years since quitting: 19.2  . Smokeless tobacco: Former Systems developer    Quit date: 09/24/2000  Vaping Use  . Vaping Use: Never used  Substance Use Topics  . Alcohol use: Yes    Alcohol/week: 10.0 standard drinks    Types: 10 Cans of beer per week  . Drug use: No    Family History Family History  Problem Relation Age of Onset  . Heart attack Father   . Alcohol abuse Father   .  Congestive Heart Failure Mother   . Alzheimer's disease Sister   No family history of bleeding/clotting disorders, porphyria or autoimmune disease   Allergies  Allergen Reactions  . Oxycodone-Acetaminophen Nausea Only and Shortness Of Breath  . Amoxicillin Itching    Hand itching  . Penicillins Rash    Did it involve swelling of the face/tongue/throat, SOB, or low BP? No Did it involve sudden or severe rash/hives, skin peeling, or any reaction on the inside of your mouth or nose? No Did you need to seek medical attention at a hospital or doctor's office? No When did it last happen?4-5 years ago If all above answers are "NO", may proceed with cephalosporin use.      REVIEW OF SYSTEMS (Negative unless checked)  Constitutional: [] Weight loss   [] Fever  [] Chills Cardiac: [] Chest pain   [] Chest pressure   [] Palpitations   [] Shortness of breath when laying flat   [] Shortness of breath with exertion. Vascular:  [] Pain in legs with walking   [x] Pain in legs at rest  [] History of DVT   [] Phlebitis   [x] Swelling in legs   [] Varicose veins   [] Non-healing ulcers Pulmonary:   [] Uses home oxygen   [] Productive cough   [] Hemoptysis   [] Wheeze  [] COPD   [] Asthma Neurologic:  [] Dizziness   [] Seizures   [] History of stroke   [] History of TIA  [] Aphasia   [] Vissual changes   [] Weakness or numbness in arm   [] Weakness or numbness in leg Musculoskeletal:   [] Joint swelling   [x] Joint pain   [] Low back pain Hematologic:  [] Easy bruising  [] Easy bleeding   [] Hypercoagulable state   [] Anemic Gastrointestinal:  [] Diarrhea   [] Vomiting  [] Gastroesophageal reflux/heartburn   [] Difficulty swallowing. Genitourinary:  [] Chronic kidney disease   [] Difficult urination  [] Frequent urination   [] Blood in urine Skin:  [] Rashes   [] Ulcers  Psychological:  [] History of anxiety   []  History of major depression.  Physical Examination  Vitals:   03/23/20 1401  BP: (!) 160/77  Pulse: (!) 108  Resp: 18  Weight: 243 lb (110.2 kg)  Height: 5\' 7"  (1.702 m)   Body mass index is 38.06 kg/m. Gen: WD/WN, NAD Head: [place]/AT, No temporalis wasting.  Ear/Nose/Throat: Hearing grossly intact, nares w/o erythema or drainage, poor dentition Eyes: PER, EOMI, sclera nonicteric.  Neck: Supple, no masses.  No bruit or JVD.  Pulmonary:  Good air movement, clear to auscultation bilaterally, no use of accessory muscles.  Cardiac: RRR, normal S1, S2, no Murmurs. Vascular: scattered varicosities present bilaterally.  Severe venous stasis changes to the legs bilaterally.  4+ hard non-pitting edema Vessel Right Left  Radial Palpable Palpable  PT Not Palpable Not Palpable  DP Not Palpable Not Palpable  Gastrointestinal: soft, non-distended. No guarding/no peritoneal signs.   Musculoskeletal: M/S 5/5 throughout.  No deformity or atrophy.  Neurologic: CN 2-12 intact. Pain and light touch intact in extremities.  Symmetrical.  Speech is fluent. Motor exam as listed above. Psychiatric: Judgment intact, Mood & affect appropriate for pt's clinical situation. Dermatologic: No rashes or ulcers noted.  No changes consistent with cellulitis. Lymph : Severe lichenification and skin changes of chronic lymphedema.  CBC Lab Results  Component Value Date   WBC 2.0 (L) 03/07/2020   HGB 12.9 (L) 03/07/2020   HCT 38.0 (L) 03/07/2020   MCV 98.4 03/07/2020   PLT 38 (L) 03/07/2020    BMET    Component Value Date/Time   NA 142 03/07/2020 1449   K 4.1 03/07/2020  1449   CL 106 03/07/2020 1449   CO2 28 03/07/2020 1449   GLUCOSE 100 (H) 03/07/2020 1449   BUN 21 03/07/2020 1449   CREATININE 0.65 03/07/2020 1449   CALCIUM 8.3 (L) 03/07/2020 1449   GFRNONAA >60 03/07/2020 1449   GFRAA >60 03/07/2020 1449   Estimated Creatinine Clearance: 85.7 mL/min (by C-G formula based on SCr of 0.65 mg/dL).  COAG No results found for: INR, PROTIME  Radiology No results found.    Assessment/Plan 1. Chronic venous insufficiency I have had a long discussion with the patient regarding swelling and why it  causes symptoms.  Patient will begin wearing graduated compression stockings class 1 (20-30 mmHg) on a daily basis a prescription was given. The patient will  beginning wearing the stockings first thing in the morning and removing them in the evening. The patient is instructed specifically not to sleep in the stockings.   In addition, behavioral modification will be initiated.  This will include frequent elevation, use of over the counter pain medications and exercise such as walking.  I have reviewed systemic causes for chronic edema such as liver, kidney and cardiac etiologies.  The patient denies problems with these organ systems.    Consideration for a lymph pump will also be  made based upon the effectiveness of conservative therapy.  This would help to improve the edema control and prevent sequela such as ulcers and infections   Patient should undergo duplex ultrasound of the venous system to ensure that DVT or reflux is not present.  The patient will follow-up with me after the ultrasound.   - VAS [place] LOWER EXTREMITY VENOUS (DVT); Future  2. Lymphedema I have had a long discussion with the patient regarding swelling and why it  causes symptoms.  Patient will begin wearing graduated compression stockings class 1 (20-30 mmHg) on a daily basis a prescription was given. The patient will  beginning wearing the stockings first thing in the morning and removing them in the evening. The patient is instructed specifically not to sleep in the stockings.   In addition, behavioral modification will be initiated.  This will include frequent elevation, use of over the counter pain medications and exercise such as walking.  I have reviewed systemic causes for chronic edema such as liver, kidney and cardiac etiologies.  The patient denies problems with these organ systems.    Consideration for a lymph pump will also be made based upon the effectiveness of conservative therapy.  This would help to improve the edema control and prevent sequela such as ulcers and infections   Patient should undergo duplex ultrasound of the venous system to ensure that DVT or reflux is not present.  The patient will follow-up with me after the ultrasound.   - VAS [place] LOWER EXTREMITY VENOUS (DVT); Future  3. PAD (peripheral artery disease) ([place]) Recommend:  Patient should undergo ABI's of the lower extremity.  The patient states they are having increased pain and swelling.  The risks and benefits as well as the alternatives were discussed in detail with the patient.  All questions were answered.    The patient will follow up with me in the office to review the studies.  - VAS [place] ABI WITH/WO  TBI; Future  4. Chronic atrial flutter (HCC) Continue antiarrhythmia medications as already ordered, these medications have been reviewed and there are no changes at this time.  Continue anticoagulation as ordered by Cardiology Service   5. Chronic obstructive pulmonary disease, unspecified COPD  type [facility]) Continue pulmonary medications and aerosols as already ordered, these medications have been reviewed and there are no changes at this time.     Hortencia Pilar, MD  03/23/2020 2:53 PM

## 2020-03-24 ENCOUNTER — Encounter: Payer: Self-pay | Admitting: Oncology

## 2020-03-24 ENCOUNTER — Inpatient Hospital Stay: Payer: PPO

## 2020-03-24 ENCOUNTER — Inpatient Hospital Stay (HOSPITAL_BASED_OUTPATIENT_CLINIC_OR_DEPARTMENT_OTHER): Payer: PPO | Admitting: Oncology

## 2020-03-24 VITALS — BP 126/68 | HR 71 | Temp 99.2°F | Resp 20 | Wt 244.8 lb

## 2020-03-24 DIAGNOSIS — C61 Malignant neoplasm of prostate: Secondary | ICD-10-CM | POA: Diagnosis not present

## 2020-03-24 LAB — CBC WITH DIFFERENTIAL/PLATELET
Abs Immature Granulocytes: 0.01 10*3/uL (ref 0.00–0.07)
Basophils Absolute: 0 10*3/uL (ref 0.0–0.1)
Basophils Relative: 1 %
Eosinophils Absolute: 0 10*3/uL (ref 0.0–0.5)
Eosinophils Relative: 2 %
HCT: 37.2 % — ABNORMAL LOW (ref 39.0–52.0)
Hemoglobin: 12.6 g/dL — ABNORMAL LOW (ref 13.0–17.0)
Immature Granulocytes: 1 %
Lymphocytes Relative: 22 %
Lymphs Abs: 0.4 10*3/uL — ABNORMAL LOW (ref 0.7–4.0)
MCH: 33.8 pg (ref 26.0–34.0)
MCHC: 33.9 g/dL (ref 30.0–36.0)
MCV: 99.7 fL (ref 80.0–100.0)
Monocytes Absolute: 0.2 10*3/uL (ref 0.1–1.0)
Monocytes Relative: 9 %
Neutro Abs: 1.2 10*3/uL — ABNORMAL LOW (ref 1.7–7.7)
Neutrophils Relative %: 65 %
Platelets: 42 10*3/uL — ABNORMAL LOW (ref 150–400)
RBC: 3.73 MIL/uL — ABNORMAL LOW (ref 4.22–5.81)
RDW: 14.4 % (ref 11.5–15.5)
WBC: 1.8 10*3/uL — ABNORMAL LOW (ref 4.0–10.5)
nRBC: 0 % (ref 0.0–0.2)

## 2020-03-24 LAB — BASIC METABOLIC PANEL
Anion gap: 5 (ref 5–15)
BUN: 17 mg/dL (ref 8–23)
CO2: 26 mmol/L (ref 22–32)
Calcium: 8.1 mg/dL — ABNORMAL LOW (ref 8.9–10.3)
Chloride: 111 mmol/L (ref 98–111)
Creatinine, Ser: 0.6 mg/dL — ABNORMAL LOW (ref 0.61–1.24)
GFR calc Af Amer: >60 mL/min (ref >60)
GFR calc non Af Amer: >60 mL/min (ref >60)
Glucose, Bld: 104 mg/dL — ABNORMAL HIGH (ref 70–99)
Potassium: 4.4 mmol/L (ref 3.5–5.1)
Sodium: 142 mmol/L (ref 135–145)

## 2020-03-24 LAB — VITAMIN D 25 HYDROXY (VIT D DEFICIENCY, FRACTURES): Vit D, 25-Hydroxy: 7.43 ng/mL — ABNORMAL LOW (ref 30–100)

## 2020-03-24 LAB — PSA: Prostatic Specific Antigen: 6.38 ng/mL — ABNORMAL HIGH (ref 0.00–4.00)

## 2020-03-24 MED ORDER — DENOSUMAB 120 MG/1.7ML ~~LOC~~ SOLN: 120.0000 mg | Freq: Once | SUBCUTANEOUS | Status: DC

## 2020-03-24 MED ORDER — DENOSUMAB 120 MG/1.7ML ~~LOC~~ SOLN
120.0000 mg | Freq: Once | SUBCUTANEOUS | Status: AC
  Administered 2020-03-24: 120 mg via SUBCUTANEOUS
  Filled 2020-03-24: qty 1.7

## 2020-03-24 NOTE — Progress Notes (Signed)
Patient states he is dealing with some pain in ankles attributed to circulation problems. He states it is getting more difficult for him to get around and do things that he needs to do. He denies any other questions at this time.

## 2020-03-24 NOTE — Progress Notes (Signed)
Ca =8.1,  Give xgeva per MD.

## 2020-03-27 ENCOUNTER — Telehealth: Payer: Self-pay | Admitting: Nurse Practitioner

## 2020-03-27 DIAGNOSIS — E559 Vitamin D deficiency, unspecified: Secondary | ICD-10-CM | POA: Insufficient documentation

## 2020-03-27 NOTE — Telephone Encounter (Signed)
Vitamin D low. Discussed with patient and recommended he start taking vitamin d3 800 iu daily.

## 2020-04-16 NOTE — Progress Notes (Signed)
Earl Ramirez  [phone] [phone]) [phone]  ID: Earl Ramirez OB: 1940-12-22  MR#: [id number]  AQT#:[id number]  Patient Care Team: Baxter Hire, MD as PCP - General (Internal Medicine) Murrell Redden, MD (Urology) [name] Javier Docker, MD (Cardiology) Lloyd Huger, MD as Consulting Physician (Oncology)  CHIEF COMPLAINT: Stage IVB prostate cancer.  INTERVAL HISTORY: Patient returns to clinic today for further evaluation and continuation of Xgeva.  He continues to feel well and remains asymptomatic.  He has no neurologic complaints.  He denies any recent fevers or illnesses.  He has a good appetite and denies weight loss. He denies any pain.  He has no chest pain, shortness of breath, cough, or hemoptysis.  He denies any nausea, vomiting, constipation, or diarrhea.  He has no urinary complaints.  Patient offers no specific complaints today.   REVIEW OF SYSTEMS:   Review of Systems  Constitutional: Negative.  Negative for fever, malaise/fatigue and weight loss.  Respiratory: Negative.  Negative for cough, hemoptysis and shortness of breath.   Cardiovascular: Negative.  Negative for chest pain and leg swelling.  Gastrointestinal: Negative.  Negative for abdominal pain and constipation.  Genitourinary: Negative.  Negative for frequency.  Musculoskeletal: Negative.  Negative for back pain and neck pain.  Skin: Negative.  Negative for rash.  Neurological: Negative.  Negative for dizziness, focal weakness, weakness and headaches.  Psychiatric/Behavioral: Negative.  The patient is not nervous/anxious.     As per HPI. Otherwise, a complete review of systems is negative.  PAST MEDICAL HISTORY: Past Medical History:  Diagnosis Date  . Aortic valve replaced    1998  . Arthritis   . Atrial fibrillation ([place])   . COPD (chronic obstructive pulmonary disease) ([place])   . Dysrhythmia    atrial fib  . Hernia of abdominal wall   . Hypertension   . Indigestion    . Neuropathy   . Neuropathy   . Prostate cancer ([place])   . Sleep apnea   . Thrombocytopenia ([place])   . Tremors of nervous system   . Urinary problem     PAST SURGICAL HISTORY: Past Surgical History:  Procedure Laterality Date  . CALDWELL LUC    . tongue      growth removed  . TRANSURETHRAL RESECTION OF PROSTATE N/A 01/18/2019   Procedure: CHANNEL TRANSURETHRAL RESECTION OF THE PROSTATE (TURP);  Surgeon: [name], MD;  Location: [facility] ORS;  Service: Urology;  Laterality: N/A;  . VALVE REPLACEMENT      FAMILY HISTORY: Family History  Problem Relation Age of Onset  . Heart attack Father   . Alcohol abuse Father   . Congestive Heart Failure Mother   . Alzheimer's disease Sister     ADVANCED DIRECTIVES (Y/N):  N  HEALTH MAINTENANCE: Social History   Tobacco Use  . Smoking status: Former Smoker    Types: Cigarettes    Quit date: 01/13/2001    Years since quitting: 19.2  . Smokeless tobacco: Former Systems developer    Quit date: 09/24/2000  Vaping Use  . Vaping Use: Never used  Substance Use Topics  . Alcohol use: Yes    Alcohol/week: 10.0 standard drinks    Types: 10 Cans of beer per week  . Drug use: No     Colonoscopy:  PAP:  Bone density:  Lipid panel:  Allergies  Allergen Reactions  . Oxycodone-Acetaminophen Nausea Only and Shortness Of Breath  . Amoxicillin Itching    Hand itching  . Penicillins Rash  Did it involve swelling of the face/tongue/throat, SOB, or low BP? No Did it involve sudden or severe rash/hives, skin peeling, or any reaction on the inside of your mouth or nose? No Did you need to seek medical attention at a hospital or doctor's office? No When did it last happen?4-5 years ago If all above answers are "NO", may proceed with cephalosporin use.     Current Outpatient Medications  Medication Sig Dispense Refill  . ALPRAZolam (XANAX) 0.25 MG tablet Take 0.25 mg by mouth daily as needed for anxiety.     Marland Kitchen aspirin EC 81 MG tablet Take  81 mg by mouth daily.     . calcium carbonate (TUMS - DOSED IN MG ELEMENTAL CALCIUM) 500 MG chewable tablet Chew 1 tablet by mouth daily.     Marland Kitchen docusate sodium (COLACE) 100 MG capsule Take 1 capsule (100 mg total) by mouth 2 (two) times daily. 60 capsule 0  . gabapentin (NEURONTIN) 300 MG capsule Take 900 mg by mouth 4 (four) times daily.     . metoprolol succinate (TOPROL-XL) 25 MG 24 hr tablet Take 25 mg by mouth daily.     . tamsulosin (FLOMAX) 0.4 MG CAPS capsule Take 1 capsule by mouth twice daily 90 capsule 3   No current facility-administered medications for this visit.    OBJECTIVE: Vitals:   04/21/20 1411  BP: (!) 128/57  Pulse: 77  Resp: 20  Temp: 98.6 F (37 C)  SpO2: 94%     Body mass index is 37.98 kg/m.    ECOG FS:0 - Asymptomatic  General: Well-developed, well-nourished, no acute distress. Eyes: Pink conjunctiva, anicteric sclera. HEENT: Normocephalic, moist mucous membranes. Lungs: No audible wheezing or coughing. Heart: Regular rate and rhythm. Abdomen: Soft, nontender, no obvious distention. Musculoskeletal: No edema, cyanosis, or clubbing. Neuro: Alert, answering all questions appropriately. Cranial nerves grossly intact. Skin: No rashes or petechiae noted. Psych: Normal affect.   LAB RESULTS:  Lab Results  Component Value Date   NA 141 04/21/2020   K 4.3 04/21/2020   CL 107 04/21/2020   CO2 27 04/21/2020   GLUCOSE 101 (H) 04/21/2020   BUN 17 04/21/2020   CREATININE 0.69 04/21/2020   CALCIUM 8.5 (L) 04/21/2020   PROT 5.8 (L) 03/07/2020   ALBUMIN 3.7 03/07/2020   AST 38 03/07/2020   ALT 30 03/07/2020   ALKPHOS 47 03/07/2020   BILITOT 1.9 (H) 03/07/2020   GFRNONAA >60 04/21/2020   GFRAA >60 04/21/2020    Lab Results  Component Value Date   WBC 2.1 (L) 04/21/2020   NEUTROABS 1.4 (L) 04/21/2020   HGB 12.5 (L) 04/21/2020   HCT 37.0 (L) 04/21/2020   MCV 100.3 (H) 04/21/2020   PLT 39 (L) 04/21/2020     STUDIES: No results  found.  ASSESSMENT: Stage IVB prostate cancer  PLAN:    1.  Stage IVB prostate cancer: Patient initially received XRT for local control disease in 2016. His PSA initially trended down to 5.37, but now is trending up slightly and is 7.25.  [facility] PET scan on October 05, 2019 reviewed independently with multiple bony lesions noted.  Patient received his first dose of Firmagon on October 11, 2019.  He will also benefit from [facility] for his bony lesions.  Will not initiate Zytiga or Xtandi at this time given his minimal tumor burden, but will consider in the near future given his slowly trending up PSA.  He last received Eligard on February 18, 2020.  Proceed with  Xgeva today.  Given the financial burden of treatment, patient wishes to change evaluation and treatment to every other month.  Return to clinic in 8 weeks for further evaluation and continuation of treatment.   2.  Pancytopenia: Chronic and unchanged.  Continue to monitor closely.  Can consider bone marrow biopsy in the future if necessary. 3.  Hypocalcemia: Chronic and unchanged.  Proceed with Xgeva as above.  Continue oral calcium supplementation.  I spent a total of 30 minutes reviewing chart data, face-to-face evaluation with the patient, counseling and coordination of care as detailed above.    Patient expressed understanding and was in agreement with this plan. He also understands that He can call clinic at any time with any questions, concerns, or complaints.   Cancer Staging Prostate cancer [facility]) Staging form: Prostate, AJCC 8th Edition - Clinical: Stage IVB (cTX, cN0, pM1b, PSA: 7.6) - Signed by Lloyd Huger, MD on 10/14/2019   Lloyd Huger, MD   04/23/2020 7:40 AM

## 2020-04-17 ENCOUNTER — Encounter (INDEPENDENT_AMBULATORY_CARE_PROVIDER_SITE_OTHER): Payer: Self-pay | Admitting: Vascular Surgery

## 2020-04-17 NOTE — Progress Notes (Signed)
04/18/2020 5:05 PM   Earl Ramirez 21-Feb-1941 [id number]  Referring provider: Baxter Hire, MD [place],  [place] [address] Chief Complaint  Patient presents with  . Benign Prostatic Hypertrophy    HPI: Earl Ramirez is a 79 y.o. male who returns for a 6 month follow up of metastatic prostate cancer with 2 bony lesions, presumably castrate sensitive.  He returns today primarily for a recheck of his urinary symptoms.   Most recently, his PSA has been rising steadily. Visit on 04/2019, his PSA had risen to 4.6 just barely meeting that criteria for biochemical recurrence. PSA was 6.38 on 03/24/20.  He is currently on ADT and Xgeva managed by Dr. [name].  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   Stable nodule on lungs.  Patient underwent channel TURP and urethral dilation on 01/2019. He had rUTI and obstructive urinary symptoms at that time.   He has done well since procedure until about 1 month ago.  He reports that since then, he has developed some dark cloudy your urine.  He also has increased daytime frequency and nocturia x1.  He is previously not getting up at night whatsoever.  PVR today is 0.  He is chronically on Flomax.  PSA trend: Component     Latest Ref Rng & Units 10/22/2019 11/18/2019 12/20/2019 01/21/2020  Prostatic Specific Antigen     0.00 - 4.00 ng/mL 6.10 (H) 5.60 (H) 5.80 (H) 5.37 (H)   Component     Latest Ref Rng & Units 03/07/2020 03/24/2020  Prostatic Specific Antigen     0.00 - 4.00 ng/mL 6.20 (H) 6.38 (H)      PMH: Past Medical History:  Diagnosis Date  . Aortic valve replaced    1998  . Arthritis   . Atrial fibrillation ([place])   . COPD (chronic obstructive pulmonary disease) ([place])   . Dysrhythmia    atrial fib  . Hernia of abdominal wall   . Hypertension   . Indigestion   . Neuropathy   . Neuropathy   . Prostate cancer ([place])   . Sleep apnea     . Thrombocytopenia ([place])   . Tremors of nervous system   . Urinary problem     Surgical History: Past Surgical History:  Procedure Laterality Date  . CALDWELL LUC    . tongue      growth removed  . TRANSURETHRAL RESECTION OF PROSTATE N/A 01/18/2019   Procedure: CHANNEL TRANSURETHRAL RESECTION OF THE PROSTATE (TURP);  Surgeon: [name], MD;  Location: [facility] ORS;  Service: Urology;  Laterality: N/A;  . VALVE REPLACEMENT      Home Medications:  Allergies as of 04/18/2020      Reactions   Oxycodone-acetaminophen Nausea Only, Shortness Of Breath   Amoxicillin Itching   Hand itching   Penicillins Rash   Did it involve swelling of the face/tongue/throat, SOB, or low BP? No Did it involve sudden or severe rash/hives, skin peeling, or any reaction on the inside of your mouth or nose? No Did you need to seek medical attention at a hospital or doctor's office? No When did it last happen?4-5 years ago If all above answers are "NO", may proceed with cephalosporin use.      Medication List       Accurate as of April 18, 2020  5:05 PM. If you have any questions, ask your nurse or doctor.  STOP taking these medications   HYDROcodone-acetaminophen 5-325 MG tablet Commonly known as: NORCO/VICODIN Stopped by: [name], MD     TAKE these medications   ALPRAZolam 0.25 MG tablet Commonly known as: XANAX Take 0.25 mg by mouth daily as needed for anxiety.   aspirin EC 81 MG tablet Take 81 mg by mouth daily.   calcium carbonate 500 MG chewable tablet Commonly known as: TUMS - dosed in mg elemental calcium Chew 1 tablet by mouth daily.   docusate sodium 100 MG capsule Commonly known as: COLACE Take 1 capsule (100 mg total) by mouth 2 (two) times daily.   gabapentin 300 MG capsule Commonly known as: NEURONTIN Take 900 mg by mouth 4 (four) times daily.   metoprolol succinate 25 MG 24 hr tablet Commonly known as: TOPROL-XL Take 25 mg by mouth daily.    tamsulosin 0.4 MG Caps capsule Commonly known as: FLOMAX Take 1 capsule by mouth twice daily       Allergies:  Allergies  Allergen Reactions  . Oxycodone-Acetaminophen Nausea Only and Shortness Of Breath  . Amoxicillin Itching    Hand itching  . Penicillins Rash    Did it involve swelling of the face/tongue/throat, SOB, or low BP? No Did it involve sudden or severe rash/hives, skin peeling, or any reaction on the inside of your mouth or nose? No Did you need to seek medical attention at a hospital or doctor's office? No When did it last happen?4-5 years ago If all above answers are "NO", may proceed with cephalosporin use.     Family History: Family History  Problem Relation Age of Onset  . Heart attack Father   . Alcohol abuse Father   . Congestive Heart Failure Mother   . Alzheimer's disease Sister     Social History:  reports that he quit smoking about 19 years ago. His smoking use included cigarettes. He quit smokeless tobacco use about 19 years ago. He reports current alcohol use of about 10.0 standard drinks of alcohol per week. He reports that he does not use drugs.   Physical Exam: BP (!) 148/77   Pulse 88   Wt 244 lb (110.7 kg)   BMI 38.22 kg/m   Constitutional:  Alert and oriented, No acute distress. HEENT: [place] AT, moist mucus membranes.  Trachea midline, no masses. Cardiovascular: No clubbing, cyanosis, or edema. Respiratory: Normal respiratory effort, no increased work of breathing. Skin: No rashes, bruises or suspicious lesions. Neurologic: Grossly intact, no focal deficits, moving all 4 extremities. Psychiatric: Normal mood and affect.  Laboratory Data:  Lab Results  Component Value Date   CREATININE 0.60 (L) 03/24/2020    Urinalysis >30 WBC, 11-30 RBC, nitrite negative, few bacteria  Pertinent image Results for orders placed or performed in visit on 04/18/20  Microscopic Examination   Urine  Result Value Ref Range   WBC, UA >30 (A)  0 - 5 /hpf   RBC 11-30 (A) 0 - 2 /hpf   Epithelial Cells (non renal) 0-10 0 - 10 /hpf   Bacteria, UA Few None seen/Few  Urinalysis, Complete  Result Value Ref Range   Specific Gravity, UA 1.020 1.005 - 1.030   pH, UA 5.5 5.0 - 7.5   Color, UA Yellow Yellow   Appearance Ur Cloudy (A) Clear   Leukocytes,UA 1+ (A) Negative   Protein,UA 1+ (A) Negative/Trace   Glucose, UA Negative Negative   Ketones, UA Negative Negative   RBC, UA 2+ (A) Negative   Bilirubin, UA Negative  Negative   Urobilinogen, Ur 0.2 0.2 - 1.0 mg/dL   Nitrite, UA Negative Negative   Microscopic Examination See below:   BLADDER SCAN AMB NON-IMAGING  Result Value Ref Range   Scan Result 0 ML      Assessment & Plan:    1. Metastatic prostate cancer with 2 bony lesions, presumably castrate sensitive Patient is managed by Dr. [name] He is on ADT and Xgeva PSA continues to rise slowly.  This point time, suspect he may be becoming castrate resistant as I would have expected his PSA to suppress completely.  There may be a role for adding an additional oral antiandrogen medication such as abiraterone, etc. but will defer this to Dr. [name]  2. Pulmonary nodule Stable likely benign Followed by Dr. [name]  3. History of MDS luekopenia and thrombocytopenia Followed by Dr. [name]   4. Acute cystitis S/p channel TURP and urethral dilation 01/2019 UA shows >30 WBC, 11-30 RBC, nitrite negative, few bacteria concerning for recurrent infection Given the symptoms are fairly mild, will hold off on treating until we have culture data to guide antibiotics Given his history of recurrent infections in the remote past, its possible that he is redeveloped a stricture as he did well for over a year after this was treated now with recurrent symptoms If his symptoms fail to improve, would recommend office cystoscopy to rule this out If he continues to have recurrent infections, may benefit from some suppression antibiotics as  this is also helped in the past -Urine culture sent.   RTC in 6 months for symptom recheck.  Return in about 6 months (around 10/16/2020) for 17mo w/UA PVR IPSS.  [facility] [address], [place], [place] [address] [phone]  I, Selena Batten, am acting as a scribe for Dr. [name].  I have reviewed the above documentation for accuracy and completeness, and I agree with the above.   [name], MD

## 2020-04-18 ENCOUNTER — Other Ambulatory Visit: Payer: Self-pay

## 2020-04-18 ENCOUNTER — Ambulatory Visit: Payer: PPO | Admitting: Urology

## 2020-04-18 VITALS — BP 148/77 | HR 88 | Wt 244.0 lb

## 2020-04-18 DIAGNOSIS — C61 Malignant neoplasm of prostate: Secondary | ICD-10-CM | POA: Diagnosis not present

## 2020-04-18 DIAGNOSIS — R350 Frequency of micturition: Secondary | ICD-10-CM | POA: Diagnosis not present

## 2020-04-18 DIAGNOSIS — N390 Urinary tract infection, site not specified: Secondary | ICD-10-CM

## 2020-04-18 LAB — URINALYSIS, COMPLETE
Bilirubin, UA: NEGATIVE
Glucose, UA: NEGATIVE
Ketones, UA: NEGATIVE
Nitrite, UA: NEGATIVE
Specific Gravity, UA: 1.02 (ref 1.005–1.030)
Urobilinogen, Ur: 0.2 mg/dL (ref 0.2–1.0)
pH, UA: 5.5 (ref 5.0–7.5)

## 2020-04-18 LAB — MICROSCOPIC EXAMINATION: WBC, UA: >30 /hpf — AB (ref 0–5)

## 2020-04-18 LAB — BLADDER SCAN AMB NON-IMAGING: Scan Result: 0

## 2020-04-21 ENCOUNTER — Encounter: Payer: Self-pay | Admitting: Oncology

## 2020-04-21 ENCOUNTER — Inpatient Hospital Stay: Payer: PPO

## 2020-04-21 ENCOUNTER — Other Ambulatory Visit: Payer: Self-pay

## 2020-04-21 ENCOUNTER — Inpatient Hospital Stay (HOSPITAL_BASED_OUTPATIENT_CLINIC_OR_DEPARTMENT_OTHER): Payer: PPO | Admitting: Oncology

## 2020-04-21 ENCOUNTER — Inpatient Hospital Stay: Payer: PPO | Attending: Oncology

## 2020-04-21 VITALS — BP 128/57 | HR 77 | Temp 98.6°F | Resp 20 | Wt 242.5 lb

## 2020-04-21 DIAGNOSIS — G629 Polyneuropathy, unspecified: Secondary | ICD-10-CM | POA: Insufficient documentation

## 2020-04-21 DIAGNOSIS — G4730 Sleep apnea, unspecified: Secondary | ICD-10-CM | POA: Insufficient documentation

## 2020-04-21 DIAGNOSIS — C61 Malignant neoplasm of prostate: Secondary | ICD-10-CM

## 2020-04-21 DIAGNOSIS — Z79899 Other long term (current) drug therapy: Secondary | ICD-10-CM | POA: Insufficient documentation

## 2020-04-21 DIAGNOSIS — Z87891 Personal history of nicotine dependence: Secondary | ICD-10-CM | POA: Insufficient documentation

## 2020-04-21 DIAGNOSIS — C7951 Secondary malignant neoplasm of bone: Secondary | ICD-10-CM | POA: Insufficient documentation

## 2020-04-21 DIAGNOSIS — J449 Chronic obstructive pulmonary disease, unspecified: Secondary | ICD-10-CM | POA: Diagnosis not present

## 2020-04-21 DIAGNOSIS — D61818 Other pancytopenia: Secondary | ICD-10-CM | POA: Insufficient documentation

## 2020-04-21 DIAGNOSIS — I10 Essential (primary) hypertension: Secondary | ICD-10-CM | POA: Diagnosis not present

## 2020-04-21 DIAGNOSIS — I4891 Unspecified atrial fibrillation: Secondary | ICD-10-CM | POA: Insufficient documentation

## 2020-04-21 DIAGNOSIS — Z7982 Long term (current) use of aspirin: Secondary | ICD-10-CM | POA: Diagnosis not present

## 2020-04-21 DIAGNOSIS — M129 Arthropathy, unspecified: Secondary | ICD-10-CM | POA: Diagnosis not present

## 2020-04-21 LAB — BASIC METABOLIC PANEL
Anion gap: 7 (ref 5–15)
BUN: 17 mg/dL (ref 8–23)
CO2: 27 mmol/L (ref 22–32)
Calcium: 8.5 mg/dL — ABNORMAL LOW (ref 8.9–10.3)
Chloride: 107 mmol/L (ref 98–111)
Creatinine, Ser: 0.69 mg/dL (ref 0.61–1.24)
GFR calc Af Amer: >60 mL/min (ref >60)
GFR calc non Af Amer: >60 mL/min (ref >60)
Glucose, Bld: 101 mg/dL — ABNORMAL HIGH (ref 70–99)
Potassium: 4.3 mmol/L (ref 3.5–5.1)
Sodium: 141 mmol/L (ref 135–145)

## 2020-04-21 LAB — PSA: Prostatic Specific Antigen: 7.25 ng/mL — ABNORMAL HIGH (ref 0.00–4.00)

## 2020-04-21 LAB — CBC WITH DIFFERENTIAL/PLATELET
Abs Immature Granulocytes: 0.01 10*3/uL (ref 0.00–0.07)
Basophils Absolute: 0 10*3/uL (ref 0.0–0.1)
Basophils Relative: 1 %
Eosinophils Absolute: 0.1 10*3/uL (ref 0.0–0.5)
Eosinophils Relative: 3 %
HCT: 37 % — ABNORMAL LOW (ref 39.0–52.0)
Hemoglobin: 12.5 g/dL — ABNORMAL LOW (ref 13.0–17.0)
Immature Granulocytes: 1 %
Lymphocytes Relative: 21 %
Lymphs Abs: 0.4 10*3/uL — ABNORMAL LOW (ref 0.7–4.0)
MCH: 33.9 pg (ref 26.0–34.0)
MCHC: 33.8 g/dL (ref 30.0–36.0)
MCV: 100.3 fL — ABNORMAL HIGH (ref 80.0–100.0)
Monocytes Absolute: 0.2 10*3/uL (ref 0.1–1.0)
Monocytes Relative: 9 %
Neutro Abs: 1.4 10*3/uL — ABNORMAL LOW (ref 1.7–7.7)
Neutrophils Relative %: 65 %
Platelets: 39 10*3/uL — ABNORMAL LOW (ref 150–400)
RBC: 3.69 MIL/uL — ABNORMAL LOW (ref 4.22–5.81)
RDW: 13.8 % (ref 11.5–15.5)
WBC: 2.1 10*3/uL — ABNORMAL LOW (ref 4.0–10.5)
nRBC: 0 % (ref 0.0–0.2)

## 2020-04-21 MED ORDER — DENOSUMAB 120 MG/1.7ML ~~LOC~~ SOLN
120.0000 mg | Freq: Once | SUBCUTANEOUS | Status: AC
  Administered 2020-04-21: 120 mg via SUBCUTANEOUS
  Filled 2020-04-21: qty 1.7

## 2020-04-21 NOTE — Progress Notes (Signed)
Patient here today for follow up. States he has a small bruise under neck on mid upper chest. Was concerned it may be a tick bite. In looking at area did not observe any signs of tick. Denies area hurts, itches, or bothers him. Would like provider to look at area.

## 2020-04-22 LAB — CULTURE, URINE COMPREHENSIVE

## 2020-04-24 ENCOUNTER — Telehealth: Payer: Self-pay

## 2020-04-24 MED ORDER — SULFAMETHOXAZOLE-TRIMETHOPRIM 800-160 MG PO TABS: 1.0000 | ORAL_TABLET | Freq: Two times a day (BID) | ORAL | 0 refills | Status: DC

## 2020-04-24 NOTE — Telephone Encounter (Signed)
Spoke with patient he states he is still having symptoms. Sent antibiotics to pharmacy advised pt if still having symptoms will have to come in for cysto. Pt aware and verbalized understanding.

## 2020-04-24 NOTE — Telephone Encounter (Signed)
-----   Message from [name], MD sent at 04/24/2020  8:46 AM EDT ----- Urine culture was negative.  Can you please see if his symptoms are improving.  If not, treat with bactrim ds bid x7 days empirically.  If his symptoms still after abx, I would like for him to come back for cystoscopy.  [name], MD

## 2020-05-16 DIAGNOSIS — I359 Nonrheumatic aortic valve disorder, unspecified: Secondary | ICD-10-CM | POA: Diagnosis not present

## 2020-05-16 DIAGNOSIS — I872 Venous insufficiency (chronic) (peripheral): Secondary | ICD-10-CM | POA: Diagnosis not present

## 2020-05-16 DIAGNOSIS — D61818 Other pancytopenia: Secondary | ICD-10-CM | POA: Diagnosis not present

## 2020-05-16 DIAGNOSIS — G4730 Sleep apnea, unspecified: Secondary | ICD-10-CM | POA: Diagnosis not present

## 2020-05-16 DIAGNOSIS — C61 Malignant neoplasm of prostate: Secondary | ICD-10-CM | POA: Diagnosis not present

## 2020-05-16 DIAGNOSIS — J42 Unspecified chronic bronchitis: Secondary | ICD-10-CM | POA: Diagnosis not present

## 2020-05-16 DIAGNOSIS — I4892 Unspecified atrial flutter: Secondary | ICD-10-CM | POA: Diagnosis not present

## 2020-05-16 DIAGNOSIS — I890 Lymphedema, not elsewhere classified: Secondary | ICD-10-CM | POA: Diagnosis not present

## 2020-05-16 DIAGNOSIS — I739 Peripheral vascular disease, unspecified: Secondary | ICD-10-CM | POA: Diagnosis not present

## 2020-05-16 DIAGNOSIS — R600 Localized edema: Secondary | ICD-10-CM | POA: Diagnosis not present

## 2020-05-19 ENCOUNTER — Ambulatory Visit: Payer: PPO | Admitting: Oncology

## 2020-05-19 ENCOUNTER — Other Ambulatory Visit (INDEPENDENT_AMBULATORY_CARE_PROVIDER_SITE_OTHER): Payer: Self-pay | Admitting: Vascular Surgery

## 2020-05-19 ENCOUNTER — Other Ambulatory Visit: Payer: PPO

## 2020-05-19 ENCOUNTER — Ambulatory Visit: Payer: PPO

## 2020-05-19 DIAGNOSIS — I872 Venous insufficiency (chronic) (peripheral): Secondary | ICD-10-CM

## 2020-05-19 DIAGNOSIS — I739 Peripheral vascular disease, unspecified: Secondary | ICD-10-CM

## 2020-05-22 ENCOUNTER — Other Ambulatory Visit: Payer: Self-pay

## 2020-05-22 ENCOUNTER — Ambulatory Visit (INDEPENDENT_AMBULATORY_CARE_PROVIDER_SITE_OTHER): Payer: PPO | Admitting: Vascular Surgery

## 2020-05-22 ENCOUNTER — Ambulatory Visit (INDEPENDENT_AMBULATORY_CARE_PROVIDER_SITE_OTHER): Payer: PPO

## 2020-05-22 ENCOUNTER — Encounter (INDEPENDENT_AMBULATORY_CARE_PROVIDER_SITE_OTHER): Payer: Self-pay | Admitting: Vascular Surgery

## 2020-05-22 VITALS — BP 132/72 | HR 94 | Resp 16 | Wt 249.0 lb

## 2020-05-22 DIAGNOSIS — I739 Peripheral vascular disease, unspecified: Secondary | ICD-10-CM | POA: Diagnosis not present

## 2020-05-22 DIAGNOSIS — I4892 Unspecified atrial flutter: Secondary | ICD-10-CM | POA: Diagnosis not present

## 2020-05-22 DIAGNOSIS — J449 Chronic obstructive pulmonary disease, unspecified: Secondary | ICD-10-CM | POA: Diagnosis not present

## 2020-05-22 DIAGNOSIS — I890 Lymphedema, not elsewhere classified: Secondary | ICD-10-CM

## 2020-05-22 DIAGNOSIS — I872 Venous insufficiency (chronic) (peripheral): Secondary | ICD-10-CM

## 2020-05-22 NOTE — Progress Notes (Signed)
MRN : [id number]  Earl Ramirez is a 79 y.o. (Dec 03, 1940) male who presents with chief complaint of  Chief Complaint  Patient presents with  . Follow-up    ultrasound follwo up  .  History of Present Illness:  The patient returns to the office for followup evaluation regarding leg swelling.  The swelling has persisted and the pain associated with swelling continues. There have not been any interval development of a ulcerations or wounds.  Since the previous visit the patient has been wearing graduated compression stockings and has noted little if any improvement in the lymphedema. The patient has been using compression routinely morning until night.  The patient also states elevation during the day and exercise is being done too.   Current Meds  Medication Sig  . ALPRAZolam (XANAX) 0.25 MG tablet Take 0.25 mg by mouth daily as needed for anxiety.   Marland Kitchen aspirin EC 81 MG tablet Take 81 mg by mouth daily.   . calcium carbonate (TUMS - DOSED IN MG ELEMENTAL CALCIUM) 500 MG chewable tablet Chew 1 tablet by mouth daily.   Marland Kitchen docusate sodium (COLACE) 100 MG capsule Take 1 capsule (100 mg total) by mouth 2 (two) times daily.  . furosemide (LASIX) 20 MG tablet Take 20 mg by mouth daily.  Marland Kitchen gabapentin (NEURONTIN) 300 MG capsule Take 900 mg by mouth 4 (four) times daily.   . metoprolol succinate (TOPROL-XL) 25 MG 24 hr tablet Take 25 mg by mouth daily.   . tamsulosin (FLOMAX) 0.4 MG CAPS capsule Take 1 capsule by mouth twice daily    Past Medical History:  Diagnosis Date  . Aortic valve replaced    1998  . Arthritis   . Atrial fibrillation ([place])   . COPD (chronic obstructive pulmonary disease) ([place])   . Dysrhythmia    atrial fib  . Hernia of abdominal wall   . Hypertension   . Indigestion   . Neuropathy   . Neuropathy   . Prostate cancer ([place])   . Sleep apnea   . Thrombocytopenia ([place])   . Tremors of nervous system   . Urinary problem     Past Surgical History:  Procedure  Laterality Date  . CALDWELL LUC    . tongue      growth removed  . TRANSURETHRAL RESECTION OF PROSTATE N/A 01/18/2019   Procedure: CHANNEL TRANSURETHRAL RESECTION OF THE PROSTATE (TURP);  Surgeon: [name], MD;  Location: [facility] ORS;  Service: Urology;  Laterality: N/A;  . VALVE REPLACEMENT      Social History Social History   Tobacco Use  . Smoking status: Former Smoker    Types: Cigarettes    Quit date: 01/13/2001    Years since quitting: 19.3  . Smokeless tobacco: Former Systems developer    Quit date: 09/24/2000  Vaping Use  . Vaping Use: Never used  Substance Use Topics  . Alcohol use: Yes    Alcohol/week: 10.0 standard drinks    Types: 10 Cans of beer per week  . Drug use: No    Family History Family History  Problem Relation Age of Onset  . Heart attack Father   . Alcohol abuse Father   . Congestive Heart Failure Mother   . Alzheimer's disease Sister     Allergies  Allergen Reactions  . Oxycodone-Acetaminophen Nausea Only and Shortness Of Breath  . Amoxicillin Itching    Hand itching  . Penicillins Rash    Did it involve swelling of the face/tongue/throat, SOB, or  low BP? No Did it involve sudden or severe rash/hives, skin peeling, or any reaction on the inside of your mouth or nose? No Did you need to seek medical attention at a hospital or doctor's office? No When did it last happen?4-5 years ago If all above answers are "NO", may proceed with cephalosporin use.      REVIEW OF SYSTEMS (Negative unless checked)  Constitutional: [] Weight loss  [] Fever  [] Chills Cardiac: [] Chest pain   [] Chest pressure   [] Palpitations   [] Shortness of breath when laying flat   [] Shortness of breath with exertion. Vascular:  [] Pain in legs with walking   [x] Pain in legs at rest  [] History of DVT   [] Phlebitis   [x] Swelling in legs   [] Varicose veins   [] Non-healing ulcers Pulmonary:   [] Uses home oxygen   [] Productive cough   [] Hemoptysis   [] Wheeze  [] COPD    [] Asthma Neurologic:  [] Dizziness   [] Seizures   [] History of stroke   [] History of TIA  [] Aphasia   [] Vissual changes   [] Weakness or numbness in arm   [] Weakness or numbness in leg Musculoskeletal:   [] Joint swelling   [] Joint pain   [] Low back pain Hematologic:  [] Easy bruising  [] Easy bleeding   [] Hypercoagulable state   [] Anemic Gastrointestinal:  [] Diarrhea   [] Vomiting  [] Gastroesophageal reflux/heartburn   [] Difficulty swallowing. Genitourinary:  [] Chronic kidney disease   [] Difficult urination  [] Frequent urination   [] Blood in urine Skin:  [] Rashes   [] Ulcers  Psychological:  [] History of anxiety   []  History of major depression.  Physical Examination  Vitals:   05/22/20 1351  BP: 132/72  Pulse: 94  Resp: 16  Weight: 249 lb (112.9 kg)   Body mass index is 39 kg/m. Gen: WD/WN, NAD Head: [place]/AT, No temporalis wasting.  Ear/Nose/Throat: Hearing grossly intact, nares w/o erythema or drainage Eyes: PER, EOMI, sclera nonicteric.  Neck: Supple, no large masses.   Pulmonary:  Good air movement, no audible wheezing bilaterally, no use of accessory muscles.  Cardiac: RRR, no JVD Vascular: scattered varicosities present bilaterally.  Moderate venous stasis changes to the legs bilaterally.  3-4+ soft pitting edema Vessel Right Left  Radial Palpable Palpable  Gastrointestinal: Non-distended. No guarding/no peritoneal signs.  Musculoskeletal: M/S 5/5 throughout.  No deformity or atrophy.  Neurologic: CN 2-12 intact. Symmetrical.  Speech is fluent. Motor exam as listed above. Psychiatric: Judgment intact, Mood & affect appropriate for pt's clinical situation. Dermatologic: Moderate venous rashes no ulcers noted.  No changes consistent with cellulitis. Lymph : No lichenification or skin changes of chronic lymphedema.  CBC Lab Results  Component Value Date   WBC 2.1 (L) 04/21/2020   HGB 12.5 (L) 04/21/2020   HCT 37.0 (L) 04/21/2020   MCV 100.3 (H) 04/21/2020   PLT 39 (L)  04/21/2020    BMET    Component Value Date/Time   NA 141 04/21/2020 1357   K 4.3 04/21/2020 1357   CL 107 04/21/2020 1357   CO2 27 04/21/2020 1357   GLUCOSE 101 (H) 04/21/2020 1357   BUN 17 04/21/2020 1357   CREATININE 0.69 04/21/2020 1357   CALCIUM 8.5 (L) 04/21/2020 1357   GFRNONAA >60 04/21/2020 1357   GFRAA >60 04/21/2020 1357   CrCl cannot be calculated (Patient's most recent lab result is older than the maximum 21 days allowed.).  COAG No results found for: INR, PROTIME  Radiology No results found.   Assessment/Plan 1. Lymphedema Recommend:  No surgery or intervention at this point  in time.    I have reviewed my previous discussion with the patient regarding swelling and why it causes symptoms.  Patient will continue wearing graduated compression stockings class 1 (20-30 mmHg) on a daily basis. The patient will  beginning wearing the stockings first thing in the morning and removing them in the evening. The patient is instructed specifically not to sleep in the stockings.    In addition, behavioral modification including several periods of elevation of the lower extremities during the day will be continued.  This was reviewed with the patient during the initial visit.  The patient will also continue routine exercise, especially walking on a daily basis as was discussed during the initial visit.    Despite conservative treatments including graduated compression therapy class 1 and behavioral modification including exercise and elevation the patient  has not obtained adequate control of the lymphedema.  The patient still has stage 3 lymphedema and therefore, I believe that a lymph pump should be added to improve the control of the patient's lymphedema.  Additionally, a lymph pump is warranted because it will reduce the risk of cellulitis and ulceration in the future.  Patient should follow-up in six months    2. Chronic venous insufficiency Recommend:  No surgery or  intervention at this point in time.    I have reviewed my previous discussion with the patient regarding swelling and why it causes symptoms.  Patient will continue wearing graduated compression stockings class 1 (20-30 mmHg) on a daily basis. The patient will  beginning wearing the stockings first thing in the morning and removing them in the evening. The patient is instructed specifically not to sleep in the stockings.    In addition, behavioral modification including several periods of elevation of the lower extremities during the day will be continued.  This was reviewed with the patient during the initial visit.  The patient will also continue routine exercise, especially walking on a daily basis as was discussed during the initial visit.    Despite conservative treatments including graduated compression therapy class 1 and behavioral modification including exercise and elevation the patient  has not obtained adequate control of the lymphedema.  The patient still has stage 3 lymphedema and therefore, I believe that a lymph pump should be added to improve the control of the patient's lymphedema.  Additionally, a lymph pump is warranted because it will reduce the risk of cellulitis and ulceration in the future.  Patient should follow-up in six months    3. PAD (peripheral artery disease) (HCC) Recommend:  I do not find evidence of life style limiting vascular disease. The patient specifically denies life style limitation.  Previous noninvasive studies including ABI's of the legs do not identify critical vascular problems.  The patient should continue walking and begin a more formal exercise program. The patient should continue his antiplatelet therapy and aggressive treatment of the lipid abnormalities.  4. Chronic atrial flutter (HCC) Continue antiarrhythmia medications as already ordered, these medications have been reviewed and there are no changes at this time.  Continue  anticoagulation as ordered by Cardiology Service   5. Chronic obstructive pulmonary disease, unspecified COPD type ([place]) Continue pulmonary medications and aerosols as already ordered, these medications have been reviewed and there are no changes at this time.      Hortencia Pilar, MD  05/22/2020 2:13 PM

## 2020-05-28 ENCOUNTER — Encounter (INDEPENDENT_AMBULATORY_CARE_PROVIDER_SITE_OTHER): Payer: Self-pay | Admitting: Vascular Surgery

## 2020-06-05 DIAGNOSIS — G4733 Obstructive sleep apnea (adult) (pediatric): Secondary | ICD-10-CM | POA: Diagnosis not present

## 2020-06-06 ENCOUNTER — Other Ambulatory Visit: Payer: Self-pay | Admitting: Urology

## 2020-06-06 DIAGNOSIS — N401 Enlarged prostate with lower urinary tract symptoms: Secondary | ICD-10-CM

## 2020-06-10 DIAGNOSIS — G4733 Obstructive sleep apnea (adult) (pediatric): Secondary | ICD-10-CM | POA: Diagnosis not present

## 2020-06-12 ENCOUNTER — Ambulatory Visit: Payer: PPO | Admitting: Dermatology

## 2020-06-20 ENCOUNTER — Inpatient Hospital Stay: Payer: PPO

## 2020-06-20 ENCOUNTER — Inpatient Hospital Stay: Payer: PPO | Attending: Oncology | Admitting: Oncology

## 2020-06-20 ENCOUNTER — Encounter: Payer: Self-pay | Admitting: Oncology

## 2020-06-20 VITALS — BP 152/65 | HR 97 | Temp 97.6°F | Resp 20 | Wt 249.0 lb

## 2020-06-20 DIAGNOSIS — I10 Essential (primary) hypertension: Secondary | ICD-10-CM | POA: Insufficient documentation

## 2020-06-20 DIAGNOSIS — M129 Arthropathy, unspecified: Secondary | ICD-10-CM | POA: Insufficient documentation

## 2020-06-20 DIAGNOSIS — R269 Unspecified abnormalities of gait and mobility: Secondary | ICD-10-CM | POA: Insufficient documentation

## 2020-06-20 DIAGNOSIS — D61818 Other pancytopenia: Secondary | ICD-10-CM | POA: Diagnosis not present

## 2020-06-20 DIAGNOSIS — R531 Weakness: Secondary | ICD-10-CM | POA: Insufficient documentation

## 2020-06-20 DIAGNOSIS — F1010 Alcohol abuse, uncomplicated: Secondary | ICD-10-CM | POA: Diagnosis not present

## 2020-06-20 DIAGNOSIS — G629 Polyneuropathy, unspecified: Secondary | ICD-10-CM | POA: Insufficient documentation

## 2020-06-20 DIAGNOSIS — C61 Malignant neoplasm of prostate: Secondary | ICD-10-CM | POA: Diagnosis not present

## 2020-06-20 DIAGNOSIS — Z952 Presence of prosthetic heart valve: Secondary | ICD-10-CM | POA: Diagnosis not present

## 2020-06-20 DIAGNOSIS — Z79899 Other long term (current) drug therapy: Secondary | ICD-10-CM | POA: Diagnosis not present

## 2020-06-20 DIAGNOSIS — I4891 Unspecified atrial fibrillation: Secondary | ICD-10-CM | POA: Diagnosis not present

## 2020-06-20 DIAGNOSIS — J449 Chronic obstructive pulmonary disease, unspecified: Secondary | ICD-10-CM | POA: Insufficient documentation

## 2020-06-20 DIAGNOSIS — G4730 Sleep apnea, unspecified: Secondary | ICD-10-CM | POA: Insufficient documentation

## 2020-06-20 DIAGNOSIS — Z7982 Long term (current) use of aspirin: Secondary | ICD-10-CM | POA: Insufficient documentation

## 2020-06-20 DIAGNOSIS — Z87891 Personal history of nicotine dependence: Secondary | ICD-10-CM | POA: Diagnosis not present

## 2020-06-20 LAB — CBC WITH DIFFERENTIAL/PLATELET
Abs Immature Granulocytes: 0.01 10*3/uL (ref 0.00–0.07)
Basophils Absolute: 0 10*3/uL (ref 0.0–0.1)
Basophils Relative: 1 %
Eosinophils Absolute: 0 10*3/uL (ref 0.0–0.5)
Eosinophils Relative: 2 %
HCT: 38.7 % — ABNORMAL LOW (ref 39.0–52.0)
Hemoglobin: 13 g/dL (ref 13.0–17.0)
Immature Granulocytes: 1 %
Lymphocytes Relative: 21 %
Lymphs Abs: 0.5 10*3/uL — ABNORMAL LOW (ref 0.7–4.0)
MCH: 34.3 pg — ABNORMAL HIGH (ref 26.0–34.0)
MCHC: 33.6 g/dL (ref 30.0–36.0)
MCV: 102.1 fL — ABNORMAL HIGH (ref 80.0–100.0)
Monocytes Absolute: 0.2 10*3/uL (ref 0.1–1.0)
Monocytes Relative: 10 %
Neutro Abs: 1.4 10*3/uL — ABNORMAL LOW (ref 1.7–7.7)
Neutrophils Relative %: 65 %
Platelets: 46 10*3/uL — ABNORMAL LOW (ref 150–400)
RBC: 3.79 MIL/uL — ABNORMAL LOW (ref 4.22–5.81)
RDW: 13.8 % (ref 11.5–15.5)
WBC: 2.1 10*3/uL — ABNORMAL LOW (ref 4.0–10.5)
nRBC: 0 % (ref 0.0–0.2)

## 2020-06-20 LAB — BASIC METABOLIC PANEL
Anion gap: 7 (ref 5–15)
BUN: 20 mg/dL (ref 8–23)
CO2: 26 mmol/L (ref 22–32)
Calcium: 8.5 mg/dL — ABNORMAL LOW (ref 8.9–10.3)
Chloride: 108 mmol/L (ref 98–111)
Creatinine, Ser: 0.64 mg/dL (ref 0.61–1.24)
GFR, Estimated: >60 mL/min (ref >60)
Glucose, Bld: 110 mg/dL — ABNORMAL HIGH (ref 70–99)
Potassium: 4.1 mmol/L (ref 3.5–5.1)
Sodium: 141 mmol/L (ref 135–145)

## 2020-06-20 LAB — PSA: Prostatic Specific Antigen: 7.02 ng/mL — ABNORMAL HIGH (ref 0.00–4.00)

## 2020-06-20 MED ORDER — DENOSUMAB 120 MG/1.7ML ~~LOC~~ SOLN
120.0000 mg | Freq: Once | SUBCUTANEOUS | Status: AC
  Administered 2020-06-20: 120 mg via SUBCUTANEOUS
  Filled 2020-06-20: qty 1.7

## 2020-06-20 MED ORDER — DENOSUMAB 120 MG/1.7ML ~~LOC~~ SOLN: 120.0000 mg | Freq: Once | SUBCUTANEOUS | Status: DC

## 2020-06-20 NOTE — Progress Notes (Signed)
[place]  [phone] Fax:(336) [phone]  ID: Earl Ramirez OB: 26-Dec-1940  MR#: [id number]  LKG#:[id number]  Patient Care Team: Baxter Hire, MD as PCP - General (Internal Medicine) Murrell Redden, MD (Urology) [name] Javier Docker, MD (Cardiology) Lloyd Huger, MD as Consulting Physician (Oncology)  CHIEF COMPLAINT: Stage IVB prostate cancer.  INTERVAL HISTORY: Patient returns to clinic today for further evaluation and continuation of Xgeva.  He continues to feel well.  Endorses bilateral lower extremity weakness and trouble with his balance.  He uses a walker to ambulate at home.  He occasionally has to use the wall to steady himself.  He denies any recent falls. He has no neurologic complaints.  He denies any recent fevers or illnesses.  He has a good appetite and denies weight loss. He denies any pain.  He has no chest pain, shortness of breath, cough, or hemoptysis.  He denies any nausea, vomiting, constipation, or diarrhea.  He has no urinary complaints.  Patient offers no specific complaints today.     REVIEW OF SYSTEMS:   Review of Systems  Constitutional: Positive for malaise/fatigue. Negative for fever and weight loss.  Respiratory: Negative.  Negative for cough, hemoptysis and shortness of breath.   Cardiovascular: Negative.  Negative for chest pain and leg swelling.  Gastrointestinal: Negative.  Negative for abdominal pain and constipation.  Genitourinary: Negative.  Negative for frequency.  Musculoskeletal: Positive for joint pain and myalgias. Negative for back pain and neck pain.  Skin: Negative.  Negative for rash.  Neurological: Negative.  Negative for dizziness, focal weakness, weakness and headaches.  Psychiatric/Behavioral: Negative.  The patient is not nervous/anxious.     As per HPI. Otherwise, a complete review of systems is negative.  PAST MEDICAL HISTORY: Past Medical History:  Diagnosis Date  . Aortic valve replaced     1998  . Arthritis   . Atrial fibrillation ([place])   . COPD (chronic obstructive pulmonary disease) ([place])   . Dysrhythmia    atrial fib  . Hernia of abdominal wall   . Hypertension   . Indigestion   . Neuropathy   . Neuropathy   . Prostate cancer ([place])   . Sleep apnea   . Thrombocytopenia ([place])   . Tremors of nervous system   . Urinary problem     PAST SURGICAL HISTORY: Past Surgical History:  Procedure Laterality Date  . CALDWELL LUC    . tongue      growth removed  . TRANSURETHRAL RESECTION OF PROSTATE N/A 01/18/2019   Procedure: CHANNEL TRANSURETHRAL RESECTION OF THE PROSTATE (TURP);  Surgeon: [name], MD;  Location: [facility] ORS;  Service: Urology;  Laterality: N/A;  . VALVE REPLACEMENT      FAMILY HISTORY: Family History  Problem Relation Age of Onset  . Heart attack Father   . Alcohol abuse Father   . Congestive Heart Failure Mother   . Alzheimer's disease Sister     ADVANCED DIRECTIVES (Y/N):  N  HEALTH MAINTENANCE: Social History   Tobacco Use  . Smoking status: Former Smoker    Types: Cigarettes    Quit date: 01/13/2001    Years since quitting: 19.4  . Smokeless tobacco: Former Systems developer    Quit date: 09/24/2000  Vaping Use  . Vaping Use: Never used  Substance Use Topics  . Alcohol use: Yes    Alcohol/week: 10.0 standard drinks    Types: 10 Cans of beer per week  . Drug use: No  Colonoscopy:  PAP:  Bone density:  Lipid panel:  Allergies  Allergen Reactions  . Oxycodone-Acetaminophen Nausea Only and Shortness Of Breath  . Amoxicillin Itching    Hand itching  . Penicillins Rash    Did it involve swelling of the face/tongue/throat, SOB, or low BP? No Did it involve sudden or severe rash/hives, skin peeling, or any reaction on the inside of your mouth or nose? No Did you need to seek medical attention at a hospital or doctor's office? No When did it last happen?4-5 years ago If all above answers are "NO", may proceed with  cephalosporin use.     Current Outpatient Medications  Medication Sig Dispense Refill  . ALPRAZolam (XANAX) 0.25 MG tablet Take 0.25 mg by mouth daily as needed for anxiety.     Marland Kitchen aspirin EC 81 MG tablet Take 81 mg by mouth daily.     . calcium carbonate (TUMS - DOSED IN MG ELEMENTAL CALCIUM) 500 MG chewable tablet Chew 1 tablet by mouth daily.     Marland Kitchen docusate sodium (COLACE) 100 MG capsule Take 1 capsule (100 mg total) by mouth 2 (two) times daily. 60 capsule 0  . furosemide (LASIX) 20 MG tablet Take 20 mg by mouth daily.    Marland Kitchen gabapentin (NEURONTIN) 300 MG capsule Take 900 mg by mouth 4 (four) times daily.     . metoprolol succinate (TOPROL-XL) 25 MG 24 hr tablet Take 25 mg by mouth daily.     . tamsulosin (FLOMAX) 0.4 MG CAPS capsule Take 1 capsule by mouth twice daily 180 capsule 0  . sulfamethoxazole-trimethoprim (BACTRIM DS) 800-160 MG tablet Take 1 tablet by mouth every 12 (twelve) hours. (Patient not taking: Reported on 05/22/2020) 14 tablet 0   No current facility-administered medications for this visit.    OBJECTIVE: Vitals:   06/20/20 1327  BP: (!) 152/65  Pulse: 97  Resp: 20  Temp: 97.6 F (36.4 C)  SpO2: 100%     Body mass index is 39 kg/m.    ECOG FS:0 - Asymptomatic  Physical Exam Constitutional:      Appearance: Normal appearance. He is obese.  HENT:     Head: Normocephalic and atraumatic.  Eyes:     Pupils: Pupils are equal, round, and reactive to light.  Cardiovascular:     Rate and Rhythm: Normal rate and regular rhythm.     Heart sounds: Normal heart sounds. No murmur heard.   Pulmonary:     Effort: Pulmonary effort is normal.     Breath sounds: Normal breath sounds. No wheezing.  Abdominal:     General: Bowel sounds are normal. There is no distension.     Palpations: Abdomen is soft.     Tenderness: There is no abdominal tenderness.  Musculoskeletal:        General: Normal range of motion.     Cervical back: Normal range of motion.  Skin:     General: Skin is warm and dry.     Findings: No rash.  Neurological:     Mental Status: He is alert and oriented to person, place, and time.  Psychiatric:        Judgment: Judgment normal.     LAB RESULTS:  Lab Results  Component Value Date   NA 141 04/21/2020   K 4.3 04/21/2020   CL 107 04/21/2020   CO2 27 04/21/2020   GLUCOSE 101 (H) 04/21/2020   BUN 17 04/21/2020   CREATININE 0.69 04/21/2020   CALCIUM 8.5 (L)  04/21/2020   PROT 5.8 (L) 03/07/2020   ALBUMIN 3.7 03/07/2020   AST 38 03/07/2020   ALT 30 03/07/2020   ALKPHOS 47 03/07/2020   BILITOT 1.9 (H) 03/07/2020   GFRNONAA >60 04/21/2020   GFRAA >60 04/21/2020    Lab Results  Component Value Date   WBC 2.1 (L) 06/20/2020   NEUTROABS 1.4 (L) 06/20/2020   HGB 13.0 06/20/2020   HCT 38.7 (L) 06/20/2020   MCV 102.1 (H) 06/20/2020   PLT 46 (L) 06/20/2020     STUDIES: VAS [place] ABI WITH/WO TBI  Result Date: 05/22/2020 LOWER EXTREMITY DOPPLER STUDY Indications: Cellulitis.  Performing Technologist: Concha Norway RVT  Examination Guidelines: A complete evaluation includes at minimum, Doppler waveform signals and systolic blood pressure reading at the level of bilateral brachial, anterior tibial, and posterior tibial arteries, when vessel segments are accessible. Bilateral testing is considered an integral part of a complete examination. Photoelectric Plethysmograph (PPG) waveforms and toe systolic pressure readings are included as required and additional duplex testing as needed. Limited examinations for reoccurring indications may be performed as noted.  ABI Findings: +---------+------------------+-----+--------+--------+ Right    Rt Pressure (mmHg)IndexWaveformComment  +---------+------------------+-----+--------+--------+ Brachial 134                                     +---------+------------------+-----+--------+--------+ ATA      166               1.24 biphasic          +---------+------------------+-----+--------+--------+ PTA      176               1.31 biphasic         +---------+------------------+-----+--------+--------+ Great Toe93                0.69 Normal           +---------+------------------+-----+--------+--------+ +---------+------------------+-----+--------+-------+ Left     Lt Pressure (mmHg)IndexWaveformComment +---------+------------------+-----+--------+-------+ ATA      181               1.35 biphasic        +---------+------------------+-----+--------+-------+ PTA      182               1.36 biphasic        +---------+------------------+-----+--------+-------+ Great Toe102               0.76 Normal          +---------+------------------+-----+--------+-------+  Summary: Right: Resting right ankle-brachial index is within normal range. No evidence of significant right lower extremity arterial disease. The right toe-brachial index is normal. Left: Resting left ankle-brachial index is within normal range. No evidence of significant left lower extremity arterial disease. The left toe-brachial index is normal.  *See table(s) above for measurements and observations.  Electronically signed by Hortencia Pilar MD on 05/22/2020 at 5:08:40 PM.   Final    VAS [place] LOWER EXTREMITY VENOUS REFLUX  Result Date: 05/22/2020  Lower Venous Reflux Study Indications: Swelling.  Performing Technologist: Concha Norway RVT  Examination Guidelines: A complete evaluation includes B-mode imaging, spectral Doppler, color Doppler, and power Doppler as needed of all accessible portions of each vessel. Bilateral testing is considered an integral part of a complete examination. Limited examinations for reoccurring indications may be performed as noted. The reflux portion of the exam is performed with the patient in reverse Trendelenburg. Significant venous reflux is defined as >500  ms in the superficial venous system, and >1 second in the deep venous  system.   Summary: Bilateral: - No evidence of deep vein thrombosis seen in the lower extremities, bilaterally, from the common femoral through the popliteal veins. - No evidence of superficial venous thrombosis in the lower extremities, bilaterally. - No evidence of deep venous insufficiency seen bilaterally in the lower extremity. - No evidence of superficial venous reflux seen in the greater saphenous veins bilaterally. - No evidence of superficial venous reflux seen in the short saphenous veins bilaterally.  *See table(s) above for measurements and observations. Electronically signed by Hortencia Pilar MD on 05/22/2020 at 5:08:43 PM.    Final     ASSESSMENT: Stage IVB prostate cancer  PLAN:    1.  Stage IVB prostate cancer: Patient initially received XRT for local control disease in 2016. His PSA initially trended down to 5.37, but now is trending up slightly and is 7.25.  [employer] PET scan on October 05, 2019 reviewed independently with multiple bony lesions noted.  Patient received his first dose of Firmagon on October 11, 2019.  He will also benefit from [facility] for his bony lesions.  Will not initiate Zytiga or Xtandi at this time given his minimal tumor burden, but will consider in the near future given his slowly trending up PSA.  He last received Eligard on February 18, 2020.  Proceed with Xgeva today.  Calcium 8.5.  Given the financial burden of treatment, patient wishes to change evaluation and treatment to every other month.  Return to clinic in 8 weeks for further evaluation and continuation of treatment.  PSA pending during dictation. 2.  Pancytopenia: Chronic and unchanged.  Continue to monitor closely.  Can consider bone marrow biopsy in the future if necessary. 3.  Hypocalcemia: Calcium level 8.5.  Chronic and unchanged.  Proceed with Xgeva as above.  Continue oral calcium supplementation. 4.  Gait issues: Recommended possible outpatient PT/OT.  Patient has refused at this time.  He is currently using  a walker and holds onto the wall for balance.  We did discuss fall risk precautions.  He does live alone.  Patient will let us know if he is interested in services at a later date.  Disposition: RTC in 8 weeks for lab work and [employer].  Greater than 50% was spent in counseling and coordination of care with this patient including but not limited to discussion of the relevant topics above (See A&P) including, but not limited to diagnosis and management of acute and chronic medical conditions.   Patient expressed understanding and was in agreement with this plan. He also understands that He can call clinic at any time with any questions, concerns, or complaints.   Cancer Staging Prostate cancer [facility]) Staging form: Prostate, AJCC 8th Edition - Clinical: Stage IVB (cTX, cN0, pM1b, PSA: 7.6) - Signed by Lloyd Huger, MD on 10/14/2019   Jacquelin Hawking, NP   06/20/2020 1:35 PM

## 2020-06-20 NOTE — Progress Notes (Signed)
CA 8.5, per NP ok to give xgeva

## 2020-08-09 DIAGNOSIS — I890 Lymphedema, not elsewhere classified: Secondary | ICD-10-CM | POA: Diagnosis not present

## 2020-08-19 NOTE — Progress Notes (Deleted)
[place]  [phone] [phone]  ID: Earl Ramirez OB: 1941/04/22  MR#: [id number]  SHF#:[id number]  Patient Care Team: Baxter Hire, MD as PCP - General (Internal Medicine) Murrell Redden, MD (Urology) [name] Javier Docker, MD (Cardiology) Lloyd Huger, MD as Consulting Physician (Oncology)  CHIEF COMPLAINT: Stage IVB prostate cancer.  INTERVAL HISTORY: Patient returns to clinic today for further evaluation and continuation of Xgeva.  He continues to feel well and remains asymptomatic.  He has no neurologic complaints.  He denies any recent fevers or illnesses.  He has a good appetite and denies weight loss. He denies any pain.  He has no chest pain, shortness of breath, cough, or hemoptysis.  He denies any nausea, vomiting, constipation, or diarrhea.  He has no urinary complaints.  Patient offers no specific complaints today.   REVIEW OF SYSTEMS:   Review of Systems  Constitutional: Negative.  Negative for fever, malaise/fatigue and weight loss.  Respiratory: Negative.  Negative for cough, hemoptysis and shortness of breath.   Cardiovascular: Negative.  Negative for chest pain and leg swelling.  Gastrointestinal: Negative.  Negative for abdominal pain and constipation.  Genitourinary: Negative.  Negative for frequency.  Musculoskeletal: Negative.  Negative for back pain and neck pain.  Skin: Negative.  Negative for rash.  Neurological: Negative.  Negative for dizziness, focal weakness, weakness and headaches.  Psychiatric/Behavioral: Negative.  The patient is not nervous/anxious.     As per HPI. Otherwise, a complete review of systems is negative.  PAST MEDICAL HISTORY: Past Medical History:  Diagnosis Date  . Aortic valve replaced    1998  . Arthritis   . Atrial fibrillation ([place])   . COPD (chronic obstructive pulmonary disease) ([place])   . Dysrhythmia    atrial fib  . Hernia of abdominal wall   . Hypertension   . Indigestion    . Neuropathy   . Neuropathy   . Prostate cancer ([place])   . Sleep apnea   . Thrombocytopenia ([place])   . Tremors of nervous system   . Urinary problem     PAST SURGICAL HISTORY: Past Surgical History:  Procedure Laterality Date  . CALDWELL LUC    . tongue      growth removed  . TRANSURETHRAL RESECTION OF PROSTATE N/A 01/18/2019   Procedure: CHANNEL TRANSURETHRAL RESECTION OF THE PROSTATE (TURP);  Surgeon: [name], MD;  Location: [facility] ORS;  Service: Urology;  Laterality: N/A;  . VALVE REPLACEMENT      FAMILY HISTORY: Family History  Problem Relation Age of Onset  . Heart attack Father   . Alcohol abuse Father   . Congestive Heart Failure Mother   . Alzheimer's disease Sister     ADVANCED DIRECTIVES (Y/N):  N  HEALTH MAINTENANCE: Social History   Tobacco Use  . Smoking status: Former Smoker    Types: Cigarettes    Quit date: 01/13/2001    Years since quitting: 19.6  . Smokeless tobacco: Former Systems developer    Quit date: 09/24/2000  Vaping Use  . Vaping Use: Never used  Substance Use Topics  . Alcohol use: Yes    Alcohol/week: 10.0 standard drinks    Types: 10 Cans of beer per week  . Drug use: No     Colonoscopy:  PAP:  Bone density:  Lipid panel:  Allergies  Allergen Reactions  . Oxycodone-Acetaminophen Nausea Only and Shortness Of Breath  . Amoxicillin Itching    Hand itching  . Penicillins Rash  Did it involve swelling of the face/tongue/throat, SOB, or low BP? No Did it involve sudden or severe rash/hives, skin peeling, or any reaction on the inside of your mouth or nose? No Did you need to seek medical attention at a hospital or doctor's office? No When did it last happen?4-5 years ago If all above answers are "NO", may proceed with cephalosporin use.     Current Outpatient Medications  Medication Sig Dispense Refill  . ALPRAZolam (XANAX) 0.25 MG tablet Take 0.25 mg by mouth daily as needed for anxiety.     Marland Kitchen aspirin EC 81 MG tablet Take  81 mg by mouth daily.     . calcium carbonate (TUMS - DOSED IN MG ELEMENTAL CALCIUM) 500 MG chewable tablet Chew 1 tablet by mouth daily.     Marland Kitchen docusate sodium (COLACE) 100 MG capsule Take 1 capsule (100 mg total) by mouth 2 (two) times daily. 60 capsule 0  . furosemide (LASIX) 20 MG tablet Take 20 mg by mouth daily.    Marland Kitchen gabapentin (NEURONTIN) 300 MG capsule Take 900 mg by mouth 4 (four) times daily.     . metoprolol succinate (TOPROL-XL) 25 MG 24 hr tablet Take 25 mg by mouth daily.     Marland Kitchen sulfamethoxazole-trimethoprim (BACTRIM DS) 800-160 MG tablet Take 1 tablet by mouth every 12 (twelve) hours. (Patient not taking: Reported on 05/22/2020) 14 tablet 0  . tamsulosin (FLOMAX) 0.4 MG CAPS capsule Take 1 capsule by mouth twice daily 180 capsule 0   No current facility-administered medications for this visit.    OBJECTIVE: There were no vitals filed for this visit.   There is no height or weight on file to calculate BMI.    ECOG FS:0 - Asymptomatic  General: Well-developed, well-nourished, no acute distress. Eyes: Pink conjunctiva, anicteric sclera. HEENT: Normocephalic, moist mucous membranes. Lungs: No audible wheezing or coughing. Heart: Regular rate and rhythm. Abdomen: Soft, nontender, no obvious distention. Musculoskeletal: No edema, cyanosis, or clubbing. Neuro: Alert, answering all questions appropriately. Cranial nerves grossly intact. Skin: No rashes or petechiae noted. Psych: Normal affect.   LAB RESULTS:  Lab Results  Component Value Date   NA 141 06/20/2020   K 4.1 06/20/2020   CL 108 06/20/2020   CO2 26 06/20/2020   GLUCOSE 110 (H) 06/20/2020   BUN 20 06/20/2020   CREATININE 0.64 06/20/2020   CALCIUM 8.5 (L) 06/20/2020   PROT 5.8 (L) 03/07/2020   ALBUMIN 3.7 03/07/2020   AST 38 03/07/2020   ALT 30 03/07/2020   ALKPHOS 47 03/07/2020   BILITOT 1.9 (H) 03/07/2020   GFRNONAA >60 06/20/2020   GFRAA >60 04/21/2020    Lab Results  Component Value Date   WBC 2.1  (L) 06/20/2020   NEUTROABS 1.4 (L) 06/20/2020   HGB 13.0 06/20/2020   HCT 38.7 (L) 06/20/2020   MCV 102.1 (H) 06/20/2020   PLT 46 (L) 06/20/2020     STUDIES: No results found.  ASSESSMENT: Stage IVB prostate cancer  PLAN:    1.  Stage IVB prostate cancer: Patient initially received XRT for local control disease in 2016. His PSA initially trended down to 5.37, but now is trending up slightly and is 7.25.  [facility] PET scan on October 05, 2019 reviewed independently with multiple bony lesions noted.  Patient received his first dose of Firmagon on October 11, 2019.  He will also benefit from [facility] for his bony lesions.  Will not initiate Zytiga or Xtandi at this time given his minimal  tumor burden, but will consider in the near future given his slowly trending up PSA.  He last received Eligard on February 18, 2020.  Proceed with Xgeva today.  Given the financial burden of treatment, patient wishes to change evaluation and treatment to every other month.  Return to clinic in 8 weeks for further evaluation and continuation of treatment.   2.  Pancytopenia: Chronic and unchanged.  Continue to monitor closely.  Can consider bone marrow biopsy in the future if necessary. 3.  Hypocalcemia: Chronic and unchanged.  Proceed with Xgeva as above.  Continue oral calcium supplementation.  I spent a total of 30 minutes reviewing chart data, face-to-face evaluation with the patient, counseling and coordination of care as detailed above.    Patient expressed understanding and was in agreement with this plan. He also understands that He can call clinic at any time with any questions, concerns, or complaints.   Cancer Staging Prostate cancer [facility]) Staging form: Prostate, AJCC 8th Edition - Clinical: Stage IVB (cTX, cN0, pM1b, PSA: 7.6) - Signed by Lloyd Huger, MD on 10/14/2019   Lloyd Huger, MD   08/19/2020 7:54 AM

## 2020-08-22 ENCOUNTER — Inpatient Hospital Stay: Payer: PPO

## 2020-08-22 ENCOUNTER — Inpatient Hospital Stay: Payer: PPO | Admitting: Oncology

## 2020-08-22 DIAGNOSIS — C61 Malignant neoplasm of prostate: Secondary | ICD-10-CM

## 2020-09-01 ENCOUNTER — Other Ambulatory Visit: Payer: PPO

## 2020-09-01 ENCOUNTER — Ambulatory Visit: Payer: PPO | Admitting: Oncology

## 2020-09-01 ENCOUNTER — Ambulatory Visit: Payer: PPO

## 2020-09-02 ENCOUNTER — Other Ambulatory Visit: Payer: Self-pay | Admitting: Urology

## 2020-09-02 DIAGNOSIS — N401 Enlarged prostate with lower urinary tract symptoms: Secondary | ICD-10-CM

## 2020-09-08 DIAGNOSIS — I872 Venous insufficiency (chronic) (peripheral): Secondary | ICD-10-CM | POA: Diagnosis not present

## 2020-09-08 DIAGNOSIS — G4733 Obstructive sleep apnea (adult) (pediatric): Secondary | ICD-10-CM | POA: Diagnosis not present

## 2020-10-06 DIAGNOSIS — G4733 Obstructive sleep apnea (adult) (pediatric): Secondary | ICD-10-CM | POA: Diagnosis not present

## 2020-10-06 DIAGNOSIS — I872 Venous insufficiency (chronic) (peripheral): Secondary | ICD-10-CM | POA: Diagnosis not present

## 2020-10-09 ENCOUNTER — Inpatient Hospital Stay: Payer: PPO

## 2020-10-09 ENCOUNTER — Inpatient Hospital Stay: Payer: PPO | Attending: Oncology | Admitting: Oncology

## 2020-10-09 ENCOUNTER — Encounter: Payer: Self-pay | Admitting: Oncology

## 2020-10-09 ENCOUNTER — Other Ambulatory Visit: Payer: Self-pay | Admitting: Oncology

## 2020-10-09 VITALS — BP 95/76 | HR 72 | Temp 98.3°F | Resp 18 | Wt 255.1 lb

## 2020-10-09 DIAGNOSIS — G629 Polyneuropathy, unspecified: Secondary | ICD-10-CM | POA: Diagnosis not present

## 2020-10-09 DIAGNOSIS — Z87891 Personal history of nicotine dependence: Secondary | ICD-10-CM | POA: Insufficient documentation

## 2020-10-09 DIAGNOSIS — I10 Essential (primary) hypertension: Secondary | ICD-10-CM | POA: Diagnosis not present

## 2020-10-09 DIAGNOSIS — C61 Malignant neoplasm of prostate: Secondary | ICD-10-CM

## 2020-10-09 DIAGNOSIS — J449 Chronic obstructive pulmonary disease, unspecified: Secondary | ICD-10-CM | POA: Diagnosis not present

## 2020-10-09 DIAGNOSIS — R269 Unspecified abnormalities of gait and mobility: Secondary | ICD-10-CM | POA: Diagnosis not present

## 2020-10-09 DIAGNOSIS — Z79899 Other long term (current) drug therapy: Secondary | ICD-10-CM | POA: Diagnosis not present

## 2020-10-09 DIAGNOSIS — Z7982 Long term (current) use of aspirin: Secondary | ICD-10-CM | POA: Diagnosis not present

## 2020-10-09 DIAGNOSIS — D696 Thrombocytopenia, unspecified: Secondary | ICD-10-CM | POA: Diagnosis not present

## 2020-10-09 DIAGNOSIS — G4730 Sleep apnea, unspecified: Secondary | ICD-10-CM | POA: Diagnosis not present

## 2020-10-09 DIAGNOSIS — I4891 Unspecified atrial fibrillation: Secondary | ICD-10-CM | POA: Diagnosis not present

## 2020-10-09 DIAGNOSIS — Z79818 Long term (current) use of other agents affecting estrogen receptors and estrogen levels: Secondary | ICD-10-CM | POA: Diagnosis not present

## 2020-10-09 DIAGNOSIS — D61818 Other pancytopenia: Secondary | ICD-10-CM | POA: Diagnosis not present

## 2020-10-09 DIAGNOSIS — M129 Arthropathy, unspecified: Secondary | ICD-10-CM | POA: Diagnosis not present

## 2020-10-09 LAB — CBC WITH DIFFERENTIAL/PLATELET
Abs Immature Granulocytes: 0 10*3/uL (ref 0.00–0.07)
Basophils Absolute: 0 10*3/uL (ref 0.0–0.1)
Basophils Relative: 1 %
Eosinophils Absolute: 0 10*3/uL (ref 0.0–0.5)
Eosinophils Relative: 2 %
HCT: 37.5 % — ABNORMAL LOW (ref 39.0–52.0)
Hemoglobin: 12.5 g/dL — ABNORMAL LOW (ref 13.0–17.0)
Immature Granulocytes: 0 %
Lymphocytes Relative: 24 %
Lymphs Abs: 0.4 10*3/uL — ABNORMAL LOW (ref 0.7–4.0)
MCH: 34.6 pg — ABNORMAL HIGH (ref 26.0–34.0)
MCHC: 33.3 g/dL (ref 30.0–36.0)
MCV: 103.9 fL — ABNORMAL HIGH (ref 80.0–100.0)
Monocytes Absolute: 0.2 10*3/uL (ref 0.1–1.0)
Monocytes Relative: 10 %
Neutro Abs: 1.2 10*3/uL — ABNORMAL LOW (ref 1.7–7.7)
Neutrophils Relative %: 63 %
Platelets: 38 10*3/uL — ABNORMAL LOW (ref 150–400)
RBC: 3.61 MIL/uL — ABNORMAL LOW (ref 4.22–5.81)
RDW: 13.9 % (ref 11.5–15.5)
WBC: 1.8 10*3/uL — ABNORMAL LOW (ref 4.0–10.5)
nRBC: 0 % (ref 0.0–0.2)

## 2020-10-09 LAB — BASIC METABOLIC PANEL
Anion gap: 9 (ref 5–15)
BUN: 22 mg/dL (ref 8–23)
CO2: 25 mmol/L (ref 22–32)
Calcium: 8.2 mg/dL — ABNORMAL LOW (ref 8.9–10.3)
Chloride: 108 mmol/L (ref 98–111)
Creatinine, Ser: 0.68 mg/dL (ref 0.61–1.24)
GFR, Estimated: >60 mL/min (ref >60)
Glucose, Bld: 106 mg/dL — ABNORMAL HIGH (ref 70–99)
Potassium: 4.1 mmol/L (ref 3.5–5.1)
Sodium: 142 mmol/L (ref 135–145)

## 2020-10-09 LAB — PSA: Prostatic Specific Antigen: 10.81 ng/mL — ABNORMAL HIGH (ref 0.00–4.00)

## 2020-10-09 MED ORDER — DENOSUMAB 120 MG/1.7ML ~~LOC~~ SOLN
120.0000 mg | Freq: Once | SUBCUTANEOUS | Status: AC
  Administered 2020-10-09: 120 mg via SUBCUTANEOUS
  Filled 2020-10-09: qty 1.7

## 2020-10-09 MED ORDER — LEUPROLIDE ACETATE (6 MONTH) 45 MG ~~LOC~~ KIT
45.0000 mg | PACK | Freq: Once | SUBCUTANEOUS | Status: AC
  Administered 2020-10-09: 45 mg via SUBCUTANEOUS
  Filled 2020-10-09: qty 45

## 2020-10-09 NOTE — Progress Notes (Signed)
[place]  [phone] [phone]) [phone]  ID: [name] OB: January 20, 1941  MR#: [id number]  SJG#:[id number]  Patient Care Team: Baxter Hire, MD as PCP - General (Internal Medicine) Murrell Redden, MD (Urology) [name] Javier Docker, MD (Cardiology) Lloyd Huger, MD as Consulting Physician (Oncology)  CHIEF COMPLAINT: Stage IVB prostate cancer.  INTERVAL HISTORY: Patient returns to clinic today for further evaluation and continuation of Xgeva and Eligard.  He canceled several appointments and has not been evaluated since 06/20/2020.  He last received Xgeva on 06/20/2020.  He last received Eligard on 02/18/2020.  In the interim, he has done well.  He continues to use a walker to ambulate at home.  His balance is stable.  He denies any recent falls. He has no neurologic complaints.  He denies any recent fevers or illnesses.  He has a good appetite and denies weight loss. He denies any pain.  He has no chest pain, shortness of breath, cough, or hemoptysis.  He denies any nausea, vomiting, constipation, or diarrhea.  He has no urinary complaints.  Patient offers no specific complaints today.     REVIEW OF SYSTEMS:   Review of Systems  Constitutional: Positive for malaise/fatigue. Negative for fever and weight loss.  Respiratory: Negative.  Negative for cough, hemoptysis and shortness of breath.   Cardiovascular: Negative.  Negative for chest pain and leg swelling.  Gastrointestinal: Negative.  Negative for abdominal pain and constipation.  Genitourinary: Negative.  Negative for frequency.  Musculoskeletal: Positive for joint pain and myalgias. Negative for back pain and neck pain.  Skin: Negative.  Negative for rash.  Neurological: Negative.  Negative for dizziness, focal weakness, weakness and headaches.  Psychiatric/Behavioral: Negative.  The patient is not nervous/anxious.     As per HPI. Otherwise, a complete review of systems is  negative.  PAST MEDICAL HISTORY: Past Medical History:  Diagnosis Date  . Aortic valve replaced    1998  . Arthritis   . Atrial fibrillation ([place])   . COPD (chronic obstructive pulmonary disease) ([place])   . Dysrhythmia    atrial fib  . Hernia of abdominal wall   . Hypertension   . Indigestion   . Neuropathy   . Neuropathy   . Prostate cancer ([place])   . Sleep apnea   . Thrombocytopenia ([place])   . Tremors of nervous system   . Urinary problem     PAST SURGICAL HISTORY: Past Surgical History:  Procedure Laterality Date  . CALDWELL LUC    . tongue      growth removed  . TRANSURETHRAL RESECTION OF PROSTATE N/A 01/18/2019   Procedure: CHANNEL TRANSURETHRAL RESECTION OF THE PROSTATE (TURP);  Surgeon: [name], MD;  Location: [facility] ORS;  Service: Urology;  Laterality: N/A;  . VALVE REPLACEMENT      FAMILY HISTORY: Family History  Problem Relation Age of Onset  . Heart attack Father   . Alcohol abuse Father   . Congestive Heart Failure Mother   . Alzheimer's disease Sister     ADVANCED DIRECTIVES (Y/N):  N  HEALTH MAINTENANCE: Social History   Tobacco Use  . Smoking status: Former Smoker    Types: Cigarettes    Quit date: 01/13/2001    Years since quitting: 19.7  . Smokeless tobacco: Former Systems developer    Quit date: 09/24/2000  Vaping Use  . Vaping Use: Never used  Substance Use Topics  . Alcohol use: Yes    Alcohol/week: 10.0 standard drinks  Types: 10 Cans of beer per week  . Drug use: No     Colonoscopy:  PAP:  Bone density:  Lipid panel:  Allergies  Allergen Reactions  . Oxycodone-Acetaminophen Nausea Only and Shortness Of Breath  . Amoxicillin Itching    Hand itching  . Penicillins Rash    Did it involve swelling of the face/tongue/throat, SOB, or low BP? No Did it involve sudden or severe rash/hives, skin peeling, or any reaction on the inside of your mouth or nose? No Did you need to seek medical attention at a hospital or doctor's office?  No When did it last happen?4-5 years ago If all above answers are "NO", may proceed with cephalosporin use.     Current Outpatient Medications  Medication Sig Dispense Refill  . ALPRAZolam (XANAX) 0.25 MG tablet Take 0.25 mg by mouth daily as needed for anxiety.     Marland Kitchen aspirin EC 81 MG tablet Take 81 mg by mouth daily.     . calcium carbonate (TUMS - DOSED IN MG ELEMENTAL CALCIUM) 500 MG chewable tablet Chew 1 tablet by mouth daily.     Marland Kitchen docusate sodium (COLACE) 100 MG capsule Take 1 capsule (100 mg total) by mouth 2 (two) times daily. 60 capsule 0  . furosemide (LASIX) 20 MG tablet Take 20 mg by mouth daily.    Marland Kitchen gabapentin (NEURONTIN) 300 MG capsule Take 900 mg by mouth 4 (four) times daily.     . metoprolol succinate (TOPROL-XL) 25 MG 24 hr tablet Take 25 mg by mouth daily.     Marland Kitchen sulfamethoxazole-trimethoprim (BACTRIM DS) 800-160 MG tablet Take 1 tablet by mouth every 12 (twelve) hours. (Patient not taking: Reported on 05/22/2020) 14 tablet 0  . tamsulosin (FLOMAX) 0.4 MG CAPS capsule Take 1 capsule by mouth twice daily 180 capsule 0   No current facility-administered medications for this visit.    OBJECTIVE: Vitals:   10/09/20 1329  BP: 95/76  Pulse: 72  Resp: 18  Temp: 98.3 F (36.8 C)  SpO2: 98%     Body mass index is 39.95 kg/m.    ECOG FS:0 - Asymptomatic  Physical Exam Constitutional:      Appearance: Normal appearance. He is obese.  HENT:     Head: Normocephalic and atraumatic.  Eyes:     Pupils: Pupils are equal, round, and reactive to light.  Cardiovascular:     Rate and Rhythm: Normal rate and regular rhythm.     Heart sounds: Normal heart sounds. No murmur heard.   Pulmonary:     Effort: Pulmonary effort is normal.     Breath sounds: Normal breath sounds. No wheezing.  Abdominal:     General: Bowel sounds are normal. There is no distension.     Palpations: Abdomen is soft.     Tenderness: There is no abdominal tenderness.  Musculoskeletal:         General: Normal range of motion.     Cervical back: Normal range of motion.  Skin:    General: Skin is warm and dry.     Findings: No rash.  Neurological:     Mental Status: He is alert and oriented to person, place, and time.  Psychiatric:        Judgment: Judgment normal.     LAB RESULTS:  Lab Results  Component Value Date   NA 142 10/09/2020   K 4.1 10/09/2020   CL 108 10/09/2020   CO2 25 10/09/2020   GLUCOSE 106 (H) 10/09/2020  BUN 22 10/09/2020   CREATININE 0.68 10/09/2020   CALCIUM 8.2 (L) 10/09/2020   PROT 5.8 (L) 03/07/2020   ALBUMIN 3.7 03/07/2020   AST 38 03/07/2020   ALT 30 03/07/2020   ALKPHOS 47 03/07/2020   BILITOT 1.9 (H) 03/07/2020   GFRNONAA >60 10/09/2020   GFRAA >60 04/21/2020    Lab Results  Component Value Date   WBC 1.8 (L) 10/09/2020   NEUTROABS 1.2 (L) 10/09/2020   HGB 12.5 (L) 10/09/2020   HCT 37.5 (L) 10/09/2020   MCV 103.9 (H) 10/09/2020   PLT 38 (L) 10/09/2020     STUDIES: No results found.  ASSESSMENT: Stage IVB prostate cancer  PLAN:    1.  Stage IVB prostate cancer:  -Initially received XRT for local control disease in 2016.  -PET scan from 10/05/2019 showed multiple bony lesions. -Started on Firmagon on 10/11/2019. -He was started on Xgeva for bony lesions on 10/22/19. -Would consider Zytiga plus Xtandi for progression in the future. -Labs from 10/09/2020 show hemoglobin 12.5, white count of 1.8, ANC 1200.  Calcium level is 8.2 -PSA is pending during dictation.   2.  Pancytopenia:  -Chronic and unchanged. -Consider a bone marrow if lab work continues to decline. -Labs from 10/09/2020 show a WBC of 1.8, ANC of 1200, Platelet count of 38,000 and hemoglobin of 12.5. -No recent infections or bleeding. -Baseline platelet count is between 35-50.   3.  Hypocalcemia:  -Calcium level is 8.2 today. -Proceed with Xgeva. -Continue oral calcium with vitamin D supplementation.  4.  Gait issues:  -Continue using assistive  devices-walker.  Disposition: Proceed with Eligard and Xgeva today. RTC monthly for Xgeva RTC every 6 months for Eligard. RTC in 3 months for lab work, MD assessment and Xgeva  Greater than 50% was spent in counseling and coordination of care with this patient including but not limited to discussion of the relevant topics above (See A&P) including, but not limited to diagnosis and management of acute and chronic medical conditions.   Patient expressed understanding and was in agreement with this plan. He also understands that He can call clinic at any time with any questions, concerns, or complaints.   Cancer Staging Prostate cancer [facility]) Staging form: Prostate, AJCC 8th Edition - Clinical: Stage IVB (cTX, cN0, pM1b, PSA: 7.6) - Signed by Lloyd Huger, MD on 10/14/2019 Prostate specific antigen (PSA) range: Less than [place], NP   10/09/2020 3:29 PM

## 2020-10-09 NOTE — Progress Notes (Signed)
Ca =8.2, last Albumin level taken in Aug 2021.  Ok to give [place] today per Dr. [name].

## 2020-10-17 ENCOUNTER — Ambulatory Visit: Payer: Self-pay | Admitting: Urology

## 2020-10-25 DIAGNOSIS — G609 Hereditary and idiopathic neuropathy, unspecified: Secondary | ICD-10-CM | POA: Diagnosis not present

## 2020-10-25 DIAGNOSIS — J42 Unspecified chronic bronchitis: Secondary | ICD-10-CM | POA: Diagnosis not present

## 2020-10-27 ENCOUNTER — Ambulatory Visit: Payer: PPO | Admitting: Urology

## 2020-10-31 DIAGNOSIS — I4892 Unspecified atrial flutter: Secondary | ICD-10-CM | POA: Diagnosis not present

## 2020-10-31 DIAGNOSIS — G4730 Sleep apnea, unspecified: Secondary | ICD-10-CM | POA: Diagnosis not present

## 2020-10-31 DIAGNOSIS — I890 Lymphedema, not elsewhere classified: Secondary | ICD-10-CM | POA: Diagnosis not present

## 2020-10-31 DIAGNOSIS — E538 Deficiency of other specified B group vitamins: Secondary | ICD-10-CM | POA: Diagnosis not present

## 2020-10-31 DIAGNOSIS — I739 Peripheral vascular disease, unspecified: Secondary | ICD-10-CM | POA: Diagnosis not present

## 2020-10-31 DIAGNOSIS — J42 Unspecified chronic bronchitis: Secondary | ICD-10-CM | POA: Diagnosis not present

## 2020-10-31 DIAGNOSIS — E559 Vitamin D deficiency, unspecified: Secondary | ICD-10-CM | POA: Diagnosis not present

## 2020-10-31 DIAGNOSIS — C61 Malignant neoplasm of prostate: Secondary | ICD-10-CM | POA: Diagnosis not present

## 2020-10-31 DIAGNOSIS — Z0000 Encounter for general adult medical examination without abnormal findings: Secondary | ICD-10-CM | POA: Diagnosis not present

## 2020-11-06 DIAGNOSIS — I872 Venous insufficiency (chronic) (peripheral): Secondary | ICD-10-CM | POA: Diagnosis not present

## 2020-11-06 DIAGNOSIS — G4733 Obstructive sleep apnea (adult) (pediatric): Secondary | ICD-10-CM | POA: Diagnosis not present

## 2020-11-09 ENCOUNTER — Inpatient Hospital Stay: Payer: PPO

## 2020-11-09 ENCOUNTER — Other Ambulatory Visit: Payer: Self-pay

## 2020-11-09 ENCOUNTER — Inpatient Hospital Stay: Payer: PPO | Attending: Oncology

## 2020-11-09 DIAGNOSIS — C61 Malignant neoplasm of prostate: Secondary | ICD-10-CM

## 2020-11-09 DIAGNOSIS — Z79899 Other long term (current) drug therapy: Secondary | ICD-10-CM | POA: Insufficient documentation

## 2020-11-09 LAB — CBC WITH DIFFERENTIAL/PLATELET
Abs Immature Granulocytes: 0.01 10*3/uL (ref 0.00–0.07)
Basophils Absolute: 0 10*3/uL (ref 0.0–0.1)
Basophils Relative: 1 %
Eosinophils Absolute: 0 10*3/uL (ref 0.0–0.5)
Eosinophils Relative: 1 %
HCT: 36.9 % — ABNORMAL LOW (ref 39.0–52.0)
Hemoglobin: 12.2 g/dL — ABNORMAL LOW (ref 13.0–17.0)
Immature Granulocytes: 1 %
Lymphocytes Relative: 24 %
Lymphs Abs: 0.4 10*3/uL — ABNORMAL LOW (ref 0.7–4.0)
MCH: 34.2 pg — ABNORMAL HIGH (ref 26.0–34.0)
MCHC: 33.1 g/dL (ref 30.0–36.0)
MCV: 103.4 fL — ABNORMAL HIGH (ref 80.0–100.0)
Monocytes Absolute: 0.2 10*3/uL (ref 0.1–1.0)
Monocytes Relative: 11 %
Neutro Abs: 1.1 10*3/uL — ABNORMAL LOW (ref 1.7–7.7)
Neutrophils Relative %: 62 %
Platelets: 37 10*3/uL — ABNORMAL LOW (ref 150–400)
RBC: 3.57 MIL/uL — ABNORMAL LOW (ref 4.22–5.81)
RDW: 13.8 % (ref 11.5–15.5)
WBC: 1.7 10*3/uL — ABNORMAL LOW (ref 4.0–10.5)
nRBC: 0 % (ref 0.0–0.2)

## 2020-11-09 LAB — BASIC METABOLIC PANEL
Anion gap: 10 (ref 5–15)
BUN: 21 mg/dL (ref 8–23)
CO2: 26 mmol/L (ref 22–32)
Calcium: 8.9 mg/dL (ref 8.9–10.3)
Chloride: 105 mmol/L (ref 98–111)
Creatinine, Ser: 0.72 mg/dL (ref 0.61–1.24)
GFR, Estimated: >60 mL/min (ref >60)
Glucose, Bld: 105 mg/dL — ABNORMAL HIGH (ref 70–99)
Potassium: 4.1 mmol/L (ref 3.5–5.1)
Sodium: 141 mmol/L (ref 135–145)

## 2020-11-09 LAB — PSA: Prostatic Specific Antigen: 12.45 ng/mL — ABNORMAL HIGH (ref 0.00–4.00)

## 2020-11-09 MED ORDER — DENOSUMAB 120 MG/1.7ML ~~LOC~~ SOLN
120.0000 mg | Freq: Once | SUBCUTANEOUS | Status: AC
  Administered 2020-11-09: 120 mg via SUBCUTANEOUS
  Filled 2020-11-09: qty 1.7

## 2020-11-20 ENCOUNTER — Ambulatory Visit (INDEPENDENT_AMBULATORY_CARE_PROVIDER_SITE_OTHER): Payer: PPO | Admitting: Vascular Surgery

## 2020-11-21 DIAGNOSIS — H2512 Age-related nuclear cataract, left eye: Secondary | ICD-10-CM | POA: Diagnosis not present

## 2020-11-21 DIAGNOSIS — H43811 Vitreous degeneration, right eye: Secondary | ICD-10-CM | POA: Diagnosis not present

## 2020-12-01 ENCOUNTER — Other Ambulatory Visit: Payer: Self-pay | Admitting: Urology

## 2020-12-01 ENCOUNTER — Ambulatory Visit: Payer: PPO | Admitting: Urology

## 2020-12-01 DIAGNOSIS — N401 Enlarged prostate with lower urinary tract symptoms: Secondary | ICD-10-CM

## 2020-12-06 ENCOUNTER — Ambulatory Visit (INDEPENDENT_AMBULATORY_CARE_PROVIDER_SITE_OTHER): Payer: PPO | Admitting: Nurse Practitioner

## 2020-12-06 DIAGNOSIS — I872 Venous insufficiency (chronic) (peripheral): Secondary | ICD-10-CM | POA: Diagnosis not present

## 2020-12-06 DIAGNOSIS — G4733 Obstructive sleep apnea (adult) (pediatric): Secondary | ICD-10-CM | POA: Diagnosis not present

## 2020-12-11 ENCOUNTER — Inpatient Hospital Stay: Payer: PPO

## 2020-12-11 ENCOUNTER — Other Ambulatory Visit: Payer: Self-pay

## 2020-12-11 ENCOUNTER — Inpatient Hospital Stay: Payer: PPO | Attending: Oncology

## 2020-12-11 DIAGNOSIS — Z79899 Other long term (current) drug therapy: Secondary | ICD-10-CM | POA: Insufficient documentation

## 2020-12-11 DIAGNOSIS — C7951 Secondary malignant neoplasm of bone: Secondary | ICD-10-CM | POA: Insufficient documentation

## 2020-12-11 DIAGNOSIS — C61 Malignant neoplasm of prostate: Secondary | ICD-10-CM | POA: Insufficient documentation

## 2020-12-11 LAB — CBC WITH DIFFERENTIAL/PLATELET
Abs Immature Granulocytes: 0 10*3/uL (ref 0.00–0.07)
Basophils Absolute: 0 10*3/uL (ref 0.0–0.1)
Basophils Relative: 1 %
Eosinophils Absolute: 0 10*3/uL (ref 0.0–0.5)
Eosinophils Relative: 2 %
HCT: 38.2 % — ABNORMAL LOW (ref 39.0–52.0)
Hemoglobin: 12.8 g/dL — ABNORMAL LOW (ref 13.0–17.0)
Immature Granulocytes: 0 %
Lymphocytes Relative: 24 %
Lymphs Abs: 0.3 10*3/uL — ABNORMAL LOW (ref 0.7–4.0)
MCH: 34 pg (ref 26.0–34.0)
MCHC: 33.5 g/dL (ref 30.0–36.0)
MCV: 101.3 fL — ABNORMAL HIGH (ref 80.0–100.0)
Monocytes Absolute: 0.2 10*3/uL (ref 0.1–1.0)
Monocytes Relative: 11 %
Neutro Abs: 0.8 10*3/uL — ABNORMAL LOW (ref 1.7–7.7)
Neutrophils Relative %: 62 %
Platelets: 34 10*3/uL — ABNORMAL LOW (ref 150–400)
RBC: 3.77 MIL/uL — ABNORMAL LOW (ref 4.22–5.81)
RDW: 13.7 % (ref 11.5–15.5)
Smear Review: NORMAL
WBC: 1.3 10*3/uL — CL (ref 4.0–10.5)
nRBC: 0 % (ref 0.0–0.2)

## 2020-12-11 LAB — PSA: Prostatic Specific Antigen: 12.43 ng/mL — ABNORMAL HIGH (ref 0.00–4.00)

## 2020-12-11 LAB — BASIC METABOLIC PANEL
Anion gap: 7 (ref 5–15)
BUN: 18 mg/dL (ref 8–23)
CO2: 27 mmol/L (ref 22–32)
Calcium: 8.5 mg/dL — ABNORMAL LOW (ref 8.9–10.3)
Chloride: 108 mmol/L (ref 98–111)
Creatinine, Ser: 0.63 mg/dL (ref 0.61–1.24)
GFR, Estimated: >60 mL/min (ref >60)
Glucose, Bld: 92 mg/dL (ref 70–99)
Potassium: 4.3 mmol/L (ref 3.5–5.1)
Sodium: 142 mmol/L (ref 135–145)

## 2020-12-11 MED ORDER — DENOSUMAB 120 MG/1.7ML ~~LOC~~ SOLN
120.0000 mg | Freq: Once | SUBCUTANEOUS | Status: AC
  Administered 2020-12-11: 120 mg via SUBCUTANEOUS
  Filled 2020-12-11: qty 1.7

## 2020-12-11 NOTE — Progress Notes (Signed)
Ca 8.5 ok to give xgeva per NP

## 2020-12-19 DIAGNOSIS — G4733 Obstructive sleep apnea (adult) (pediatric): Secondary | ICD-10-CM | POA: Diagnosis not present

## 2020-12-20 ENCOUNTER — Ambulatory Visit: Payer: Self-pay | Admitting: Urology

## 2021-01-05 NOTE — Progress Notes (Signed)
[place]  Telephone:(336) [phone] Fax:(336) [phone]  ID: Earl Ramirez OB: 08-31-1940  MR#: [id number]  ORV#:[id number]  Patient Care Team: Baxter Hire, MD as PCP - General (Internal Medicine) Murrell Redden, MD (Urology) [name] Javier Docker, MD (Cardiology) Lloyd Huger, MD as Consulting Physician (Oncology)  CHIEF COMPLAINT: Stage IVB prostate cancer.  INTERVAL HISTORY: Patient returns to clinic today for further evaluation and continuation of Xgeva.  He continues to feel well and remains asymptomatic.  He does not complain of pain today.  He has no neurologic complaints.  He denies any recent fevers or illnesses.  He has a good appetite and denies weight loss. He denies any pain.  He has no chest pain, shortness of breath, cough, or hemoptysis.  He denies any nausea, vomiting, constipation, or diarrhea.  He has no urinary complaints.  Patient offers no specific complaints today.  REVIEW OF SYSTEMS:   Review of Systems  Constitutional: Negative.  Negative for fever, malaise/fatigue and weight loss.  Respiratory: Negative.  Negative for cough, hemoptysis and shortness of breath.   Cardiovascular: Negative.  Negative for chest pain and leg swelling.  Gastrointestinal: Negative.  Negative for abdominal pain and constipation.  Genitourinary: Negative.  Negative for frequency.  Musculoskeletal: Negative.  Negative for back pain and neck pain.  Skin: Negative.  Negative for rash.  Neurological: Negative.  Negative for dizziness, focal weakness, weakness and headaches.  Psychiatric/Behavioral: Negative.  The patient is not nervous/anxious.     As per HPI. Otherwise, a complete review of systems is negative.  PAST MEDICAL HISTORY: Past Medical History:  Diagnosis Date  . Aortic valve replaced    1998  . Arthritis   . Atrial fibrillation ([place])   . COPD (chronic obstructive pulmonary disease) ([place])   . Dysrhythmia    atrial fib  . Hernia of abdominal  wall   . Hypertension   . Indigestion   . Neuropathy   . Neuropathy   . Prostate cancer ([place])   . Sleep apnea   . Thrombocytopenia ([place])   . Tremors of nervous system   . Urinary problem     PAST SURGICAL HISTORY: Past Surgical History:  Procedure Laterality Date  . CALDWELL LUC    . tongue      growth removed  . TRANSURETHRAL RESECTION OF PROSTATE N/A 01/18/2019   Procedure: CHANNEL TRANSURETHRAL RESECTION OF THE PROSTATE (TURP);  Surgeon: [name], MD;  Location: [facility] ORS;  Service: Urology;  Laterality: N/A;  . VALVE REPLACEMENT      FAMILY HISTORY: Family History  Problem Relation Age of Onset  . Heart attack Father   . Alcohol abuse Father   . Congestive Heart Failure Mother   . Alzheimer's disease Sister     ADVANCED DIRECTIVES (Y/N):  N  HEALTH MAINTENANCE: Social History   Tobacco Use  . Smoking status: Former Smoker    Types: Cigarettes    Quit date: 01/13/2001    Years since quitting: 20.0  . Smokeless tobacco: Former Systems developer    Quit date: 09/24/2000  Vaping Use  . Vaping Use: Never used  Substance Use Topics  . Alcohol use: Yes    Alcohol/week: 10.0 standard drinks    Types: 10 Cans of beer per week  . Drug use: No     Colonoscopy:  PAP:  Bone density:  Lipid panel:  Allergies  Allergen Reactions  . Oxycodone-Acetaminophen Nausea Only and Shortness Of Breath  . Amoxicillin Itching  Hand itching  . Penicillins Rash    Did it involve swelling of the face/tongue/throat, SOB, or low BP? No Did it involve sudden or severe rash/hives, skin peeling, or any reaction on the inside of your mouth or nose? No Did you need to seek medical attention at a hospital or doctor's office? No When did it last happen?4-5 years ago If all above answers are "NO", may proceed with cephalosporin use.     Current Outpatient Medications  Medication Sig Dispense Refill  . ALPRAZolam (XANAX) 0.25 MG tablet Take 0.25 mg by mouth daily as needed for  anxiety.     Marland Kitchen aspirin EC 81 MG tablet Take 81 mg by mouth daily.     . calcium carbonate (TUMS - DOSED IN MG ELEMENTAL CALCIUM) 500 MG chewable tablet Chew 1 tablet by mouth daily.     Marland Kitchen docusate sodium (COLACE) 100 MG capsule Take 1 capsule (100 mg total) by mouth 2 (two) times daily. 60 capsule 0  . furosemide (LASIX) 20 MG tablet Take 20 mg by mouth daily.    Marland Kitchen gabapentin (NEURONTIN) 300 MG capsule Take 900 mg by mouth 4 (four) times daily.     . metoprolol succinate (TOPROL-XL) 25 MG 24 hr tablet Take 25 mg by mouth daily.     . tamsulosin (FLOMAX) 0.4 MG CAPS capsule Take 1 capsule (0.4 mg total) by mouth 2 (two) times daily. 90 capsule 1  . sulfamethoxazole-trimethoprim (BACTRIM DS) 800-160 MG tablet Take 1 tablet by mouth every 12 (twelve) hours. (Patient not taking: No sig reported) 14 tablet 0   No current facility-administered medications for this visit.    OBJECTIVE: Vitals:   01/09/21 1406  BP: (!) 148/67  Pulse: 66  Resp: 20  Temp: 97.8 F (36.6 C)  SpO2: 100%     Body mass index is 38.06 kg/m.    ECOG FS:0 - Asymptomatic  General: Well-developed, well-nourished, no acute distress.  Sitting in a wheelchair. Eyes: Pink conjunctiva, anicteric sclera. HEENT: Normocephalic, moist mucous membranes. Lungs: No audible wheezing or coughing. Heart: Regular rate and rhythm. Abdomen: Soft, nontender, no obvious distention. Musculoskeletal: No edema, cyanosis, or clubbing. Neuro: Alert, answering all questions appropriately. Cranial nerves grossly intact. Skin: No rashes or petechiae noted. Psych: Normal affect.  LAB RESULTS:  Lab Results  Component Value Date   NA 139 01/09/2021   K 4.3 01/09/2021   CL 107 01/09/2021   CO2 24 01/09/2021   GLUCOSE 93 01/09/2021   BUN 19 01/09/2021   CREATININE 0.64 01/09/2021   CALCIUM 8.7 (L) 01/09/2021   PROT 5.8 (L) 03/07/2020   ALBUMIN 3.7 03/07/2020   AST 38 03/07/2020   ALT 30 03/07/2020   ALKPHOS 47 03/07/2020   BILITOT  1.9 (H) 03/07/2020   GFRNONAA >60 01/09/2021   GFRAA >60 04/21/2020    Lab Results  Component Value Date   WBC 1.5 (L) 01/09/2021   NEUTROABS 1.0 (L) 01/09/2021   HGB 12.8 (L) 01/09/2021   HCT 38.5 (L) 01/09/2021   MCV 100.0 01/09/2021   PLT 39 (L) 01/09/2021     STUDIES: No results found.  ASSESSMENT: Stage IVB prostate cancer  PLAN:    1.  Stage IVB prostate cancer: Patient initially received XRT for local control disease in 2016. His PSA initially trended down to 5.37, but now is trending up slightly and is 7.25.  [facility] PET scan on October 05, 2019 reviewed independently with multiple bony lesions noted.  Patient received his first dose  of Firmagon on October 11, 2019.  He will also benefit from [facility] for his bony lesions.  Will not initiate Zytiga or Xtandi at this time given his minimal tumor burden, but will consider in the near future.  Patient's PSA continues to trend up and is now 14.77.  Will reimage with PET scan in the next several weeks.  He last received Eligard on February 18, 2020.  Proceed with Xgeva today.  Return to clinic 1 to 2 days after his PET scan to discuss the results and treatment planning if necessary. 2.  Pancytopenia: Chronic and unchanged.  Continue to monitor closely.  Can consider bone marrow biopsy in the future if necessary. 3.  Hypocalcemia: Nearly resolved.  Proceed with Xgeva as above.  Continue oral calcium supplementation.  I spent a total of 30 minutes reviewing chart data, face-to-face evaluation with the patient, counseling and coordination of care as detailed above.    Patient expressed understanding and was in agreement with this plan. He also understands that He can call clinic at any time with any questions, concerns, or complaints.   Cancer Staging Prostate cancer [facility]) Staging form: Prostate, AJCC 8th Edition - Clinical: Stage IVB (cTX, cN0, pM1b, PSA: 7.6) - Signed by Lloyd Huger, MD on 10/14/2019 Prostate specific antigen (PSA)  range: Less than 10   Lloyd Huger, MD   01/10/2021 12:31 PM

## 2021-01-06 DIAGNOSIS — I872 Venous insufficiency (chronic) (peripheral): Secondary | ICD-10-CM | POA: Diagnosis not present

## 2021-01-06 DIAGNOSIS — G4733 Obstructive sleep apnea (adult) (pediatric): Secondary | ICD-10-CM | POA: Diagnosis not present

## 2021-01-08 ENCOUNTER — Telehealth: Payer: Self-pay | Admitting: Family Medicine

## 2021-01-08 DIAGNOSIS — N401 Enlarged prostate with lower urinary tract symptoms: Secondary | ICD-10-CM

## 2021-01-08 MED ORDER — TAMSULOSIN HCL 0.4 MG PO CAPS: 0.4000 mg | ORAL_CAPSULE | Freq: Two times a day (BID) | ORAL | 1 refills | Status: DC

## 2021-01-08 NOTE — Telephone Encounter (Signed)
Pharmacy called requesting a refill of Flomax. RX sent to pharmacy.

## 2021-01-09 ENCOUNTER — Inpatient Hospital Stay: Payer: PPO

## 2021-01-09 ENCOUNTER — Inpatient Hospital Stay (HOSPITAL_BASED_OUTPATIENT_CLINIC_OR_DEPARTMENT_OTHER): Payer: PPO | Admitting: Oncology

## 2021-01-09 ENCOUNTER — Encounter: Payer: Self-pay | Admitting: Oncology

## 2021-01-09 ENCOUNTER — Inpatient Hospital Stay: Payer: PPO | Attending: Oncology

## 2021-01-09 VITALS — BP 148/67 | HR 66 | Temp 97.8°F | Resp 20 | Wt 243.0 lb

## 2021-01-09 DIAGNOSIS — I10 Essential (primary) hypertension: Secondary | ICD-10-CM | POA: Insufficient documentation

## 2021-01-09 DIAGNOSIS — C61 Malignant neoplasm of prostate: Secondary | ICD-10-CM

## 2021-01-09 DIAGNOSIS — Z87891 Personal history of nicotine dependence: Secondary | ICD-10-CM | POA: Insufficient documentation

## 2021-01-09 DIAGNOSIS — Z79899 Other long term (current) drug therapy: Secondary | ICD-10-CM | POA: Diagnosis not present

## 2021-01-09 DIAGNOSIS — J449 Chronic obstructive pulmonary disease, unspecified: Secondary | ICD-10-CM | POA: Insufficient documentation

## 2021-01-09 DIAGNOSIS — G4730 Sleep apnea, unspecified: Secondary | ICD-10-CM | POA: Insufficient documentation

## 2021-01-09 DIAGNOSIS — Z7982 Long term (current) use of aspirin: Secondary | ICD-10-CM | POA: Insufficient documentation

## 2021-01-09 DIAGNOSIS — D696 Thrombocytopenia, unspecified: Secondary | ICD-10-CM | POA: Insufficient documentation

## 2021-01-09 DIAGNOSIS — C7951 Secondary malignant neoplasm of bone: Secondary | ICD-10-CM | POA: Diagnosis not present

## 2021-01-09 DIAGNOSIS — D61818 Other pancytopenia: Secondary | ICD-10-CM | POA: Diagnosis not present

## 2021-01-09 DIAGNOSIS — I4891 Unspecified atrial fibrillation: Secondary | ICD-10-CM | POA: Insufficient documentation

## 2021-01-09 LAB — CBC WITH DIFFERENTIAL/PLATELET
Abs Immature Granulocytes: 0.02 10*3/uL (ref 0.00–0.07)
Basophils Absolute: 0 10*3/uL (ref 0.0–0.1)
Basophils Relative: 1 %
Eosinophils Absolute: 0 10*3/uL (ref 0.0–0.5)
Eosinophils Relative: 2 %
HCT: 38.5 % — ABNORMAL LOW (ref 39.0–52.0)
Hemoglobin: 12.8 g/dL — ABNORMAL LOW (ref 13.0–17.0)
Immature Granulocytes: 1 %
Lymphocytes Relative: 25 %
Lymphs Abs: 0.4 10*3/uL — ABNORMAL LOW (ref 0.7–4.0)
MCH: 33.2 pg (ref 26.0–34.0)
MCHC: 33.2 g/dL (ref 30.0–36.0)
MCV: 100 fL (ref 80.0–100.0)
Monocytes Absolute: 0.1 10*3/uL (ref 0.1–1.0)
Monocytes Relative: 9 %
Neutro Abs: 1 10*3/uL — ABNORMAL LOW (ref 1.7–7.7)
Neutrophils Relative %: 62 %
Platelets: 39 10*3/uL — ABNORMAL LOW (ref 150–400)
RBC: 3.85 MIL/uL — ABNORMAL LOW (ref 4.22–5.81)
RDW: 13.6 % (ref 11.5–15.5)
WBC: 1.5 10*3/uL — ABNORMAL LOW (ref 4.0–10.5)
nRBC: 0 % (ref 0.0–0.2)

## 2021-01-09 LAB — BASIC METABOLIC PANEL
Anion gap: 8 (ref 5–15)
BUN: 19 mg/dL (ref 8–23)
CO2: 24 mmol/L (ref 22–32)
Calcium: 8.7 mg/dL — ABNORMAL LOW (ref 8.9–10.3)
Chloride: 107 mmol/L (ref 98–111)
Creatinine, Ser: 0.64 mg/dL (ref 0.61–1.24)
GFR, Estimated: >60 mL/min (ref >60)
Glucose, Bld: 93 mg/dL (ref 70–99)
Potassium: 4.3 mmol/L (ref 3.5–5.1)
Sodium: 139 mmol/L (ref 135–145)

## 2021-01-09 LAB — PSA: Prostatic Specific Antigen: 14.77 ng/mL — ABNORMAL HIGH (ref 0.00–4.00)

## 2021-01-09 MED ORDER — DENOSUMAB 120 MG/1.7ML ~~LOC~~ SOLN
120.0000 mg | Freq: Once | SUBCUTANEOUS | Status: AC
  Administered 2021-01-09: 120 mg via SUBCUTANEOUS
  Filled 2021-01-09: qty 1.7

## 2021-01-09 NOTE — Progress Notes (Signed)
Patient states he would like to up his dose or usage for gabapentin due to being in pain at night in his knees. Patient states he takes medication as instructed and by night time he is in pain and can't take any more for pain due to being afraid he will run out before his next refill.. The pain at night is also affecting his sleep.

## 2021-01-10 ENCOUNTER — Encounter: Payer: Self-pay | Admitting: Oncology

## 2021-01-15 ENCOUNTER — Other Ambulatory Visit: Payer: Self-pay | Admitting: Urology

## 2021-01-15 DIAGNOSIS — N401 Enlarged prostate with lower urinary tract symptoms: Secondary | ICD-10-CM

## 2021-01-22 ENCOUNTER — Other Ambulatory Visit: Payer: PPO

## 2021-01-23 ENCOUNTER — Other Ambulatory Visit: Payer: Self-pay

## 2021-01-23 ENCOUNTER — Ambulatory Visit
Admission: RE | Admit: 2021-01-23 | Discharge: 2021-01-23 | Disposition: A | Discharge status: Home / Self Care | Payer: PPO | Source: Ambulatory Visit | Attending: Oncology | Admitting: Oncology

## 2021-01-23 DIAGNOSIS — C61 Malignant neoplasm of prostate: Secondary | ICD-10-CM | POA: Diagnosis not present

## 2021-01-23 DIAGNOSIS — C7951 Secondary malignant neoplasm of bone: Secondary | ICD-10-CM | POA: Diagnosis not present

## 2021-01-23 MED ORDER — PIFLIFOLASTAT F 18 (PYLARIFY) INJECTION
9.0000 | Freq: Once | INTRAVENOUS | Status: AC
  Administered 2021-01-23: 9.45 via INTRAVENOUS

## 2021-01-25 ENCOUNTER — Inpatient Hospital Stay: Payer: PPO | Admitting: Oncology

## 2021-01-27 NOTE — Progress Notes (Signed)
[place]  Telephone:(336) [phone] Fax:(336) [phone]  ID: Earl Ramirez OB: [birth year]  MR#: [id number]  AOZ#:[id number]  Patient Care Team: Baxter Hire, MD as PCP - General (Internal Medicine) Murrell Redden, MD (Urology) [name] Javier Docker, MD (Cardiology) Lloyd Huger, MD as Consulting Physician (Oncology)  CHIEF COMPLAINT: Stage IVB prostate cancer.  INTERVAL HISTORY: Patient returns to clinic today for further evaluation, discussion of his imaging results, and initiation of Zytiga or Xtandi.  He continues to feel well and remains asymptomatic. He does not complain of pain today.  He has no neurologic complaints.  He denies any recent fevers or illnesses.  He has a good appetite and denies weight loss. He denies any pain.  He has no chest pain, shortness of breath, cough, or hemoptysis.  He denies any nausea, vomiting, constipation, or diarrhea.  He has no urinary complaints.  Patient offers no specific complaints today.  REVIEW OF SYSTEMS:   Review of Systems  Constitutional: Negative.  Negative for fever, malaise/fatigue and weight loss.  Respiratory: Negative.  Negative for cough, hemoptysis and shortness of breath.   Cardiovascular: Negative.  Negative for chest pain and leg swelling.  Gastrointestinal: Negative.  Negative for abdominal pain and constipation.  Genitourinary: Negative.  Negative for frequency.  Musculoskeletal: Negative.  Negative for back pain and neck pain.  Skin: Negative.  Negative for rash.  Neurological: Negative.  Negative for dizziness, focal weakness, weakness and headaches.  Psychiatric/Behavioral: Negative.  The patient is not nervous/anxious.    As per HPI. Otherwise, a complete review of systems is negative.  PAST MEDICAL HISTORY: Past Medical History:  Diagnosis Date   Aortic valve replaced    1998   Arthritis    Atrial fibrillation ([facility])    COPD (chronic obstructive pulmonary disease) ([facility])    Dysrhythmia     atrial fib   Hernia of abdominal wall    Hypertension    Indigestion    Neuropathy    Neuropathy    Prostate cancer ([place])    Sleep apnea    Thrombocytopenia ([facility])    Tremors of nervous system    Urinary problem     PAST SURGICAL HISTORY: Past Surgical History:  Procedure Laterality Date   CALDWELL LUC     tongue      growth removed   TRANSURETHRAL RESECTION OF PROSTATE N/A 01/18/2019   Procedure: CHANNEL TRANSURETHRAL RESECTION OF THE PROSTATE (TURP);  Surgeon: [name], MD;  Location: [facility] ORS;  Service: Urology;  Laterality: N/A;   VALVE REPLACEMENT      FAMILY HISTORY: Family History  Problem Relation Age of Onset   Heart attack Father    Alcohol abuse Father    Congestive Heart Failure Mother    Alzheimer's disease Sister     ADVANCED DIRECTIVES (Y/N):  N  HEALTH MAINTENANCE: Social History   Tobacco Use   Smoking status: Former    Pack years: 0.00    Types: Cigarettes    Quit date: 01/13/2001    Years since quitting: 20.0   Smokeless tobacco: Former    Quit date: 09/24/2000  Vaping Use   Vaping Use: Never used  Substance Use Topics   Alcohol use: Yes    Alcohol/week: 10.0 standard drinks    Types: 10 Cans of beer per week   Drug use: No     Colonoscopy:  PAP:  Bone density:  Lipid panel:  Allergies  Allergen Reactions   Oxycodone-Acetaminophen Nausea Only and Shortness  Of Breath   Oxycodone-Acetaminophen     Other reaction(s): Drowsy   Amoxicillin Itching    Hand itching   Penicillins Rash    Did it involve swelling of the face/tongue/throat, SOB, or low BP? No Did it involve sudden or severe rash/hives, skin peeling, or any reaction on the inside of your mouth or nose? No Did you need to seek medical attention at a hospital or doctor's office? No When did it last happen?      4-5 years ago If all above answers are "NO", may proceed with cephalosporin use.     Current Outpatient Medications  Medication Sig Dispense Refill    ALPRAZolam (XANAX) 0.25 MG tablet Take 0.25 mg by mouth daily as needed for anxiety.      aspirin EC 81 MG tablet Take 81 mg by mouth daily.      calcium carbonate (TUMS - DOSED IN MG ELEMENTAL CALCIUM) 500 MG chewable tablet Chew 1 tablet by mouth daily.      docusate sodium (COLACE) 100 MG capsule Take 1 capsule (100 mg total) by mouth 2 (two) times daily. 60 capsule 0   furosemide (LASIX) 20 MG tablet Take 20 mg by mouth daily.     gabapentin (NEURONTIN) 300 MG capsule Take 900 mg by mouth 4 (four) times daily.      metoprolol succinate (TOPROL-XL) 25 MG 24 hr tablet Take 25 mg by mouth daily.      tamsulosin (FLOMAX) 0.4 MG CAPS capsule Take 1 capsule (0.4 mg total) by mouth 2 (two) times daily. 90 capsule 1   sulfamethoxazole-trimethoprim (BACTRIM DS) 800-160 MG tablet Take 1 tablet by mouth every 12 (twelve) hours. (Patient not taking: No sig reported) 14 tablet 0   No current facility-administered medications for this visit.    OBJECTIVE: Vitals:   02/01/21 1351  BP: (!) 144/73  Pulse: 65  Resp: 18  Temp: (!) 97.2 F (36.2 C)     There is no height or weight on file to calculate BMI.    ECOG FS:0 - Asymptomatic  General: Well-developed, well-nourished, no acute distress.  Sitting in a wheelchair. Eyes: Pink conjunctiva, anicteric sclera. HEENT: Normocephalic, moist mucous membranes. Lungs: No audible wheezing or coughing. Heart: Regular rate and rhythm. Abdomen: Soft, nontender, no obvious distention. Musculoskeletal: No edema, cyanosis, or clubbing. Neuro: Alert, answering all questions appropriately. Cranial nerves grossly intact. Skin: No rashes or petechiae noted. Psych: Normal affect.   LAB RESULTS:  Lab Results  Component Value Date   NA 139 01/09/2021   K 4.3 01/09/2021   CL 107 01/09/2021   CO2 24 01/09/2021   GLUCOSE 93 01/09/2021   BUN 19 01/09/2021   CREATININE 0.64 01/09/2021   CALCIUM 8.7 (L) 01/09/2021   PROT 5.8 (L) 03/07/2020   ALBUMIN 3.7  03/07/2020   AST 38 03/07/2020   ALT 30 03/07/2020   ALKPHOS 47 03/07/2020   BILITOT 1.9 (H) 03/07/2020   GFRNONAA >60 01/09/2021   GFRAA >60 04/21/2020    Lab Results  Component Value Date   WBC 1.5 (L) 01/09/2021   NEUTROABS 1.0 (L) 01/09/2021   HGB 12.8 (L) 01/09/2021   HCT 38.5 (L) 01/09/2021   MCV 100.0 01/09/2021   PLT 39 (L) 01/09/2021     STUDIES: NM PET (PSMA) SKULL TO MID THIGH  Result Date: 01/24/2021 CLINICAL DATA:  Prostate carcinoma with biochemical recurrence. EXAM: NUCLEAR MEDICINE PET SKULL BASE TO THIGH TECHNIQUE: 9.45 mCi F18 Piflufolastat (Pylarify) was injected intravenously. Full-ring PET  imaging was performed from the skull base to thigh after the radiotracer. CT data was obtained and used for attenuation correction and anatomic localization. COMPARISON:  None. FINDINGS: NECK Intense radiotracer activity associated with a small RIGHT supraclavicular node measuring only 4 mm short axis (image 70) but with intense radiotracer activity (SUV 10.5). Similar intense activity associated with the posterior RIGHT posterior triangle neck lymph node measuring 3 mm (image 56) and SUV 3.3 Incidental CT finding: None CHEST Intense radiotracer activity associated RIGHT paratracheal and LEFT lower paratracheal nodes. Example RIGHT lower paratracheal lymph node measuring 9 mm short axis image 100) with SUV max 21.1. A similar RIGHT mid paratracheal node with SUV max 12. Within the RIGHT middle lobe rounded nodule measuring 13 mm unchanged prior does not have significant radiotracer Incidental CT finding: None ABDOMEN/PELVIS Prostate: No focal activity in prostate gland. Lymph nodes: No radiotracer avid deep pelvic lymph nodes. Radiotracer activity associated with a proximal LEFT common iliac node measuring 7 mm image 11 with SUV max equal 45. Chain of similar intense radiotracer avid small periaortic lymph nodes LEFT of the aorta. For example 8 mm node level of the LEFT kidney SUV max  equal 18.7. Liver: No evidence of liver metastasis Incidental CT finding: None SKELETON Multifocal intense radiotracer avid skeletal metastasis. Lesion again demonstrated anterior aspect of the RIGHT acetabulum at site of prior suspicious lesion with SUV max equal 37.1. Several additional rib lesions are present. For example anteromedial LEFT third rib with SUV max equal 20.5. Lesion in the mid sternum with SUV max equal 20.1. Lesion RIGHT fourth rib at the costovertebral junction (image 89) SUV max equal 21. There are 6 or 7 discrete bony metastasis. IMPRESSION: 1. Evidence of extensive radiotracer avid metastatic adenopathy involving the common iliac nodes, LEFT periaortic retroperitoneal nodes and multiple mediastinal nodes. Adenopathy extends into the RIGHT supraclavicular nodal station and posterior RIGHT neck. Several nodes are very small but with intense radiotracer activity. 2. Multifocal radiotracer avid skeletal metastasis with 6 or 7 discrete metastatic skeletal lesions Electronically Signed   By: Suzy Bouchard M.D.   On: 01/24/2021 15:04    ASSESSMENT: Stage IVB prostate cancer  PLAN:    1.  Stage IVB prostate cancer: Patient initially received XRT for local control disease in 2016. His PSA initially trended down to 5.37, but now has trended up and is 14.77.  But now is trending up slightly and is 7.25.  Pylarify PET scan completed on January 23, 2021 reviewed independently and reported as above with metastatic disease and bilateral common iliac nodes, retroperitoneal nodes, and multiple mediastinal lymph nodes.  Patient also noted to have avid 6 or 7 skeletal metastasis.  Patient has agreed to initiate Zytiga or Xtandi over the next 1 to 2 weeks. He last received Eligard on February 18, 2020.  He last received Xgeva on January 09, 2021.  Return to clinic in 1 month to assess his toleration of treatment and continuation of Xgeva.   2.  Pancytopenia: Chronic and unchanged.  Continue to monitor closely.   Can consider bone marrow biopsy in the future if necessary. 3.  Hypocalcemia: Nearly resolved.  Continue oral calcium supplementation.   I spent a total of 30 minutes reviewing chart data, face-to-face evaluation with the patient, counseling and coordination of care as detailed above.  Patient expressed understanding and was in agreement with this plan. He also understands that He can call clinic at any time with any questions, concerns, or complaints.   Cancer  Staging Prostate cancer [facility]) Staging form: Prostate, AJCC 8th Edition - Clinical: Stage IVB (cTX, cN0, pM1b, PSA: 7.6) - Signed by Lloyd Huger, MD on 10/14/2019 Prostate specific antigen (PSA) range: Less than 10   Lloyd Huger, MD   02/02/2021 3:49 PM

## 2021-02-01 ENCOUNTER — Other Ambulatory Visit: Payer: Self-pay

## 2021-02-01 ENCOUNTER — Inpatient Hospital Stay (HOSPITAL_BASED_OUTPATIENT_CLINIC_OR_DEPARTMENT_OTHER): Payer: PPO | Admitting: Oncology

## 2021-02-01 VITALS — BP 144/73 | HR 65 | Temp 97.2°F | Resp 18

## 2021-02-01 DIAGNOSIS — C61 Malignant neoplasm of prostate: Secondary | ICD-10-CM | POA: Diagnosis not present

## 2021-02-01 NOTE — Progress Notes (Signed)
Patient denies new problems/concerns today.   °

## 2021-02-02 ENCOUNTER — Encounter: Payer: Self-pay | Admitting: Oncology

## 2021-02-02 ENCOUNTER — Telehealth: Payer: Self-pay | Admitting: Pharmacy Technician

## 2021-02-02 NOTE — Telephone Encounter (Signed)
Oral Oncology Patient Advocate Encounter   Received notification from [employer] that prior authorization for Earl Ramirez is required.   PA submitted on CoverMyMeds on 02/02/21 Key [id number] Status is pending   Oral Oncology Clinic will continue to follow.  [place] Patient [place] Phone [phone] Fax [phone]

## 2021-02-05 DIAGNOSIS — G4733 Obstructive sleep apnea (adult) (pediatric): Secondary | ICD-10-CM | POA: Diagnosis not present

## 2021-02-05 DIAGNOSIS — I872 Venous insufficiency (chronic) (peripheral): Secondary | ICD-10-CM | POA: Diagnosis not present

## 2021-02-06 ENCOUNTER — Encounter: Payer: Self-pay | Admitting: Oncology

## 2021-02-06 ENCOUNTER — Telehealth: Payer: Self-pay | Admitting: Pharmacist

## 2021-02-06 ENCOUNTER — Other Ambulatory Visit (HOSPITAL_COMMUNITY): Payer: Self-pay

## 2021-02-06 DIAGNOSIS — C61 Malignant neoplasm of prostate: Secondary | ICD-10-CM

## 2021-02-06 NOTE — Telephone Encounter (Signed)
Oral Oncology Patient Advocate Encounter  Prior Authorization for Earl Ramirez has been approved.    PA# [id number] Effective dates: 02/02/21 through 02/02/22  Patients co-pay is $93.06  Oral Oncology Clinic will continue to follow.   [name] Patient [name] Phone [phone] Fax [phone] 02/06/2021 8:48 AM

## 2021-02-06 NOTE — Telephone Encounter (Signed)
Oral Oncology Pharmacist Encounter  Received new prescription for Xtandi (enzalutamide) for the treatment of metastatic castrate resistant prostate cancer in conjunction with ADT, planned duration until disease progression or unacceptable drug toxicity.  Prescription dose and frequency assessed.   Current medication list in Epic reviewed, one DDIs with enzalutamide identified: Alprazolam: Enzalutamide may decrease the serum concentration of alprazolam. Monitor for reduced alprazolam efficacy. No baseline dose adjustment needed.  Evaluated chart and no patient barriers to medication adherence identified.   Prescription has been e-scribed to the [facility] for benefits analysis and approval.  Oral Oncology Clinic will continue to follow for insurance authorization, copayment issues, initial counseling and start date.  Patient agreed to treatment on 02/01/21 per MD documentation.  Darl Pikes, PharmD, BCPS, BCOP, CPP Hematology/Oncology Clinical Pharmacist Practitioner [facility] [place] [facility] [phone]  02/06/2021 2:49 PM

## 2021-02-08 ENCOUNTER — Other Ambulatory Visit (HOSPITAL_COMMUNITY): Payer: Self-pay

## 2021-02-08 MED ORDER — ENZALUTAMIDE 40 MG PO TABS: 160.0000 mg | ORAL_TABLET | Freq: Every day | ORAL | 2 refills | Status: DC

## 2021-02-08 NOTE — Telephone Encounter (Signed)
Oral Oncology Patient Advocate Encounter   Received notification from [employer] that prior authorization for Earl Ramirez is required.   PA submitted on CoverMyMeds Key [id number] Status is pending   Oral Oncology Clinic will continue to follow.  [place] Patient [name] Phone [phone] Fax [phone] 02/08/2021 11:46 AM

## 2021-02-08 NOTE — Telephone Encounter (Signed)
Oral Oncology Patient Advocate Encounter  Prior Authorization for Gillermina Phy has been approved.    PA# [id number] Effective dates: 02/08/21 through 02/08/22  Patients co-pay is $2959.31.  Oral Oncology Clinic will continue to follow.   [place] Patient [place] Phone [phone] Fax [phone] 02/08/2021 11:49 AM

## 2021-02-09 IMAGING — CT NUCLEAR MEDICINE PET IMAGE INITIAL (PI) SKULL BASE TO THIGH
1 of 9 series · 3 of 16 positions shown, 4 images · non-contrast
Comparison: Chest CT 11/30/2018

CLINICAL DATA: initial treatment strategy for tumor type.

EXAM:
NUCLEAR MEDICINE PET SKULL BASE TO THIGH
TECHNIQUE: 12.9 mCi F-18 FDG was injected intravenously. Full-ring PET imaging
was performed from the skull base to thigh after the radiotracer. CT
data was obtained and used for attenuation correction and anatomic
localization.
Fasting blood glucose: 83 mg/dl

[Series 3: ct wb 5.0 b30f · axial · 0.98mm/px · z∈[-1190,-206]mm · 3 of 329 slices shown, 4 images]
[im 1/329  soft-tissue]
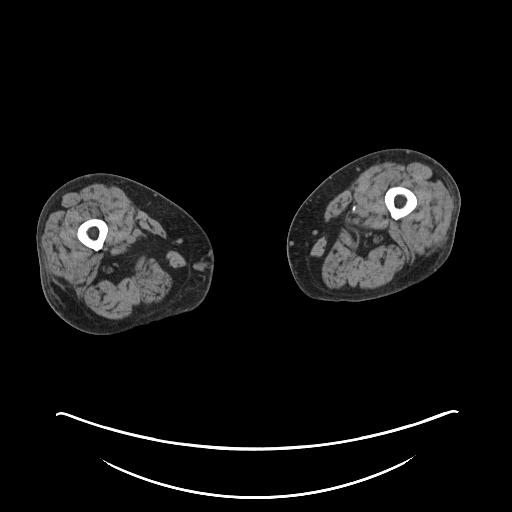
[im 1/329  bone]
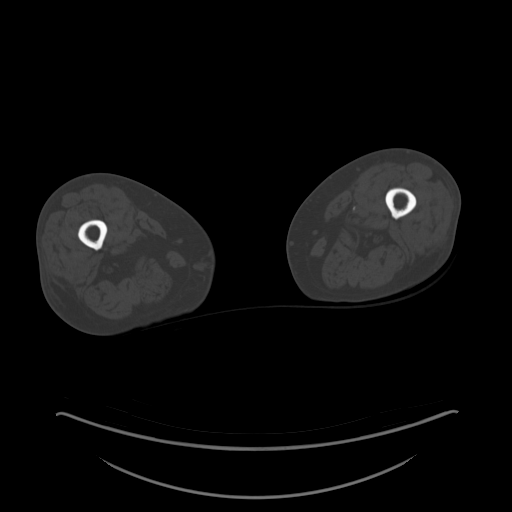
[im 165/329  soft-tissue]
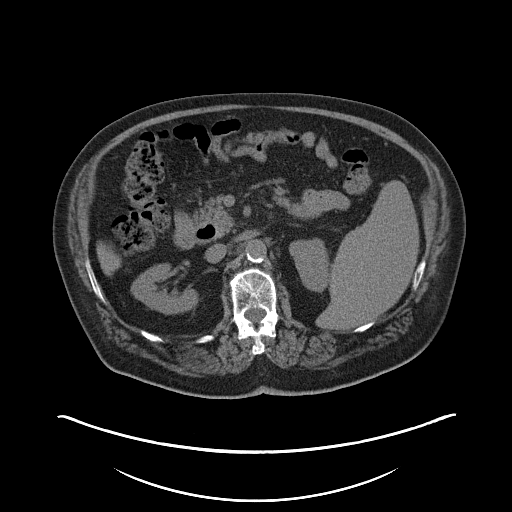
[im 329/329  soft-tissue]
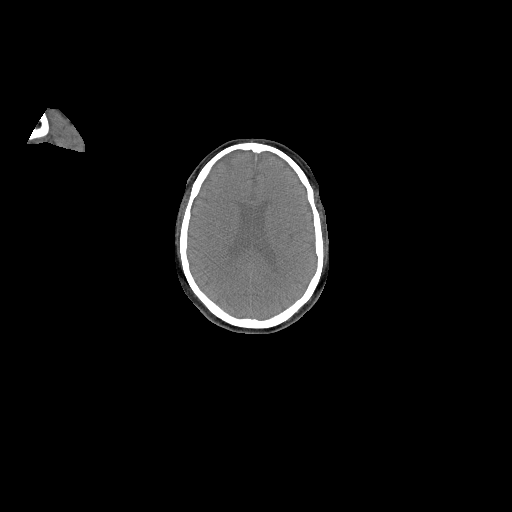

[3 of 16 positions shown; findings below may reference images not displayed]

FINDINGS: Mediastinal blood pool activity: SUV max

NECK: No hypermetabolic lymph nodes in the neck. Hypermetabolic
nodule in the RIGHT lobe of the thyroid gland measures 16 mm with
SUV max equal 5.3.

Incidental CT findings: none

CHEST: Nodule within the RIGHT middle lobe measures 16 mm (image
114/3) has low metabolic activity SUV max equal 2.1.

No hypermetabolic mediastinal lymph nodes.

Incidental CT findings: Coronary artery calcification and aortic
atherosclerotic calcification.

ABDOMEN/PELVIS: No abnormal hypermetabolic activity within the
liver, pancreas, adrenal glands, or spleen. No hypermetabolic lymph
nodes in the abdomen or pelvis.

Incidental CT findings: Spleen is enlarged measuring 17 cm in
craniocaudad dimension. Several large gallstones are present.

SKELETON: No focal hypermetabolic activity to suggest skeletal
metastasis.

Incidental CT findings: none
IMPRESSION: 1. Relatively low metabolic activity associated with the RIGHT
middle lobe pulmonary nodule favors benign etiology. Recommend
follow-up noncontrast CT exam in 3 months to evaluate size stability
as certain neoplasms (e.g. carcinoid) can have low metabolic
activity.
2. No mediastinal lymphadenopathy.
3. Hypermetabolic nodule in the RIGHT lobe of thyroid gland.
Recommend ultrasound of the thyroid gland to evaluate for incidental
thyroid carcinoma.
4. Incidental findings of splenomegaly and cholelithiasis.

These results will be called to the ordering clinician or
representative by the Radiologist Assistant, and communication
documented in the PACS or zVision Dashboard.

## 2021-02-14 ENCOUNTER — Encounter: Payer: Self-pay | Admitting: Oncology

## 2021-02-14 ENCOUNTER — Other Ambulatory Visit (HOSPITAL_COMMUNITY): Payer: Self-pay

## 2021-02-14 ENCOUNTER — Telehealth: Payer: Self-pay | Admitting: Pharmacy Technician

## 2021-02-14 NOTE — Telephone Encounter (Signed)
Oral Oncology Patient Advocate Encounter   Was successful in securing patient an [phone] grant from [facility] [place] [facility]) to provide copayment coverage for Xtandi.  This will keep the out of pocket expense at $0.     I have spoken with the patient.    The billing information is as follows and has been shared with [place].   Member ID: [id number] Group ID: [id number] RxBin: [id number] Dates of Eligibility: 11/16/20 through 02/13/22  Fund:  [place] [facility] [place] Phone [phone] Fax [phone] 02/14/2021 4:03 PM

## 2021-02-15 ENCOUNTER — Telehealth: Payer: Self-pay | Admitting: Pharmacist

## 2021-02-15 ENCOUNTER — Other Ambulatory Visit (HOSPITAL_COMMUNITY): Payer: Self-pay

## 2021-02-15 MED ORDER — ENZALUTAMIDE 40 MG PO TABS
160.0000 mg | ORAL_TABLET | Freq: Every day | ORAL | 2 refills | Status: DC
  Filled 2021-02-15: qty 120, 30d supply, fill #0
  Filled 2021-03-06: qty 120, 30d supply, fill #1
  Filled 2021-04-03: qty 120, 30d supply, fill #2

## 2021-02-15 NOTE — Telephone Encounter (Signed)
Oral Chemotherapy Pharmacist Encounter  Earl Ramirez will deliver his Xtandi on 02/16/21. Earl Ramirez will start when he has his medication in hand. His clinic f/u appt is scheduled for 03/06/21.  Patient Education I spoke with patient for overview of new oral chemotherapy medication: Xtandi (enzalutamide) for the treatment of metastatic castrate resistant prostate cancer in conjunction with ADT, planned duration until disease progression or unacceptable drug toxicity.   Pt is doing well. Counseled patient on administration, dosing, side effects, monitoring, drug-food interactions, safe handling, storage, and disposal. Patient will take 4 tablets (160 mg total) by mouth daily.  Side effects include but not limited to: fatigue and HTN.    Reviewed with patient importance of keeping a medication schedule and plan for any missed doses.  After discussion with patient no patient barriers to medication adherence identified.   Earl Ramirez voiced understanding and appreciation. All questions answered. Medication handout provided.  Provided patient with [facility] phone number. Patient knows to call the office with questions or concerns. Oral Chemotherapy Navigation [facility] will continue to follow.  Darl Pikes, PharmD, BCPS, BCOP, CPP Hematology/Oncology Clinical Pharmacist Practitioner [facility] [phone]  02/15/2021 2:13 PM

## 2021-02-23 NOTE — Telephone Encounter (Signed)
Patient will need to submit income information to [employer] for verification.  Patient is to mail income documents and application back to me.

## 2021-02-27 ENCOUNTER — Other Ambulatory Visit (HOSPITAL_COMMUNITY): Payer: Self-pay

## 2021-03-04 NOTE — Progress Notes (Signed)
[name]  [phone] Fax:(336) [phone]  ID: Earl Ramirez OB: March 01, 1941  MR#: [id number]  ACZ#:[id number]  Patient Care Team: Baxter Hire, MD as PCP - General (Internal Medicine) Murrell Redden, MD (Urology) [name] Javier Docker, MD (Cardiology) Lloyd Huger, MD as Consulting Physician (Oncology)  CHIEF COMPLAINT: Stage IVB prostate cancer.  INTERVAL HISTORY: Patient returns to clinic today for repeat laboratory work, continuation of Xgeva, and to assess his toleration of Xtandi.  His treatment start was delayed several weeks secondary to financial reasons.  He currently feels well and is asymptomatic.  He is tolerating Xtandi well without significant side effects.  He does not complain of pain today.  He has no neurologic complaints.  He denies any recent fevers or illnesses.  He has a good appetite and denies weight loss. He denies any pain.  He has no chest pain, shortness of breath, cough, or hemoptysis.  He denies any nausea, vomiting, constipation, or diarrhea.  He has noted increased urinary frequency which he equates with a possible UTI.  Patient offers no specific complaints today.  REVIEW OF SYSTEMS:   Review of Systems  Constitutional: Negative.  Negative for fever, malaise/fatigue and weight loss.  Respiratory: Negative.  Negative for cough, hemoptysis and shortness of breath.   Cardiovascular: Negative.  Negative for chest pain and leg swelling.  Gastrointestinal: Negative.  Negative for abdominal pain and constipation.  Genitourinary:  Positive for frequency.  Musculoskeletal: Negative.  Negative for back pain and neck pain.  Skin: Negative.  Negative for rash.  Neurological: Negative.  Negative for dizziness, focal weakness, weakness and headaches.  Psychiatric/Behavioral: Negative.  The patient is not nervous/anxious.    As per HPI. Otherwise, a complete review of systems is negative.  PAST MEDICAL HISTORY: Past Medical  History:  Diagnosis Date   Aortic valve replaced    1998   Arthritis    Atrial fibrillation (HCC)    COPD (chronic obstructive pulmonary disease) (HCC)    Dysrhythmia    atrial fib   Hernia of abdominal wall    Hypertension    Indigestion    Neuropathy    Neuropathy    Prostate cancer ([name])    Sleep apnea    Thrombocytopenia (HCC)    Tremors of nervous system    Urinary problem     PAST SURGICAL HISTORY: Past Surgical History:  Procedure Laterality Date   CALDWELL LUC     tongue      growth removed   TRANSURETHRAL RESECTION OF PROSTATE N/A 01/18/2019   Procedure: CHANNEL TRANSURETHRAL RESECTION OF THE PROSTATE (TURP);  Surgeon: [name], MD;  Location: [facility] ORS;  Service: Urology;  Laterality: N/A;   VALVE REPLACEMENT      FAMILY HISTORY: Family History  Problem Relation Age of Onset   Heart attack Father    Alcohol abuse Father    Congestive Heart Failure Mother    Alzheimer's disease Sister     ADVANCED DIRECTIVES (Y/N):  N  HEALTH MAINTENANCE: Social History   Tobacco Use   Smoking status: Former    Types: Cigarettes    Quit date: 01/13/2001    Years since quitting: 20.1   Smokeless tobacco: Former    Quit date: 09/24/2000  Vaping Use   Vaping Use: Never used  Substance Use Topics   Alcohol use: Yes    Alcohol/week: 10.0 standard drinks    Types: 10 Cans of beer per week   Drug use: No  Colonoscopy:  PAP:  Bone density:  Lipid panel:  Allergies  Allergen Reactions   Oxycodone-Acetaminophen Nausea Only and Shortness Of Breath   Oxycodone-Acetaminophen     Other reaction(s): Drowsy   Amoxicillin Itching    Hand itching   Penicillins Rash    Did it involve swelling of the face/tongue/throat, SOB, or low BP? No Did it involve sudden or severe rash/hives, skin peeling, or any reaction on the inside of your mouth or nose? No Did you need to seek medical attention at a hospital or doctor's office? No When did it last happen?      4-5  years ago If all above answers are "NO", may proceed with cephalosporin use.     Current Outpatient Medications  Medication Sig Dispense Refill   ALPRAZolam (XANAX) 0.25 MG tablet Take 0.25 mg by mouth daily as needed for anxiety.      aspirin EC 81 MG tablet Take 81 mg by mouth daily.      calcium carbonate (TUMS - DOSED IN MG ELEMENTAL CALCIUM) 500 MG chewable tablet Chew 1 tablet by mouth daily.      docusate sodium (COLACE) 100 MG capsule Take 1 capsule (100 mg total) by mouth 2 (two) times daily. 60 capsule 0   enzalutamide (XTANDI) 40 MG tablet Take 4 tablets (160 mg total) by mouth daily. 120 tablet 2   furosemide (LASIX) 20 MG tablet Take 20 mg by mouth daily.     gabapentin (NEURONTIN) 300 MG capsule Take 900 mg by mouth 4 (four) times daily.      metoprolol succinate (TOPROL-XL) 25 MG 24 hr tablet Take 25 mg by mouth daily.      tamsulosin (FLOMAX) 0.4 MG CAPS capsule Take 1 capsule (0.4 mg total) by mouth 2 (two) times daily. 90 capsule 1   sulfamethoxazole-trimethoprim (BACTRIM DS) 800-160 MG tablet Take 1 tablet by mouth every 12 (twelve) hours. (Patient not taking: No sig reported) 14 tablet 0   No current facility-administered medications for this visit.    OBJECTIVE: Vitals:   03/06/21 1338  BP: (!) 152/70  Pulse: 85  Resp: 16  Temp: 98 F (36.7 C)     Body mass index is 37.71 kg/m.    ECOG FS:1 - Symptomatic but completely ambulatory  General: Well-developed, well-nourished, no acute distress.  Sitting in wheelchair. Eyes: Pink conjunctiva, anicteric sclera. HEENT: Normocephalic, moist mucous membranes. Lungs: No audible wheezing or coughing. Heart: Regular rate and rhythm. Abdomen: Soft, nontender, no obvious distention. Musculoskeletal: No edema, cyanosis, or clubbing. Neuro: Alert, answering all questions appropriately. Cranial nerves grossly intact. Skin: No rashes or petechiae noted. Psych: Normal affect.   LAB RESULTS:  Lab Results  Component  Value Date   NA 141 03/06/2021   K 4.1 03/06/2021   CL 108 03/06/2021   CO2 26 03/06/2021   GLUCOSE 107 (H) 03/06/2021   BUN 18 03/06/2021   CREATININE 0.64 03/06/2021   CALCIUM 7.7 (L) 03/06/2021   PROT 5.8 (L) 03/07/2020   ALBUMIN 3.7 03/07/2020   AST 38 03/07/2020   ALT 30 03/07/2020   ALKPHOS 47 03/07/2020   BILITOT 1.9 (H) 03/07/2020   GFRNONAA >60 03/06/2021   GFRAA >60 04/21/2020    Lab Results  Component Value Date   WBC 1.9 (L) 03/06/2021   NEUTROABS 1.2 (L) 03/06/2021   HGB 12.4 (L) 03/06/2021   HCT 37.4 (L) 03/06/2021   MCV 102.2 (H) 03/06/2021   PLT 38 (L) 03/06/2021     STUDIES:  No results found.  ASSESSMENT: Stage IVB prostate cancer  PLAN:    1.  Stage IVB prostate cancer: Patient initially received XRT for local control disease in 2016. His PSA initially trended down to 5.37, but then trended up to 14.77. Pylarify PET scan completed on January 23, 2021 reviewed independently with metastatic disease and bilateral common iliac nodes, retroperitoneal nodes, and multiple mediastinal lymph nodes.  Patient also noted to have avid 6 or 7 skeletal metastasis.  Patient initiated Xtandi approximately 2 weeks ago and is tolerating treatment well.  We will continue current dose until intolerable side effects or progression of disease.  He last received Eligard on February 18, 2020.  He last received Xgeva on January 09, 2021.  Hold Xgeva today secondary to hypocalcemia.  Return to clinic in 4 weeks for further evaluation and continuation of treatment. 2.  Pancytopenia: Chronic and unchanged.  Continue to monitor closely.  Can consider bone marrow biopsy in the future if necessary. 3.  Hypocalcemia: Hold Xgeva as above.  Continue oral calcium supplementation. 4.  UTI: Patient was called in a prescription for Bactrim.   I spent a total of 30 minutes reviewing chart data, face-to-face evaluation with the patient, counseling and coordination of care as detailed above.  Patient  expressed understanding and was in agreement with this plan. He also understands that He can call clinic at any time with any questions, concerns, or complaints.   Cancer Staging Prostate cancer [facility]) Staging form: Prostate, AJCC 8th Edition - Clinical: Stage IVB (cTX, cN0, pM1b, PSA: 7.6) - Signed by Lloyd Huger, MD on 10/14/2019 Prostate specific antigen (PSA) range: Less than 10   Lloyd Huger, MD   03/06/2021 4:00 PM

## 2021-03-05 NOTE — Telephone Encounter (Signed)
Oral Oncology Patient Advocate Encounter  Was successful in securing patient an $45 grant from [employer] to provide copayment coverage for [employer]  This will keep the out of pocket expense at $0.     [employer] ID: [id number]  I have spoken with the patient.   The billing information is as follows and has been shared with [place].    RxBin: [id number] PCN: [id number] Member ID: [id number] Group ID: [id number] Dates of Eligibility: 01/20/21 through 01/19/22  Fund:  [place] [facility] [place] Phone [phone] Fax [phone] 03/05/2021 2:41 PM

## 2021-03-06 ENCOUNTER — Telehealth: Payer: Self-pay

## 2021-03-06 ENCOUNTER — Inpatient Hospital Stay: Payer: PPO | Attending: Oncology

## 2021-03-06 ENCOUNTER — Encounter: Payer: Self-pay | Admitting: Oncology

## 2021-03-06 ENCOUNTER — Inpatient Hospital Stay: Payer: PPO | Admitting: Pharmacist

## 2021-03-06 ENCOUNTER — Inpatient Hospital Stay: Payer: PPO

## 2021-03-06 ENCOUNTER — Other Ambulatory Visit (HOSPITAL_COMMUNITY): Payer: Self-pay

## 2021-03-06 ENCOUNTER — Inpatient Hospital Stay (HOSPITAL_BASED_OUTPATIENT_CLINIC_OR_DEPARTMENT_OTHER): Payer: PPO | Admitting: Oncology

## 2021-03-06 VITALS — BP 152/70 | HR 85 | Temp 98.0°F | Resp 16 | Wt 240.8 lb

## 2021-03-06 DIAGNOSIS — C61 Malignant neoplasm of prostate: Secondary | ICD-10-CM | POA: Diagnosis not present

## 2021-03-06 DIAGNOSIS — C7951 Secondary malignant neoplasm of bone: Secondary | ICD-10-CM | POA: Insufficient documentation

## 2021-03-06 DIAGNOSIS — J449 Chronic obstructive pulmonary disease, unspecified: Secondary | ICD-10-CM | POA: Diagnosis not present

## 2021-03-06 DIAGNOSIS — Z79899 Other long term (current) drug therapy: Secondary | ICD-10-CM | POA: Diagnosis not present

## 2021-03-06 DIAGNOSIS — I4891 Unspecified atrial fibrillation: Secondary | ICD-10-CM | POA: Diagnosis not present

## 2021-03-06 DIAGNOSIS — I10 Essential (primary) hypertension: Secondary | ICD-10-CM | POA: Diagnosis not present

## 2021-03-06 DIAGNOSIS — G4730 Sleep apnea, unspecified: Secondary | ICD-10-CM | POA: Insufficient documentation

## 2021-03-06 DIAGNOSIS — D61818 Other pancytopenia: Secondary | ICD-10-CM | POA: Diagnosis not present

## 2021-03-06 DIAGNOSIS — Z87891 Personal history of nicotine dependence: Secondary | ICD-10-CM | POA: Insufficient documentation

## 2021-03-06 DIAGNOSIS — K449 Diaphragmatic hernia without obstruction or gangrene: Secondary | ICD-10-CM | POA: Diagnosis not present

## 2021-03-06 DIAGNOSIS — M129 Arthropathy, unspecified: Secondary | ICD-10-CM | POA: Insufficient documentation

## 2021-03-06 DIAGNOSIS — Z7982 Long term (current) use of aspirin: Secondary | ICD-10-CM | POA: Diagnosis not present

## 2021-03-06 DIAGNOSIS — G629 Polyneuropathy, unspecified: Secondary | ICD-10-CM | POA: Insufficient documentation

## 2021-03-06 DIAGNOSIS — R300 Dysuria: Secondary | ICD-10-CM | POA: Diagnosis not present

## 2021-03-06 DIAGNOSIS — D696 Thrombocytopenia, unspecified: Secondary | ICD-10-CM | POA: Diagnosis not present

## 2021-03-06 DIAGNOSIS — N390 Urinary tract infection, site not specified: Secondary | ICD-10-CM | POA: Insufficient documentation

## 2021-03-06 LAB — BASIC METABOLIC PANEL
Anion gap: 7 (ref 5–15)
BUN: 18 mg/dL (ref 8–23)
CO2: 26 mmol/L (ref 22–32)
Calcium: 7.7 mg/dL — ABNORMAL LOW (ref 8.9–10.3)
Chloride: 108 mmol/L (ref 98–111)
Creatinine, Ser: 0.64 mg/dL (ref 0.61–1.24)
GFR, Estimated: >60 mL/min (ref >60)
Glucose, Bld: 107 mg/dL — ABNORMAL HIGH (ref 70–99)
Potassium: 4.1 mmol/L (ref 3.5–5.1)
Sodium: 141 mmol/L (ref 135–145)

## 2021-03-06 LAB — URINALYSIS, COMPLETE (UACMP) WITH MICROSCOPIC
Bacteria, UA: NONE SEEN
Bilirubin Urine: NEGATIVE
Glucose, UA: NEGATIVE mg/dL
Ketones, ur: NEGATIVE mg/dL
Nitrite: POSITIVE — AB
Protein, ur: 100 mg/dL — AB
Specific Gravity, Urine: 1.018 (ref 1.005–1.030)
Squamous Epithelial / HPF: NONE SEEN (ref 0–5)
WBC, UA: >50 WBC/hpf — ABNORMAL HIGH (ref 0–5)
pH: 6 (ref 5.0–8.0)

## 2021-03-06 LAB — CBC WITH DIFFERENTIAL/PLATELET
Abs Immature Granulocytes: 0.01 10*3/uL (ref 0.00–0.07)
Basophils Absolute: 0 10*3/uL (ref 0.0–0.1)
Basophils Relative: 1 %
Eosinophils Absolute: 0 10*3/uL (ref 0.0–0.5)
Eosinophils Relative: 2 %
HCT: 37.4 % — ABNORMAL LOW (ref 39.0–52.0)
Hemoglobin: 12.4 g/dL — ABNORMAL LOW (ref 13.0–17.0)
Immature Granulocytes: 1 %
Lymphocytes Relative: 21 %
Lymphs Abs: 0.4 10*3/uL — ABNORMAL LOW (ref 0.7–4.0)
MCH: 33.9 pg (ref 26.0–34.0)
MCHC: 33.2 g/dL (ref 30.0–36.0)
MCV: 102.2 fL — ABNORMAL HIGH (ref 80.0–100.0)
Monocytes Absolute: 0.2 10*3/uL (ref 0.1–1.0)
Monocytes Relative: 9 %
Neutro Abs: 1.2 10*3/uL — ABNORMAL LOW (ref 1.7–7.7)
Neutrophils Relative %: 66 %
Platelets: 38 10*3/uL — ABNORMAL LOW (ref 150–400)
RBC: 3.66 MIL/uL — ABNORMAL LOW (ref 4.22–5.81)
RDW: 14.6 % (ref 11.5–15.5)
WBC: 1.9 10*3/uL — ABNORMAL LOW (ref 4.0–10.5)
nRBC: 0 % (ref 0.0–0.2)

## 2021-03-06 LAB — PSA: Prostatic Specific Antigen: 1.16 ng/mL (ref 0.00–4.00)

## 2021-03-06 MED ORDER — SULFAMETHOXAZOLE-TRIMETHOPRIM 800-160 MG PO TABS: 1.0000 | ORAL_TABLET | Freq: Two times a day (BID) | ORAL | 0 refills | Status: DC

## 2021-03-06 NOTE — Telephone Encounter (Signed)
Patient notified Dr. [name] has received urine results and he has sent in rx for Bactrim to his pharmacy for UTI.

## 2021-03-06 NOTE — Progress Notes (Signed)
Patient has new left elbow pain for the past 5 weeks.  Pain will worsen if he "bumps" on anything.  Thinks he may have a UTI due to an increase in urinary incontinence (leakage) with occasional dysuria which are his usual symptoms of UTI.

## 2021-03-06 NOTE — Progress Notes (Signed)
[place]  Telephone:([phone] [phone]  Patient Care Team: Baxter Hire, MD as PCP - General (Internal Medicine) Murrell Redden, MD (Urology) [name] Javier Docker, MD (Cardiology) Lloyd Huger, MD as Consulting Physician (Oncology)   Name of the patient: Earl Ramirez  [id number]  01-31-1941   Date of visit: 03/06/21  HPI: Patient is a 80 y.o. male with metastatic castrate resistant prostate cancer. Xtandi (enzalutamide) was added to his ADT therapy on 02/16/21.  Reason for Consult: Oral chemotherapy follow-up for Xtandi (enzalutamide) therapy.   PAST MEDICAL HISTORY: Past Medical History:  Diagnosis Date   Aortic valve replaced    1998   Arthritis    Atrial fibrillation (HCC)    COPD (chronic obstructive pulmonary disease) (HCC)    Dysrhythmia    atrial fib   Hernia of abdominal wall    Hypertension    Indigestion    Neuropathy    Neuropathy    Prostate cancer (HCC)    Sleep apnea    Thrombocytopenia (HCC)    Tremors of nervous system    Urinary problem     HEMATOLOGY/ONCOLOGY HISTORY:  Oncology History  Prostate cancer ([place])  01/18/2019 Initial Diagnosis   Prostate cancer ([place])    10/14/2019 Cancer Staging   Staging form: Prostate, AJCC 8th Edition - Clinical: Stage IVB (cTX, cN0, pM1b, PSA: 7.6) - Signed by Lloyd Huger, MD on 10/14/2019      ALLERGIES:  is allergic to oxycodone-acetaminophen, oxycodone-acetaminophen, amoxicillin, and penicillins.  MEDICATIONS:  Current Outpatient Medications  Medication Sig Dispense Refill   ALPRAZolam (XANAX) 0.25 MG tablet Take 0.25 mg by mouth daily as needed for anxiety.      aspirin EC 81 MG tablet Take 81 mg by mouth daily.      calcium carbonate (TUMS - DOSED IN MG ELEMENTAL CALCIUM) 500 MG chewable tablet Chew 1 tablet by mouth daily.      docusate sodium (COLACE) 100 MG capsule Take 1 capsule (100 mg total) by mouth 2 (two) times daily.  60 capsule 0   enzalutamide (XTANDI) 40 MG tablet Take 4 tablets (160 mg total) by mouth daily. 120 tablet 2   furosemide (LASIX) 20 MG tablet Take 20 mg by mouth daily.     gabapentin (NEURONTIN) 300 MG capsule Take 900 mg by mouth 4 (four) times daily.      metoprolol succinate (TOPROL-XL) 25 MG 24 hr tablet Take 25 mg by mouth daily.      sulfamethoxazole-trimethoprim (BACTRIM DS) 800-160 MG tablet Take 1 tablet by mouth every 12 (twelve) hours. (Patient not taking: No sig reported) 14 tablet 0   tamsulosin (FLOMAX) 0.4 MG CAPS capsule Take 1 capsule (0.4 mg total) by mouth 2 (two) times daily. 90 capsule 1   No current facility-administered medications for this visit.    VITAL SIGNS: There were no vitals taken for this visit. There were no vitals filed for this visit.  Estimated body mass index is 38.06 kg/m as calculated from the following:   Height as of 03/23/20: '5\' 7"'$  (1.702 m).   Weight as of 01/09/21: 110.2 kg (243 lb).  LABS: CBC:    Component Value Date/Time   WBC 1.5 (L) 01/09/2021 1350   HGB 12.8 (L) 01/09/2021 1350   HGB 15.1 05/06/2013 1613   HCT 38.5 (L) 01/09/2021 1350   HCT 44.6 05/06/2013 1613   PLT 39 (L) 01/09/2021 1350   PLT 88 (L) 05/06/2013 1613  MCV 100.0 01/09/2021 1350   MCV 100 05/06/2013 1613   NEUTROABS 1.0 (L) 01/09/2021 1350   NEUTROABS 2.3 05/06/2013 1613   LYMPHSABS 0.4 (L) 01/09/2021 1350   LYMPHSABS 1.1 05/06/2013 1613   MONOABS 0.1 01/09/2021 1350   MONOABS 0.3 05/06/2013 1613   EOSABS 0.0 01/09/2021 1350   EOSABS 0.1 05/06/2013 1613   BASOSABS 0.0 01/09/2021 1350   BASOSABS 0.0 05/06/2013 1613   Comprehensive Metabolic Panel:    Component Value Date/Time   NA 139 01/09/2021 1350   K 4.3 01/09/2021 1350   CL 107 01/09/2021 1350   CO2 24 01/09/2021 1350   BUN 19 01/09/2021 1350   CREATININE 0.64 01/09/2021 1350   GLUCOSE 93 01/09/2021 1350   CALCIUM 8.7 (L) 01/09/2021 1350   AST 38 03/07/2020 1449   ALT 30 03/07/2020 1449    ALKPHOS 47 03/07/2020 1449   BILITOT 1.9 (H) 03/07/2020 1449   PROT 5.8 (L) 03/07/2020 1449   ALBUMIN 3.7 03/07/2020 1449     Present during today's visit: patient only  Assessment and Plan: Continue Enzalutamide '160mg'$  daily. Continue to monitor BP.    Oral Chemotherapy Side Effect/Intolerance:  HTN: BP elevated from his normal range at his appt today. Patient thinks that his BP is higher today due to increased hurdles getting to his appt today. His brother usually takes him but he was unavailable today. He reports one "little" headache for about 45 mins yesterday but no dizziness. No headache or dizziness currently. He does not have a blood pressure cuff at home. Suggested that if his BP is elevated again at this next office visit he should use a BP cuff at home so we know his home readings. Constipation: Reported an increase in his constipation since starting his enzalutamide. He does not take an medications for constipation currently, but he does have some docusate at home he will begin taking this for his constipation. No reported fatigue and no more increase in edema compared to his normal  Oral Chemotherapy Adherence: no missed doses reported No patient barriers to medication adherence identified.   New medications: none reported  Medication Access Issues: no issues, fills at [facility]. [facility] was to give him a call later this week for medication refill. Refill coordinated during his office visit today to streamline care.   Patient expressed understanding and was in agreement with this plan. He also understands that He can call clinic at any time with any questions, concerns, or complaints.   Follow-up plan: f/u in clinic in 4 weeks  Thank you for allowing me to participate in the care of this very pleasant patient.   Time Total: 15 mins  Visit consisted of counseling and education on dealing with issues of symptom management in the setting of serious and potentially  life-threatening illness.Greater than 50%  of this time was spent counseling and coordinating care related to the above assessment and plan.  Signed by: Darl Pikes, PharmD, BCPS, Salley Slaughter, CPP Hematology/Oncology Clinical Pharmacist Practitioner [facility] [phone]  03/06/2021 8:48 AM

## 2021-03-08 ENCOUNTER — Other Ambulatory Visit (HOSPITAL_COMMUNITY): Payer: Self-pay

## 2021-03-08 DIAGNOSIS — G4733 Obstructive sleep apnea (adult) (pediatric): Secondary | ICD-10-CM | POA: Diagnosis not present

## 2021-03-08 DIAGNOSIS — I872 Venous insufficiency (chronic) (peripheral): Secondary | ICD-10-CM | POA: Diagnosis not present

## 2021-03-09 ENCOUNTER — Other Ambulatory Visit (HOSPITAL_COMMUNITY): Payer: Self-pay

## 2021-03-09 LAB — URINE CULTURE: Culture: >=100000 — AB

## 2021-03-12 ENCOUNTER — Other Ambulatory Visit (HOSPITAL_COMMUNITY): Payer: Self-pay

## 2021-03-26 DIAGNOSIS — G4730 Sleep apnea, unspecified: Secondary | ICD-10-CM | POA: Diagnosis not present

## 2021-03-26 DIAGNOSIS — I872 Venous insufficiency (chronic) (peripheral): Secondary | ICD-10-CM | POA: Diagnosis not present

## 2021-03-26 DIAGNOSIS — J42 Unspecified chronic bronchitis: Secondary | ICD-10-CM | POA: Diagnosis not present

## 2021-03-26 DIAGNOSIS — I890 Lymphedema, not elsewhere classified: Secondary | ICD-10-CM | POA: Diagnosis not present

## 2021-03-26 DIAGNOSIS — I4892 Unspecified atrial flutter: Secondary | ICD-10-CM | POA: Diagnosis not present

## 2021-03-26 DIAGNOSIS — I739 Peripheral vascular disease, unspecified: Secondary | ICD-10-CM | POA: Diagnosis not present

## 2021-03-26 DIAGNOSIS — I482 Chronic atrial fibrillation, unspecified: Secondary | ICD-10-CM | POA: Diagnosis not present

## 2021-03-26 DIAGNOSIS — I359 Nonrheumatic aortic valve disorder, unspecified: Secondary | ICD-10-CM | POA: Diagnosis not present

## 2021-03-29 NOTE — Progress Notes (Signed)
[place]  [phone] [phone]) [phone]  ID: Earl Ramirez OB: 03/02/1941  MR#: [id number]  QMG#:[id number]  Patient Care Team: Baxter Hire, MD as PCP - General (Internal Medicine) Murrell Redden, MD (Urology) [name] Javier Docker, MD (Cardiology) Lloyd Huger, MD as Consulting Physician (Oncology)  CHIEF COMPLAINT: Stage IVB prostate cancer.  INTERVAL HISTORY: Patient returns to clinic today for repeat laboratory work and continuation of treatment.  He is tolerating Delton See and Xtandi well without significant side effects.  He currently feels well and is asymptomatic.  He does not complain of pain today.  He has no neurologic complaints.  He denies any recent fevers or illnesses.  He has a good appetite and denies weight loss. He denies any pain.  He has no chest pain, shortness of breath, cough, or hemoptysis.  He denies any nausea, vomiting, constipation, or diarrhea.  He continues to have urinary frequency.  Patient otherwise feels well and offers no further specific complaints today.  REVIEW OF SYSTEMS:   Review of Systems  Constitutional: Negative.  Negative for fever, malaise/fatigue and weight loss.  Respiratory: Negative.  Negative for cough, hemoptysis and shortness of breath.   Cardiovascular: Negative.  Negative for chest pain and leg swelling.  Gastrointestinal: Negative.  Negative for abdominal pain and constipation.  Genitourinary:  Positive for frequency.  Musculoskeletal: Negative.  Negative for back pain and neck pain.  Skin: Negative.  Negative for rash.  Neurological: Negative.  Negative for dizziness, focal weakness, weakness and headaches.  Psychiatric/Behavioral: Negative.  The patient is not nervous/anxious.    As per HPI. Otherwise, a complete review of systems is negative.  PAST MEDICAL HISTORY: Past Medical History:  Diagnosis Date   Aortic valve replaced    1998   Arthritis    Atrial fibrillation (HCC)    COPD  (chronic obstructive pulmonary disease) (HCC)    Dysrhythmia    atrial fib   Hernia of abdominal wall    Hypertension    Indigestion    Neuropathy    Neuropathy    Prostate cancer ([place])    Sleep apnea    Thrombocytopenia (HCC)    Tremors of nervous system    Urinary problem     PAST SURGICAL HISTORY: Past Surgical History:  Procedure Laterality Date   CALDWELL LUC     tongue      growth removed   TRANSURETHRAL RESECTION OF PROSTATE N/A 01/18/2019   Procedure: CHANNEL TRANSURETHRAL RESECTION OF THE PROSTATE (TURP);  Surgeon: [name], MD;  Location: [facility] ORS;  Service: Urology;  Laterality: N/A;   VALVE REPLACEMENT      FAMILY HISTORY: Family History  Problem Relation Age of Onset   Heart attack Father    Alcohol abuse Father    Congestive Heart Failure Mother    Alzheimer's disease Sister     ADVANCED DIRECTIVES (Y/N):  N  HEALTH MAINTENANCE: Social History   Tobacco Use   Smoking status: Former    Types: Cigarettes    Quit date: 01/13/2001    Years since quitting: 20.2   Smokeless tobacco: Former    Quit date: 09/24/2000  Vaping Use   Vaping Use: Never used  Substance Use Topics   Alcohol use: Yes    Alcohol/week: 10.0 standard drinks    Types: 10 Cans of beer per week   Drug use: No     Colonoscopy:  PAP:  Bone density:  Lipid panel:  Allergies  Allergen Reactions  Oxycodone-Acetaminophen Nausea Only and Shortness Of Breath   Oxycodone-Acetaminophen     Other reaction(s): Drowsy   Amoxicillin Itching    Hand itching   Penicillins Rash    Did it involve swelling of the face/tongue/throat, SOB, or low BP? No Did it involve sudden or severe rash/hives, skin peeling, or any reaction on the inside of your mouth or nose? No Did you need to seek medical attention at a hospital or doctor's office? No When did it last happen?      4-5 years ago If all above answers are "NO", may proceed with cephalosporin use.     Current Outpatient  Medications  Medication Sig Dispense Refill   ALPRAZolam (XANAX) 0.25 MG tablet Take 0.25 mg by mouth daily as needed for anxiety.      aspirin EC 81 MG tablet Take 81 mg by mouth daily.      calcium carbonate (TUMS - DOSED IN MG ELEMENTAL CALCIUM) 500 MG chewable tablet Chew 1 tablet by mouth daily.      enzalutamide (XTANDI) 40 MG tablet Take 4 tablets (160 mg total) by mouth daily. 120 tablet 2   gabapentin (NEURONTIN) 300 MG capsule Take 900 mg by mouth 4 (four) times daily.      metoprolol succinate (TOPROL-XL) 25 MG 24 hr tablet Take 25 mg by mouth daily.      sulfamethoxazole-trimethoprim (BACTRIM DS) 800-160 MG tablet Take 1 tablet by mouth every 12 (twelve) hours. 14 tablet 0   tamsulosin (FLOMAX) 0.4 MG CAPS capsule Take 1 capsule (0.4 mg total) by mouth 2 (two) times daily. 90 capsule 1   docusate sodium (COLACE) 100 MG capsule Take 1 capsule (100 mg total) by mouth 2 (two) times daily. (Patient not taking: Reported on 04/03/2021) 60 capsule 0   furosemide (LASIX) 20 MG tablet Take 20 mg by mouth daily. (Patient not taking: Reported on 04/03/2021)     No current facility-administered medications for this visit.    OBJECTIVE: Vitals:   04/03/21 1344  BP: 123/61  Pulse: 87  Resp: 18  Temp: (!) 96.9 F (36.1 C)  SpO2: 94%     Body mass index is 37.78 kg/m.    ECOG FS:1 - Symptomatic but completely ambulatory  General: Well-developed, well-nourished, no acute distress.  Sitting in wheelchair. Eyes: Pink conjunctiva, anicteric sclera. HEENT: Normocephalic, moist mucous membranes. Lungs: No audible wheezing or coughing. Heart: Regular rate and rhythm. Abdomen: Soft, nontender, no obvious distention. Musculoskeletal: No edema, cyanosis, or clubbing. Neuro: Alert, answering all questions appropriately. Cranial nerves grossly intact. Skin: No rashes or petechiae noted. Psych: Normal affect.   LAB RESULTS:  Lab Results  Component Value Date   NA 141 04/03/2021   K 4.4  04/03/2021   CL 108 04/03/2021   CO2 26 04/03/2021   GLUCOSE 95 04/03/2021   BUN 15 04/03/2021   CREATININE 0.58 (L) 04/03/2021   CALCIUM 9.1 04/03/2021   PROT 5.8 (L) 03/07/2020   ALBUMIN 3.7 03/07/2020   AST 38 03/07/2020   ALT 30 03/07/2020   ALKPHOS 47 03/07/2020   BILITOT 1.9 (H) 03/07/2020   GFRNONAA >60 04/03/2021   GFRAA >60 04/21/2020    Lab Results  Component Value Date   WBC 1.9 (L) 04/03/2021   NEUTROABS 1.3 (L) 04/03/2021   HGB 12.9 (L) 04/03/2021   HCT 38.4 (L) 04/03/2021   MCV 101.3 (H) 04/03/2021   PLT 38 (L) 04/03/2021     STUDIES: No results found.  ASSESSMENT: Stage IVB prostate  cancer  PLAN:    1.  Stage IVB prostate cancer: Patient initially received XRT for local control disease in 2016. His PSA initially trended down to 5.37, but then trended up to 14.77. Pylarify PET scan completed on January 23, 2021 reviewed independently with metastatic disease and bilateral common iliac nodes, retroperitoneal nodes, and multiple mediastinal lymph nodes.  Patient also noted to have FDG-avid 6 or 7 skeletal metastasis.  Patient initiated Gillermina Phy in approximately July 2022.  His PSA continues to trend down and is now 0.22.  Continue current dose until intolerable side effects or progression of disease.  He last received Eligard on October 09, 2020.  Proceed with Xgeva today.  Return to clinic in 4 weeks for further evaluation and continuation of treatment.  2.  Pancytopenia: Chronic and unchanged.  Continue to monitor closely.  Can consider bone marrow biopsy in the future if necessary. 3.  Hypocalcemia: Resolved.  Proceed with Xgeva as above.  Continue oral calcium supplementation. 4.  Urinary frequency: Previously, UA and culture did not reveal significant infection.  Patient was given a referral back to urology. 5.  Elevated bilirubin: Unclear etiology,.  Patient expressed understanding and was in agreement with this plan. He also understands that He can call clinic at  any time with any questions, concerns, or complaints.   Cancer Staging Prostate cancer [facility] Staging form: Prostate, AJCC 8th Edition - Clinical: Stage IVB (cTX, cN0, pM1b, PSA: 7.6) - Signed by Lloyd Huger, MD on 10/14/2019 Prostate specific antigen (PSA) range: Less than 10   Lloyd Huger, MD   04/03/2021 10:26 PM

## 2021-04-03 ENCOUNTER — Inpatient Hospital Stay: Payer: PPO

## 2021-04-03 ENCOUNTER — Other Ambulatory Visit (HOSPITAL_COMMUNITY): Payer: Self-pay

## 2021-04-03 ENCOUNTER — Telehealth: Payer: Self-pay

## 2021-04-03 ENCOUNTER — Inpatient Hospital Stay (HOSPITAL_BASED_OUTPATIENT_CLINIC_OR_DEPARTMENT_OTHER): Payer: PPO | Admitting: Oncology

## 2021-04-03 ENCOUNTER — Inpatient Hospital Stay: Payer: PPO | Admitting: Pharmacist

## 2021-04-03 VITALS — BP 123/61 | HR 87 | Temp 96.9°F | Resp 18 | Wt 241.2 lb

## 2021-04-03 DIAGNOSIS — C61 Malignant neoplasm of prostate: Secondary | ICD-10-CM | POA: Diagnosis not present

## 2021-04-03 LAB — BASIC METABOLIC PANEL
Anion gap: 7 (ref 5–15)
BUN: 15 mg/dL (ref 8–23)
CO2: 26 mmol/L (ref 22–32)
Calcium: 9.1 mg/dL (ref 8.9–10.3)
Chloride: 108 mmol/L (ref 98–111)
Creatinine, Ser: 0.58 mg/dL — ABNORMAL LOW (ref 0.61–1.24)
GFR, Estimated: >60 mL/min (ref >60)
Glucose, Bld: 95 mg/dL (ref 70–99)
Potassium: 4.4 mmol/L (ref 3.5–5.1)
Sodium: 141 mmol/L (ref 135–145)

## 2021-04-03 LAB — CBC WITH DIFFERENTIAL/PLATELET
Abs Immature Granulocytes: 0.01 10*3/uL (ref 0.00–0.07)
Basophils Absolute: 0 10*3/uL (ref 0.0–0.1)
Basophils Relative: 1 %
Eosinophils Absolute: 0 10*3/uL (ref 0.0–0.5)
Eosinophils Relative: 2 %
HCT: 38.4 % — ABNORMAL LOW (ref 39.0–52.0)
Hemoglobin: 12.9 g/dL — ABNORMAL LOW (ref 13.0–17.0)
Immature Granulocytes: 1 %
Lymphocytes Relative: 19 %
Lymphs Abs: 0.4 10*3/uL — ABNORMAL LOW (ref 0.7–4.0)
MCH: 34 pg (ref 26.0–34.0)
MCHC: 33.6 g/dL (ref 30.0–36.0)
MCV: 101.3 fL — ABNORMAL HIGH (ref 80.0–100.0)
Monocytes Absolute: 0.2 10*3/uL (ref 0.1–1.0)
Monocytes Relative: 9 %
Neutro Abs: 1.3 10*3/uL — ABNORMAL LOW (ref 1.7–7.7)
Neutrophils Relative %: 68 %
Platelets: 38 10*3/uL — ABNORMAL LOW (ref 150–400)
RBC: 3.79 MIL/uL — ABNORMAL LOW (ref 4.22–5.81)
RDW: 14.8 % (ref 11.5–15.5)
WBC: 1.9 10*3/uL — ABNORMAL LOW (ref 4.0–10.5)
nRBC: 0 % (ref 0.0–0.2)

## 2021-04-03 LAB — PSA: Prostatic Specific Antigen: 0.22 ng/mL (ref 0.00–4.00)

## 2021-04-03 MED ORDER — DENOSUMAB 120 MG/1.7ML ~~LOC~~ SOLN
120.0000 mg | Freq: Once | SUBCUTANEOUS | Status: AC
  Administered 2021-04-03: 120 mg via SUBCUTANEOUS
  Filled 2021-04-03: qty 1.7

## 2021-04-03 NOTE — Progress Notes (Signed)
[place]  [phone] [phone]  Patient Care Team: Baxter Hire, MD as PCP - General (Internal Medicine) Murrell Redden, MD (Urology) [name] Javier Docker, MD (Cardiology) Lloyd Huger, MD as Consulting Physician (Oncology)   Name of the patient: Earl Ramirez  [id number]  1941/05/26   Date of visit: 04/03/21  HPI: Patient is a 80 y.o. male with metastatic castration resistant prostate cancer presenting for Xtandi (enzalutamide) follow-up at 4 weeks. This was added to his ADT therapy on 02/16/21.  Reason for Consult: Oral chemotherapy follow-up for Xtandi (enzalutamide) therapy.   PAST MEDICAL HISTORY: Past Medical History:  Diagnosis Date   Aortic valve replaced    1998   Arthritis    Atrial fibrillation (HCC)    COPD (chronic obstructive pulmonary disease) (HCC)    Dysrhythmia    atrial fib   Hernia of abdominal wall    Hypertension    Indigestion    Neuropathy    Neuropathy    Prostate cancer (HCC)    Sleep apnea    Thrombocytopenia (HCC)    Tremors of nervous system    Urinary problem     HEMATOLOGY/ONCOLOGY HISTORY:  Oncology History  Prostate cancer ([place])  01/18/2019 Initial Diagnosis   Prostate cancer ([place])   10/14/2019 Cancer Staging   Staging form: Prostate, AJCC 8th Edition - Clinical: Stage IVB (cTX, cN0, pM1b, PSA: 7.6) - Signed by Lloyd Huger, MD on 10/14/2019     ALLERGIES:  is allergic to oxycodone-acetaminophen, oxycodone-acetaminophen, amoxicillin, and penicillins.  MEDICATIONS:  Current Outpatient Medications  Medication Sig Dispense Refill   ALPRAZolam (XANAX) 0.25 MG tablet Take 0.25 mg by mouth daily as needed for anxiety.      aspirin EC 81 MG tablet Take 81 mg by mouth daily.      calcium carbonate (TUMS - DOSED IN MG ELEMENTAL CALCIUM) 500 MG chewable tablet Chew 1 tablet by mouth daily.      docusate sodium (COLACE) 100 MG capsule Take 1 capsule (100  mg total) by mouth 2 (two) times daily. (Patient not taking: Reported on 04/03/2021) 60 capsule 0   enzalutamide (XTANDI) 40 MG tablet Take 4 tablets (160 mg total) by mouth daily. 120 tablet 2   furosemide (LASIX) 20 MG tablet Take 20 mg by mouth daily. (Patient not taking: Reported on 04/03/2021)     gabapentin (NEURONTIN) 300 MG capsule Take 900 mg by mouth 4 (four) times daily.      metoprolol succinate (TOPROL-XL) 25 MG 24 hr tablet Take 25 mg by mouth daily.      sulfamethoxazole-trimethoprim (BACTRIM DS) 800-160 MG tablet Take 1 tablet by mouth every 12 (twelve) hours. 14 tablet 0   tamsulosin (FLOMAX) 0.4 MG CAPS capsule Take 1 capsule (0.4 mg total) by mouth 2 (two) times daily. 90 capsule 1   No current facility-administered medications for this visit.    VITAL SIGNS: There were no vitals taken for this visit. There were no vitals filed for this visit.  Estimated body mass index is 37.78 kg/m as calculated from the following:   Height as of 03/23/20: '5\' 7"'$  (1.702 m).   Weight as of an earlier encounter on 04/03/21: 109.4 kg (241 lb 3.2 oz).  LABS: CBC:    Component Value Date/Time   WBC 1.9 (L) 04/03/2021 1305   HGB 12.9 (L) 04/03/2021 1305   HGB 15.1 05/06/2013 1613   HCT 38.4 (L) 04/03/2021 1305   HCT 44.6  05/06/2013 1613   PLT 38 (L) 04/03/2021 1305   PLT 88 (L) 05/06/2013 1613   MCV 101.3 (H) 04/03/2021 1305   MCV 100 05/06/2013 1613   NEUTROABS 1.3 (L) 04/03/2021 1305   NEUTROABS 2.3 05/06/2013 1613   LYMPHSABS 0.4 (L) 04/03/2021 1305   LYMPHSABS 1.1 05/06/2013 1613   MONOABS 0.2 04/03/2021 1305   MONOABS 0.3 05/06/2013 1613   EOSABS 0.0 04/03/2021 1305   EOSABS 0.1 05/06/2013 1613   BASOSABS 0.0 04/03/2021 1305   BASOSABS 0.0 05/06/2013 1613   Comprehensive Metabolic Panel:    Component Value Date/Time   NA 141 04/03/2021 1305   K 4.4 04/03/2021 1305   CL 108 04/03/2021 1305   CO2 26 04/03/2021 1305   BUN 15 04/03/2021 1305   CREATININE 0.58 (L)  04/03/2021 1305   GLUCOSE 95 04/03/2021 1305   CALCIUM 9.1 04/03/2021 1305   AST 38 03/07/2020 1449   ALT 30 03/07/2020 1449   ALKPHOS 47 03/07/2020 1449   BILITOT 1.9 (H) 03/07/2020 1449   PROT 5.8 (L) 03/07/2020 1449   ALBUMIN 3.7 03/07/2020 1449     Present during today's visit: Patient only  Assessment and Plan: Continue Enzalutamide '160mg'$  daily   Oral Chemotherapy Side Effect/Intolerance:  HTN: BP was previously elevated and has improved since last visit. Today's reading was 123/61, which is consistent with BP reading from 8/22 cardiology visit. Continue to monitor BP. Bowel movement changes: Patient noted fluctuations between constipation and diarrhea. He has docusate on hand and reported not using any. He endorsed occasional loose stools and has access to Imodium at home. Advised patient to call the cancer center if diarrhea worsens (4 or more loose stools) with Imodium use. Headache: Noted one episode of headache for about 30 minutes since enzalutamide initiation. No new headache or dizziness. Decreased appetite: Reported a decreased in appetite only eating one meal a day and a few snacks. Encouraged him to maintain calorie and protein intake as much as possible. Reviewed weights in Epic and it looks like he is adequately maintaining weight.  Fatigue: Reported neuropathy and increased tiredness that interferes with his daily activity. Encouraged patient to start with small, achievable goals by standing up once in a while. Continue to monitor activity level.  Other: UTI: Completed 7 days of Bactrim in early August and experienced worsening of urinary symptoms (leakage, dysuria) after initial improvement. A referral back to urology was sent today for further evaluation and symptom management. He was seen by urology for a similar issue about a year ago (04/18/20) and they mentioned potentially doing a cystoscopy. Patient was concerned about increased floaters in vision. This is unlikely  a side effect related to enzalutamide. Will look into this and follow up with him by phone to provide more information.  Patient inquired about dementia symptoms. Encouraged him to continue engaging in mentally stimulating activities (e.g. puzzle solving). A handout with information and resources from the Alzheimer's [employer] will be mailed to patient.  Oral Chemotherapy Adherence: No missed doses reported.  No patient barriers to medication adherence identified.   New medications: None reported  Medication Access Issues: No issues, fills at [facility]. Refill is scheduled for delivery on 04/06/21.  Patient expressed understanding and was in agreement with this plan. He also understands that He can call clinic at any time with any questions, concerns, or complaints.   Follow-up plan: Follow up on 05/03/21  Thank you for allowing me to participate in the care of this very  pleasant patient.   Time Total: 30 minutes  Visit consisted of counseling and education on dealing with issues of symptom management in the setting of serious and potentially life-threatening illness.Greater than 50%  of this time was spent counseling and coordinating care related to the above assessment and plan.  Signed by: Darl Pikes, PharmD, BCPS, Salley Slaughter, CPP Hematology/Oncology Clinical Pharmacist Practitioner [facility] [phone]  04/03/2021 3:09 PM

## 2021-04-03 NOTE — Telephone Encounter (Signed)
Patient has been scheduled to see Dr. Erlene Quan on 04/17/21 at 2:30.  Patient notified and he will contact that office if any changes need to be made.

## 2021-04-03 NOTE — Progress Notes (Signed)
Pt concerned about changes in vision("floaters but different") since starting Xtandi.

## 2021-04-04 ENCOUNTER — Telehealth: Payer: Self-pay

## 2021-04-04 NOTE — Telephone Encounter (Signed)
Oral Chemotherapy Student Pharmacist Encounter   Mr. Earl Ramirez reported having increased eye floaters during yesterday's appointment and would like to know whether this is related to enzalutamide. I followed up with Mr. Matusik and let him know that this is unlikely a side effect of the medication. I recommended him to continue monitoring changes in vision and consider pushing up his eye doctor appointment in November if symptoms worsen.   Also let him know that a letter with requested information on dementia symptoms from the Alzheimer's [employer] was mailed to him yesterday.   Wynelle Cleveland, PharmD Candidate [facility] [phone]  04/04/2021 3:14 PM

## 2021-04-05 ENCOUNTER — Other Ambulatory Visit (HOSPITAL_COMMUNITY): Payer: Self-pay

## 2021-04-08 DIAGNOSIS — I872 Venous insufficiency (chronic) (peripheral): Secondary | ICD-10-CM | POA: Diagnosis not present

## 2021-04-08 DIAGNOSIS — G4733 Obstructive sleep apnea (adult) (pediatric): Secondary | ICD-10-CM | POA: Diagnosis not present

## 2021-04-15 ENCOUNTER — Other Ambulatory Visit: Payer: Self-pay | Admitting: Urology

## 2021-04-15 DIAGNOSIS — N401 Enlarged prostate with lower urinary tract symptoms: Secondary | ICD-10-CM

## 2021-04-16 NOTE — Progress Notes (Signed)
04/17/21 3:20 PM   Earl Ramirez 14, 1942 [id number]  Referring provider:  Baxter Hire, MD [name],  [name] [address] Chief Complaint  Patient presents with   Dysuria     HPI: Earl Ramirez is a 80 y.o.male with a personal of metastatic prostate cancer managed by Dr. [name] , who presents today for a  1 year follow-up on dysuria and frequency.   He was referred back to discuss urinary symptoms specifically.   S/p urethral dilation and TURP in 2020.   He had a PET scan on 01/23/2021 for prostate carcinoma with biochemical recurrence  that revealed evidence of extensive radiotracer avid metastatic adenopathy involving the common iliac nodes, left periaortic retroperitoneal nodes and multiple mediastinal nodes. Adenopathy extends into the tight supraclavicular nodal station and posterior right neck.Several nodes were very small but with intense radiotracer activity.  He had a UTI, staph on 03/06/2021 and was placed on bactrim. He believes he did start to improved but wen the abx ran out, his symptoms started to worsen.    He is being managed by Dr. [name] and is currently on [place] , and eligard   His most recent PSA on 04/03/2021 was 0.22.   Urine today shows 6-10 WBCs and nitrite positive.   He reports leakage today. He denies burning and discomfort. He reports getting up during the night to urinate. He wears depends due to leakage.   PSA trend  Component Prostatic Specific Antigen  Latest Ref Rng & Units 0.00 - 4.00 ng/mL  06/26/2018 1.97  10/22/2019 6.10 (H)  11/18/2019 5.60 (H)  12/20/2019 5.80 (H)  01/21/2020 5.37 (H)  03/07/2020 6.20 (H)  03/24/2020 6.38 (H)  04/21/2020 7.25 (H)  06/20/2020 7.02 (H)  10/09/2020 10.81 (H)  11/09/2020 12.45 (H)  12/11/2020 12.43 (H)  01/09/2021 14.77 (H)  03/06/2021 1.16  04/03/2021 0.22      PMH: Past Medical History:  Diagnosis Date   Aortic valve replaced    1998   Arthritis    Atrial  fibrillation (HCC)    COPD (chronic obstructive pulmonary disease) (HCC)    Dysrhythmia    atrial fib   Hernia of abdominal wall    Hypertension    Indigestion    Neuropathy    Neuropathy    Prostate cancer ([place])    Sleep apnea    Thrombocytopenia (HCC)    Tremors of nervous system    Urinary problem     Surgical History: Past Surgical History:  Procedure Laterality Date   CALDWELL LUC     tongue      growth removed   TRANSURETHRAL RESECTION OF PROSTATE N/A 01/18/2019   Procedure: CHANNEL TRANSURETHRAL RESECTION OF THE PROSTATE (TURP);  Surgeon: [name], MD;  Location: [facility] ORS;  Service: Urology;  Laterality: N/A;   VALVE REPLACEMENT      Home Medications:  Allergies as of 04/17/2021       Reactions   Oxycodone-acetaminophen Nausea Only, Shortness Of Breath   Oxycodone-acetaminophen    Other reaction(s): Drowsy   Amoxicillin Itching   Hand itching   Penicillins Rash   Did it involve swelling of the face/tongue/throat, SOB, or low BP? No Did it involve sudden or severe rash/hives, skin peeling, or any reaction on the inside of your mouth or nose? No Did you need to seek medical attention at a hospital or doctor's office? No When did it last happen?      4-5 years ago  If all above answers are "NO", may proceed with cephalosporin use.        Medication List        Accurate as of April 17, 2021  3:20 PM. If you have any questions, ask your nurse or doctor.          ALPRAZolam 0.25 MG tablet Commonly known as: XANAX Take 0.25 mg by mouth daily as needed for anxiety.   aspirin EC 81 MG tablet Take 81 mg by mouth daily.   calcium carbonate 500 MG chewable tablet Commonly known as: TUMS - dosed in mg elemental calcium Chew 1 tablet by mouth daily.   docusate sodium 100 MG capsule Commonly known as: COLACE Take 1 capsule (100 mg total) by mouth 2 (two) times daily.   furosemide 20 MG tablet Commonly known as: LASIX Take 20 mg by mouth  daily.   gabapentin 300 MG capsule Commonly known as: NEURONTIN Take 900 mg by mouth 4 (four) times daily.   metoprolol succinate 25 MG 24 hr tablet Commonly known as: TOPROL-XL Take 25 mg by mouth daily.   mirabegron ER 50 MG Tb24 tablet Commonly known as: MYRBETRIQ Take 1 tablet (50 mg total) by mouth daily. Started by: [name], MD   sulfamethoxazole-trimethoprim 800-160 MG tablet Commonly known as: BACTRIM DS Take 1 tablet by mouth 2 (two) times daily. What changed: when to take this Changed by: [name], MD   tamsulosin 0.4 MG Caps capsule Commonly known as: FLOMAX Take 1 capsule by mouth twice daily   Xtandi 40 MG tablet Generic drug: enzalutamide Take 4 tablets (160 mg total) by mouth daily.        Allergies:  Allergies  Allergen Reactions   Oxycodone-Acetaminophen Nausea Only and Shortness Of Breath   Oxycodone-Acetaminophen     Other reaction(s): Drowsy   Amoxicillin Itching    Hand itching   Penicillins Rash    Did it involve swelling of the face/tongue/throat, SOB, or low BP? No Did it involve sudden or severe rash/hives, skin peeling, or any reaction on the inside of your mouth or nose? No Did you need to seek medical attention at a hospital or doctor's office? No When did it last happen?      4-5 years ago If all above answers are "NO", may proceed with cephalosporin use.     Family History: Family History  Problem Relation Age of Onset   Heart attack Father    Alcohol abuse Father    Congestive Heart Failure Mother    Alzheimer's disease Sister     Social History:  reports that he quit smoking about 20 years ago. His smoking use included cigarettes. He quit smokeless tobacco use about 20 years ago. He reports current alcohol use of about 10.0 standard drinks per week. He reports that he does not use drugs.   Physical Exam: BP 130/74   Pulse 85   Ht '5\' 7"'$  (1.702 m)   Wt 240 lb (108.9 kg)   BMI 37.59 kg/m   Constitutional:   Alert and oriented, No acute distress. HEENT: [place] AT, moist mucus membranes.  Trachea midline, no masses. Cardiovascular: No clubbing, cyanosis, or edema. Respiratory: Normal respiratory effort, no increased work of breathing. Skin: No rashes, bruises or suspicious lesions. Neurologic: Grossly intact, no focal deficits, moving all 4 extremities. Psychiatric: Normal mood and affect.  Laboratory Data:  Lab Results  Component Value Date   CREATININE 0.58 (L) 04/03/2021    Lab Results  Component Value  Date   PSA 0.12 01/17/2016    Urinalysis 6-10 WBCs and nitrite positive  Pertinent Imaging: CLINICAL DATA:  Prostate carcinoma with biochemical recurrence.   EXAM: NUCLEAR MEDICINE PET SKULL BASE TO THIGH   TECHNIQUE: 9.45 mCi F18 Piflufolastat (Pylarify) was injected intravenously. Full-ring PET imaging was performed from the skull base to thigh after the radiotracer. CT data was obtained and used for attenuation correction and anatomic localization.   COMPARISON:  None.   FINDINGS: NECK   Intense radiotracer activity associated with a small RIGHT supraclavicular node measuring only 4 mm short axis (image 70) but with intense radiotracer activity (SUV 10.5). Similar intense activity associated with the posterior RIGHT posterior triangle neck lymph node measuring 3 mm (image 56) and SUV 3.3   Incidental CT finding: None   CHEST   Intense radiotracer activity associated RIGHT paratracheal and LEFT lower paratracheal nodes. Example RIGHT lower paratracheal lymph node measuring 9 mm short axis image 100) with SUV max 21.1. A similar RIGHT mid paratracheal node with SUV max 12.   Within the RIGHT middle lobe rounded nodule measuring 13 mm unchanged prior does not have significant radiotracer   Incidental CT finding: None   ABDOMEN/PELVIS   Prostate: No focal activity in prostate gland.   Lymph nodes: No radiotracer avid deep pelvic lymph nodes. Radiotracer  activity associated with a proximal LEFT common iliac node measuring 7 mm image 11 with SUV max equal 45.   Chain of similar intense radiotracer avid small periaortic lymph nodes LEFT of the aorta. For example 8 mm node level of the LEFT kidney SUV max equal 18.7.   Liver: No evidence of liver metastasis   Incidental CT finding: None   SKELETON   Multifocal intense radiotracer avid skeletal metastasis.   Lesion again demonstrated anterior aspect of the RIGHT acetabulum at site of prior suspicious lesion with SUV max equal 37.1.   Several additional rib lesions are present. For example anteromedial LEFT third rib with SUV max equal 20.5. Lesion in the mid sternum with SUV max equal 20.1. Lesion RIGHT fourth rib at the costovertebral junction (image 89) SUV max equal 21.   There are 6 or 7 discrete bony metastasis.   IMPRESSION: 1. Evidence of extensive radiotracer avid metastatic adenopathy involving the common iliac nodes, LEFT periaortic retroperitoneal nodes and multiple mediastinal nodes. Adenopathy extends into the RIGHT supraclavicular nodal station and posterior RIGHT neck. Several nodes are very small but with intense radiotracer activity. 2. Multifocal radiotracer avid skeletal metastasis with 6 or 7 discrete metastatic skeletal lesions     Electronically Signed   By: Suzy Bouchard M.D.   On: 01/24/2021 15:04  Results for orders placed or performed in visit on 04/17/21  BLADDER SCAN AMB NON-IMAGING  Result Value Ref Range   Scan Result 0 ml      Assessment & Plan:    Metastatic prostate cancer with bony lesions, presumably castrate sensitive  - Managed by Dr. [name]  - On [place], and eligard  - PSA trend as above, reassuring  2. Dysuria  - urine positive today has a history of recurrent UTI  -- UA today shows 6-10 WBCs and nitrite positive - treat with bactrim for more extensive treatment , repeat UCx today  - Placed on myrbetriq to help  with OAB symptoms.   3. History of outlet obstruction - S/p channel TURP and urethral dilation 2020  - Given history of recurrent infection, recommend repeat cystoscopy to rule out recurrent stricture.  He declined, will be more adamant if infections continue to occur.   3. Urinary urgency  -as above   Return in about 1 month (around 05/17/2021). For symptom recheck, IPSS, PVR, UA  I,Kailey Littlejohn,acting as a scribe for [name], MD.,have documented all relevant documentation on the behalf of [name], MD,as directed by  [name], MD while in the presence of [name], MD.  I have reviewed the above documentation for accuracy and completeness, and I agree with the above.   [name], MD   [facility] [address] [place], [place] [address] [phone]

## 2021-04-17 ENCOUNTER — Ambulatory Visit (INDEPENDENT_AMBULATORY_CARE_PROVIDER_SITE_OTHER): Payer: PPO | Admitting: Urology

## 2021-04-17 ENCOUNTER — Other Ambulatory Visit: Payer: Self-pay

## 2021-04-17 VITALS — BP 130/74 | HR 85 | Ht 67.0 in | Wt 240.0 lb

## 2021-04-17 DIAGNOSIS — R300 Dysuria: Secondary | ICD-10-CM | POA: Diagnosis not present

## 2021-04-17 DIAGNOSIS — C61 Malignant neoplasm of prostate: Secondary | ICD-10-CM

## 2021-04-17 DIAGNOSIS — N390 Urinary tract infection, site not specified: Secondary | ICD-10-CM | POA: Diagnosis not present

## 2021-04-17 DIAGNOSIS — R350 Frequency of micturition: Secondary | ICD-10-CM | POA: Diagnosis not present

## 2021-04-17 LAB — BLADDER SCAN AMB NON-IMAGING: Scan Result: 0

## 2021-04-17 MED ORDER — MIRABEGRON ER 50 MG PO TB24: 50.0000 mg | ORAL_TABLET | Freq: Every day | ORAL | 11 refills | Status: DC

## 2021-04-17 MED ORDER — SULFAMETHOXAZOLE-TRIMETHOPRIM 800-160 MG PO TABS: 1.0000 | ORAL_TABLET | Freq: Two times a day (BID) | ORAL | 0 refills | Status: DC

## 2021-04-18 LAB — URINALYSIS, COMPLETE
Bilirubin, UA: NEGATIVE
Glucose, UA: NEGATIVE
Ketones, UA: NEGATIVE
Nitrite, UA: POSITIVE — AB
Specific Gravity, UA: 1.025 (ref 1.005–1.030)
Urobilinogen, Ur: 1 mg/dL (ref 0.2–1.0)
pH, UA: 5.5 (ref 5.0–7.5)

## 2021-04-18 LAB — MICROSCOPIC EXAMINATION

## 2021-04-23 ENCOUNTER — Telehealth: Payer: Self-pay

## 2021-04-23 LAB — CULTURE, URINE COMPREHENSIVE

## 2021-04-23 NOTE — Telephone Encounter (Signed)
Patient called inquiring about Myrbetriq side effects. He states that he read that a possible side effect could be throat swelling/trouble swallowing. Patient is concerned with his history of obstructive sleep apnea that this may be a potential problem for him. He was reassured that this would be an adverse reaction of the medication rather than a side effect and would be unlikely. Most common known Myrbetriq side effect noted is elevated BP. It was expressed to Earl Ramirez that the Myrbetriq is to help with urinary urgency and he did not have to take the samples given if he was concerned with the possible reaction of swelling/trouble swallowing. Patient verbalized understanding states he will think about it but most likely will try the medication. He was instructed to call us if any reaction or side effects occur, if severe he should call EMS or report to ED.

## 2021-04-25 ENCOUNTER — Other Ambulatory Visit (HOSPITAL_COMMUNITY): Payer: Self-pay

## 2021-04-25 ENCOUNTER — Telehealth: Payer: Self-pay

## 2021-04-25 DIAGNOSIS — R300 Dysuria: Secondary | ICD-10-CM

## 2021-04-25 NOTE — Telephone Encounter (Signed)
Incoming call on triage line from [place] @ Dr Virgel Manifold office on behalf of the patient in regards to patients Myrbetriq samples. Patient states that he lost his samples and would like an Rx sent to the [facility].

## 2021-04-26 MED ORDER — MIRABEGRON ER 50 MG PO TB24: 50.0000 mg | ORAL_TABLET | Freq: Every day | ORAL | 11 refills | Status: DC

## 2021-04-26 NOTE — Telephone Encounter (Signed)
Ok to provide new samples and / or call in prescription  [name], MD

## 2021-04-26 NOTE — Telephone Encounter (Signed)
Incoming call from pt who states that he is awaiting response on Myrbetriq as he has lost samples. Pt states that he would prefer the RX be sent to his pharmacy as he has issues with transportation and they will deliver. RX sent.

## 2021-04-26 NOTE — Addendum Note (Signed)
Addended by: Donalee Citrin on: 04/26/2021 02:32 PM   Modules accepted: Orders

## 2021-04-28 NOTE — Progress Notes (Signed)
[place]  Telephone:([phone] [phone]) [phone]  ID: Earl Ramirez OB: 17-Sep-1940  MR#: [id number]  QDI#:[id number]  Patient Care Team: Baxter Hire, MD as PCP - General (Internal Medicine) Murrell Redden, MD (Urology) [name] Javier Docker, MD (Cardiology) Lloyd Huger, MD as Consulting Physician (Oncology)  CHIEF COMPLAINT: Stage IVB prostate cancer.  INTERVAL HISTORY: Patient returns to clinic today for repeat laboratory work, further evaluation, and continuation of [place], [place], and [place].  He continues to have weakness and fatigue, but otherwise is tolerating his treatments well without significant side effects.  He does not complain of pain today.  He has no neurologic complaints.  He denies any recent fevers or illnesses.  He has a good appetite and denies weight loss. He has no chest pain, shortness of breath, cough, or hemoptysis.  He denies any nausea, vomiting, constipation, or diarrhea.  He continues to have urinary frequency.  Patient offers no further specific complaints today.  REVIEW OF SYSTEMS:   Review of Systems  Constitutional:  Positive for malaise/fatigue. Negative for fever and weight loss.  Respiratory: Negative.  Negative for cough, hemoptysis and shortness of breath.   Cardiovascular: Negative.  Negative for chest pain and leg swelling.  Gastrointestinal: Negative.  Negative for abdominal pain and constipation.  Genitourinary:  Positive for frequency.  Musculoskeletal: Negative.  Negative for back pain and neck pain.  Skin: Negative.  Negative for rash.  Neurological:  Positive for weakness. Negative for dizziness, focal weakness and headaches.  Psychiatric/Behavioral: Negative.  The patient is not nervous/anxious.    As per HPI. Otherwise, a complete review of systems is negative.  PAST MEDICAL HISTORY: Past Medical History:  Diagnosis Date   Aortic valve replaced    1998   Arthritis    Atrial fibrillation (HCC)    COPD  (chronic obstructive pulmonary disease) (HCC)    Dysrhythmia    atrial fib   Hernia of abdominal wall    Hypertension    Indigestion    Neuropathy    Neuropathy    Prostate cancer ([place])    Sleep apnea    Thrombocytopenia (HCC)    Tremors of nervous system    Urinary problem     PAST SURGICAL HISTORY: Past Surgical History:  Procedure Laterality Date   CALDWELL LUC     tongue      growth removed   TRANSURETHRAL RESECTION OF PROSTATE N/A 01/18/2019   Procedure: CHANNEL TRANSURETHRAL RESECTION OF THE PROSTATE (TURP);  Surgeon: [name], MD;  Location: [facility] ORS;  Service: Urology;  Laterality: N/A;   VALVE REPLACEMENT      FAMILY HISTORY: Family History  Problem Relation Age of Onset   Heart attack Father    Alcohol abuse Father    Congestive Heart Failure Mother    Alzheimer's disease Sister     ADVANCED DIRECTIVES (Y/N):  N  HEALTH MAINTENANCE: Social History   Tobacco Use   Smoking status: Former    Types: Cigarettes    Quit date: 01/13/2001    Years since quitting: 20.3   Smokeless tobacco: Former    Quit date: 09/24/2000  Vaping Use   Vaping Use: Never used  Substance Use Topics   Alcohol use: Yes    Alcohol/week: 10.0 standard drinks    Types: 10 Cans of beer per week   Drug use: No     Colonoscopy:  PAP:  Bone density:  Lipid panel:  Allergies  Allergen Reactions   Oxycodone-Acetaminophen Nausea Only  and Shortness Of Breath   Oxycodone-Acetaminophen     Other reaction(s): Drowsy   Amoxicillin Itching    Hand itching   Penicillins Rash    Did it involve swelling of the face/tongue/throat, SOB, or low BP? No Did it involve sudden or severe rash/hives, skin peeling, or any reaction on the inside of your mouth or nose? No Did you need to seek medical attention at a hospital or doctor's office? No When did it last happen?      4-5 years ago If all above answers are "NO", may proceed with cephalosporin use.     Current Outpatient  Medications  Medication Sig Dispense Refill   ALPRAZolam (XANAX) 0.25 MG tablet Take 0.25 mg by mouth daily as needed for anxiety.      aspirin EC 81 MG tablet Take 81 mg by mouth daily.      calcium carbonate (TUMS - DOSED IN MG ELEMENTAL CALCIUM) 500 MG chewable tablet Chew 1 tablet by mouth daily.      docusate sodium (COLACE) 100 MG capsule Take 1 capsule (100 mg total) by mouth 2 (two) times daily. 60 capsule 0   enzalutamide (XTANDI) 40 MG tablet Take 4 tablets (160 mg total) by mouth daily. 120 tablet 2   furosemide (LASIX) 20 MG tablet Take 20 mg by mouth daily.     gabapentin (NEURONTIN) 300 MG capsule Take 900 mg by mouth 4 (four) times daily.      metoprolol succinate (TOPROL-XL) 25 MG 24 hr tablet Take 25 mg by mouth daily.      mirabegron ER (MYRBETRIQ) 50 MG TB24 tablet Take 1 tablet (50 mg total) by mouth daily. 30 tablet 11   sulfamethoxazole-trimethoprim (BACTRIM DS) 800-160 MG tablet Take 1 tablet by mouth 2 (two) times daily. 14 tablet 0   tamsulosin (FLOMAX) 0.4 MG CAPS capsule Take 1 capsule by mouth twice daily 180 capsule 0   No current facility-administered medications for this visit.    OBJECTIVE: Vitals:   05/03/21 1338 05/03/21 1400  BP: (!) 98/49 (!) 96/46  Pulse: 81   Resp: 18   Temp: 98.2 F (36.8 C)   SpO2: 98%      Body mass index is 37.79 kg/m.    ECOG FS:1 - Symptomatic but completely ambulatory  General: Well-developed, well-nourished, no acute distress. Eyes: Pink conjunctiva, anicteric sclera. HEENT: Normocephalic, moist mucous membranes. Lungs: No audible wheezing or coughing. Heart: Regular rate and rhythm. Abdomen: Soft, nontender, no obvious distention. Musculoskeletal: No edema, cyanosis, or clubbing. Neuro: Alert, answering all questions appropriately. Cranial nerves grossly intact. Skin: No rashes or petechiae noted. Psych: Normal affect.   LAB RESULTS:  Lab Results  Component Value Date   NA 138 05/03/2021   K 4.2 05/03/2021    CL 105 05/03/2021   CO2 26 05/03/2021   GLUCOSE 105 (H) 05/03/2021   BUN 19 05/03/2021   CREATININE 0.72 05/03/2021   CALCIUM 8.8 (L) 05/03/2021   PROT 5.8 (L) 03/07/2020   ALBUMIN 3.7 03/07/2020   AST 38 03/07/2020   ALT 30 03/07/2020   ALKPHOS 47 03/07/2020   BILITOT 1.9 (H) 03/07/2020   GFRNONAA >60 05/03/2021   GFRAA >60 04/21/2020    Lab Results  Component Value Date   WBC 2.0 (L) 05/03/2021   NEUTROABS 1.4 (L) 05/03/2021   HGB 13.2 05/03/2021   HCT 39.1 05/03/2021   MCV 102.6 (H) 05/03/2021   PLT 35 (L) 05/03/2021     STUDIES: No results found.  ASSESSMENT: Stage IVB prostate cancer  PLAN:    1.  Stage IVB prostate cancer: Patient initially received XRT for local control disease in 2016. His PSA initially trended down to 5.37, but then trended up to 14.77. Pylarify PET scan completed on January 23, 2021 reviewed independently with metastatic disease and bilateral common iliac nodes, retroperitoneal nodes, and multiple mediastinal lymph nodes.  Patient also noted to have FDG-avid 6 or 7 skeletal metastasis.  Patient initiated Gillermina Phy in approximately July 2022.  His PSA continues to trend and is now 0.07. Continue current dose until intolerable side effects or progression of disease.  Proceed with Eligard and Xtandi today.  Return to clinic in 4 weeks for further evaluation and continuation of treatment.  Appreciate clinical pharmacy input. 2.  Pancytopenia: Chronic and unchanged.  Continue to monitor closely.  Can consider bone marrow biopsy in the future if necessary. 3.  Hypocalcemia: Resolved.  Proceed with Xgeva as above.  Continue oral calcium supplementation. 4.  Urinary frequency: Chronic and unchanged.  States previously, UA and culture did not reveal significant infection.  Patient was previously given a referral back to urology. 5.  Elevated bilirubin: Unclear etiology,.  Chronic and unchanged.  Patient expressed understanding and was in agreement with this  plan. He also understands that He can call clinic at any time with any questions, concerns, or complaints.   Cancer Staging Prostate cancer [facility]) Staging form: Prostate, AJCC 8th Edition - Clinical: Stage IVB (cTX, cN0, pM1b, PSA: 7.6) - Signed by Lloyd Huger, MD on 10/14/2019 Prostate specific antigen (PSA) range: Less than 10   Lloyd Huger, MD   05/06/2021 7:15 AM

## 2021-04-30 ENCOUNTER — Other Ambulatory Visit (HOSPITAL_COMMUNITY): Payer: Self-pay

## 2021-04-30 ENCOUNTER — Other Ambulatory Visit: Payer: Self-pay | Admitting: Oncology

## 2021-04-30 DIAGNOSIS — C61 Malignant neoplasm of prostate: Secondary | ICD-10-CM

## 2021-04-30 MED ORDER — ENZALUTAMIDE 40 MG PO TABS
160.0000 mg | ORAL_TABLET | Freq: Every day | ORAL | 2 refills | Status: DC
  Filled 2021-05-07: qty 120, 30d supply, fill #0
  Filled 2021-05-30: qty 120, 30d supply, fill #1
  Filled 2021-07-16: qty 120, 30d supply, fill #2

## 2021-05-02 ENCOUNTER — Other Ambulatory Visit (HOSPITAL_COMMUNITY): Payer: Self-pay

## 2021-05-03 ENCOUNTER — Inpatient Hospital Stay: Payer: PPO

## 2021-05-03 ENCOUNTER — Inpatient Hospital Stay: Payer: PPO | Attending: Oncology

## 2021-05-03 ENCOUNTER — Inpatient Hospital Stay: Payer: PPO | Admitting: Pharmacist

## 2021-05-03 ENCOUNTER — Inpatient Hospital Stay (HOSPITAL_BASED_OUTPATIENT_CLINIC_OR_DEPARTMENT_OTHER): Payer: PPO | Admitting: Oncology

## 2021-05-03 VITALS — BP 96/46 | HR 81 | Temp 98.2°F | Resp 18 | Wt 241.3 lb

## 2021-05-03 DIAGNOSIS — R350 Frequency of micturition: Secondary | ICD-10-CM | POA: Diagnosis not present

## 2021-05-03 DIAGNOSIS — I4891 Unspecified atrial fibrillation: Secondary | ICD-10-CM | POA: Diagnosis not present

## 2021-05-03 DIAGNOSIS — Z79899 Other long term (current) drug therapy: Secondary | ICD-10-CM | POA: Insufficient documentation

## 2021-05-03 DIAGNOSIS — Z87891 Personal history of nicotine dependence: Secondary | ICD-10-CM | POA: Diagnosis not present

## 2021-05-03 DIAGNOSIS — C61 Malignant neoplasm of prostate: Secondary | ICD-10-CM

## 2021-05-03 DIAGNOSIS — G629 Polyneuropathy, unspecified: Secondary | ICD-10-CM | POA: Diagnosis not present

## 2021-05-03 DIAGNOSIS — Z79818 Long term (current) use of other agents affecting estrogen receptors and estrogen levels: Secondary | ICD-10-CM | POA: Insufficient documentation

## 2021-05-03 DIAGNOSIS — D696 Thrombocytopenia, unspecified: Secondary | ICD-10-CM | POA: Insufficient documentation

## 2021-05-03 DIAGNOSIS — D61818 Other pancytopenia: Secondary | ICD-10-CM | POA: Insufficient documentation

## 2021-05-03 DIAGNOSIS — I10 Essential (primary) hypertension: Secondary | ICD-10-CM | POA: Insufficient documentation

## 2021-05-03 DIAGNOSIS — C7951 Secondary malignant neoplasm of bone: Secondary | ICD-10-CM | POA: Diagnosis not present

## 2021-05-03 DIAGNOSIS — Z7982 Long term (current) use of aspirin: Secondary | ICD-10-CM | POA: Insufficient documentation

## 2021-05-03 DIAGNOSIS — G4730 Sleep apnea, unspecified: Secondary | ICD-10-CM | POA: Diagnosis not present

## 2021-05-03 DIAGNOSIS — J449 Chronic obstructive pulmonary disease, unspecified: Secondary | ICD-10-CM | POA: Diagnosis not present

## 2021-05-03 LAB — CBC WITH DIFFERENTIAL/PLATELET
Abs Immature Granulocytes: 0 10*3/uL (ref 0.00–0.07)
Basophils Absolute: 0 10*3/uL (ref 0.0–0.1)
Basophils Relative: 1 %
Eosinophils Absolute: 0.1 10*3/uL (ref 0.0–0.5)
Eosinophils Relative: 3 %
HCT: 39.1 % (ref 39.0–52.0)
Hemoglobin: 13.2 g/dL (ref 13.0–17.0)
Immature Granulocytes: 0 %
Lymphocytes Relative: 19 %
Lymphs Abs: 0.4 10*3/uL — ABNORMAL LOW (ref 0.7–4.0)
MCH: 34.6 pg — ABNORMAL HIGH (ref 26.0–34.0)
MCHC: 33.8 g/dL (ref 30.0–36.0)
MCV: 102.6 fL — ABNORMAL HIGH (ref 80.0–100.0)
Monocytes Absolute: 0.2 10*3/uL (ref 0.1–1.0)
Monocytes Relative: 10 %
Neutro Abs: 1.4 10*3/uL — ABNORMAL LOW (ref 1.7–7.7)
Neutrophils Relative %: 67 %
Platelets: 35 10*3/uL — ABNORMAL LOW (ref 150–400)
RBC: 3.81 MIL/uL — ABNORMAL LOW (ref 4.22–5.81)
RDW: 15.3 % (ref 11.5–15.5)
WBC: 2 10*3/uL — ABNORMAL LOW (ref 4.0–10.5)
nRBC: 0 % (ref 0.0–0.2)

## 2021-05-03 LAB — BASIC METABOLIC PANEL
Anion gap: 7 (ref 5–15)
BUN: 19 mg/dL (ref 8–23)
CO2: 26 mmol/L (ref 22–32)
Calcium: 8.8 mg/dL — ABNORMAL LOW (ref 8.9–10.3)
Chloride: 105 mmol/L (ref 98–111)
Creatinine, Ser: 0.72 mg/dL (ref 0.61–1.24)
GFR, Estimated: >60 mL/min (ref >60)
Glucose, Bld: 105 mg/dL — ABNORMAL HIGH (ref 70–99)
Potassium: 4.2 mmol/L (ref 3.5–5.1)
Sodium: 138 mmol/L (ref 135–145)

## 2021-05-03 LAB — PSA: Prostatic Specific Antigen: 0.07 ng/mL (ref 0.00–4.00)

## 2021-05-03 MED ORDER — LEUPROLIDE ACETATE (6 MONTH) 45 MG ~~LOC~~ KIT
45.0000 mg | PACK | Freq: Once | SUBCUTANEOUS | Status: AC
  Administered 2021-05-03: 45 mg via SUBCUTANEOUS
  Filled 2021-05-03: qty 45

## 2021-05-03 MED ORDER — DENOSUMAB 120 MG/1.7ML ~~LOC~~ SOLN
120.0000 mg | Freq: Once | SUBCUTANEOUS | Status: AC
  Administered 2021-05-03: 120 mg via SUBCUTANEOUS
  Filled 2021-05-03: qty 1.7

## 2021-05-03 NOTE — Progress Notes (Signed)
Pt c/o "fuzzy" eyesight. Pt also c/o extreme fatigue especially in the evening despite "a good nights sleep." Pt bp low x2 currently 98/49. After seeing bp pt does endorse lightheadedness for a few days which he thought could be d/t new medications.

## 2021-05-03 NOTE — Progress Notes (Signed)
[facility]  [phone] [phone]  Patient Care Team: Baxter Hire, MD as PCP - General (Internal Medicine) Murrell Redden, MD (Urology) [name] Javier Docker, MD (Cardiology) Lloyd Huger, MD as Consulting Physician (Oncology)   Name of the patient: Earl Ramirez  [id number]  1941-01-18   Date of visit: 05/03/21  HPI: Patient is a 80 y.o. male with metastatic castration resistant prostate cancer presenting for Xtandi (enzalutamide) follow-up at 4 weeks. This was added to his ADT therapy on 02/16/21.  Reason for Consult: Oral chemotherapy follow-up for Xtandi (enzalutamide) therapy.   PAST MEDICAL HISTORY: Past Medical History:  Diagnosis Date   Aortic valve replaced    1998   Arthritis    Atrial fibrillation (HCC)    COPD (chronic obstructive pulmonary disease) (HCC)    Dysrhythmia    atrial fib   Hernia of abdominal wall    Hypertension    Indigestion    Neuropathy    Neuropathy    Prostate cancer (HCC)    Sleep apnea    Thrombocytopenia (HCC)    Tremors of nervous system    Urinary problem     HEMATOLOGY/ONCOLOGY HISTORY:  Oncology History  Prostate cancer ([place])  01/18/2019 Initial Diagnosis   Prostate cancer ([place])   10/14/2019 Cancer Staging   Staging form: Prostate, AJCC 8th Edition - Clinical: Stage IVB (cTX, cN0, pM1b, PSA: 7.6) - Signed by Lloyd Huger, MD on 10/14/2019     ALLERGIES:  is allergic to oxycodone-acetaminophen, oxycodone-acetaminophen, amoxicillin, and penicillins.  MEDICATIONS:  Current Outpatient Medications  Medication Sig Dispense Refill   ALPRAZolam (XANAX) 0.25 MG tablet Take 0.25 mg by mouth daily as needed for anxiety.      aspirin EC 81 MG tablet Take 81 mg by mouth daily.      calcium carbonate (TUMS - DOSED IN MG ELEMENTAL CALCIUM) 500 MG chewable tablet Chew 1 tablet by mouth daily.      docusate sodium (COLACE) 100 MG capsule Take 1 capsule (100  mg total) by mouth 2 (two) times daily. 60 capsule 0   enzalutamide (XTANDI) 40 MG tablet Take 4 tablets (160 mg total) by mouth daily. 120 tablet 2   furosemide (LASIX) 20 MG tablet Take 20 mg by mouth daily.     gabapentin (NEURONTIN) 300 MG capsule Take 900 mg by mouth 4 (four) times daily.      metoprolol succinate (TOPROL-XL) 25 MG 24 hr tablet Take 25 mg by mouth daily.      mirabegron ER (MYRBETRIQ) 50 MG TB24 tablet Take 1 tablet (50 mg total) by mouth daily. 30 tablet 11   sulfamethoxazole-trimethoprim (BACTRIM DS) 800-160 MG tablet Take 1 tablet by mouth 2 (two) times daily. 14 tablet 0   tamsulosin (FLOMAX) 0.4 MG CAPS capsule Take 1 capsule by mouth twice daily 180 capsule 0   No current facility-administered medications for this visit.    VITAL SIGNS: There were no vitals taken for this visit. There were no vitals filed for this visit.  Estimated body mass index is 37.59 kg/m as calculated from the following:   Height as of 04/17/21: 5\' 7"  (1.702 m).   Weight as of 04/17/21: 108.9 kg (240 lb).  LABS: CBC:    Component Value Date/Time   WBC 1.9 (L) 04/03/2021 1305   HGB 12.9 (L) 04/03/2021 1305   HGB 15.1 05/06/2013 1613   HCT 38.4 (L) 04/03/2021 1305   HCT 44.6 05/06/2013 1613  PLT 38 (L) 04/03/2021 1305   PLT 88 (L) 05/06/2013 1613   MCV 101.3 (H) 04/03/2021 1305   MCV 100 05/06/2013 1613   NEUTROABS 1.3 (L) 04/03/2021 1305   NEUTROABS 2.3 05/06/2013 1613   LYMPHSABS 0.4 (L) 04/03/2021 1305   LYMPHSABS 1.1 05/06/2013 1613   MONOABS 0.2 04/03/2021 1305   MONOABS 0.3 05/06/2013 1613   EOSABS 0.0 04/03/2021 1305   EOSABS 0.1 05/06/2013 1613   BASOSABS 0.0 04/03/2021 1305   BASOSABS 0.0 05/06/2013 1613   Comprehensive Metabolic Panel:    Component Value Date/Time   NA 141 04/03/2021 1305   K 4.4 04/03/2021 1305   CL 108 04/03/2021 1305   CO2 26 04/03/2021 1305   BUN 15 04/03/2021 1305   CREATININE 0.58 (L) 04/03/2021 1305   GLUCOSE 95 04/03/2021 1305    CALCIUM 9.1 04/03/2021 1305   AST 38 03/07/2020 1449   ALT 30 03/07/2020 1449   ALKPHOS 47 03/07/2020 1449   BILITOT 1.9 (H) 03/07/2020 1449   PROT 5.8 (L) 03/07/2020 1449   ALBUMIN 3.7 03/07/2020 1449     Present during today's visit: Patient only  Assessment and Plan: Continue Enzalutamide 160mg  daily   Oral Chemotherapy Side Effect/Intolerance:  HTN: BP low today, provided patient with blood pressure cuff to use at home and a blood pressure log to track his blood pressure. Asked him to bring the log back with him to his next appt. He reports some light headiness today, but that a few days ago he was feeling great Bowel movement changes: Patient continues to fluctuate between constipation and diarrhea. He does not think it needs any intervention at this time, but I encouraged him to let us know if his bowel movements move more towards constipation or diarrhea.  Fatigue:  Patient mentioned sleeping a lot more during the day since starting his enzalutamide. He takes his enzalutamide at 11am each day. Suggesting moving his dose to bedtime to see if that improves his daytime fatigue. No reported headache and his appetite is about the same  Other: Recurrent UTIs: currently managed by urology, he was treated with a course of Bactrim and started on mirabegron, he reports he is feeling some what better today  Oral Chemotherapy Adherence: No missed doses reported.  No patient barriers to medication adherence identified.   New medications: None reported  Medication Access Issues: No issues, fills at [facility]. Refill is scheduled for delivery on 05/08/21.  Patient expressed understanding and was in agreement with this plan. He also understands that He can call clinic at any time with any questions, concerns, or complaints.   Follow-up plan: RTC in 1 month  Thank you for allowing me to participate in the care of this very pleasant patient.   Time Total: 20 minutes  Visit  consisted of counseling and education on dealing with issues of symptom management in the setting of serious and potentially life-threatening illness.Greater than 50%  of this time was spent counseling and coordinating care related to the above assessment and plan.  Signed by: Darl Pikes, PharmD, BCPS, Salley Slaughter, CPP Hematology/Oncology Clinical Pharmacist Practitioner [facility] [phone]  05/03/2021 12:28 PM

## 2021-05-06 ENCOUNTER — Encounter: Payer: Self-pay | Admitting: Oncology

## 2021-05-07 ENCOUNTER — Other Ambulatory Visit (HOSPITAL_COMMUNITY): Payer: Self-pay

## 2021-05-08 DIAGNOSIS — G4733 Obstructive sleep apnea (adult) (pediatric): Secondary | ICD-10-CM | POA: Diagnosis not present

## 2021-05-08 DIAGNOSIS — I872 Venous insufficiency (chronic) (peripheral): Secondary | ICD-10-CM | POA: Diagnosis not present

## 2021-05-17 ENCOUNTER — Ambulatory Visit: Payer: PPO | Admitting: Physician Assistant

## 2021-05-22 ENCOUNTER — Ambulatory Visit: Payer: PPO | Admitting: Physician Assistant

## 2021-05-22 ENCOUNTER — Encounter: Payer: Self-pay | Admitting: Physician Assistant

## 2021-05-22 ENCOUNTER — Other Ambulatory Visit: Payer: Self-pay

## 2021-05-22 VITALS — BP 140/80 | HR 74 | Ht 67.0 in | Wt 240.0 lb

## 2021-05-22 DIAGNOSIS — N401 Enlarged prostate with lower urinary tract symptoms: Secondary | ICD-10-CM

## 2021-05-22 DIAGNOSIS — R8271 Bacteriuria: Secondary | ICD-10-CM | POA: Diagnosis not present

## 2021-05-22 LAB — URINALYSIS, COMPLETE
Bilirubin, UA: NEGATIVE
Glucose, UA: NEGATIVE
Ketones, UA: NEGATIVE
Nitrite, UA: POSITIVE — AB
Specific Gravity, UA: 1.025 (ref 1.005–1.030)
Urobilinogen, Ur: 0.2 mg/dL (ref 0.2–1.0)
pH, UA: 5.5 (ref 5.0–7.5)

## 2021-05-22 LAB — MICROSCOPIC EXAMINATION
RBC, Urine: NONE SEEN /hpf (ref 0–2)
WBC, UA: >30 /hpf — ABNORMAL HIGH (ref 0–5)

## 2021-05-22 LAB — BLADDER SCAN AMB NON-IMAGING: Scan Result: 0

## 2021-05-23 NOTE — Progress Notes (Signed)
05/22/2021 3:45 PM   [name] [birth year] [id number]  CC: Chief Complaint  Patient presents with   Other   HPI: Earl Ramirez is a 80 y.o. male with PMH metastatic prostate cancer managed by Dr. 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   Today he reports decreased urinary frequency on Myrbetriq.  He is now able to sleep through the night.  IPSS 10/mostly satisfied as below.  He denies dysuria today  In-office UA today positive for trace lysed blood, 1+ protein, nitrites, and 2+ leukocyte esterase; urine microscopy with >30 WBCs/HPF and many bacteria. PVR 14mL.   IPSS     Row Name 05/22/21 1300         International Prostate Symptom Score   How often have you had the sensation of not emptying your bladder? Less than 1 in 5     How often have you had to urinate less than every two hours? Less than 1 in 5 times     How often have you found you stopped and started again several times when you urinated? Less than 1 in 5 times     How often have you found it difficult to postpone urination? Less than half the time     How often have you had a weak urinary stream? Less than half the time     How often have you had to strain to start urination? Less than half the time     How many times did you typically get up at night to urinate? 1 Time     Total IPSS Score 10           Quality of Life due to urinary symptoms   If you were to spend the rest of your life with your urinary condition just the way it is now how would you feel about that? Mostly Satisfied              PMH: Past Medical History:  Diagnosis Date   Aortic valve replaced    1998   Arthritis    Atrial fibrillation (HCC)    COPD (chronic obstructive pulmonary disease) (HCC)    Dysrhythmia    atrial fib   Hernia of abdominal wall     Hypertension    Indigestion    Neuropathy    Neuropathy    Prostate cancer ([place])    Sleep apnea    Thrombocytopenia (HCC)    Tremors of nervous system    Urinary problem    Surgical History: Past Surgical History:  Procedure Laterality Date   CALDWELL LUC     tongue      growth removed   TRANSURETHRAL RESECTION OF PROSTATE N/A 01/18/2019   Procedure: CHANNEL TRANSURETHRAL RESECTION OF THE PROSTATE (TURP);  Surgeon: [name], MD;  Location: [facility] ORS;  Service: Urology;  Laterality: N/A;   VALVE REPLACEMENT     Home Medications:  Allergies as of 05/22/2021       Reactions   Oxycodone-acetaminophen Nausea Only, Shortness Of Breath   Oxycodone-acetaminophen    Other reaction(s): Drowsy   Amoxicillin Itching   Hand itching   Penicillins Rash   Did it involve swelling of the face/tongue/throat, SOB, or low BP? No Did it involve sudden or severe rash/hives, skin peeling, or any reaction on the inside of your mouth or nose?  No Did you need to seek medical attention at a hospital or doctor's office? No When did it last happen?      4-5 years ago If all above answers are "NO", may proceed with cephalosporin use.        Medication List        Accurate as of May 22, 2021 11:59 PM. If you have any questions, ask your nurse or doctor.          STOP taking these medications    sulfamethoxazole-trimethoprim 800-160 MG tablet Commonly known as: BACTRIM DS Stopped by: Debroah Loop, PA-C       TAKE these medications    ALPRAZolam 0.25 MG tablet Commonly known as: XANAX Take 0.25 mg by mouth daily as needed for anxiety.   aspirin EC 81 MG tablet Take 81 mg by mouth daily.   calcium carbonate 500 MG chewable tablet Commonly known as: TUMS - dosed in mg elemental calcium Chew 1 tablet by mouth daily.   docusate sodium 100 MG capsule Commonly known as: COLACE Take 1 capsule (100 mg total) by mouth 2 (two) times daily.   furosemide 20 MG  tablet Commonly known as: LASIX Take 20 mg by mouth daily.   gabapentin 300 MG capsule Commonly known as: NEURONTIN Take 900 mg by mouth 4 (four) times daily.   metoprolol succinate 25 MG 24 hr tablet Commonly known as: TOPROL-XL Take 25 mg by mouth daily.   mirabegron ER 50 MG Tb24 tablet Commonly known as: MYRBETRIQ Take 1 tablet (50 mg total) by mouth daily.   tamsulosin 0.4 MG Caps capsule Commonly known as: FLOMAX Take 1 capsule by mouth twice daily   Xtandi 40 MG tablet Generic drug: enzalutamide Take 4 tablets (160 mg total) by mouth daily.       Allergies:  Allergies  Allergen Reactions   Oxycodone-Acetaminophen Nausea Only and Shortness Of Breath   Oxycodone-Acetaminophen     Other reaction(s): Drowsy   Amoxicillin Itching    Hand itching   Penicillins Rash    Did it involve swelling of the face/tongue/throat, SOB, or low BP? No Did it involve sudden or severe rash/hives, skin peeling, or any reaction on the inside of your mouth or nose? No Did you need to seek medical attention at a hospital or doctor's office? No When did it last happen?      4-5 years ago If all above answers are "NO", may proceed with cephalosporin use.    Family History: Family History  Problem Relation Age of Onset   Heart attack Father    Alcohol abuse Father    Congestive Heart Failure Mother    Alzheimer's disease Sister    Social History:   reports that he quit smoking about 20 years ago. His smoking use included cigarettes. He quit smokeless tobacco use about 20 years ago. He reports current alcohol use of about 10.0 standard drinks per week. He reports that he does not use drugs.  Physical Exam: BP 140/80   Pulse 74   Ht 5\' 7"  (1.702 m)   Wt 240 lb (108.9 kg)   BMI 37.59 kg/m   Constitutional:  Alert and oriented, no acute distress, nontoxic appearing HEENT: [place], AT Cardiovascular: No clubbing, cyanosis, or edema Respiratory: Normal respiratory effort, no increased  work of breathing Skin: No rashes, bruises or suspicious lesions Neurologic: Grossly intact, no focal deficits, moving all 4 extremities Psychiatric: Normal mood and affect  Laboratory Data: Results for orders placed or performed  in visit on 05/22/21  Microscopic Examination   Urine  Result Value Ref Range   WBC, UA >30 (H) 0 - 5 /hpf   RBC None seen 0 - 2 /hpf   Epithelial Cells (non renal) 0-10 0 - 10 /hpf   Mucus, UA Present (A) Not Estab.   Bacteria, UA Many (A) None seen/Few  Urinalysis, Complete  Result Value Ref Range   Specific Gravity, UA 1.025 1.005 - 1.030   pH, UA 5.5 5.0 - 7.5   Color, UA Yellow Yellow   Appearance Ur Cloudy (A) Clear   Leukocytes,UA 2+ (A) Negative   Protein,UA 1+ (A) Negative/Trace   Glucose, UA Negative Negative   Ketones, UA Negative Negative   RBC, UA Trace (A) Negative   Bilirubin, UA Negative Negative   Urobilinogen, Ur 0.2 0.2 - 1.0 mg/dL   Nitrite, UA Positive (A) Negative   Microscopic Examination See below:   Bladder Scan (Post Void Residual) in office  Result Value Ref Range   Scan Result 0    Assessment & Plan:   1. Benign prostatic hyperplasia with lower urinary tract symptoms, symptom details unspecified Symptoms significantly improved on Myrbetriq 50 mg daily.  He is emptying appropriately.  We will plan to continue this. - Bladder Scan (Post Void Residual) in office  2. Asymptomatic bacteriuria UA appears grossly infected today, however patient is asymptomatic.  Will send for culture but defer treatment unless he develops symptoms.  We discussed that these urinary findings may imply recurrent bladder outlet obstruction, however patient continues to decline cystoscopy today.  If he becomes symptomatic of UTI, I will encouraged him to pursue cystoscopy with Dr. Erlene Quan.  He expressed understanding. - Urinalysis, Complete - CULTURE, URINE COMPREHENSIVE  Return for Will call with results + annual f/u with Dr. Erlene Quan.  Debroah Loop, PA-C  [facility] [address] [place], [place] [address] [phone]

## 2021-05-27 LAB — CULTURE, URINE COMPREHENSIVE

## 2021-05-28 ENCOUNTER — Telehealth: Payer: Self-pay

## 2021-05-28 NOTE — Telephone Encounter (Signed)
-----   Message from Debroah Loop, [place] sent at 05/28/2021  8:50 AM EDT ----- Positive urine culture may represent sample contamination, especially if patient is uncircumcised. Alternatively, asymptomatic bacteriuria versus acute cystitis. If he's having dysuria, urgency, frequency, lower abdominal pain, low back pain, fever, chills, nausea, or vomiting, ok to treat with Bactrim DS BID x7 days. Otherwise ok to defer treatment. ----- Message ----- From: Interface, [facility] Lab Results In Sent: 05/22/2021   4:36 PM EDT To: Debroah Loop, PA-C

## 2021-05-28 NOTE — Telephone Encounter (Signed)
Patient reports his sx as being significantly improved. He states the only symptom he is still experiencing is leakage. Explained to patient that since he is asymptomatic no need to treat. Patient expressed understanding.

## 2021-05-30 ENCOUNTER — Other Ambulatory Visit (HOSPITAL_COMMUNITY): Payer: Self-pay

## 2021-05-31 NOTE — Progress Notes (Signed)
[facility]  Telephone:([phone] [phone]) [phone]  ID: Earl Ramirez OB: 19-Jun-1941  MR#: [id number]  XYI#:[id number]  Patient Care Team: Baxter Hire, MD as PCP - General (Internal Medicine) Murrell Redden, MD (Urology) [name] Javier Docker, MD (Cardiology) Lloyd Huger, MD as Consulting Physician (Oncology)  CHIEF COMPLAINT: Stage IVB prostate cancer.  INTERVAL HISTORY: Patient returns to clinic today for repeat laboratory, further evaluation, and continuation of Xgeva and Xtandi.  He has chronic weakness and fatigue, but otherwise is tolerating his treatments well.  He does not complain of pain today.  He has no neurologic complaints.  He denies any recent fevers or illnesses.  He has a good appetite and denies weight loss. He has no chest pain, shortness of breath, cough, or hemoptysis.  He denies any nausea, vomiting, constipation, or diarrhea.  He continues to have urinary frequency.  Patient offers no further specific complaints today.  REVIEW OF SYSTEMS:   Review of Systems  Constitutional:  Positive for malaise/fatigue. Negative for fever and weight loss.  Respiratory: Negative.  Negative for cough, hemoptysis and shortness of breath.   Cardiovascular: Negative.  Negative for chest pain and leg swelling.  Gastrointestinal: Negative.  Negative for abdominal pain and constipation.  Genitourinary:  Positive for frequency.  Musculoskeletal: Negative.  Negative for back pain and neck pain.  Skin: Negative.  Negative for rash.  Neurological:  Positive for weakness. Negative for dizziness, focal weakness and headaches.  Psychiatric/Behavioral: Negative.  The patient is not nervous/anxious.    As per HPI. Otherwise, a complete review of systems is negative.  PAST MEDICAL HISTORY: Past Medical History:  Diagnosis Date   Aortic valve replaced    1998   Arthritis    Atrial fibrillation (HCC)    COPD (chronic obstructive pulmonary disease) (HCC)     Dysrhythmia    atrial fib   Hernia of abdominal wall    Hypertension    Indigestion    Neuropathy    Neuropathy    Prostate cancer ([place])    Sleep apnea    Thrombocytopenia (HCC)    Tremors of nervous system    Urinary problem     PAST SURGICAL HISTORY: Past Surgical History:  Procedure Laterality Date   CALDWELL LUC     tongue      growth removed   TRANSURETHRAL RESECTION OF PROSTATE N/A 01/18/2019   Procedure: CHANNEL TRANSURETHRAL RESECTION OF THE PROSTATE (TURP);  Surgeon: [name], MD;  Location: [facility] ORS;  Service: Urology;  Laterality: N/A;   VALVE REPLACEMENT      FAMILY HISTORY: Family History  Problem Relation Age of Onset   Heart attack Father    Alcohol abuse Father    Congestive Heart Failure Mother    Alzheimer's disease Sister     ADVANCED DIRECTIVES (Y/N):  N  HEALTH MAINTENANCE: Social History   Tobacco Use   Smoking status: Former    Types: Cigarettes    Quit date: 01/13/2001    Years since quitting: 20.4   Smokeless tobacco: Former    Quit date: 09/24/2000  Vaping Use   Vaping Use: Never used  Substance Use Topics   Alcohol use: Yes    Alcohol/week: 10.0 standard drinks    Types: 10 Cans of beer per week   Drug use: No     Colonoscopy:  PAP:  Bone density:  Lipid panel:  Allergies  Allergen Reactions   Oxycodone-Acetaminophen Nausea Only and Shortness Of Breath   Oxycodone-Acetaminophen  Other reaction(s): Drowsy   Amoxicillin Itching    Hand itching   Penicillins Rash    Did it involve swelling of the face/tongue/throat, SOB, or low BP? No Did it involve sudden or severe rash/hives, skin peeling, or any reaction on the inside of your mouth or nose? No Did you need to seek medical attention at a hospital or doctor's office? No When did it last happen?      4-5 years ago If all above answers are "NO", may proceed with cephalosporin use.     Current Outpatient Medications  Medication Sig Dispense Refill   ALPRAZolam  (XANAX) 0.25 MG tablet Take 0.25 mg by mouth daily as needed for anxiety.      aspirin EC 81 MG tablet Take 81 mg by mouth daily.      calcium carbonate (TUMS - DOSED IN MG ELEMENTAL CALCIUM) 500 MG chewable tablet Chew 1 tablet by mouth daily.      docusate sodium (COLACE) 100 MG capsule Take 1 capsule (100 mg total) by mouth 2 (two) times daily. 60 capsule 0   enzalutamide (XTANDI) 40 MG tablet Take 4 tablets (160 mg total) by mouth daily. 120 tablet 2   furosemide (LASIX) 20 MG tablet Take 20 mg by mouth daily.     gabapentin (NEURONTIN) 300 MG capsule Take 900 mg by mouth 4 (four) times daily.      metoprolol succinate (TOPROL-XL) 25 MG 24 hr tablet Take 25 mg by mouth daily.      mirabegron ER (MYRBETRIQ) 50 MG TB24 tablet Take 1 tablet (50 mg total) by mouth daily. 30 tablet 11   tamsulosin (FLOMAX) 0.4 MG CAPS capsule Take 1 capsule by mouth twice daily 180 capsule 0   No current facility-administered medications for this visit.    OBJECTIVE: Vitals:   06/05/21 1308  BP: 120/62  Pulse: 81  Resp: 16  Temp: (!) 97.2 F (36.2 C)     Body mass index is 37.57 kg/m.    ECOG FS:1 - Symptomatic but completely ambulatory  General: Well-developed, well-nourished, no acute distress.  Sitting in a wheelchair. Eyes: Pink conjunctiva, anicteric sclera. HEENT: Normocephalic, moist mucous membranes. Lungs: No audible wheezing or coughing. Heart: Regular rate and rhythm. Abdomen: Soft, nontender, no obvious distention. Musculoskeletal: No edema, cyanosis, or clubbing. Neuro: Alert, answering all questions appropriately. Cranial nerves grossly intact. Skin: No rashes or petechiae noted. Psych: Normal affect.  LAB RESULTS:  Lab Results  Component Value Date   NA 142 06/05/2021   K 3.8 06/05/2021   CL 106 06/05/2021   CO2 27 06/05/2021   GLUCOSE 96 06/05/2021   BUN 19 06/05/2021   CREATININE 0.62 06/05/2021   CALCIUM 8.8 (L) 06/05/2021   PROT 5.8 (L) 03/07/2020   ALBUMIN 3.7  03/07/2020   AST 38 03/07/2020   ALT 30 03/07/2020   ALKPHOS 47 03/07/2020   BILITOT 1.9 (H) 03/07/2020   GFRNONAA >60 06/05/2021   GFRAA >60 04/21/2020    Lab Results  Component Value Date   WBC 2.4 (L) 06/05/2021   NEUTROABS 1.7 06/05/2021   HGB 12.6 (L) 06/05/2021   HCT 37.4 (L) 06/05/2021   MCV 105.1 (H) 06/05/2021   PLT 36 (L) 06/05/2021     STUDIES: No results found.  ASSESSMENT: Stage IVB prostate cancer  PLAN:    1.  Stage IVB prostate cancer: Patient initially received XRT for local control disease in 2016. His PSA initially trended down to 5.37, but then trended up to  14.77. Pylarify PET scan completed on January 23, 2021 reviewed independently with metastatic disease and bilateral common iliac nodes, retroperitoneal nodes, and multiple mediastinal lymph nodes.  Patient also noted to have FDG-avid 6 or 7 skeletal metastasis.  Patient initiated Gillermina Phy in approximately July 2022.  His PSA continues to trend down and is now 0.02. Continue current dose until intolerable side effects or progression of disease.  Proceed with Xgeva today.  Patient last received Eligard on May 03, 2021.  Return to clinic in 4 weeks for further evaluation by clinical pharmacy and continuation of treatment.  Patient will then return to clinic in 8 weeks for evaluation and continuation of treatment. 2.  Pancytopenia: Chronic and unchanged.  Continue to monitor closely.  Can consider bone marrow biopsy in the future if necessary. 3.  Hypocalcemia: Mild.  Proceed with Xgeva as above.  Continue oral calcium supplementation. 4.  Urinary frequency: Chronic and unchanged.  States previously, UA and culture did not reveal significant infection.  Patient was previously given a referral back to urology. 5.  Elevated bilirubin: Chronic and unchanged.  Patient expressed understanding and was in agreement with this plan. He also understands that He can call clinic at any time with any questions, concerns, or  complaints.   Cancer Staging Prostate cancer [facility]) Staging form: Prostate, AJCC 8th Edition - Clinical: Stage IVB (cTX, cN0, pM1b, PSA: 7.6) - Signed by Lloyd Huger, MD on 10/14/2019 Prostate specific antigen (PSA) range: Less than 10   Lloyd Huger, MD   06/06/2021 10:02 AM

## 2021-06-04 ENCOUNTER — Other Ambulatory Visit (HOSPITAL_COMMUNITY): Payer: Self-pay

## 2021-06-04 NOTE — Progress Notes (Signed)
[place]  [phone] [phone]  Patient Care Team: Baxter Hire, MD as PCP - General (Internal Medicine) Murrell Redden, MD (Urology) [name] Javier Docker, MD (Cardiology) Lloyd Huger, MD as Consulting Physician (Oncology)   Name of the patient: Earl Ramirez  [id number]  25-Jul-1941   Date of visit: 06/05/21  HPI: Patient is a 80 y.o. male with metastatic castration resistant prostate cancer presenting for Xtandi (enzalutamide) follow-up at 4 weeks. This was added to his ADT therapy on 02/16/21.  Reason for Consult: Oral chemotherapy follow-up for Xtandi (enzalutamide) therapy.   PAST MEDICAL HISTORY: Past Medical History:  Diagnosis Date   Aortic valve replaced    1998   Arthritis    Atrial fibrillation (HCC)    COPD (chronic obstructive pulmonary disease) (HCC)    Dysrhythmia    atrial fib   Hernia of abdominal wall    Hypertension    Indigestion    Neuropathy    Neuropathy    Prostate cancer (HCC)    Sleep apnea    Thrombocytopenia (HCC)    Tremors of nervous system    Urinary problem     HEMATOLOGY/ONCOLOGY HISTORY:  Oncology History  Prostate cancer ([place])  01/18/2019 Initial Diagnosis   Prostate cancer ([place])   10/14/2019 Cancer Staging   Staging form: Prostate, AJCC 8th Edition - Clinical: Stage IVB (cTX, cN0, pM1b, PSA: 7.6) - Signed by Lloyd Huger, MD on 10/14/2019      ALLERGIES:  is allergic to oxycodone-acetaminophen, oxycodone-acetaminophen, amoxicillin, and penicillins.  MEDICATIONS:  Current Outpatient Medications  Medication Sig Dispense Refill   ALPRAZolam (XANAX) 0.25 MG tablet Take 0.25 mg by mouth daily as needed for anxiety.      aspirin EC 81 MG tablet Take 81 mg by mouth daily.      calcium carbonate (TUMS - DOSED IN MG ELEMENTAL CALCIUM) 500 MG chewable tablet Chew 1 tablet by mouth daily.      docusate sodium (COLACE) 100 MG capsule Take 1 capsule  (100 mg total) by mouth 2 (two) times daily. 60 capsule 0   enzalutamide (XTANDI) 40 MG tablet Take 4 tablets (160 mg total) by mouth daily. 120 tablet 2   furosemide (LASIX) 20 MG tablet Take 20 mg by mouth daily.     gabapentin (NEURONTIN) 300 MG capsule Take 900 mg by mouth 4 (four) times daily.      metoprolol succinate (TOPROL-XL) 25 MG 24 hr tablet Take 25 mg by mouth daily.      mirabegron ER (MYRBETRIQ) 50 MG TB24 tablet Take 1 tablet (50 mg total) by mouth daily. 30 tablet 11   tamsulosin (FLOMAX) 0.4 MG CAPS capsule Take 1 capsule by mouth twice daily 180 capsule 0   No current facility-administered medications for this visit.    VITAL SIGNS: There were no vitals taken for this visit. There were no vitals filed for this visit.  Estimated body mass index is 37.57 kg/m as calculated from the following:   Height as of 05/22/21: 5\' 7"  (1.702 m).   Weight as of an earlier encounter on 06/05/21: 108.8 kg (239 lb 14.4 oz).  LABS: CBC:    Component Value Date/Time   WBC 2.4 (L) 06/05/2021 1250   HGB 12.6 (L) 06/05/2021 1250   HGB 15.1 05/06/2013 1613   HCT 37.4 (L) 06/05/2021 1250   HCT 44.6 05/06/2013 1613   PLT 36 (L) 06/05/2021 1250   PLT 88 (L) 05/06/2013  1613   MCV 105.1 (H) 06/05/2021 1250   MCV 100 05/06/2013 1613   NEUTROABS 1.7 06/05/2021 1250   NEUTROABS 2.3 05/06/2013 1613   LYMPHSABS 0.5 (L) 06/05/2021 1250   LYMPHSABS 1.1 05/06/2013 1613   MONOABS 0.2 06/05/2021 1250   MONOABS 0.3 05/06/2013 1613   EOSABS 0.1 06/05/2021 1250   EOSABS 0.1 05/06/2013 1613   BASOSABS 0.0 06/05/2021 1250   BASOSABS 0.0 05/06/2013 1613   Comprehensive Metabolic Panel:    Component Value Date/Time   NA 142 06/05/2021 1250   K 3.8 06/05/2021 1250   CL 106 06/05/2021 1250   CO2 27 06/05/2021 1250   BUN 19 06/05/2021 1250   CREATININE 0.62 06/05/2021 1250   GLUCOSE 96 06/05/2021 1250   CALCIUM 8.8 (L) 06/05/2021 1250   AST 38 03/07/2020 1449   ALT 30 03/07/2020 1449    ALKPHOS 47 03/07/2020 1449   BILITOT 1.9 (H) 03/07/2020 1449   PROT 5.8 (L) 03/07/2020 1449   ALBUMIN 3.7 03/07/2020 1449     Present during today's visit: Patient only  Assessment and Plan: Continue Enzalutamide 160mg  daily   Oral Chemotherapy Side Effect/Intolerance:  HTN: BP better today, encouraged him to check his blood pressure at home occasionally so we have more readings to evaluate Bowel movement changes: Patient continues to fluctuate between constipation and diarrhea. He reported that he does not like to go out much for fear of having a diarrhea incident while out. Sugessted he start using loperamide if he feels like his bowel movements are loose and he worries about being able to leaving the house.  Fatigue:  no change  Oral Chemotherapy Adherence: No missed doses reported.  No patient barriers to medication adherence identified.   New medications: None reported  Medication Access Issues: No issues, fills at [facility]. Medication scheduled for delivery tomorrow 06/06/21  Patient expressed understanding and was in agreement with this plan. He also understands that He can call clinic at any time with any questions, concerns, or complaints.   Follow-up plan: RTC in 1 month  Thank you for allowing me to participate in the care of this very pleasant patient.   Time Total: 15 minutes  Visit consisted of counseling and education on dealing with issues of symptom management in the setting of serious and potentially life-threatening illness.Greater than 50%  of this time was spent counseling and coordinating care related to the above assessment and plan.  Signed by: Darl Pikes, PharmD, BCPS, Salley Slaughter, CPP Hematology/Oncology Clinical Pharmacist Practitioner [facility]/AP [facility] [phone]  06/05/2021 3:18 PM

## 2021-06-05 ENCOUNTER — Inpatient Hospital Stay (HOSPITAL_BASED_OUTPATIENT_CLINIC_OR_DEPARTMENT_OTHER): Payer: PPO | Admitting: Oncology

## 2021-06-05 ENCOUNTER — Inpatient Hospital Stay: Payer: PPO

## 2021-06-05 ENCOUNTER — Other Ambulatory Visit: Payer: Self-pay

## 2021-06-05 ENCOUNTER — Encounter: Payer: Self-pay | Admitting: Oncology

## 2021-06-05 ENCOUNTER — Other Ambulatory Visit (HOSPITAL_COMMUNITY): Payer: Self-pay

## 2021-06-05 ENCOUNTER — Inpatient Hospital Stay: Payer: PPO | Attending: Oncology

## 2021-06-05 ENCOUNTER — Inpatient Hospital Stay: Payer: PPO | Admitting: Pharmacist

## 2021-06-05 VITALS — BP 120/62 | HR 81 | Temp 97.2°F | Resp 16 | Wt 239.9 lb

## 2021-06-05 DIAGNOSIS — Z7982 Long term (current) use of aspirin: Secondary | ICD-10-CM | POA: Insufficient documentation

## 2021-06-05 DIAGNOSIS — G4730 Sleep apnea, unspecified: Secondary | ICD-10-CM | POA: Diagnosis not present

## 2021-06-05 DIAGNOSIS — C61 Malignant neoplasm of prostate: Secondary | ICD-10-CM

## 2021-06-05 DIAGNOSIS — R350 Frequency of micturition: Secondary | ICD-10-CM | POA: Diagnosis not present

## 2021-06-05 DIAGNOSIS — Z952 Presence of prosthetic heart valve: Secondary | ICD-10-CM | POA: Diagnosis not present

## 2021-06-05 DIAGNOSIS — I10 Essential (primary) hypertension: Secondary | ICD-10-CM | POA: Insufficient documentation

## 2021-06-05 DIAGNOSIS — R531 Weakness: Secondary | ICD-10-CM | POA: Diagnosis not present

## 2021-06-05 DIAGNOSIS — D61818 Other pancytopenia: Secondary | ICD-10-CM | POA: Diagnosis not present

## 2021-06-05 DIAGNOSIS — Z87891 Personal history of nicotine dependence: Secondary | ICD-10-CM | POA: Insufficient documentation

## 2021-06-05 DIAGNOSIS — J449 Chronic obstructive pulmonary disease, unspecified: Secondary | ICD-10-CM | POA: Diagnosis not present

## 2021-06-05 DIAGNOSIS — Z79899 Other long term (current) drug therapy: Secondary | ICD-10-CM | POA: Insufficient documentation

## 2021-06-05 DIAGNOSIS — I4891 Unspecified atrial fibrillation: Secondary | ICD-10-CM | POA: Diagnosis not present

## 2021-06-05 DIAGNOSIS — R5383 Other fatigue: Secondary | ICD-10-CM | POA: Insufficient documentation

## 2021-06-05 LAB — CBC WITH DIFFERENTIAL/PLATELET
Abs Immature Granulocytes: 0.01 10*3/uL (ref 0.00–0.07)
Basophils Absolute: 0 10*3/uL (ref 0.0–0.1)
Basophils Relative: 0 %
Eosinophils Absolute: 0.1 10*3/uL (ref 0.0–0.5)
Eosinophils Relative: 3 %
HCT: 37.4 % — ABNORMAL LOW (ref 39.0–52.0)
Hemoglobin: 12.6 g/dL — ABNORMAL LOW (ref 13.0–17.0)
Immature Granulocytes: 0 %
Lymphocytes Relative: 18 %
Lymphs Abs: 0.5 10*3/uL — ABNORMAL LOW (ref 0.7–4.0)
MCH: 35.4 pg — ABNORMAL HIGH (ref 26.0–34.0)
MCHC: 33.7 g/dL (ref 30.0–36.0)
MCV: 105.1 fL — ABNORMAL HIGH (ref 80.0–100.0)
Monocytes Absolute: 0.2 10*3/uL (ref 0.1–1.0)
Monocytes Relative: 10 %
Neutro Abs: 1.7 10*3/uL (ref 1.7–7.7)
Neutrophils Relative %: 69 %
Platelets: 36 10*3/uL — ABNORMAL LOW (ref 150–400)
RBC: 3.56 MIL/uL — ABNORMAL LOW (ref 4.22–5.81)
RDW: 15 % (ref 11.5–15.5)
Smear Review: NORMAL
WBC: 2.4 10*3/uL — ABNORMAL LOW (ref 4.0–10.5)
nRBC: 0 % (ref 0.0–0.2)

## 2021-06-05 LAB — BASIC METABOLIC PANEL
Anion gap: 9 (ref 5–15)
BUN: 19 mg/dL (ref 8–23)
CO2: 27 mmol/L (ref 22–32)
Calcium: 8.8 mg/dL — ABNORMAL LOW (ref 8.9–10.3)
Chloride: 106 mmol/L (ref 98–111)
Creatinine, Ser: 0.62 mg/dL (ref 0.61–1.24)
GFR, Estimated: >60 mL/min (ref >60)
Glucose, Bld: 96 mg/dL (ref 70–99)
Potassium: 3.8 mmol/L (ref 3.5–5.1)
Sodium: 142 mmol/L (ref 135–145)

## 2021-06-05 LAB — PSA: Prostatic Specific Antigen: 0.02 ng/mL (ref 0.00–4.00)

## 2021-06-05 MED ORDER — DENOSUMAB 120 MG/1.7ML ~~LOC~~ SOLN
120.0000 mg | Freq: Once | SUBCUTANEOUS | Status: AC
Start: 2021-06-05 — End: 2021-06-05
  Administered 2021-06-05: 120 mg via SUBCUTANEOUS
  Filled 2021-06-05: qty 1.7

## 2021-06-05 NOTE — Progress Notes (Signed)
Patient reports an increase in daytime drossiness during the day with Xtandi.

## 2021-06-06 ENCOUNTER — Encounter: Payer: Self-pay | Admitting: Oncology

## 2021-06-08 DIAGNOSIS — G4733 Obstructive sleep apnea (adult) (pediatric): Secondary | ICD-10-CM | POA: Diagnosis not present

## 2021-06-08 DIAGNOSIS — I872 Venous insufficiency (chronic) (peripheral): Secondary | ICD-10-CM | POA: Diagnosis not present

## 2021-06-17 ENCOUNTER — Other Ambulatory Visit: Payer: Self-pay

## 2021-06-17 DIAGNOSIS — D696 Thrombocytopenia, unspecified: Secondary | ICD-10-CM | POA: Diagnosis not present

## 2021-06-17 DIAGNOSIS — M48061 Spinal stenosis, lumbar region without neurogenic claudication: Secondary | ICD-10-CM | POA: Diagnosis not present

## 2021-06-17 DIAGNOSIS — C61 Malignant neoplasm of prostate: Secondary | ICD-10-CM | POA: Diagnosis not present

## 2021-06-17 DIAGNOSIS — M47814 Spondylosis without myelopathy or radiculopathy, thoracic region: Secondary | ICD-10-CM | POA: Diagnosis not present

## 2021-06-17 DIAGNOSIS — R531 Weakness: Secondary | ICD-10-CM | POA: Diagnosis present

## 2021-06-17 DIAGNOSIS — Z820 Family history of epilepsy and other diseases of the nervous system: Secondary | ICD-10-CM

## 2021-06-17 DIAGNOSIS — Z7982 Long term (current) use of aspirin: Secondary | ICD-10-CM

## 2021-06-17 DIAGNOSIS — K7469 Other cirrhosis of liver: Secondary | ICD-10-CM | POA: Diagnosis present

## 2021-06-17 DIAGNOSIS — R29898 Other symptoms and signs involving the musculoskeletal system: Secondary | ICD-10-CM | POA: Diagnosis not present

## 2021-06-17 DIAGNOSIS — I248 Other forms of acute ischemic heart disease: Secondary | ICD-10-CM | POA: Diagnosis present

## 2021-06-17 DIAGNOSIS — J449 Chronic obstructive pulmonary disease, unspecified: Secondary | ICD-10-CM | POA: Diagnosis not present

## 2021-06-17 DIAGNOSIS — Z8669 Personal history of other diseases of the nervous system and sense organs: Secondary | ICD-10-CM | POA: Diagnosis not present

## 2021-06-17 DIAGNOSIS — Z8249 Family history of ischemic heart disease and other diseases of the circulatory system: Secondary | ICD-10-CM

## 2021-06-17 DIAGNOSIS — L03116 Cellulitis of left lower limb: Secondary | ICD-10-CM | POA: Diagnosis present

## 2021-06-17 DIAGNOSIS — I890 Lymphedema, not elsewhere classified: Secondary | ICD-10-CM | POA: Diagnosis not present

## 2021-06-17 DIAGNOSIS — R159 Full incontinence of feces: Secondary | ICD-10-CM | POA: Diagnosis present

## 2021-06-17 DIAGNOSIS — G4733 Obstructive sleep apnea (adult) (pediatric): Secondary | ICD-10-CM | POA: Diagnosis not present

## 2021-06-17 DIAGNOSIS — R609 Edema, unspecified: Secondary | ICD-10-CM | POA: Diagnosis not present

## 2021-06-17 DIAGNOSIS — I517 Cardiomegaly: Secondary | ICD-10-CM | POA: Diagnosis not present

## 2021-06-17 DIAGNOSIS — E041 Nontoxic single thyroid nodule: Secondary | ICD-10-CM | POA: Diagnosis not present

## 2021-06-17 DIAGNOSIS — I10 Essential (primary) hypertension: Secondary | ICD-10-CM | POA: Diagnosis present

## 2021-06-17 DIAGNOSIS — B957 Other staphylococcus as the cause of diseases classified elsewhere: Secondary | ICD-10-CM | POA: Diagnosis not present

## 2021-06-17 DIAGNOSIS — N3001 Acute cystitis with hematuria: Secondary | ICD-10-CM | POA: Diagnosis not present

## 2021-06-17 DIAGNOSIS — Z9079 Acquired absence of other genital organ(s): Secondary | ICD-10-CM | POA: Diagnosis not present

## 2021-06-17 DIAGNOSIS — B964 Proteus (mirabilis) (morganii) as the cause of diseases classified elsewhere: Secondary | ICD-10-CM | POA: Diagnosis present

## 2021-06-17 DIAGNOSIS — C7951 Secondary malignant neoplasm of bone: Secondary | ICD-10-CM | POA: Diagnosis not present

## 2021-06-17 DIAGNOSIS — K8020 Calculus of gallbladder without cholecystitis without obstruction: Secondary | ICD-10-CM | POA: Diagnosis present

## 2021-06-17 DIAGNOSIS — M40204 Unspecified kyphosis, thoracic region: Secondary | ICD-10-CM | POA: Diagnosis not present

## 2021-06-17 DIAGNOSIS — Z8546 Personal history of malignant neoplasm of prostate: Secondary | ICD-10-CM | POA: Diagnosis not present

## 2021-06-17 DIAGNOSIS — Z880 Allergy status to penicillin: Secondary | ICD-10-CM

## 2021-06-17 DIAGNOSIS — N3281 Overactive bladder: Secondary | ICD-10-CM | POA: Diagnosis not present

## 2021-06-17 DIAGNOSIS — K766 Portal hypertension: Secondary | ICD-10-CM | POA: Diagnosis present

## 2021-06-17 DIAGNOSIS — L03115 Cellulitis of right lower limb: Secondary | ICD-10-CM | POA: Diagnosis present

## 2021-06-17 DIAGNOSIS — C799 Secondary malignant neoplasm of unspecified site: Secondary | ICD-10-CM | POA: Diagnosis not present

## 2021-06-17 DIAGNOSIS — M47816 Spondylosis without myelopathy or radiculopathy, lumbar region: Secondary | ICD-10-CM | POA: Diagnosis not present

## 2021-06-17 DIAGNOSIS — Z87891 Personal history of nicotine dependence: Secondary | ICD-10-CM

## 2021-06-17 DIAGNOSIS — E860 Dehydration: Secondary | ICD-10-CM | POA: Diagnosis not present

## 2021-06-17 DIAGNOSIS — Z20822 Contact with and (suspected) exposure to covid-19: Secondary | ICD-10-CM | POA: Diagnosis present

## 2021-06-17 DIAGNOSIS — M549 Dorsalgia, unspecified: Secondary | ICD-10-CM | POA: Diagnosis present

## 2021-06-17 DIAGNOSIS — R161 Splenomegaly, not elsewhere classified: Secondary | ICD-10-CM | POA: Diagnosis present

## 2021-06-17 DIAGNOSIS — G629 Polyneuropathy, unspecified: Secondary | ICD-10-CM | POA: Diagnosis present

## 2021-06-17 DIAGNOSIS — Z79899 Other long term (current) drug therapy: Secondary | ICD-10-CM

## 2021-06-17 DIAGNOSIS — Z885 Allergy status to narcotic agent status: Secondary | ICD-10-CM

## 2021-06-17 DIAGNOSIS — Z952 Presence of prosthetic heart valve: Secondary | ICD-10-CM | POA: Diagnosis not present

## 2021-06-17 DIAGNOSIS — R0902 Hypoxemia: Secondary | ICD-10-CM | POA: Diagnosis not present

## 2021-06-17 DIAGNOSIS — J438 Other emphysema: Secondary | ICD-10-CM | POA: Diagnosis not present

## 2021-06-17 DIAGNOSIS — R8281 Pyuria: Secondary | ICD-10-CM | POA: Diagnosis not present

## 2021-06-17 DIAGNOSIS — I482 Chronic atrial fibrillation, unspecified: Secondary | ICD-10-CM | POA: Diagnosis not present

## 2021-06-17 DIAGNOSIS — M5126 Other intervertebral disc displacement, lumbar region: Secondary | ICD-10-CM | POA: Diagnosis not present

## 2021-06-17 DIAGNOSIS — E876 Hypokalemia: Secondary | ICD-10-CM | POA: Diagnosis present

## 2021-06-17 DIAGNOSIS — J90 Pleural effusion, not elsewhere classified: Secondary | ICD-10-CM | POA: Diagnosis not present

## 2021-06-17 DIAGNOSIS — M4696 Unspecified inflammatory spondylopathy, lumbar region: Secondary | ICD-10-CM | POA: Diagnosis not present

## 2021-06-17 DIAGNOSIS — M5135 Other intervertebral disc degeneration, thoracolumbar region: Secondary | ICD-10-CM | POA: Diagnosis not present

## 2021-06-17 DIAGNOSIS — R188 Other ascites: Secondary | ICD-10-CM | POA: Diagnosis not present

## 2021-06-17 DIAGNOSIS — G9389 Other specified disorders of brain: Secondary | ICD-10-CM | POA: Diagnosis not present

## 2021-06-17 LAB — BASIC METABOLIC PANEL
Anion gap: 7 (ref 5–15)
BUN: 22 mg/dL (ref 8–23)
CO2: 24 mmol/L (ref 22–32)
Calcium: 8.2 mg/dL — ABNORMAL LOW (ref 8.9–10.3)
Chloride: 108 mmol/L (ref 98–111)
Creatinine, Ser: 0.69 mg/dL (ref 0.61–1.24)
GFR, Estimated: >60 mL/min (ref >60)
Glucose, Bld: 111 mg/dL — ABNORMAL HIGH (ref 70–99)
Potassium: 3.4 mmol/L — ABNORMAL LOW (ref 3.5–5.1)
Sodium: 139 mmol/L (ref 135–145)

## 2021-06-17 LAB — CBC
HCT: 39.3 % (ref 39.0–52.0)
Hemoglobin: 13.4 g/dL (ref 13.0–17.0)
MCH: 36.6 pg — ABNORMAL HIGH (ref 26.0–34.0)
MCHC: 34.1 g/dL (ref 30.0–36.0)
MCV: 107.4 fL — ABNORMAL HIGH (ref 80.0–100.0)
Platelets: 41 10*3/uL — ABNORMAL LOW (ref 150–400)
RBC: 3.66 MIL/uL — ABNORMAL LOW (ref 4.22–5.81)
RDW: 15.3 % (ref 11.5–15.5)
WBC: 3.9 10*3/uL — ABNORMAL LOW (ref 4.0–10.5)
nRBC: 0 % (ref 0.0–0.2)

## 2021-06-17 LAB — TROPONIN I (HIGH SENSITIVITY)
Troponin I (High Sensitivity): 37 ng/L — ABNORMAL HIGH (ref <18)
Troponin I (High Sensitivity): 33 ng/L — ABNORMAL HIGH (ref <18)

## 2021-06-17 NOTE — ED Notes (Signed)
Patient to waiting room via EMS via wheelchair from home.  EMS reports patient with generalized weakness for "a long time", lower legs red and swollen, ambulatory with maximum assistance.  EMS interventions -- bp 162/61, hr 105, pulse oxi 98% room air, cbg 135.

## 2021-06-17 NOTE — ED Notes (Signed)
Rainbow sent to the lab at this time.  

## 2021-06-17 NOTE — ED Triage Notes (Signed)
Pt presents to ER via ems from home.  Per pt, this morning he was unable to get out of bed.  Pt states he has been weak for a while now, but today it has become worse.  Pt denies being around any sick persons.  Pt A&O x4 at this time and in NAD.

## 2021-06-18 ENCOUNTER — Emergency Department: Payer: PPO

## 2021-06-18 ENCOUNTER — Inpatient Hospital Stay
Admission: EM | Admit: 2021-06-18 | Discharge: 2021-06-22 | DRG: 690 | Disposition: A | Discharge status: Home Health | Payer: PPO | Attending: Internal Medicine | Admitting: Internal Medicine

## 2021-06-18 DIAGNOSIS — J438 Other emphysema: Secondary | ICD-10-CM | POA: Diagnosis not present

## 2021-06-18 DIAGNOSIS — C799 Secondary malignant neoplasm of unspecified site: Secondary | ICD-10-CM

## 2021-06-18 DIAGNOSIS — R8281 Pyuria: Secondary | ICD-10-CM | POA: Diagnosis not present

## 2021-06-18 DIAGNOSIS — D696 Thrombocytopenia, unspecified: Secondary | ICD-10-CM | POA: Diagnosis present

## 2021-06-18 DIAGNOSIS — C61 Malignant neoplasm of prostate: Secondary | ICD-10-CM | POA: Diagnosis present

## 2021-06-18 DIAGNOSIS — K766 Portal hypertension: Secondary | ICD-10-CM | POA: Diagnosis present

## 2021-06-18 DIAGNOSIS — B957 Other staphylococcus as the cause of diseases classified elsewhere: Secondary | ICD-10-CM | POA: Diagnosis present

## 2021-06-18 DIAGNOSIS — I10 Essential (primary) hypertension: Secondary | ICD-10-CM | POA: Diagnosis present

## 2021-06-18 DIAGNOSIS — I248 Other forms of acute ischemic heart disease: Secondary | ICD-10-CM | POA: Diagnosis present

## 2021-06-18 DIAGNOSIS — C7982 Secondary malignant neoplasm of genital organs: Secondary | ICD-10-CM

## 2021-06-18 DIAGNOSIS — L0390 Cellulitis, unspecified: Secondary | ICD-10-CM

## 2021-06-18 DIAGNOSIS — G4733 Obstructive sleep apnea (adult) (pediatric): Secondary | ICD-10-CM | POA: Diagnosis present

## 2021-06-18 DIAGNOSIS — R531 Weakness: Secondary | ICD-10-CM | POA: Diagnosis present

## 2021-06-18 DIAGNOSIS — E041 Nontoxic single thyroid nodule: Secondary | ICD-10-CM

## 2021-06-18 DIAGNOSIS — M47816 Spondylosis without myelopathy or radiculopathy, lumbar region: Secondary | ICD-10-CM | POA: Diagnosis not present

## 2021-06-18 DIAGNOSIS — Z20822 Contact with and (suspected) exposure to covid-19: Secondary | ICD-10-CM | POA: Diagnosis present

## 2021-06-18 DIAGNOSIS — R159 Full incontinence of feces: Secondary | ICD-10-CM | POA: Diagnosis present

## 2021-06-18 DIAGNOSIS — N3001 Acute cystitis with hematuria: Secondary | ICD-10-CM

## 2021-06-18 DIAGNOSIS — L03116 Cellulitis of left lower limb: Secondary | ICD-10-CM | POA: Diagnosis present

## 2021-06-18 DIAGNOSIS — Z952 Presence of prosthetic heart valve: Secondary | ICD-10-CM | POA: Diagnosis not present

## 2021-06-18 DIAGNOSIS — I482 Chronic atrial fibrillation, unspecified: Secondary | ICD-10-CM

## 2021-06-18 DIAGNOSIS — C7951 Secondary malignant neoplasm of bone: Secondary | ICD-10-CM | POA: Diagnosis present

## 2021-06-18 DIAGNOSIS — J449 Chronic obstructive pulmonary disease, unspecified: Secondary | ICD-10-CM | POA: Diagnosis present

## 2021-06-18 DIAGNOSIS — R29898 Other symptoms and signs involving the musculoskeletal system: Secondary | ICD-10-CM | POA: Diagnosis not present

## 2021-06-18 DIAGNOSIS — Z9079 Acquired absence of other genital organ(s): Secondary | ICD-10-CM | POA: Diagnosis not present

## 2021-06-18 DIAGNOSIS — K7469 Other cirrhosis of liver: Secondary | ICD-10-CM | POA: Diagnosis present

## 2021-06-18 DIAGNOSIS — B964 Proteus (mirabilis) (morganii) as the cause of diseases classified elsewhere: Secondary | ICD-10-CM | POA: Diagnosis present

## 2021-06-18 DIAGNOSIS — I4891 Unspecified atrial fibrillation: Secondary | ICD-10-CM

## 2021-06-18 DIAGNOSIS — E860 Dehydration: Secondary | ICD-10-CM | POA: Diagnosis present

## 2021-06-18 DIAGNOSIS — K7460 Unspecified cirrhosis of liver: Secondary | ICD-10-CM

## 2021-06-18 DIAGNOSIS — I890 Lymphedema, not elsewhere classified: Secondary | ICD-10-CM | POA: Diagnosis present

## 2021-06-18 DIAGNOSIS — L03115 Cellulitis of right lower limb: Secondary | ICD-10-CM | POA: Diagnosis present

## 2021-06-18 DIAGNOSIS — N3281 Overactive bladder: Secondary | ICD-10-CM | POA: Diagnosis present

## 2021-06-18 LAB — URINALYSIS, COMPLETE (UACMP) WITH MICROSCOPIC
Bilirubin Urine: NEGATIVE
Glucose, UA: NEGATIVE mg/dL
Ketones, ur: NEGATIVE mg/dL
Nitrite: POSITIVE — AB
Protein, ur: 30 mg/dL — AB
Specific Gravity, Urine: 1.021 (ref 1.005–1.030)
WBC, UA: >50 WBC/hpf — ABNORMAL HIGH (ref 0–5)
pH: 5 (ref 5.0–8.0)

## 2021-06-18 LAB — HEPATIC FUNCTION PANEL
ALT: 28 U/L (ref 0–44)
AST: 62 U/L — ABNORMAL HIGH (ref 15–41)
Albumin: 3.4 g/dL — ABNORMAL LOW (ref 3.5–5.0)
Alkaline Phosphatase: 41 U/L (ref 38–126)
Bilirubin, Direct: 0.6 mg/dL — ABNORMAL HIGH (ref 0.0–0.2)
Indirect Bilirubin: 4.2 mg/dL — ABNORMAL HIGH (ref 0.3–0.9)
Total Bilirubin: 4.8 mg/dL — ABNORMAL HIGH (ref 0.3–1.2)
Total Protein: 5.9 g/dL — ABNORMAL LOW (ref 6.5–8.1)

## 2021-06-18 LAB — RESP PANEL BY RT-PCR (FLU A&B, COVID) ARPGX2
Influenza A by PCR: NEGATIVE
Influenza B by PCR: NEGATIVE
SARS Coronavirus 2 by RT PCR: NEGATIVE

## 2021-06-18 LAB — BRAIN NATRIURETIC PEPTIDE: B Natriuretic Peptide: 300.5 pg/mL — ABNORMAL HIGH (ref 0.0–100.0)

## 2021-06-18 LAB — MAGNESIUM: Magnesium: 1.9 mg/dL (ref 1.7–2.4)

## 2021-06-18 LAB — PROTIME-INR
INR: 1.4 — ABNORMAL HIGH (ref 0.8–1.2)
Prothrombin Time: 17.2 seconds — ABNORMAL HIGH (ref 11.4–15.2)

## 2021-06-18 LAB — APTT: aPTT: 40 seconds — ABNORMAL HIGH (ref 24–36)

## 2021-06-18 LAB — URIC ACID: Uric Acid, Serum: 4.1 mg/dL (ref 3.7–8.6)

## 2021-06-18 LAB — LACTIC ACID, PLASMA: Lactic Acid, Venous: 1.9 mmol/L (ref 0.5–1.9)

## 2021-06-18 MED ORDER — ASPIRIN EC 81 MG PO TBEC
81.0000 mg | DELAYED_RELEASE_TABLET | Freq: Every day | ORAL | Status: DC
  Administered 2021-06-18 – 2021-06-22 (×5): 81 mg via ORAL
  Filled 2021-06-18 (×5): qty 1

## 2021-06-18 MED ORDER — SODIUM CHLORIDE 0.9 % IV SOLN
1.0000 g | Freq: Once | INTRAVENOUS | Status: AC
  Administered 2021-06-18: 1 g via INTRAVENOUS
  Filled 2021-06-18: qty 10

## 2021-06-18 MED ORDER — GADOBUTROL 1 MMOL/ML IV SOLN
10.0000 mL | Freq: Once | INTRAVENOUS | Status: AC | PRN
  Administered 2021-06-18: 10 mL via INTRAVENOUS

## 2021-06-18 MED ORDER — METOPROLOL SUCCINATE ER 25 MG PO TB24
25.0000 mg | ORAL_TABLET | Freq: Every day | ORAL | Status: DC
  Administered 2021-06-18 – 2021-06-22 (×5): 25 mg via ORAL
  Filled 2021-06-18 (×5): qty 1

## 2021-06-18 MED ORDER — POTASSIUM CHLORIDE CRYS ER 20 MEQ PO TBCR
40.0000 meq | EXTENDED_RELEASE_TABLET | Freq: Two times a day (BID) | ORAL | Status: AC
  Administered 2021-06-18: 40 meq via ORAL
  Filled 2021-06-18: qty 2

## 2021-06-18 MED ORDER — ENZALUTAMIDE 40 MG PO TABS
160.0000 mg | ORAL_TABLET | Freq: Every day | ORAL | Status: DC
  Filled 2021-06-18: qty 4

## 2021-06-18 MED ORDER — FUROSEMIDE 20 MG PO TABS
20.0000 mg | ORAL_TABLET | Freq: Every day | ORAL | Status: DC
  Administered 2021-06-18 – 2021-06-22 (×5): 20 mg via ORAL
  Filled 2021-06-18 (×5): qty 1

## 2021-06-18 MED ORDER — TAMSULOSIN HCL 0.4 MG PO CAPS
0.4000 mg | ORAL_CAPSULE | Freq: Two times a day (BID) | ORAL | Status: DC
  Administered 2021-06-18 – 2021-06-22 (×8): 0.4 mg via ORAL
  Filled 2021-06-18 (×8): qty 1

## 2021-06-18 MED ORDER — PANTOPRAZOLE SODIUM 40 MG IV SOLR
40.0000 mg | INTRAVENOUS | Status: DC
  Administered 2021-06-18: 40 mg via INTRAVENOUS
  Filled 2021-06-18: qty 40

## 2021-06-18 MED ORDER — SODIUM CHLORIDE 0.9 % IV SOLN
1000.0000 mg | Freq: Once | INTRAVENOUS | Status: DC
  Filled 2021-06-18: qty 16

## 2021-06-18 MED ORDER — MIRABEGRON ER 50 MG PO TB24
50.0000 mg | ORAL_TABLET | Freq: Every day | ORAL | Status: DC
  Administered 2021-06-18 – 2021-06-22 (×5): 50 mg via ORAL
  Filled 2021-06-18 (×5): qty 1

## 2021-06-18 MED ORDER — SODIUM CHLORIDE 0.9 % IV SOLN
1000.0000 mg | Freq: Once | INTRAVENOUS | Status: AC
  Administered 2021-06-18: 1000 mg via INTRAVENOUS
  Filled 2021-06-18: qty 16

## 2021-06-18 MED ORDER — IOHEXOL 300 MG/ML  SOLN
100.0000 mL | Freq: Once | INTRAMUSCULAR | Status: AC | PRN
  Administered 2021-06-18: 100 mL via INTRAVENOUS

## 2021-06-18 MED ORDER — ALPRAZOLAM 0.25 MG PO TABS
0.2500 mg | ORAL_TABLET | Freq: Every day | ORAL | Status: DC | PRN
  Administered 2021-06-19 – 2021-06-21 (×2): 0.25 mg via ORAL
  Filled 2021-06-18 (×2): qty 1

## 2021-06-18 MED ORDER — LORAZEPAM 0.5 MG PO TABS: 0.5000 mg | ORAL_TABLET | Freq: Once | ORAL | Status: DC | PRN

## 2021-06-18 MED ORDER — CALCIUM CARBONATE ANTACID 500 MG PO CHEW
1.0000 | CHEWABLE_TABLET | Freq: Every day | ORAL | Status: DC
  Administered 2021-06-18 – 2021-06-22 (×5): 200 mg via ORAL
  Filled 2021-06-18 (×5): qty 1

## 2021-06-18 MED ORDER — LORAZEPAM 2 MG/ML IJ SOLN: 1.0000 mg | Freq: Once | INTRAMUSCULAR | Status: DC | PRN

## 2021-06-18 NOTE — ED Notes (Signed)
Patient transported to MRI 

## 2021-06-18 NOTE — Progress Notes (Signed)
Patient arrived to the unit in stable condition from ER

## 2021-06-18 NOTE — ED Notes (Signed)
Ultrasound at bedside

## 2021-06-18 NOTE — Evaluation (Signed)
Occupational Therapy Evaluation Patient Details Name: Earl Ramirez MRN: [id number] DOB: March 19, 1941 Today's Date: 06/18/2021   History of Present Illness Earl Ramirez is a 80 y.o. male with a PMH significant for stage IVB prostate cancer, history of bladder outlet obstruction s/p TURP, s/p aortic valve replacement, arthritis, A. fib, COPD, HTN, GERD, neuropathy, sleep apnea, thrombocytopenia. He presented from home to the ED on 06/18/2021 with bilateral lower extremity weakness x 2 days.   Clinical Impression   Earl Ramirez presents with generalized weakness, limited ROM, reduced endurance, and 8/10 bilateral LE pain. He has lived alone in a single story house, 8 STE, using a SPC and RW for ambulation, having groceries delivered, receiving PRN assistance from a brother and sister. During today's evaluation, Earl Ramirez has limited ROM in R knee, 0 ROM in L knee and b/l ankles, impaired sensation in b/l LE, more pronounced on L. He endorses 8/10 pain with slightest movement of lower extremities. He is able to move UEs across body to grab contralateral bed rails but cannot use this motion to roll in bed. He requires Min A support for elevating head for self-feeding. Earl Ramirez will benefit from ongoing OT while hospitalized, with DC to SNF. Pt himself states that he understands he will no longer be able to live alone, voices that it has been very difficult for him to try to meet all his own needs and states that, prior to coming to ED, he was unable to get out a chair at home for 2+ days. Anticipate that pt will need a WC for future mobility.      Recommendations for follow up therapy are one component of a multi-disciplinary discharge planning process, led by the attending physician.  Recommendations may be updated based on patient status, additional functional criteria and insurance authorization.   Follow Up Recommendations  Skilled nursing-short term rehab (<3 hours/day)    Assistance Recommended  at Discharge Frequent or constant Supervision/Assistance  Functional Status Assessment  Patient has had a recent decline in their functional status and/or demonstrates limited ability to make significant improvements in function in a reasonable and predictable amount of time  Equipment Recommendations  Wheelchair (measurements OT)    Recommendations for Other Services       Precautions / Restrictions Precautions Precautions: Fall Restrictions Weight Bearing Restrictions: No      Mobility Bed Mobility Overal bed mobility: Needs Assistance Bed Mobility: Rolling Rolling: Mod assist         General bed mobility comments: unable to move supine<sit, 2/2 "excruciating" pain in b/l LEs    Transfers Overall transfer level: Needs assistance                 General transfer comment: unable      Balance Overall balance assessment: Needs assistance   Sitting balance-Leahy Scale: Zero       Standing balance-Leahy Scale: Zero                             ADL either performed or assessed with clinical judgement   ADL Overall ADL's : Needs assistance/impaired Eating/Feeding: Minimal assistance Eating/Feeding Details (indicate cue type and reason): requires Min A for drinking from straw, with assistance for sitting up and for positioning straw in mouth  General ADL Comments: Min A for UB ADLs at bed level; Max-Total A for LB ADLs     Vision         Perception     Praxis      Pertinent Vitals/Pain Pain Assessment: 0-10 Pain Score: 8  Pain Location: b/l LE Pain Descriptors / Indicators: Burning;Aching;Heaviness;Sharp;Grimacing Pain Intervention(s): Monitored during session;Limited activity within patient's tolerance     Hand Dominance     Extremity/Trunk Assessment Upper Extremity Assessment Upper Extremity Assessment: Overall WFL for tasks assessed   Lower Extremity Assessment Lower Extremity  Assessment: Generalized weakness;RLE deficits/detail;LLE deficits/detail RLE Deficits / Details: ~ 15 degree flexion at R knee, 0 at ankle RLE: Unable to fully assess due to pain RLE Coordination: decreased fine motor;decreased gross motor LLE Deficits / Details: unable to flex/extend L knee or ankle; unable to discern touch to big toe LLE: Unable to fully assess due to pain LLE Sensation: decreased light touch LLE Coordination: decreased gross motor;decreased fine motor       Communication Communication Communication: No difficulties   Cognition Arousal/Alertness: Awake/alert Behavior During Therapy: WFL for tasks assessed/performed Overall Cognitive Status: Within Functional Limits for tasks assessed                                       General Comments  b/l LE flaking, oozing, swollen    Exercises Other Exercises Other Exercises: Educ re: role of OT, POC, impt of movement, pain mgmt strategies   Shoulder Instructions      Home Living Family/patient expects to be discharged to:: Private residence Living Arrangements: Alone Available Help at Discharge: Family;Available PRN/intermittently Type of Home: House Home Access: Stairs to enter [employer] of Steps: 2+6   Home Layout: One level     Bathroom Shower/Tub: Occupational psychologist: Handicapped height     Home Equipment: Shower seat;Grab bars - tub/shower;Rolling Environmental consultant (2 wheels);Cane - single point          Prior Functioning/Environment Prior Level of Function : Needs assist             Mobility Comments: Pt reports ambulating with SPC and RW ADLs Comments: Pt uses AE for LB dressing; occassionally takes showers but frequent relies on sponge baths; has groceries delivered; stopped driving ~ 2 months ago        OT Problem List: Decreased strength;Decreased knowledge of use of DME or AE;Increased edema;Decreased range of motion;Decreased coordination;Decreased  activity tolerance;Impaired balance (sitting and/or standing);Pain      OT Treatment/Interventions: Self-care/ADL training;Therapeutic exercise;Patient/family education;Balance training;Energy conservation;Therapeutic activities;DME and/or AE instruction    OT Goals(Current goals can be found in the care plan section) Acute Rehab OT Goals Patient Stated Goal: to not have to live alone OT Goal Formulation: With patient Time For Goal Achievement: 07/02/21 Potential to Achieve Goals: Good ADL Goals Pt Will Perform Lower Body Dressing: with modified independence;with adaptive equipment;sitting/lateral leans Pt Will Transfer to Toilet: with modified independence;stand pivot transfer (using LRAD) Additional ADL Goal #1: Pt will identify/demonstrate 2+ falls prevention techniques  OT Frequency: Min 2X/week   Barriers to D/C: Decreased caregiver support          Co-evaluation              AM-PAC OT "6 Clicks" Daily Activity     Outcome Measure Help from another person eating meals?: A Little Help from another person taking  care of personal grooming?: A Little Help from another person toileting, which includes using toliet, bedpan, or urinal?: A Lot Help from another person bathing (including washing, rinsing, drying)?: A Lot Help from another person to put on and taking off regular upper body clothing?: A Lot Help from another person to put on and taking off regular lower body clothing?: Total 6 Click Score: 13   End of Session    Activity Tolerance: Patient limited by pain Patient left: in bed;with call bell/phone within reach  OT Visit Diagnosis: Unsteadiness on feet (R26.81);Other abnormalities of gait and mobility (R26.89);Muscle weakness (generalized) (M62.81)                Time: 1459-1530 OT Time Calculation (min): 31 min Charges:  OT General Charges $OT Visit: 1 Visit OT Evaluation $OT Eval Moderate Complexity: 1 Mod OT Treatments $Self Care/Home Management : 23-37  mins Josiah Lobo, PhD, MS, OTR/L 06/18/21, 4:05 PM

## 2021-06-18 NOTE — ED Notes (Signed)
Pt to MR

## 2021-06-18 NOTE — Progress Notes (Signed)
PT Cancellation Note  Patient Details Name: TERRACE FONTANILLA MRN: [id number] DOB: 1941-06-03   Cancelled Treatment:    Reason Eval/Treat Not Completed: Other (comment) PT orders received, chart reviewed. Pt received in bed, declining participation in therapy citing BLE pain. Pt continued to refuse despite multiple attempts/encouragement. Pt reports he will participate tomorrow after he gets some sleep. PT to f/u as able.  Lavone Nian, PT, DPT 06/18/21, 3:53 PM    Waunita Schooner 06/18/2021, 3:52 PM

## 2021-06-18 NOTE — ED Notes (Signed)
This RN found pt halfway down the bed attempting to use the toilet. Pt repositioned in bed and educated that they cant get out of bed. Pt was given bedpan.

## 2021-06-18 NOTE — Progress Notes (Signed)
Patient gave permission to inform his brother Arlana Pouch of his password [phone].

## 2021-06-18 NOTE — Progress Notes (Signed)
CODE SEPSIS - PHARMACY COMMUNICATION  **Broad Spectrum Antibiotics should be administered within 1 hour of Sepsis diagnosis**  Time Code Sepsis Called/Page Received: 11/14 0745  Antibiotics Ordered: Ceftriaxone  Time of 1st antibiotic administration: 0845  Additional action taken by pharmacy: messaged RN  If necessary, Name of Provider/Nurse Contacted: Ranelle Oyster ,PharmD Clinical Pharmacist  06/18/2021  9:18 AM

## 2021-06-18 NOTE — ED Provider Notes (Signed)
Emergency Medicine Provider Triage Evaluation Note  Earl Ramirez , a 80 y.o. male  was evaluated in triage.  Pt complains of generalized weakness but specifically acute weakness in both of his legs that started 1 to 2 days ago.  Initially said it started yesterday but then he said he has not been able to get out of bed for 2 days and he has been urinating on himself.  Similar symptoms 6 months ago, improved slightly, but acutely worse about 2 days ago.  Feels generally worn out and no energy.  Patient undergoing oral treatment for prostate cancer.  Dr. [name] is his oncologist.  Review of Systems  Positive: Weakness in his extremities, particularly bilateral lower extremities.  General malaise. Negative: Chest pain, abdominal pain, nausea, vomiting, fever.  Physical Exam  BP (!) 129/57 (BP Location: Left Arm)   Pulse 91   Temp 97.7 F (36.5 C) (Oral)   Resp 16   Ht 1.702 m (5\' 7" )   Wt 108 kg   SpO2 97%   BMI 37.28 kg/m  Gen:   Awake, alert. Resp:  Slightly increased respiratory effort but the patient says this is normal for him. MSK/Neuro:  No gross deformity of the extremities.  Minimal strength in bilateral lower extremities, difficult to appreciate whether this is due to effort or ability to move his legs.  Decreased grip strength in bilateral upper extremities, but again it is difficult to appreciate if this is effort based.   Other:  No abdominal tenderness to palpation.  Medical Decision Making  Medically screening exam initiated at 4:51 AM.  Appropriate orders placed.  Earl Ramirez was informed that the remainder of the evaluation will be completed by another provider, this initial triage assessment does not replace that evaluation, and the importance of remaining in the ED until their evaluation is complete.  Patient has extensive past medical history with questionable subacute neurological deficits.  Not a candidate for tPA given that the symptoms been present for at least  2 days.  Patient is not taking care of himself, states he is unable to get out of bed.  No acute findings on labs other than mildly elevated but stable high-sensitivity troponin and very mild leukopenia.  Urinalysis ordered, MR brain ordered.   Earl Kehr, MD 06/18/21 [phone]

## 2021-06-18 NOTE — ED Provider Notes (Signed)
[facility] Emergency Department Provider Note  ____________________________________________   Event Date/Time   First MD Initiated Contact with Patient 06/18/21 [phone]     (approximate)  I have reviewed the triage vital signs and the nursing notes.   HISTORY  Chief Complaint Weakness   HPI KINGSTYN DERUITER is a 80 y.o. male with metastatic castration resistant prostate cancer, aortic valve replacement, A. fib not on anticoagulation due to preference and chronic thrombocytopenia, HTN, OSA, and COPD who presents for assessment of some acute lower extremity weakness that he states has been ongoing of the last 2 or 3 days.  It seems in the setting of some chronic lower extremity weakness and back pain.  Patient states that he has not been on his recliner in 2 or 3 days.  States he lives at home alone.  He states he fell 2 weeks ago but otherwise denies any falls or injuries and after that fall was able to ambulate and does not think he hit his head or back and since then has not had any incontinence.  He states he does not have any significant back pain at this time only soreness in both legs.  He states his legs have been sore for at least several weeks although may be a little bit worse today.  He denies any headache, earache, sore throat, fevers, chest pain, cough, vomiting, diarrhea, burning with urination or other acute sick symptoms.  No other acute concerns at this time.         Past Medical History:  Diagnosis Date   Aortic valve replaced    1998   Arthritis    Atrial fibrillation (HCC)    COPD (chronic obstructive pulmonary disease) (HCC)    Dysrhythmia    atrial fib   Hernia of abdominal wall    Hypertension    Indigestion    Neuropathy    Neuropathy    Prostate cancer ([place])    Sleep apnea    Thrombocytopenia (HCC)    Tremors of nervous system    Urinary problem     Patient Active Problem List   Diagnosis Date Noted   Vitamin D deficiency  03/27/2020   Chronic venous insufficiency 03/23/2020   PAD (peripheral artery disease) ([place]) 03/23/2020   Lymphedema 03/23/2020   COPD (chronic obstructive pulmonary disease) ([place]) 12/17/2019   Idiopathic peripheral neuropathy 12/17/2019   Sleep apnea 12/17/2019   Splenomegaly 12/17/2019   Aortic valve disease 06/08/2018   Bilateral leg edema 03/19/2017   Bilateral chronic knee pain 02/12/2016   Bilateral leg pain 02/12/2016   Incomplete emptying of bladder 04/07/2015   Chronic atrial flutter ([place]) 01/30/2015   SOB (shortness of breath) 12/07/2014   Thrombocytopenia ([place]) 12/02/2014   Prostate cancer ([place]) 11/22/2014   Pyuria 10/06/2014   Gross hematuria 10/04/2014   Urinary retention 10/04/2014    Past Surgical History:  Procedure Laterality Date   CALDWELL LUC     tongue      growth removed   TRANSURETHRAL RESECTION OF PROSTATE N/A 01/18/2019   Procedure: CHANNEL TRANSURETHRAL RESECTION OF THE PROSTATE (TURP);  Surgeon: [name], MD;  Location: [facility] ORS;  Service: Urology;  Laterality: N/A;   VALVE REPLACEMENT      Prior to Admission medications   Medication Sig Start Date End Date Taking? Authorizing Provider  ALPRAZolam Duanne Moron) 0.25 MG tablet Take 0.25 mg by mouth daily as needed for anxiety.     [provider]  aspirin EC 81 MG  tablet Take 81 mg by mouth daily.     [provider]  calcium carbonate (TUMS - DOSED IN MG ELEMENTAL CALCIUM) 500 MG chewable tablet Chew 1 tablet by mouth daily.     [provider]  docusate sodium (COLACE) 100 MG capsule Take 1 capsule (100 mg total) by mouth 2 (two) times daily. 01/19/19   [name], MD  enzalutamide Gillermina Phy) 40 MG tablet Take 4 tablets (160 mg total) by mouth daily. 04/30/21   Lloyd Huger, MD  furosemide (LASIX) 20 MG tablet Take 20 mg by mouth daily. 05/16/20   [provider]  gabapentin (NEURONTIN) 300 MG capsule Take 900 mg by mouth 4 (four) times daily.  09/22/19    [provider]  metoprolol succinate (TOPROL-XL) 25 MG 24 hr tablet Take 25 mg by mouth daily.  05/10/19   [provider]  mirabegron ER (MYRBETRIQ) 50 MG TB24 tablet Take 1 tablet (50 mg total) by mouth daily. 04/26/21   [name], MD  tamsulosin [facility]) 0.4 MG CAPS capsule Take 1 capsule by mouth twice daily 04/16/21   [name], MD    Allergies Oxycodone-acetaminophen, Oxycodone-acetaminophen, Amoxicillin, and Penicillins  Family History  Problem Relation Age of Onset   Heart attack Father    Alcohol abuse Father    Congestive Heart Failure Mother    Alzheimer's disease Sister     Social History Social History   Tobacco Use   Smoking status: Former    Types: Cigarettes    Quit date: 01/13/2001    Years since quitting: 20.4   Smokeless tobacco: Former    Quit date: 09/24/2000  Vaping Use   Vaping Use: Never used  Substance Use Topics   Alcohol use: Yes    Alcohol/week: 10.0 standard drinks    Types: 10 Cans of beer per week   Drug use: No    Review of Systems  Review of Systems  Constitutional:  Negative for chills and fever.  HENT:  Negative for sore throat.   Eyes:  Negative for pain.  Respiratory:  Negative for cough and stridor.   Cardiovascular:  Negative for chest pain.  Gastrointestinal:  Negative for vomiting.  Genitourinary:  Negative for dysuria.  Musculoskeletal:  Positive for myalgias (b/l lower extremities).  Skin:  Negative for rash.  Neurological:  Positive for weakness. Negative for seizures, loss of consciousness and headaches.  Psychiatric/Behavioral:  Negative for suicidal ideas.   All other systems reviewed and are negative.    ____________________________________________   PHYSICAL EXAM:  VITAL SIGNS: ED Triage Vitals  Enc Vitals Group     BP 06/17/21 1924 (!) 151/74     Pulse Rate 06/17/21 1924 (!) 102     Resp 06/17/21 1924 20     Temp 06/17/21 1924 98.4 F (36.9 C)     Temp Source 06/17/21 1924  Oral     SpO2 06/17/21 1924 96 %     Weight 06/17/21 1925 238 lb (108 kg)     Height 06/17/21 1925 5\' 7"  (1.702 m)     Head Circumference --      Peak Flow --      Pain Score 06/17/21 1949 0     Pain Loc --      Pain Edu? --      Excl. in [place]? --    Vitals:   06/18/21 0654 06/18/21 0915  BP: (!) 146/58 (!) 151/62  Pulse: (!) 101 (!) 104  Resp: 17 17  Temp:    SpO2: 97% 99%   Physical Exam Vitals and nursing note reviewed.  Constitutional:      Appearance: He is well-developed.  HENT:     Head: Normocephalic and atraumatic.     Right Ear: External ear normal.     Left Ear: External ear normal.     Nose: Nose normal.  Eyes:     Conjunctiva/sclera: Conjunctivae normal.  Cardiovascular:     Rate and Rhythm: Regular rhythm. Tachycardia present.     Heart sounds: No murmur heard. Pulmonary:     Effort: Pulmonary effort is normal. No respiratory distress.     Breath sounds: Normal breath sounds.  Abdominal:     Palpations: Abdomen is soft.     Tenderness: There is no abdominal tenderness.  Musculoskeletal:     Cervical back: Neck supple.     Right lower leg: Edema present.     Left lower leg: Edema present.  Skin:    General: Skin is warm and dry.     Capillary Refill: Capillary refill takes less than 2 seconds.  Neurological:     Mental Status: He is alert and oriented to person, place, and time.  Psychiatric:        Mood and Affect: Mood normal.    Patient patient has bilateral lower extremity erythema induration and flaking of skin.  He is mildly tender throughout both lower extremities.  1+ DP pulses bilaterally.  Patient is able to move his toes on command.  He is not able to hold his heels off the bed for greater than 3 seconds bilaterally.  Sensation is intact to light touch in all extremities.  He has full strength in his bilateral upper extremities.  Cranial nerves II through XII are grossly intact.  There is no tenderness step-offs or deformities or overlying skin  changes over the C/T/L-spine.  ____________________________________________   LABS (all labs ordered are listed, but only abnormal results are displayed)  Labs Reviewed  CBC - Abnormal; Notable for the following components:      Result Value   WBC 3.9 (*)    RBC 3.66 (*)    MCV 107.4 (*)    MCH 36.6 (*)    Platelets 41 (*)    All other components within normal limits  BASIC METABOLIC PANEL - Abnormal; Notable for the following components:   Potassium 3.4 (*)    Glucose, Bld 111 (*)    Calcium 8.2 (*)    All other components within normal limits  URINALYSIS, COMPLETE (UACMP) WITH MICROSCOPIC - Abnormal; Notable for the following components:   Color, Urine AMBER (*)    APPearance HAZY (*)    Hgb urine dipstick MODERATE (*)    Protein, ur 30 (*)    Nitrite POSITIVE (*)    Leukocytes,Ua SMALL (*)    WBC, UA >50 (*)    Bacteria, UA FEW (*)    All other components within normal limits  HEPATIC FUNCTION PANEL - Abnormal; Notable for the following components:   Total Protein 5.9 (*)    Albumin 3.4 (*)    AST 62 (*)    Total Bilirubin 4.8 (*)    Bilirubin, Direct 0.6 (*)    Indirect Bilirubin 4.2 (*)    All other components within normal limits  PROTIME-INR - Abnormal; Notable for the following components:   Prothrombin Time 17.2 (*)    INR 1.4 (*)    All other components within normal limits  APTT - Abnormal;  Notable for the following components:   aPTT 40 (*)    All other components within normal limits  TROPONIN I (HIGH SENSITIVITY) - Abnormal; Notable for the following components:   Troponin I (High Sensitivity) 37 (*)    All other components within normal limits  TROPONIN I (HIGH SENSITIVITY) - Abnormal; Notable for the following components:   Troponin I (High Sensitivity) 33 (*)    All other components within normal limits  RESP PANEL BY RT-PCR (FLU A&B, COVID) ARPGX2  URINE CULTURE  CULTURE, BLOOD (ROUTINE X 2)  CULTURE, BLOOD (ROUTINE X 2)  MAGNESIUM  LACTIC  ACID, PLASMA  LACTIC ACID, PLASMA  BRAIN NATRIURETIC PEPTIDE   ____________________________________________  EKG  ECG remarkable for sinus versus junctional rhythm versus artifact in the ST PR and ST segments with QTc interval calculated 503 without clear evidence of acute ischemia or significant arrhythmia. ____________________________________________  RADIOLOGY  ED MD interpretation: MRI brain shows no evidence of a CVA, hemorrhage, metastatic lesions, edema or other clear acute process.  Chest x-ray shows trace bilateral pleural effusions and mild pulmonary interstitial edema as well as cardiomegaly.  There is no clear focal consolidations, pneumothorax or other clear acute thoracic process  Right upper quadrant ultrasound shows evidence of cirrhosis and cholelithiasis without evidence of acute cholecystitis and normal common bile duct.  CT abdomen pelvis remarkable for cirrhosis and splenomegaly with portal hypertension and varices.  There is a 1.3 cm nodular density in the right lower lung possibly representing pneumonia versus neoplastic process and bilateral pleural effusions.  There is no evidence of cholecystitis, pyelonephritis, diverticulitis or perforated viscus.  There is a low-density lesion in the posterior midportion of kidney possibly hemorrhagic cyst versus neoplastic lesion.  No addition there is a small sclerotic density in the cortical margin of the anterior medial right acetabulum possibly metastatic lesion.  There is no other clear acute abdominal or pelvic processes noted.  CT C-spine shows some degenerative changes without any suspicious lesions or trauma.  CT T-spine shows no metastatic lesions and some sclerotic changes chronic around the right fourth lobe at the costovertebral junction and posterior seventh rib.  There is also some degenerative changes noted.  Patient is also noted to have a mildly enlarged aortic ascending aneurysm without complication.  CT  L-spine shows no evidence of metastatic disease or fracture in the L-spine.  There are some degenerative changes noted.  Official radiology report(s): CT Cervical Spine Wo Contrast  Result Date: 06/18/2021 CLINICAL DATA:  Generalized weakness, unable to get out of bed, incontinence. Patient is undergoing treatment for prostate cancer. Metastatic disease evaluation EXAM: CT CERVICAL SPINE WITHOUT CONTRAST TECHNIQUE: Multidetector CT imaging of the cervical spine was performed without intravenous contrast. Multiplanar CT image reconstructions were also generated. COMPARISON:  None. FINDINGS: Alignment: Normal. Skull base and vertebrae: Skull base alignment is maintained. Vertebral body heights are preserved. There is no evidence of acute fracture. There is no suspicious osseous lesion. Soft tissues and spinal canal: There is no definite abnormal soft tissue within the spinal canal. The paraspinal soft tissues are unremarkable. Disc levels: There is mild multilevel disc space narrowing with associated endplate irregularity and endplate osteophytes, most prominent at C6-C7. There is multilevel uncovertebral and facet arthropathy. Findings result in mild spinal canal stenosis at C6-C7 and multilevel moderate to severe neural foraminal stenosis including on the left at C2-C3, bilaterally at C3-C4, on the right at C4-C5, and bilaterally at C6-C7. Upper chest: A right pleural effusion is partially imaged. Other:  There is a peripherally calcified right thyroid nodule measuring 1.5 cm. IMPRESSION: 1. No suspicious osseous lesion identified in the cervical spine. Please also refer to the separately dictated CT thoracic and lumbar spine. 2. Multilevel degenerative changes as above. 3. Partially imaged right pleural effusion. 4. 1.5 cm right thyroid nodule. Recommend nonemergent thyroid ultrasound if not already performed. Electronically Signed   By: Valetta Mole M.D.   On: 06/18/2021 08:46   CT Thoracic Spine Wo  Contrast  Result Date: 06/18/2021 CLINICAL DATA:  Weakness, suspected metastatic disease. EXAM: CT THORACIC SPINE WITHOUT CONTRAST TECHNIQUE: Multidetector CT images of the thoracic were obtained using the standard protocol without intravenous contrast. COMPARISON:  PET-CT 01/23/2021 FINDINGS: Alignment: There is slightly exaggerated thoracic kyphosis. There is no antero or retrolisthesis. Vertebrae: Vertebral body heights are preserved. There is no definite suspicious lesion in the vertebral bodies. There is no evidence of acute fracture. There is a sclerotic lesion in the proximal right fourth rib at the costovertebral junction consistent with metastatic disease as seen on the prior PET-CT (4-49). There is a sclerotic lesion in the posterior right seventh rib, also seen on the prior PET (4-96). Paraspinal and other soft tissues: The paraspinal soft tissues are unremarkable. There is no definite abnormal soft tissue within the canal, though this is suboptimally evaluated by CT. Disc levels: There is multilevel degenerative endplate change and mild facet arthropathy throughout the thoracic spine. The osseous spinal canal and neural foramina are patent. Other: There is a moderate right pleural effusion. The ascending thoracic aorta appears mildly ectatic measuring up to 4.3 cm. There is extensive calcified atherosclerotic plaque throughout the thoracic aorta. There are scattered prominent mediastinal lymph nodes, grossly similar to the prior PET-CT. IMPRESSION: 1. No definite evidence of osseous metastatic disease in the thoracic spine. 2. Unchanged sclerotic lesions in the right fourth rib at the costovertebral junction and posterior seventh rib compared to the prior PET-CT from 01/23/2021 3. Mild multilevel degenerative change. The osseous spinal canal and neural foramina are patent. 4. Moderate right pleural effusion. 5. Mildly enlarged ascending thoracic aorta measuring up to 4.3 cm Recommend annual imaging  followup by CTA or MRA. This recommendation follows 2010 ACCF/AHA/AATS/ACR/ASA/SCA/SCAI/SIR/STS/SVM Guidelines for the Diagnosis and Management of Patients with Thoracic Aortic Disease. Circulation. 2010; 121: H545-G256. Aortic aneurysm NOS (ICD10-I71.9) Electronically Signed   By: Valetta Mole M.D.   On: 06/18/2021 08:57   MR Brain W and Wo Contrast  Result Date: 06/18/2021 CLINICAL DATA:  Brain mass or lesion. EXAM: MRI HEAD WITHOUT AND WITH CONTRAST TECHNIQUE: Multiplanar, multiecho pulse sequences of the brain and surrounding structures were obtained without and with intravenous contrast. CONTRAST:  72mL GADAVIST GADOBUTROL 1 MMOL/ML IV SOLN COMPARISON:  None similar FINDINGS: Brain: No acute infarction, hemorrhage, hydrocephalus, extra-axial collection or mass lesion. Preserved brain volume. Mild chronic ischemic gliosis in the deep white matter. Few remote microhemorrhages asymmetric to the left hemisphere, possibly from prior trauma. Vascular: None normal flow voids and vascular enhancement Skull and upper cervical spine: Mild heterogeneity of marrow the skull base and upper cervical spine without discrete lesion. Sinuses/Orbits: Negative IMPRESSION: Age normal brain MRI Electronically Signed   By: Jorje Guild M.D.   On: 06/18/2021 07:02   CT ABDOMEN PELVIS W CONTRAST  Addendum Date: 06/18/2021   ADDENDUM REPORT: 06/18/2021 09:26 ADDENDUM: There is small sclerotic density without break in the cortical margins in the anterior medial aspect of right acetabulum suggesting sclerotic metastatic disease. Electronically Signed   By: Royston Cowper  Rathinasamy M.D.   On: 06/18/2021 09:26   Result Date: 06/18/2021 CLINICAL DATA:  Metastatic disease evaluation, weakness in the both lower extremities EXAM: CT ABDOMEN AND PELVIS WITH CONTRAST TECHNIQUE: Multidetector CT imaging of the abdomen and pelvis was performed using the standard protocol following bolus administration of intravenous contrast. CONTRAST:   146mL OMNIPAQUE IOHEXOL 300 MG/ML  SOLN COMPARISON:  PET-CT done on 01/23/2021 FINDINGS: Lower chest: There is 1.3 cm nodular density in the right lower lobe. There are patchy infiltrates in the lower lung fields, more so in the right lower lobe. Small right pleural effusion is seen. There is minimal left pleural effusion. There are ectatic vessels at the gastroesophageal junction suggesting varices related to portal hypertension. Hepatobiliary: Liver measures 12.6 cm in length. There is nodularity in the liver surface suggesting cirrhosis. There is no dilation of bile ducts. Gallbladder stones are seen. Pancreas: No focal abnormality is seen. Spleen: Spleen is enlarged measuring 17.6 cm in length. There is 1.3 cm low-density in the spleen. This may suggest incidental benign process such as cyst or hemangioma. Less likely possibility would be neoplastic process. Adrenals/Urinary Tract: Adrenals are unremarkable. There is no hydronephrosis. There is 2.1 cm cyst in the anterior midportion of left kidney. There is 2 cm ill-defined low-density in the posterior midportion of left kidney with density measurements higher than usual for simple cyst. There are no definite renal or ureteral stones. In image 26 of series 2, there is questionable subtle increased density in the central portion of the right kidney. Urinary bladder is unremarkable. Stomach/Bowel: Small hiatal hernia is seen. Diverticulum is noted along the inner margin of duodenum. Small bowel loops are not dilated. Appendix is not distinctly seen. There is no significant wall thickening in colon. Scattered diverticula are seen. Vascular/Lymphatic: There are subcentimeter nodes in the retroperitoneum with no significant interval change. Reproductive: There is contrast in the lumen of prostatic urethra, possibly suggesting previous intervention. Prostate is not enlarged. Other: There is no ascites or pneumoperitoneum. Musculoskeletal: No definite focal lytic or  sclerotic lesions are seen. IMPRESSION: Cirrhosis. Splenomegaly. 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 There is 1.3 cm nodular density in the right lower lung fields which may suggest focal pneumonia or neoplastic process. Bilateral pleural effusions are seen, more so on the right side. There is no evidence of intestinal obstruction or pneumoperitoneum. There is no hydronephrosis. There is a 2 cm low-density lesion with indistinct margins and higher than usual density measurement for simple cyst in the posterior midportion of left kidney. This may suggest hemorrhagic cyst or neoplastic process. There are no focal lytic or sclerotic lesions in the bony structures. No new significant lymphadenopathy seen. Gallbladder stones.  There is no dilation of bile ducts. Other findings as described in the body of the report. Electronically Signed: By: Elmer Picker M.D. On: 06/18/2021 09:06   DG Chest Portable 1 View  Result Date: 06/18/2021 CLINICAL DATA:  Weakness in both legs EXAM: PORTABLE CHEST 1 VIEW COMPARISON:  PET-CT 01/23/2021, CT chest 11/30/2018 FINDINGS: Median sternotomy wires are again seen. The first and third wires are fractured, unchanged. The heart is enlarged. There is calcified atherosclerotic plaque of the aortic arch. The mediastinum is prominent, likely at least in part due to dilation of the ascending thoracic aorta as seen on prior cross-sectional imaging. There is pulmonary vascular congestion and possible mild pulmonary interstitial edema. There is a trace right pleural effusion. There is no left effusion. There  is no focal consolidation. There is no acute osseous abnormality. IMPRESSION: 1. Trace right pleural effusion and possible mild pulmonary interstitial edema. 2. Cardiomegaly. 3. Prominent mediastinal contours likely at least in part due to ascending thoracic aortic dilation as seen on prior cross-sectional imaging.  Electronically Signed   By: Valetta Mole M.D.   On: 06/18/2021 08:07   US ABDOMEN LIMITED RUQ (LIVER/GB)  Result Date: 06/18/2021 CLINICAL DATA:  Hyperbilirubinemia EXAM: ULTRASOUND ABDOMEN LIMITED RIGHT UPPER QUADRANT COMPARISON:  10/18/2018 FINDINGS: Gallbladder: Chronic cholelithiasis. Full gallbladder but no wall thickening or focal tenderness. The largest stone measures nearly 3 cm. Common bile duct: Diameter: 3 mm Liver: Heterogeneous liver correlating with history of cirrhosis. No focal lesion identified. Portal vein is patent on color Doppler imaging with normal direction of blood flow towards the liver. Other: Trace ascites around the liver. IMPRESSION: Cirrhosis and cholelithiasis, also seen in 2020 by MRI. No acute finding. Electronically Signed   By: Jorje Guild M.D.   On: 06/18/2021 08:22    ____________________________________________   PROCEDURES  Procedure(s) performed (including Critical Care):  Procedures   ____________________________________________   INITIAL IMPRESSION / ASSESSMENT AND PLAN / ED COURSE        Patient presents with above-stated history exam for assessment of some acute on chronic lower extremity weakness now associated with some redness pain and swelling in both legs.  Patient notes she fell 2 weeks ago but has not fallen otherwise and denies any other pain other than some soreness in his legs.  On arrival he is tachycardic and hypertensive with otherwise stable vital signs on room air.  He does seem quite weak in his extremities on both sides in the legs but otherwise has full strength in his upper extremities and is alert and oriented with no other focal deficits.  On his back is fairly unremarkable given patient's age and history of cancer and recent fall differential considerations include possible pathologic fracture versus spinal mets, sepsis or cystitis which is a cellulitis in the lower extremities, metabolic derangements, arrhythmia, ACS,  possible CVA or metastatic brain lesion.  ECG and minimally elevated troponins at 37 and 33 are not suggestive of ACS or myocarditis but some mild demand ischemia.  Chest x-ray shows evidence of possibly very mild CHF but no pneumonia rib fractures or other acute thoracic process.  BMP shows K3.4 without any other significant electrolyte metabolic derangements.  CBC shows no leukocytosis or acute anemia but WBC count is slightly low at 3.9.  UA is concerning for cystitis as it is positive for nitrites with small leuks esterase and creatinine 50 WBCs.  Urine and blood cultures ordered.  COVID and flu is negative.  Magnesium is unremarkable.  Hepatic function panel markable for a T bili of 4.8 fairly elevated.  No follows up with a right upper quadrant ultrasound although patient has no GI symptoms right upper quadrant abdominal pain.  MRI brain shows no evidence of a CVA, hemorrhage, metastatic lesions, edema or other clear acute process.  Chest x-ray shows trace bilateral pleural effusions and mild pulmonary interstitial edema as well as cardiomegaly.  There is no clear focal consolidations, pneumothorax or other clear acute thoracic process  Right upper quadrant ultrasound shows evidence of cirrhosis and cholelithiasis without evidence of acute cholecystitis and normal common bile duct.  CT abdomen pelvis remarkable for cirrhosis and splenomegaly with portal hypertension and varices.  There is a 1.3 cm nodular density in the right lower lung possibly representing pneumonia versus neoplastic  process and bilateral pleural effusions.  There is no evidence of cholecystitis, pyelonephritis, diverticulitis or perforated viscus.  There is a low-density lesion in the posterior midportion of kidney possibly hemorrhagic cyst versus neoplastic lesion.  No addition there is a small sclerotic density in the cortical margin of the anterior medial right acetabulum possibly metastatic lesion.  There is no other clear  acute abdominal or pelvic processes noted.  CT C-spine shows some degenerative changes without any suspicious lesions or trauma.  CT T-spine shows no metastatic lesions and some sclerotic changes chronic around the right fourth lobe at the costovertebral junction and posterior seventh rib.  There is also some degenerative changes noted.  Patient is also noted to have a mildly enlarged aortic ascending aneurysm without complication.  CT L-spine shows no evidence of metastatic disease or fracture in the L-spine.  There are some degenerative changes noted.  This time and I believe patient is septic although we will start Rocephin for cystitis and nonpurulent possible bilateral lower extremity cellulitis.  I will admit to medicine service for further evaluation and management.     ____________________________________________   FINAL CLINICAL IMPRESSION(S) / ED DIAGNOSES  Final diagnoses:  Bilirubinemia  Weakness  Acute cystitis with hematuria  Portal hypertension (HCC)  Metastatic malignant neoplasm, unspecified site ([place])  Cellulitis, unspecified cellulitis site    Medications  LORazepam (ATIVAN) tablet 0.5 mg (has no administration in time range)  LORazepam (ATIVAN) injection 1 mg (has no administration in time range)  gadobutrol (GADAVIST) 1 MMOL/ML injection 10 mL (10 mLs Intravenous Contrast Given 06/18/21 0624)  cefTRIAXone (ROCEPHIN) 1 g in sodium chloride 0.9 % 100 mL IVPB (1 g Intravenous New Bag/Given 06/18/21 0845)  iohexol (OMNIPAQUE) 300 MG/ML solution 100 mL (100 mLs Intravenous Contrast Given 06/18/21 0816)     ED Discharge Orders     None        Note:  This document was prepared using Dragon voice recognition software and may include unintentional dictation errors.    Lucrezia Starch, MD 06/18/21 1004

## 2021-06-18 NOTE — Progress Notes (Signed)
Went to pt room to take to MRI and pt refused at this time, RN and MD notified

## 2021-06-18 NOTE — H&P (Addendum)
History and Physical    Earl Ramirez YPP:[id number] DOB: Dec 26, 1940 DOA: 06/18/2021 PCP: Baxter Hire, MD  Chief Complaint: bilateral lower extremity weakness Historian: patient HPI:  Earl Ramirez is a 80 y.o. male with a PMH significant for stage IVB prostate cancer, history of bladder outlet obstruction s/p TURP, s/p aortic valve replacement, arthritis, A. fib, COPD, HTN, GERD, neuropathy, sleep apnea, thrombocytopenia. They presented from home to the ED on 06/18/2021 with bilateral lower extremity weakness x 2 days.  At baseline, patient was able to ambulate and perform all of ADLs independently.  Last saw oncology 11/1, Dr. [name].  Has history of metastatic disease seen 01/2021.  Undergoing treatment with Xgeva.  Received Eligard 04/2021. In the ED, it appears that they initially worked him up for stroke workup which was negative. it was found that they had bacteriuria.  They were treated with CTX and IV fluids.  Patient was admitted to medicine service for further workup and management of weakness as outlined in detail below. Patient states that he otherwise feels well but because he was not able to get out of his chair any longer the past 2 days, he was scared and came to ED. His legs have abnormal sensation and feel heavy. He endorses incontinence of bowel and bladder since onset.   Review of Systems: As per HPI otherwise 10 point review of systems negative.   Past Medical History:  Diagnosis Date   Aortic valve replaced    1998   Arthritis    Atrial fibrillation (HCC)    COPD (chronic obstructive pulmonary disease) (HCC)    Dysrhythmia    atrial fib   Hernia of abdominal wall    Hypertension    Indigestion    Neuropathy    Neuropathy    Prostate cancer ([place])    Sleep apnea    Thrombocytopenia (HCC)    Tremors of nervous system    Urinary problem     Past Surgical History:  Procedure Laterality Date   CALDWELL LUC     tongue      growth removed    TRANSURETHRAL RESECTION OF PROSTATE N/A 01/18/2019   Procedure: CHANNEL TRANSURETHRAL RESECTION OF THE PROSTATE (TURP);  Surgeon: [name], MD;  Location: [facility] ORS;  Service: Urology;  Laterality: N/A;   VALVE REPLACEMENT       reports that he quit smoking about 20 years ago. His smoking use included cigarettes. He quit smokeless tobacco use about 20 years ago. He reports current alcohol use of about 10.0 standard drinks per week. He reports that he does not use drugs.  Allergies  Allergen Reactions   Oxycodone-Acetaminophen Nausea Only and Shortness Of Breath   Oxycodone-Acetaminophen     Other reaction(s): Drowsy   Amoxicillin Itching    Hand itching   Penicillins Rash    Did it involve swelling of the face/tongue/throat, SOB, or low BP? No Did it involve sudden or severe rash/hives, skin peeling, or any reaction on the inside of your mouth or nose? No Did you need to seek medical attention at a hospital or doctor's office? No When did it last happen?      4-5 years ago If all above answers are "NO", may proceed with cephalosporin use.     Family History  Problem Relation Age of Onset   Heart attack Father    Alcohol abuse Father    Congestive Heart Failure Mother    Alzheimer's disease Sister     Prior to  Admission medications   Medication Sig Start Date End Date Taking? Authorizing Provider  ALPRAZolam Duanne Moron) 0.25 MG tablet Take 0.25 mg by mouth daily as needed for anxiety.     [provider]  aspirin EC 81 MG tablet Take 81 mg by mouth daily.     [provider]  calcium carbonate (TUMS - DOSED IN MG ELEMENTAL CALCIUM) 500 MG chewable tablet Chew 1 tablet by mouth daily.     [provider]  docusate sodium (COLACE) 100 MG capsule Take 1 capsule (100 mg total) by mouth 2 (two) times daily. 01/19/19   [name], MD  enzalutamide Gillermina Phy) 40 MG tablet Take 4 tablets (160 mg total) by mouth daily. 04/30/21   Lloyd Huger, MD   furosemide (LASIX) 20 MG tablet Take 20 mg by mouth daily. 05/16/20   [provider]  gabapentin (NEURONTIN) 300 MG capsule Take 900 mg by mouth 4 (four) times daily.  09/22/19   [provider]  metoprolol succinate (TOPROL-XL) 25 MG 24 hr tablet Take 25 mg by mouth daily.  05/10/19   [provider]  mirabegron ER (MYRBETRIQ) 50 MG TB24 tablet Take 1 tablet (50 mg total) by mouth daily. 04/26/21   [name], MD  tamsulosin [facility]) 0.4 MG CAPS capsule Take 1 capsule by mouth twice daily 04/16/21   [name], MD   I have personally, briefly reviewed patient's prior medical records in [place]  Physical Exam: Vitals:   06/18/21 0915 06/18/21 1030 06/18/21 1130 06/18/21 1230  BP: (!) 151/62 131/71 (!) 151/64 (!) 168/71  Pulse: (!) 104 (!) 103 (!) 104 (!) 104  Resp: 17 18 17 20   Temp:      TempSrc:      SpO2: 99% 97% 98% 98%  Weight:      Height:       General: alert, oriented, NAD.  Physical Exam Vitals and nursing note reviewed.  Constitutional:      General: He is not in acute distress.    Appearance: He is obese. He is not toxic-appearing.  HENT:     Head: Normocephalic.  Cardiovascular:     Rate and Rhythm: Normal rate and regular rhythm.     Heart sounds: No murmur heard. Pulmonary:     Effort: Pulmonary effort is normal.     Breath sounds: Normal breath sounds.  Abdominal:     Palpations: Abdomen is soft.     Tenderness: There is no abdominal tenderness.     Hernia: A hernia is present.  Musculoskeletal:     Right lower leg: Edema present.     Left lower leg: Edema present.  Skin:    General: Skin is warm and dry.     Coloration: Skin is not jaundiced.     Comments: Severe chronic LE vascular changes. Please see clinical image for further detail  Neurological:     Mental Status: He is alert and oriented to person, place, and time. Mental status is at baseline.     GCS: GCS eye subscore is 4. GCS verbal subscore is 5.  GCS motor subscore is 6.     Cranial Nerves: Cranial nerves 2-12 are intact.     Sensory: Sensation is intact.     Motor: Weakness present. No seizure activity.     Deep Tendon Reflexes: Reflexes abnormal.     Reflex Scores:      Patellar reflexes are 3+ on the right side and 2+ on the left  side.    Comments: Unable to ambulate.  Strength on bilateral feet 5/5. Decreased tibial/pedal pulse 2/2 body habitus Can rotate left leg at hip but cannot move across the bed. Can momentarily lift right leg off bed.  Painful to attempt moving legs bilaterally. Unable to attempt straight leg raise based on leg weight.        Labs on Admission: I have personally reviewed following labs and imaging studies  CBC: Recent Labs  Lab 06/17/21 1933  WBC 3.9*  HGB 13.4  HCT 39.3  MCV 107.4*  PLT 41*   Basic Metabolic Panel: Recent Labs  Lab 06/17/21 1933 06/18/21 0652  NA 139  --   K 3.4*  --   CL 108  --   CO2 24  --   GLUCOSE 111*  --   BUN 22  --   CREATININE 0.69  --   CALCIUM 8.2*  --   MG  --  1.9   GFR: Estimated Creatinine Clearance: 83.5 mL/min (by C-G formula based on SCr of 0.69 mg/dL). Liver Function Tests: Recent Labs  Lab 06/17/21 2219  AST 62*  ALT 28  ALKPHOS 41  BILITOT 4.8*  PROT 5.9*  ALBUMIN 3.4*   Coagulation Profile: Recent Labs  Lab 06/18/21 0652  INR 1.4*    Urine analysis:    Component Value Date/Time   COLORURINE AMBER (A) 06/18/2021 0545   APPEARANCEUR HAZY (A) 06/18/2021 0545   APPEARANCEUR Cloudy (A) 05/22/2021 1348   LABSPEC 1.021 06/18/2021 0545   PHURINE 5.0 06/18/2021 0545   GLUCOSEU NEGATIVE 06/18/2021 0545   HGBUR MODERATE (A) 06/18/2021 0545   BILIRUBINUR NEGATIVE 06/18/2021 0545   BILIRUBINUR Negative 05/22/2021 [place] 06/18/2021 0545   PROTEINUR 30 (A) 06/18/2021 0545   NITRITE POSITIVE (A) 06/18/2021 0545   LEUKOCYTESUR SMALL (A) 06/18/2021 0545    Radiological Exams on Admission: CT Cervical  Spine Wo Contrast  Result Date: 06/18/2021 CLINICAL DATA:  Generalized weakness, unable to get out of bed, incontinence. Patient is undergoing treatment for prostate cancer. Metastatic disease evaluation EXAM: CT CERVICAL SPINE WITHOUT CONTRAST TECHNIQUE: Multidetector CT imaging of the cervical spine was performed without intravenous contrast. Multiplanar CT image reconstructions were also generated. COMPARISON:  None. FINDINGS: Alignment: Normal. Skull base and vertebrae: Skull base alignment is maintained. Vertebral body heights are preserved. There is no evidence of acute fracture. There is no suspicious osseous lesion. Soft tissues and spinal canal: There is no definite abnormal soft tissue within the spinal canal. The paraspinal soft tissues are unremarkable. Disc levels: There is mild multilevel disc space narrowing with associated endplate irregularity and endplate osteophytes, most prominent at C6-C7. There is multilevel uncovertebral and facet arthropathy. Findings result in mild spinal canal stenosis at C6-C7 and multilevel moderate to severe neural foraminal stenosis including on the left at C2-C3, bilaterally at C3-C4, on the right at C4-C5, and bilaterally at C6-C7. Upper chest: A right pleural effusion is partially imaged. Other: There is a peripherally calcified right thyroid nodule measuring 1.5 cm. IMPRESSION: 1. No suspicious osseous lesion identified in the cervical spine. Please also refer to the separately dictated CT thoracic and lumbar spine. 2. Multilevel degenerative changes as above. 3. Partially imaged right pleural effusion. 4. 1.5 cm right thyroid nodule. Recommend nonemergent thyroid ultrasound if not already performed. Electronically Signed   By: Valetta Mole M.D.   On: 06/18/2021 08:46   CT Thoracic Spine Wo Contrast  Result Date: 06/18/2021 CLINICAL DATA:  Weakness, suspected  metastatic disease. EXAM: CT THORACIC SPINE WITHOUT CONTRAST TECHNIQUE: Multidetector CT images of  the thoracic were obtained using the standard protocol without intravenous contrast. COMPARISON:  PET-CT 01/23/2021 FINDINGS: Alignment: There is slightly exaggerated thoracic kyphosis. There is no antero or retrolisthesis. Vertebrae: Vertebral body heights are preserved. There is no definite suspicious lesion in the vertebral bodies. There is no evidence of acute fracture. There is a sclerotic lesion in the proximal right fourth rib at the costovertebral junction consistent with metastatic disease as seen on the prior PET-CT (4-49). There is a sclerotic lesion in the posterior right seventh rib, also seen on the prior PET (4-96). Paraspinal and other soft tissues: The paraspinal soft tissues are unremarkable. There is no definite abnormal soft tissue within the canal, though this is suboptimally evaluated by CT. Disc levels: There is multilevel degenerative endplate change and mild facet arthropathy throughout the thoracic spine. The osseous spinal canal and neural foramina are patent. Other: There is a moderate right pleural effusion. The ascending thoracic aorta appears mildly ectatic measuring up to 4.3 cm. There is extensive calcified atherosclerotic plaque throughout the thoracic aorta. There are scattered prominent mediastinal lymph nodes, grossly similar to the prior PET-CT. IMPRESSION: 1. No definite evidence of osseous metastatic disease in the thoracic spine. 2. Unchanged sclerotic lesions in the right fourth rib at the costovertebral junction and posterior seventh rib compared to the prior PET-CT from 01/23/2021 3. Mild multilevel degenerative change. The osseous spinal canal and neural foramina are patent. 4. Moderate right pleural effusion. 5. Mildly enlarged ascending thoracic aorta measuring up to 4.3 cm Recommend annual imaging followup by CTA or MRA. This recommendation follows 2010 ACCF/AHA/AATS/ACR/ASA/SCA/SCAI/SIR/STS/SVM Guidelines for the Diagnosis and Management of Patients with Thoracic  Aortic Disease. Circulation. 2010; 121: P591-M384. Aortic aneurysm NOS (ICD10-I71.9) Electronically Signed   By: Valetta Mole M.D.   On: 06/18/2021 08:57   MR Brain W and Wo Contrast  Result Date: 06/18/2021 CLINICAL DATA:  Brain mass or lesion. EXAM: MRI HEAD WITHOUT AND WITH CONTRAST TECHNIQUE: Multiplanar, multiecho pulse sequences of the brain and surrounding structures were obtained without and with intravenous contrast. CONTRAST:  70mL GADAVIST GADOBUTROL 1 MMOL/ML IV SOLN COMPARISON:  None similar FINDINGS: Brain: No acute infarction, hemorrhage, hydrocephalus, extra-axial collection or mass lesion. Preserved brain volume. Mild chronic ischemic gliosis in the deep white matter. Few remote microhemorrhages asymmetric to the left hemisphere, possibly from prior trauma. Vascular: None normal flow voids and vascular enhancement Skull and upper cervical spine: Mild heterogeneity of marrow the skull base and upper cervical spine without discrete lesion. Sinuses/Orbits: Negative IMPRESSION: Age normal brain MRI Electronically Signed   By: Jorje Guild M.D.   On: 06/18/2021 07:02   CT ABDOMEN PELVIS W CONTRAST  Addendum Date: 06/18/2021   ADDENDUM REPORT: 06/18/2021 09:26 ADDENDUM: There is small sclerotic density without break in the cortical margins in the anterior medial aspect of right acetabulum suggesting sclerotic metastatic disease. Electronically Signed   By: Elmer Picker M.D.   On: 06/18/2021 09:26   Result Date: 06/18/2021 CLINICAL DATA:  Metastatic disease evaluation, weakness in the both lower extremities EXAM: CT ABDOMEN AND PELVIS WITH CONTRAST TECHNIQUE: Multidetector CT imaging of the abdomen and pelvis was performed using the standard protocol following bolus administration of intravenous contrast. CONTRAST:  165mL OMNIPAQUE IOHEXOL 300 MG/ML  SOLN COMPARISON:  PET-CT done on 01/23/2021 FINDINGS: Lower chest: There is 1.3 cm nodular density in the right lower lobe. There are  patchy infiltrates in the lower lung  fields, more so in the right lower lobe. Small right pleural effusion is seen. There is minimal left pleural effusion. There are ectatic vessels at the gastroesophageal junction suggesting varices related to portal hypertension. Hepatobiliary: Liver measures 12.6 cm in length. There is nodularity in the liver surface suggesting cirrhosis. There is no dilation of bile ducts. Gallbladder stones are seen. Pancreas: No focal abnormality is seen. Spleen: Spleen is enlarged measuring 17.6 cm in length. There is 1.3 cm low-density in the spleen. This may suggest incidental benign process such as cyst or hemangioma. Less likely possibility would be neoplastic process. Adrenals/Urinary Tract: Adrenals are unremarkable. There is no hydronephrosis. There is 2.1 cm cyst in the anterior midportion of left kidney. There is 2 cm ill-defined low-density in the posterior midportion of left kidney with density measurements higher than usual for simple cyst. There are no definite renal or ureteral stones. In image 26 of series 2, there is questionable subtle increased density in the central portion of the right kidney. Urinary bladder is unremarkable. Stomach/Bowel: Small hiatal hernia is seen. Diverticulum is noted along the inner margin of duodenum. Small bowel loops are not dilated. Appendix is not distinctly seen. There is no significant wall thickening in colon. Scattered diverticula are seen. Vascular/Lymphatic: There are subcentimeter nodes in the retroperitoneum with no significant interval change. Reproductive: There is contrast in the lumen of prostatic urethra, possibly suggesting previous intervention. Prostate is not enlarged. Other: There is no ascites or pneumoperitoneum. Musculoskeletal: No definite focal lytic or sclerotic lesions are seen. IMPRESSION: Cirrhosis. Splenomegaly. 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  There is 1.3 cm nodular density in the right lower lung fields which may suggest focal pneumonia or neoplastic process. Bilateral pleural effusions are seen, more so on the right side. There is no evidence of intestinal obstruction or pneumoperitoneum. There is no hydronephrosis. There is a 2 cm low-density lesion with indistinct margins and higher than usual density measurement for simple cyst in the posterior midportion of left kidney. This may suggest hemorrhagic cyst or neoplastic process. There are no focal lytic or sclerotic lesions in the bony structures. No new significant lymphadenopathy seen. Gallbladder stones.  There is no dilation of bile ducts. Other findings as described in the body of the report. Electronically Signed: By: Elmer Picker M.D. On: 06/18/2021 09:06   DG Chest Portable 1 View  Result Date: 06/18/2021 CLINICAL DATA:  Weakness in both legs EXAM: PORTABLE CHEST 1 VIEW COMPARISON:  PET-CT 01/23/2021, CT chest 11/30/2018 FINDINGS: Median sternotomy wires are again seen. The first and third wires are fractured, unchanged. The heart is enlarged. There is calcified atherosclerotic plaque of the aortic arch. The mediastinum is prominent, likely at least in part due to dilation of the ascending thoracic aorta as seen on prior cross-sectional imaging. There is pulmonary vascular congestion and possible mild pulmonary interstitial edema. There is a trace right pleural effusion. There is no left effusion. There is no focal consolidation. There is no acute osseous abnormality. IMPRESSION: 1. Trace right pleural effusion and possible mild pulmonary interstitial edema. 2. Cardiomegaly. 3. Prominent mediastinal contours likely at least in part due to ascending thoracic aortic dilation as seen on prior cross-sectional imaging. Electronically Signed   By: Valetta Mole M.D.   On: 06/18/2021 08:07   US ABDOMEN LIMITED RUQ (LIVER/GB)  Result Date: 06/18/2021 CLINICAL DATA:  Hyperbilirubinemia  EXAM: ULTRASOUND ABDOMEN LIMITED RIGHT UPPER QUADRANT COMPARISON:  10/18/2018 FINDINGS: Gallbladder: Chronic cholelithiasis. Full  gallbladder but no wall thickening or focal tenderness. The largest stone measures nearly 3 cm. Common bile duct: Diameter: 3 mm Liver: Heterogeneous liver correlating with history of cirrhosis. No focal lesion identified. Portal vein is patent on color Doppler imaging with normal direction of blood flow towards the liver. Other: Trace ascites around the liver. IMPRESSION: Cirrhosis and cholelithiasis, also seen in 2020 by MRI. No acute finding. Electronically Signed   By: Jorje Guild M.D.   On: 06/18/2021 08:22    EKG: Independently reviewed. Sinus tachycardia. Will need repeat as it was poor quality  Assessment/Plan Active Problems:   Thrombocytopenia (HCC)   Prostate cancer (HCC)   COPD (chronic obstructive pulmonary disease) (HCC)   Pyuria   Lower extremity weakness   Thyroid nodule   A-fib (HCC)   Primary hypertension   Other cirrhosis of liver (HCC)   Acute onset Bilateral LE weakness with h/o prostate cancer and evidence of metastasis. ED initiated treatment for presumed sepsis from urinary source after stroke was ruled out... I have concern for cord compression - stat lumbar MRI- patient cannot receive more contrast at this time due to policy. Can repeat with contrast as early as 5 am 11/15. - 1g solumedrol  - monitor glucose - FYI to his oncologist Dr [name] - consider neurology consult s/p MRI reading - f/u blood culture - PT/OT - unna boots  stage IVB prostate cancer- continue chemotherapy - management per oncology  Thyroid nodule- 1.5cm seen on CT - nonemergent [place] to follow up   Elevated troponins- mild on presentation without chest pain or ST changes on ECG. Trended and decreased. Likely demand ischemia in setting of dehydration.   Bacteruria- asymptomatic. Chronically has pyruia but without symptoms.  - hold the antibiotics at this  time (CTX 11/14 in ED) - recommend cystoscopy if becomes symptomatic, per urology recs - f/u urine culture  Afib-without anticoagulation - continue home rate control as described below  Mild hypokalemia- K+ 3.4 - replace PRN - CMP am  Chronic thrombocytopenia- 41 on admission. At baseline.  - CBC am - heme/onc aware  HTN- chronic, stable - continue home meds- metoprolol, lasix  OSA- chronic - CPAP nightly  COPD- ORA without respiratory distress - duoneb PRN  Cirrhosis  portal hypertension  splenomegaly  cholelithiasis- chronic, stable on imaging. No hepatitis panel in chart. AST 62, ALT 28, bilirubin 4.8, albumin 3.4, platelets 41. PT/INR 17.2/1.4. Meld Na 17 - continue lasix, consider adding spironolactone if BP can tolerate - continue BB - PPI - acute hepatitis panel am  Urinary retention/overactive bladder- chronic. Last seen by urology 10/22 - continue home myrbetriq - hold home flomax as it was not discussed in last urology visit  DVT prophylaxis: SCD given thrombocytopenia   Code Status: full  Family Communication: none  Disposition Plan: TBD  Consults called: heme/onc   Richarda Osmond, DO [facility]  06/18/2021, 1:52 PM    To contact the appropriate [facility] Attending or Consulting provider: Check the care team in [facility] for a) attending/consulting [place] provider name and b) the [facility] team listed Log into [email or web address] and use [place]'s universal password to access. If you do not have the password, please contact the hospital operator. Locate the [facility] provider you are looking for under [facility] and page to a number that you can be directly reached. (Text pages appreciated) If you still have difficulty reaching the provider, please page the [facility] (Director on Call) for the Hospitalists listed on amion for assistance.

## 2021-06-18 NOTE — ED Notes (Signed)
Report received from [place], [place]

## 2021-06-18 NOTE — ED Notes (Signed)
PT at bedside.

## 2021-06-18 NOTE — ED Notes (Signed)
Pt cleaned up. Male purewick placed

## 2021-06-19 DIAGNOSIS — M47816 Spondylosis without myelopathy or radiculopathy, lumbar region: Secondary | ICD-10-CM

## 2021-06-19 DIAGNOSIS — N3001 Acute cystitis with hematuria: Principal | ICD-10-CM

## 2021-06-19 LAB — COMPREHENSIVE METABOLIC PANEL
ALT: 30 U/L (ref 0–44)
AST: 55 U/L — ABNORMAL HIGH (ref 15–41)
Albumin: 2.8 g/dL — ABNORMAL LOW (ref 3.5–5.0)
Alkaline Phosphatase: 39 U/L (ref 38–126)
Anion gap: 7 (ref 5–15)
BUN: 24 mg/dL — ABNORMAL HIGH (ref 8–23)
CO2: 26 mmol/L (ref 22–32)
Calcium: 7.9 mg/dL — ABNORMAL LOW (ref 8.9–10.3)
Chloride: 109 mmol/L (ref 98–111)
Creatinine, Ser: 0.56 mg/dL — ABNORMAL LOW (ref 0.61–1.24)
GFR, Estimated: >60 mL/min (ref >60)
Glucose, Bld: 134 mg/dL — ABNORMAL HIGH (ref 70–99)
Potassium: 4 mmol/L (ref 3.5–5.1)
Sodium: 142 mmol/L (ref 135–145)
Total Bilirubin: 3 mg/dL — ABNORMAL HIGH (ref 0.3–1.2)
Total Protein: 5.1 g/dL — ABNORMAL LOW (ref 6.5–8.1)

## 2021-06-19 MED ORDER — SODIUM CHLORIDE 0.9 % IV SOLN
1.0000 g | Freq: Every day | INTRAVENOUS | Status: DC
  Administered 2021-06-19: 1 g via INTRAVENOUS
  Filled 2021-06-19 (×2): qty 10

## 2021-06-19 MED ORDER — PANTOPRAZOLE SODIUM 40 MG PO TBEC
40.0000 mg | DELAYED_RELEASE_TABLET | Freq: Every day | ORAL | Status: DC
  Administered 2021-06-19 – 2021-06-22 (×4): 40 mg via ORAL
  Filled 2021-06-19 (×4): qty 1

## 2021-06-19 MED ORDER — GABAPENTIN 300 MG PO CAPS
900.0000 mg | ORAL_CAPSULE | Freq: Four times a day (QID) | ORAL | Status: DC
  Administered 2021-06-19 – 2021-06-22 (×9): 900 mg via ORAL
  Filled 2021-06-19 (×5): qty 3
  Filled 2021-06-19: qty 9
  Filled 2021-06-19 (×3): qty 3

## 2021-06-19 MED ORDER — ENZALUTAMIDE 40 MG PO TABS
160.0000 mg | ORAL_TABLET | Freq: Every day | ORAL | Status: DC
  Administered 2021-06-19 – 2021-06-22 (×4): 160 mg via ORAL
  Filled 2021-06-19 (×5): qty 4

## 2021-06-19 NOTE — Progress Notes (Signed)
Patient's orientation noted to wax and wane throughout shift. Patient notably confused this morning and attempting to get out of bed unsuccessfully. Patient unable to sit upright by himself. Patient redirected successfully and able to demonstrate successful call light use. Bed alarm remains activated.

## 2021-06-19 NOTE — TOC Initial Note (Signed)
Transition of Care [facility]) - Initial/Assessment Note    Patient Details  Name: Earl Ramirez MRN: [id number] Date of Birth: 01/17/1941  Transition of [facility]) CM/SW Contact:    Shelbie Hutching, RN Phone Number: 06/19/2021, 1:30 PM  Clinical Narrative:                 Patient admitted to the hospital with thrombocytopenia.  RNCM met with patient at the bedside, brother is also at the bedside feeding patient breakfast.  Patient is from home where he lives alone, he uses a rollator to get around in the house.  Brother provides all transportation.  PT is recommending SNF for rehab and patient agrees.  Patient would like to go to rehab in [place].  RNCM will start bed search.    Expected Discharge Plan: [place] Barriers to Discharge: Continued Medical Work up   Patient Goals and CMS Choice Patient states their goals for this hospitalization and ongoing recovery are:: Patient wants to go to rehab in [facility].gov Compare Post Acute Care list provided to:: Patient Choice offered to / list presented to : Patient  Expected Discharge Plan and Services Expected Discharge Plan: [place]   Discharge Planning Services: CM Consult Post Acute Care Choice: [place] Living arrangements for the past 2 months: Single Family Home                 DME Arranged: N/A DME Agency: NA       HH Arranged: NA HH Agency: NA        Prior Living Arrangements/Services Living arrangements for the past 2 months: Single Family Home Lives with:: Self Patient language and need for interpreter reviewed:: Yes Do you feel safe going back to the place where you live?: Yes      Need for Family Participation in Patient Care: Yes (Comment) Care giver support system in place?: Yes (comment) (brother) Current home services: DME (rollator, cane) Criminal Activity/Legal Involvement Pertinent to Current Situation/Hospitalization: No - Comment as  needed  Activities of Daily Living Home Assistive Devices/Equipment: Environmental consultant (specify type) ADL Screening (condition at time of admission) Patient's cognitive ability adequate to safely complete daily activities?: Yes Is the patient deaf or have difficulty hearing?: No Does the patient have difficulty seeing, even when wearing glasses/contacts?: No Does the patient have difficulty concentrating, remembering, or making decisions?: No Patient able to express need for assistance with ADLs?: Yes Does the patient have difficulty dressing or bathing?: Yes Independently performs ADLs?: No Does the patient have difficulty walking or climbing stairs?: Yes Weakness of Legs: Both Weakness of Arms/Hands: Both  Permission Sought/Granted Permission sought to share information with : Case Manager, Family Supports, Customer service manager Permission granted to share information with : Yes, Verbal Permission Granted  Share Information with NAME: Arlana Pouch  Permission granted to share info w AGENCY: SNFs in [place] granted to share info w Relationship: brother  Permission granted to share info w Contact Information: [phone]  Emotional Assessment Appearance:: Appears stated age Attitude/Demeanor/Rapport: Engaged Affect (typically observed): Accepting Orientation: : Oriented to Self, Oriented to Place, Oriented to  Time, Oriented to Situation Alcohol / Substance Use: Not Applicable Psych Involvement: No (comment)  Admission diagnosis:  Portal hypertension ([place]) [K76.6] Bilirubinemia [E80.6] Weakness [R53.1] Acute cystitis with hematuria [N30.01] Cellulitis, unspecified cellulitis site [L03.90] Metastatic malignant neoplasm, unspecified site [facility]) [C79.9] Patient Active Problem List   Diagnosis Date Noted   Lower extremity weakness 06/18/2021  Thyroid nodule 06/18/2021   A-fib ([place]) 06/18/2021   Primary hypertension 06/18/2021   Other cirrhosis of liver ([place])  06/18/2021   Weakness 06/18/2021   Metastatic malignant neoplasm ([place])    Portal hypertension ([place])    Vitamin D deficiency 03/27/2020   Chronic venous insufficiency 03/23/2020   PAD (peripheral artery disease) ([place]) 03/23/2020   Lymphedema 03/23/2020   COPD (chronic obstructive pulmonary disease) ([place]) 12/17/2019   Idiopathic peripheral neuropathy 12/17/2019   Sleep apnea 12/17/2019   Splenomegaly 12/17/2019   Aortic valve disease 06/08/2018   Bilateral leg edema 03/19/2017   Bilateral chronic knee pain 02/12/2016   Bilateral leg pain 02/12/2016   Incomplete emptying of bladder 04/07/2015   Chronic atrial flutter ([place]) 01/30/2015   SOB (shortness of breath) 12/07/2014   Thrombocytopenia ([place]) 12/02/2014   Prostate cancer ([place]) 11/22/2014   Pyuria 10/06/2014   Gross hematuria 10/04/2014   Urinary retention 10/04/2014   PCP:  Baxter Hire, MD Pharmacy:   [facility] [place] [address] Phone: [phone] Fax: [phone]  [place], [place] [address] [place] [address] Phone: [phone] Fax: [facility] [address]), [place]) [place] [address] Phone: [phone] Fax: [place]. [place] [address] Phone: [phone] Fax: [phone]     Social Determinants of Health (SDOH) Interventions    Readmission Risk Interventions No flowsheet data found.

## 2021-06-19 NOTE — Progress Notes (Signed)
[place] [place] NAME: Earl Ramirez    MR#:  [id number]  DATE OF BIRTH:  1941-01-03  SUBJECTIVE:  patient came in with bilateral lower extremity weakness. He lives alone at home. He is not able to care for himself as far as showering and personal hygiene given his weakness. Denies any any fall. He has bilateral lower extremity chronic lymphedema. Patient tells me he showers every three days. Unable to get in and out of his bathtub. Denies any urinary incontinence for stool incontinence.  Did work some with physical therapy earlier.  REVIEW OF SYSTEMS:   Review of Systems  Constitutional:  Negative for chills, fever and weight loss.  HENT:  Negative for ear discharge, ear pain and nosebleeds.   Eyes:  Negative for blurred vision, pain and discharge.  Respiratory:  Negative for sputum production, shortness of breath, wheezing and stridor.   Cardiovascular:  Negative for chest pain, palpitations, orthopnea and PND.  Gastrointestinal:  Negative for abdominal pain, diarrhea, nausea and vomiting.  Genitourinary:  Negative for frequency and urgency.  Musculoskeletal:  Positive for back pain and joint pain.  Neurological:  Positive for weakness. Negative for sensory change, speech change and focal weakness.  Psychiatric/Behavioral:  Negative for depression and hallucinations. The patient is not nervous/anxious.   Tolerating Diet:yes Tolerating PT:   DRUG ALLERGIES:   Allergies  Allergen Reactions   Oxycodone-Acetaminophen Nausea Only and Shortness Of Breath   Oxycodone-Acetaminophen     Other reaction(s): Drowsy   Amoxicillin Itching    Hand itching   Penicillins Rash    Did it involve swelling of the face/tongue/throat, SOB, or low BP? No Did it involve sudden or severe rash/hives, skin peeling, or any reaction on the inside of your mouth or nose? No Did you need to seek medical attention at a hospital or doctor's office? No When did  it last happen?      4-5 years ago If all above answers are "NO", may proceed with cephalosporin use.     VITALS:  Blood pressure (!) 112/46, pulse 78, temperature 98.8 F (37.1 C), resp. rate 20, height 5\' 7"  (1.702 m), weight 108 kg, SpO2 98 %.  PHYSICAL EXAMINATION:   Physical Exam  GENERAL:  80 y.o.-year-old patient lying in the bed with no acute distress.  HEENT: Head atraumatic, normocephalic. Oropharynx and nasopharynx clear.  LUNGS: Normal breath sounds bilaterally, no wheezing, rales, rhonchi. No use of accessory muscles of respiration.  CARDIOVASCULAR: S1, S2 normal. No murmurs, rubs, or gallops.  ABDOMEN: Soft, nontender, nondistended. Bowel sounds present. No organomegaly or mass.  EXTREMITIES: No cyanosis, clubbing or edema b/l.    NEUROLOGIC: nonfocal PSYCHIATRIC:  patient is alert and oriented x 3.  SKIN:    LABORATORY PANEL:  CBC Recent Labs  Lab 06/17/21 1933  WBC 3.9*  HGB 13.4  HCT 39.3  PLT 41*    Chemistries  Recent Labs  Lab 06/18/21 0652 06/19/21 0618  NA  --  142  K  --  4.0  CL  --  109  CO2  --  26  GLUCOSE  --  134*  BUN  --  24*  CREATININE  --  0.56*  CALCIUM  --  7.9*  MG 1.9  --   AST  --  55*  ALT  --  30  ALKPHOS  --  39  BILITOT  --  3.0*   Cardiac Enzymes No results for input(s): TROPONINI in  the last 168 hours. RADIOLOGY:  CT Cervical Spine Wo Contrast  Result Date: 06/18/2021 CLINICAL DATA:  Generalized weakness, unable to get out of bed, incontinence. Patient is undergoing treatment for prostate cancer. Metastatic disease evaluation EXAM: CT CERVICAL SPINE WITHOUT CONTRAST TECHNIQUE: Multidetector CT imaging of the cervical spine was performed without intravenous contrast. Multiplanar CT image reconstructions were also generated. COMPARISON:  None. FINDINGS: Alignment: Normal. Skull base and vertebrae: Skull base alignment is maintained. Vertebral body heights are preserved. There is no evidence of acute fracture. There  is no suspicious osseous lesion. Soft tissues and spinal canal: There is no definite abnormal soft tissue within the spinal canal. The paraspinal soft tissues are unremarkable. Disc levels: There is mild multilevel disc space narrowing with associated endplate irregularity and endplate osteophytes, most prominent at C6-C7. There is multilevel uncovertebral and facet arthropathy. Findings result in mild spinal canal stenosis at C6-C7 and multilevel moderate to severe neural foraminal stenosis including on the left at C2-C3, bilaterally at C3-C4, on the right at C4-C5, and bilaterally at C6-C7. Upper chest: A right pleural effusion is partially imaged. Other: There is a peripherally calcified right thyroid nodule measuring 1.5 cm. IMPRESSION: 1. No suspicious osseous lesion identified in the cervical spine. Please also refer to the separately dictated CT thoracic and lumbar spine. 2. Multilevel degenerative changes as above. 3. Partially imaged right pleural effusion. 4. 1.5 cm right thyroid nodule. Recommend nonemergent thyroid ultrasound if not already performed. Electronically Signed   By: Valetta Mole M.D.   On: 06/18/2021 08:46   CT Thoracic Spine Wo Contrast  Result Date: 06/18/2021 CLINICAL DATA:  Weakness, suspected metastatic disease. EXAM: CT THORACIC SPINE WITHOUT CONTRAST TECHNIQUE: Multidetector CT images of the thoracic were obtained using the standard protocol without intravenous contrast. COMPARISON:  PET-CT 01/23/2021 FINDINGS: Alignment: There is slightly exaggerated thoracic kyphosis. There is no antero or retrolisthesis. Vertebrae: Vertebral body heights are preserved. There is no definite suspicious lesion in the vertebral bodies. There is no evidence of acute fracture. There is a sclerotic lesion in the proximal right fourth rib at the costovertebral junction consistent with metastatic disease as seen on the prior PET-CT (4-49). There is a sclerotic lesion in the posterior right seventh rib,  also seen on the prior PET (4-96). Paraspinal and other soft tissues: The paraspinal soft tissues are unremarkable. There is no definite abnormal soft tissue within the canal, though this is suboptimally evaluated by CT. Disc levels: There is multilevel degenerative endplate change and mild facet arthropathy throughout the thoracic spine. The osseous spinal canal and neural foramina are patent. Other: There is a moderate right pleural effusion. The ascending thoracic aorta appears mildly ectatic measuring up to 4.3 cm. There is extensive calcified atherosclerotic plaque throughout the thoracic aorta. There are scattered prominent mediastinal lymph nodes, grossly similar to the prior PET-CT. IMPRESSION: 1. No definite evidence of osseous metastatic disease in the thoracic spine. 2. Unchanged sclerotic lesions in the right fourth rib at the costovertebral junction and posterior seventh rib compared to the prior PET-CT from 01/23/2021 3. Mild multilevel degenerative change. The osseous spinal canal and neural foramina are patent. 4. Moderate right pleural effusion. 5. Mildly enlarged ascending thoracic aorta measuring up to 4.3 cm Recommend annual imaging followup by CTA or MRA. This recommendation follows 2010 ACCF/AHA/AATS/ACR/ASA/SCA/SCAI/SIR/STS/SVM Guidelines for the Diagnosis and Management of Patients with Thoracic Aortic Disease. Circulation. 2010; 121: S505-L976. Aortic aneurysm NOS (ICD10-I71.9) Electronically Signed   By: Valetta Mole M.D.   On: 06/18/2021  08:57   MR Brain W and Wo Contrast  Result Date: 06/18/2021 CLINICAL DATA:  Brain mass or lesion. EXAM: MRI HEAD WITHOUT AND WITH CONTRAST TECHNIQUE: Multiplanar, multiecho pulse sequences of the brain and surrounding structures were obtained without and with intravenous contrast. CONTRAST:  51mL GADAVIST GADOBUTROL 1 MMOL/ML IV SOLN COMPARISON:  None similar FINDINGS: Brain: No acute infarction, hemorrhage, hydrocephalus, extra-axial collection or  mass lesion. Preserved brain volume. Mild chronic ischemic gliosis in the deep white matter. Few remote microhemorrhages asymmetric to the left hemisphere, possibly from prior trauma. Vascular: None normal flow voids and vascular enhancement Skull and upper cervical spine: Mild heterogeneity of marrow the skull base and upper cervical spine without discrete lesion. Sinuses/Orbits: Negative IMPRESSION: Age normal brain MRI Electronically Signed   By: Jorje Guild M.D.   On: 06/18/2021 07:02   MR LUMBAR SPINE WO CONTRAST  Result Date: 06/18/2021 CLINICAL DATA:  Cord compression EXAM: MRI LUMBAR SPINE WITHOUT CONTRAST TECHNIQUE: Multiplanar, multisequence MR imaging of the lumbar spine was performed. No intravenous contrast was administered. COMPARISON:  No prior MRI, correlation is made with CT lumbar spine 06/18/2021 and 01/23/2021 PET. FINDINGS: Segmentation: Sacralization of L5, with the last complete disc space at L5-S1. Alignment:  Mild dextrocurvature.  Otherwise physiologic. Vertebrae: No acute fracture or suspicious osseous lesion. Signal changes in L5 and the sacrum, consistent with prior radiation. Otherwise heterogeneous marrow signal T11-L4. Congenitally short pedicles, which narrow the AP diameter of the spinal canal. Conus medullaris and cauda equina: Conus extends to the L1 level. Conus and cauda equina appear normal. Paraspinal and other soft tissues: Edema in the subcutaneous fat. Otherwise negative. Disc levels: T12-L1: No significant disc bulge. No spinal canal stenosis or neural foraminal narrowing. L1-L2: Mild disc desiccation without significant disc bulge. Mild facet arthropathy. No spinal canal stenosis. Mild left-greater-than-right neural foraminal narrowing. L2-L3: Disc desiccation without significant disc bulge. Moderate facet arthropathy. No spinal canal stenosis. Mild left greater than right neural foraminal narrowing. L3-L4: Disc desiccation and moderate disc bulge. Mild facet  arthropathy. No spinal canal stenosis. Moderate to severe bilateral neural foraminal narrowing. L4-L5: Moderate disc bulge with left foraminal and extreme lateral protrusion. Severe facet arthropathy. Ligamentum flavum hypertrophy. Moderate spinal canal stenosis. Narrowing of the lateral recesses. Severe bilateral neural foraminal narrowing. L5-S1: No significant disc bulge. No spinal canal stenosis or neural foraminal narrowing. IMPRESSION: 1. L4-L5 moderate spinal canal stenosis and severe bilateral neural foraminal narrowing. Narrowing of the lateral recesses could affect the descending L5 nerves. 2. L3-L4 moderate to severe bilateral neural foraminal narrowing. 3. L1-L2 and L2-L3 mild left-greater-than-right neural foraminal narrowing. 4. No focal suspicious osseous lesion or evidence of cord compression. Heterogeneous marrow signal, which is nonspecific but can be seen in the setting of diabetes, obesity, tobacco use, or a myeloproliferative disorder. Electronically Signed   By: Merilyn Baba M.D.   On: 06/18/2021 13:33   CT ABDOMEN PELVIS W CONTRAST  Addendum Date: 06/18/2021   ADDENDUM REPORT: 06/18/2021 09:26 ADDENDUM: There is small sclerotic density without break in the cortical margins in the anterior medial aspect of right acetabulum suggesting sclerotic metastatic disease. Electronically Signed   By: Elmer Picker M.D.   On: 06/18/2021 09:26   Result Date: 06/18/2021 CLINICAL DATA:  Metastatic disease evaluation, weakness in the both lower extremities EXAM: CT ABDOMEN AND PELVIS WITH CONTRAST TECHNIQUE: Multidetector CT imaging of the abdomen and pelvis was performed using the standard protocol following bolus administration of intravenous contrast. CONTRAST:  128mL OMNIPAQUE IOHEXOL 300  MG/ML  SOLN COMPARISON:  PET-CT done on 01/23/2021 FINDINGS: Lower chest: There is 1.3 cm nodular density in the right lower lobe. There are patchy infiltrates in the lower lung fields, more so in the  right lower lobe. Small right pleural effusion is seen. There is minimal left pleural effusion. There are ectatic vessels at the gastroesophageal junction suggesting varices related to portal hypertension. Hepatobiliary: Liver measures 12.6 cm in length. There is nodularity in the liver surface suggesting cirrhosis. There is no dilation of bile ducts. Gallbladder stones are seen. Pancreas: No focal abnormality is seen. Spleen: Spleen is enlarged measuring 17.6 cm in length. There is 1.3 cm low-density in the spleen. This may suggest incidental benign process such as cyst or hemangioma. Less likely possibility would be neoplastic process. Adrenals/Urinary Tract: Adrenals are unremarkable. There is no hydronephrosis. There is 2.1 cm cyst in the anterior midportion of left kidney. There is 2 cm ill-defined low-density in the posterior midportion of left kidney with density measurements higher than usual for simple cyst. There are no definite renal or ureteral stones. In image 26 of series 2, there is questionable subtle increased density in the central portion of the right kidney. Urinary bladder is unremarkable. Stomach/Bowel: Small hiatal hernia is seen. Diverticulum is noted along the inner margin of duodenum. Small bowel loops are not dilated. Appendix is not distinctly seen. There is no significant wall thickening in colon. Scattered diverticula are seen. Vascular/Lymphatic: There are subcentimeter nodes in the retroperitoneum with no significant interval change. Reproductive: There is contrast in the lumen of prostatic urethra, possibly suggesting previous intervention. Prostate is not enlarged. Other: There is no ascites or pneumoperitoneum. Musculoskeletal: No definite focal lytic or sclerotic lesions are seen. IMPRESSION: Cirrhosis. Splenomegaly. 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 There is 1.3 cm nodular density in the right lower lung  fields which may suggest focal pneumonia or neoplastic process. Bilateral pleural effusions are seen, more so on the right side. There is no evidence of intestinal obstruction or pneumoperitoneum. There is no hydronephrosis. There is a 2 cm low-density lesion with indistinct margins and higher than usual density measurement for simple cyst in the posterior midportion of left kidney. This may suggest hemorrhagic cyst or neoplastic process. There are no focal lytic or sclerotic lesions in the bony structures. No new significant lymphadenopathy seen. Gallbladder stones.  There is no dilation of bile ducts. Other findings as described in the body of the report. Electronically Signed: By: Elmer Picker M.D. On: 06/18/2021 09:06   CT L-SPINE NO CHARGE  Result Date: 06/18/2021 CLINICAL DATA:  Weakness, concern for osseous metastatic disease EXAM: CT LUMBAR SPINE WITHOUT CONTRAST TECHNIQUE: Multidetector CT imaging of the lumbar spine was performed without intravenous contrast administration. Multiplanar CT image reconstructions were also generated. COMPARISON:  PET-CT 01/23/2021 FINDINGS: Segmentation: Standard; the lowest disc space is designated L5-S1. Alignment: Normal. Vertebrae: Vertebral body heights are preserved. There is no evidence of acute fracture. There is no suspicious osseous lesion involving the lumbar vertebral bodies. There is a sclerotic lesion in the right acetabulum consistent with the known metastatic lesion seen on the prior PET-CT (1-182). Paraspinal and other soft tissues: The paraspinal soft tissues are unremarkable. The abdominal and pelvic viscera are evaluated on the separately dictated CT abdomen/pelvis. Disc levels: There is mild multilevel intervertebral disc space narrowing. There is multilevel degenerative endplate change, most advanced in the lower thoracic spine through T12-L1, L3-L4, and L4-L5. There is multilevel facet  arthropathy, most advanced at L4-L5. T12-L1: No  significant spinal canal or neural foraminal stenosis. L1-L2: No significant spinal canal or neural foraminal stenosis. L2-L3: There is mild bilateral facet arthropathy, degenerative endplate change, and ligamentum flavum thickening resulting in mild spinal canal stenosis and mild bilateral neural foraminal stenosis. L3-L4: There is a diffuse disc bulge, degenerative endplate change, bilateral facet arthropathy, and ligamentum flavum thickening resulting in moderate spinal canal stenosis and mild-to-moderate bilateral neural foraminal stenosis. L4-L5: There is a diffuse disc bulge, degenerative endplate change and bilateral facet arthropathy, and ligamentum flavum thickening resulting in moderate spinal canal stenosis and severe right and moderate left neural foraminal stenosis L5-S1: No significant spinal canal or neural foraminal stenosis. IMPRESSION: 1. No evidence of osseous metastatic disease in the lumbar spine. 2. 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 3. Multilevel degenerative changes as above, most advanced at L3-L4 where there is moderate spinal canal stenosis and mild-to-moderate bilateral neural foraminal stenosis, and L4-L5 where there is moderate spinal canal stenosis and severe right and moderate left neural foraminal stenosis. 4. Please also refer to the separately dictated CT abdomen/pelvis. Electronically Signed   By: Valetta Mole M.D.   On: 06/18/2021 09:49   DG Chest Portable 1 View  Result Date: 06/18/2021 CLINICAL DATA:  Weakness in both legs EXAM: PORTABLE CHEST 1 VIEW COMPARISON:  PET-CT 01/23/2021, CT chest 11/30/2018 FINDINGS: Median sternotomy wires are again seen. The first and third wires are fractured, unchanged. The heart is enlarged. There is calcified atherosclerotic plaque of the aortic arch. The mediastinum is prominent, likely at least in part due to dilation of the ascending thoracic aorta as seen on prior cross-sectional imaging. There is  pulmonary vascular congestion and possible mild pulmonary interstitial edema. There is a trace right pleural effusion. There is no left effusion. There is no focal consolidation. There is no acute osseous abnormality. IMPRESSION: 1. Trace right pleural effusion and possible mild pulmonary interstitial edema. 2. Cardiomegaly. 3. Prominent mediastinal contours likely at least in part due to ascending thoracic aortic dilation as seen on prior cross-sectional imaging. Electronically Signed   By: Valetta Mole M.D.   On: 06/18/2021 08:07   US ABDOMEN LIMITED RUQ (LIVER/GB)  Result Date: 06/18/2021 CLINICAL DATA:  Hyperbilirubinemia EXAM: ULTRASOUND ABDOMEN LIMITED RIGHT UPPER QUADRANT COMPARISON:  10/18/2018 FINDINGS: Gallbladder: Chronic cholelithiasis. Full gallbladder but no wall thickening or focal tenderness. The largest stone measures nearly 3 cm. Common bile duct: Diameter: 3 mm Liver: Heterogeneous liver correlating with history of cirrhosis. No focal lesion identified. Portal vein is patent on color Doppler imaging with normal direction of blood flow towards the liver. Other: Trace ascites around the liver. IMPRESSION: Cirrhosis and cholelithiasis, also seen in 2020 by MRI. No acute finding. Electronically Signed   By: Jorje Guild M.D.   On: 06/18/2021 08:22   ASSESSMENT AND PLAN:  BLAYTON HUTTNER is a 80 y.o. male with a PMH significant for stage IVB prostate cancer, history of bladder outlet obstruction s/p TURP, s/p aortic valve replacement, arthritis, A. fib, COPD, HTN, GERD, neuropathy, sleep apnea, thrombocytopenia. They presented from home to the ED on 06/18/2021 with bilateral lower extremity weakness x 2 days.  At baseline, patient was able to ambulate and perform all of ADLs independently.  Last saw oncology 11/1, Dr. [name].  Has history of metastatic disease seen 01/2021.  Undergoing treatment with Xgeva.  Received Eligard 04/2021.  Patient states that he otherwise feels well but because  he was  not able to get out of his chair any longer the past 2 days, he was scared and came to ED. His legs have abnormal sensation and feel heavy. He endorses incontinence of bowel and bladder since onset.  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 Narrowing of the lateral recesses could affect the descending L5 nerves. L3-L4 moderate to severe bilateral neural foraminal narrowing.  L1-L2 and L2-L3 mild left-greater-than-right neural foraminal narrowing. No focal suspicious osseous lesion or evidence of cord compression. Heterogeneous marrow signal, which is nonspecific but can be seen in the setting of diabetes, obesity, tobacco use, or a myeloproliferative disorder. -- Patient seen by physical therapist recommends rehab. Will continue to monitor neuro- symptoms. -- Consider neurosurgery input if sign symptoms worsen  history of prostate cancer stage IV undergoing chemotherapy at the cancer center -- patient follows with Dr. [name]  bilateral lower extremity chronic lymphedema -- patient has significant dry skin -- no significant weeping ulcers noted at present. Will consider wound consult if needed  chronic thrombocytopenia -- at baseline -- patient's platelet count stays from 35-45 K--chronically  obstructive sleep apnea -- CPAP nightly  COPD -- stable  Urinary retention/overactive bladder- chronic. Last seen by urology 10/22 - continue home  [email or web address]  Procedures: Family communication : brother at bedside [name] Consults :  CODE STATUS: full DVT Prophylaxis : SCD due to thrombocytopenia Level of care: Telemetry Medical Status is: Inpatient  Remains inpatient appropriate because: workup for bilateral lower extremity weakness, physical therapy, TOC for discharge planning        TOTAL TIME TAKING CARE OF THIS PATIENT: 25 minutes.  >50% time spent on counselling and coordination of care  Note: This dictation was prepared with Dragon dictation along with smaller phrase technology. Any transcriptional errors that result from this process are unintentional.  Fritzi Mandes M.D    [facility]   CC: Primary care physician; Baxter Hire, MD Patient ID: Earl Ramirez, male   DOB: Dec 12, 1940, 80 y.o.   MRN: [id number]

## 2021-06-19 NOTE — NC FL2 (Signed)
[place] LEVEL OF CARE SCREENING TOOL     IDENTIFICATION  Patient Name: Earl Ramirez Birthdate: 03/02/1941 Sex: male Admission Date (Current Location): 06/18/2021  [facility] and [place] Number:  Engineering geologist and Address:  [facility], [address] [place], [place] [address]      Provider Number: [id number]  Attending Physician Name and Address:  Fritzi Mandes, MD  Relative Name and Phone Number:  Arlana Pouch (brother) [phone]    Current Level of Care: Hospital Recommended Level of Care: [place] Prior Approval Number:    Date Approved/Denied:   PASRR Number: [id number]  Discharge Plan: SNF    Current Diagnoses: Patient Active Problem List   Diagnosis Date Noted   Lower extremity weakness 06/18/2021   Thyroid nodule 06/18/2021   A-fib ([place]) 06/18/2021   Primary hypertension 06/18/2021   Other cirrhosis of liver ([place]) 06/18/2021   Weakness 06/18/2021   Metastatic malignant neoplasm ([place])    Portal hypertension ([place])    Vitamin D deficiency 03/27/2020   Chronic venous insufficiency 03/23/2020   PAD (peripheral artery disease) ([place]) 03/23/2020   Lymphedema 03/23/2020   COPD (chronic obstructive pulmonary disease) ([place]) 12/17/2019   Idiopathic peripheral neuropathy 12/17/2019   Sleep apnea 12/17/2019   Splenomegaly 12/17/2019   Aortic valve disease 06/08/2018   Bilateral leg edema 03/19/2017   Bilateral chronic knee pain 02/12/2016   Bilateral leg pain 02/12/2016   Incomplete emptying of bladder 04/07/2015   Chronic atrial flutter ([facility]) 01/30/2015   SOB (shortness of breath) 12/07/2014   Thrombocytopenia ([place]) 12/02/2014   Prostate cancer ([place]) 11/22/2014   Pyuria 10/06/2014   Gross hematuria 10/04/2014   Urinary retention 10/04/2014    Orientation RESPIRATION BLADDER Height & Weight     Self, Situation, Time, Place  Normal Continent Weight: 108 kg Height:  5\' 7"  (170.2 cm)   BEHAVIORAL SYMPTOMS/MOOD NEUROLOGICAL BOWEL NUTRITION STATUS      Continent Diet (see discharge summary)  AMBULATORY STATUS COMMUNICATION OF NEEDS Skin   Extensive Assist Verbally Other (Comment) (Bilateral Unna boots)                       Personal Care Assistance Level of Assistance  Bathing, Feeding, Dressing Bathing Assistance: Maximum assistance Feeding assistance: Maximum assistance Dressing Assistance: Maximum assistance     Functional Limitations Info  Sight, Hearing, Speech Sight Info: Impaired Hearing Info: Adequate Speech Info: Adequate    SPECIAL CARE FACTORS FREQUENCY  PT (By licensed PT), OT (By licensed OT)     PT Frequency: 5 times per week OT Frequency: 5 times per week            Contractures Contractures Info: Not present    Additional Factors Info  Code Status, Allergies Code Status Info: Full Allergies Info: Oxycodone-acetaminophen, amoxicillin, PCN           Current Medications (06/19/2021):  This is the current hospital active medication list Current Facility-Administered Medications  Medication Dose Route Frequency Provider Last Rate Last Admin   ALPRAZolam Duanne Moron) tablet 0.25 mg  0.25 mg Oral Daily PRN Richarda Osmond, MD       aspirin EC tablet 81 mg  81 mg Oral Daily Richarda Osmond, MD   81 mg at 06/19/21 1028   calcium carbonate (TUMS - dosed in mg elemental calcium) chewable tablet 200 mg of elemental calcium  1 tablet Oral Daily Richarda Osmond, MD   200 mg of elemental  calcium at 06/19/21 1029   cefTRIAXone (ROCEPHIN) 1 g in sodium chloride 0.9 % 100 mL IVPB  1 g Intravenous Daily Fritzi Mandes, MD 200 mL/hr at 06/19/21 1041 1 g at 06/19/21 1041   enzalutamide (XTANDI) tablet 160 mg  160 mg Oral Daily Richarda Osmond, MD       furosemide (LASIX) tablet 20 mg  20 mg Oral Daily Doristine Mango L, MD   20 mg at 06/19/21 1029   LORazepam (ATIVAN) tablet 0.5 mg  0.5 mg Oral Once PRN Richarda Osmond, MD        metoprolol succinate (TOPROL-XL) 24 hr tablet 25 mg  25 mg Oral Daily Doristine Mango L, MD   25 mg at 06/19/21 1032   mirabegron ER (MYRBETRIQ) tablet 50 mg  50 mg Oral Daily Richarda Osmond, MD   50 mg at 06/19/21 1029   pantoprazole (PROTONIX) EC tablet 40 mg  40 mg Oral Daily Fritzi Mandes, MD   40 mg at 06/19/21 1032   tamsulosin (FLOMAX) capsule 0.4 mg  0.4 mg Oral BID Richarda Osmond, MD   0.4 mg at 06/19/21 1029     Discharge Medications: Please see discharge summary for a list of discharge medications.  Relevant Imaging Results:  Relevant Lab Results:   Additional Information SS# [id number]  Shelbie Hutching, RN

## 2021-06-19 NOTE — Evaluation (Signed)
Physical Therapy Evaluation Patient Details Name: Earl Ramirez MRN: [id number] DOB: 1941-03-11 Today's Date: 06/19/2021  History of Present Illness  Earl Ramirez is a 80 y.o. male with a PMH significant for stage IVB prostate cancer, history of bladder outlet obstruction s/p TURP, s/p aortic valve replacement, arthritis, A. fib, COPD, HTN, GERD, neuropathy, sleep apnea, thrombocytopenia. He presented from home to the ED on 06/18/2021 with bilateral lower extremity weakness x 2 days.  Clinical Impression  Pt received supine in bed, requires motivation to participate however agreeable. Upon entering, pt was asking for doctor and continued to ask throughout history. MD did arrive just prior to mobility. Pt presents with BLE weakness and limited ROM 2/2 lymphedema. Also general fatigue as he did become tired while sitting EOB. Pt performed supine<>sit with MOD A. He remained sitting EOB for >10 minutes as he received 2 phone calls; PT provided CGA at all times for safety. He was able to scoot laterally along EOB with MOD lifting assist. Standing/ambulation was not attempted due to significant BLE weakness - will assess at later date with +2 assist. Overall, pt required increased processing time when asked questions however answers remained appropriate. Pt brother arrived at end of session; PT educated brother on information MD had just told pt as pt asked PT to share. Pt would benefit from STR to address above deficits. Pt does state he would like to start looking into assisted living facilities as he recognizes he is not safe to live alone.      Recommendations for follow up therapy are one component of a multi-disciplinary discharge planning process, led by the attending physician.  Recommendations may be updated based on patient status, additional functional criteria and insurance authorization.  Follow Up Recommendations Skilled nursing-short term rehab (<3 hours/day)    Assistance Recommended at  Discharge Frequent or constant Supervision/Assistance  Functional Status Assessment Patient has had a recent decline in their functional status and demonstrates the ability to make significant improvements in function in a reasonable and predictable amount of time.  Equipment Recommendations  None recommended by PT    Recommendations for Other Services       Precautions / Restrictions Precautions Precautions: Fall Restrictions Weight Bearing Restrictions: No      Mobility  Bed Mobility Overal bed mobility: Needs Assistance Bed Mobility: Supine to Sit;Sit to Supine     Supine to sit: Mod assist;HOB elevated Sit to supine: Mod assist   General bed mobility comments: MOD A to manage trunk to upright sitting EOB and BLE back to bed.    Transfers Overall transfer level: Needs assistance                 General transfer comment: Lateral scoot transfer - MOD A to lift as pt attempted to use BLE and BUE. Pt provided lateral translation. ~53ft of movement. VC for forward lean.    Ambulation/Gait               General Gait Details: deferred - will need a +2 for safety  Stairs            Wheelchair Mobility    Modified Rankin (Stroke Patients Only)       Balance Overall balance assessment: Needs assistance;History of Falls Sitting-balance support: No upper extremity supported;Feet unsupported Sitting balance-Leahy Scale: Fair Sitting balance - Comments: Able to maintain for 10 minutes however CGA at all times for safety.       Standing balance comment: deferred  Pertinent Vitals/Pain Pain Assessment: Faces Faces Pain Scale: No hurt    Home Living Family/patient expects to be discharged to:: Private residence Living Arrangements: Alone Available Help at Discharge: Family;Available PRN/intermittently Type of Home: House Home Access: Stairs to enter Entrance Stairs-Rails: Can reach both Entrance  Stairs-Number of Steps: 2+6   Home Layout: One level Home Equipment: Shower seat;Grab bars - tub/shower;Cane - single point;Rollator (4 wheels);Rolling Walker (2 wheels)      Prior Function Prior Level of Function : Needs assist;Independent/Modified Independent;History of Falls (last six months)             Mobility Comments: Pt reports ambulating household distances with Rollator. He does not go into the community unless for a doctors appointment; uses county transportation. ADLs Comments: Pt uses AE for LB dressing; occassionally takes showers but frequent relies on sponge baths; has groceries delivered; stopped driving ~ 2 months ago     Hand Dominance        Extremity/Trunk Assessment   Upper Extremity Assessment Upper Extremity Assessment: Overall WFL for tasks assessed    Lower Extremity Assessment Lower Extremity Assessment: Generalized weakness (Generally 2- to 3/5. LLE weaker than R. Unable to complete full range against gravity in B hip flexion. Limited ROM in ankles 2/2 lymphedema.)       Communication   Communication: No difficulties  Cognition Arousal/Alertness: Awake/alert Behavior During Therapy: WFL for tasks assessed/performed Overall Cognitive Status: Within Functional Limits for tasks assessed                                 General Comments: A&Ox4; answered questions appropriately however required increased processing time.        General Comments General comments (skin integrity, edema, etc.): Lymphedema in BLE with dry, cracked skin.    Exercises     Assessment/Plan    PT Assessment Patient needs continued PT services  PT Problem List Decreased strength;Decreased mobility;Decreased safety awareness;Decreased range of motion;Decreased activity tolerance;Decreased balance       PT Treatment Interventions DME instruction;Therapeutic activities;Gait training;Therapeutic exercise;Patient/family education;Stair training;Balance  training;Functional mobility training;Neuromuscular re-education    PT Goals (Current goals can be found in the Care Plan section)  Acute Rehab PT Goals Patient Stated Goal: go to rehab and begin planning to move into assisted living PT Goal Formulation: With patient Time For Goal Achievement: 07/03/21 Potential to Achieve Goals: Fair    Frequency Min 2X/week   Barriers to discharge Inaccessible home environment;Decreased caregiver support      Co-evaluation               AM-PAC PT "6 Clicks" Mobility  Outcome Measure Help needed turning from your back to your side while in a flat bed without using bedrails?: A Little Help needed moving from lying on your back to sitting on the side of a flat bed without using bedrails?: A Lot Help needed moving to and from a bed to a chair (including a wheelchair)?: A Lot Help needed standing up from a chair using your arms (e.g., wheelchair or bedside chair)?: A Lot Help needed to walk in hospital room?: Total Help needed climbing 3-5 steps with a railing? : Total 6 Click Score: 11    End of Session Equipment Utilized During Treatment: Gait belt Activity Tolerance: Patient tolerated treatment well;Patient limited by fatigue Patient left: in bed;with call bell/phone within reach;with bed alarm set;with family/visitor present Nurse Communication: Mobility status PT Visit  Diagnosis: Unsteadiness on feet (R26.81);Muscle weakness (generalized) (M62.81);History of falling (Z91.81);Difficulty in walking, not elsewhere classified (R26.2)    Time: 3685-9923 PT Time Calculation (min) (ACUTE ONLY): 54 min   Charges:   PT Evaluation $PT Eval Moderate Complexity: 1 Mod PT Treatments $Therapeutic Activity: 23-37 mins $Neuromuscular Re-education: 8-22 mins        Patrina Levering PT, DPT 06/19/21 10:43 AM [phone]

## 2021-06-20 LAB — HEPATITIS PANEL, ACUTE
HCV Ab: NONREACTIVE
Hep A IgM: NONREACTIVE
Hep B C IgM: NONREACTIVE
Hepatitis B Surface Ag: NONREACTIVE

## 2021-06-20 LAB — URINE CULTURE: Culture: 40000 — AB

## 2021-06-20 MED ORDER — IPRATROPIUM-ALBUTEROL 0.5-2.5 (3) MG/3ML IN SOLN
3.0000 mL | Freq: Four times a day (QID) | RESPIRATORY_TRACT | Status: DC | PRN
  Administered 2021-06-20: 12:00:00 3 mL via RESPIRATORY_TRACT
  Filled 2021-06-20: qty 3

## 2021-06-20 MED ORDER — CEPHALEXIN 500 MG PO CAPS
500.0000 mg | ORAL_CAPSULE | Freq: Two times a day (BID) | ORAL | Status: DC
  Administered 2021-06-20 – 2021-06-22 (×5): 500 mg via ORAL
  Filled 2021-06-20 (×5): qty 1

## 2021-06-20 MED ORDER — IPRATROPIUM-ALBUTEROL 0.5-2.5 (3) MG/3ML IN SOLN: 3.0000 mL | RESPIRATORY_TRACT | Status: DC

## 2021-06-20 NOTE — Progress Notes (Signed)
Physical Therapy Treatment Patient Details Name: Earl Ramirez MRN: [id number] DOB: 07-07-1941 Today's Date: 06/20/2021   History of Present Illness Earl Ramirez is a 80 y.o. male with a PMH significant for stage IVB prostate cancer, history of bladder outlet obstruction s/p TURP, s/p aortic valve replacement, arthritis, A. fib, COPD, HTN, GERD, neuropathy, sleep apnea, thrombocytopenia. He presented from home to the ED on 06/18/2021 with bilateral lower extremity weakness x 2 days.    PT Comments    Marked improvement in functional strength and overall mobility performance this date.  Able to complete sit/stand, OOB transfers and OOB to chair with RW, min/mod assist +1.  Continues with pronounced weakness L > R LE (maintains partially flexed in stance, high risk for buckling), but do anticipate continued improvement with additional therapy and edema management.     Recommendations for follow up therapy are one component of a multi-disciplinary discharge planning process, led by the attending physician.  Recommendations may be updated based on patient status, additional functional criteria and insurance authorization.  Follow Up Recommendations  Skilled nursing-short term rehab (<3 hours/day)     Assistance Recommended at Discharge Frequent or constant Supervision/Assistance  Equipment Recommendations  None recommended by PT    Recommendations for Other Services       Precautions / Restrictions Precautions Precautions: Fall Restrictions Weight Bearing Restrictions: No     Mobility  Bed Mobility Overal bed mobility: Needs Assistance Bed Mobility: Supine to Sit     Supine to sit: Mod assist          Transfers Overall transfer level: Needs assistance Equipment used: Rolling walker (2 wheels) Transfers: Sit to/from Stand Sit to Stand: Mod assist           General transfer comment: performed from elevated bed surface, assist for lift off, anterior weight  translation and initial standing balance. Once upright and in midilne, able to maintain with min assist +1    Ambulation/Gait               General Gait Details: deferred this date   Stairs             Wheelchair Mobility    Modified Rankin (Stroke Patients Only)       Balance Overall balance assessment: Needs assistance Sitting-balance support: No upper extremity supported;Feet supported Sitting balance-Leahy Scale: Good     Standing balance support: Bilateral upper extremity supported Standing balance-Leahy Scale: Fair                              Cognition Arousal/Alertness: Awake/alert   Overall Cognitive Status: Within Functional Limits for tasks assessed                                          Exercises Other Exercises Other Exercises: Supine LE therex, 1x10, active ROM: ankle pumps, quad sets, SAQs, heel slides (with resisted extension), hip abduct/adduct (with manual resistance).  R LE grossly 3+/5 to 4-/5,  L LE grossly 3-/5 this date; both improved from initial evaluation (unna boots intact bilat with notable reduction in edema per patient) Other Exercises: Lateral scooting edge of bed, close sup.  Min assist to position LES for optimal closed-chain WBing and force generation, but able to effectively scoot without physical assist (though minimal clearance of buttocks) Other Exercises: Toilet transfer, SPT  with RW, min/mod assist for safety.  Maintains partial knee flexion L knee (high risk for buckling) and increased difficulty with advancement of L LE.  Sit/stand from [facility] with RW, mod assist; standing balance wiht RW for hygiene, min assist.  Dep of second person for hygiene. BSC to recliner transfer with RW, min/mod assist +!.  Cuing for full alignment with seating surface prior to sitting and for hand placement to assist with eccentric control during stand to sit (tends to maintain on RW)   General Comments         Pertinent Vitals/Pain Pain Assessment: Faces Pain Score: 4  Pain Location: b/l LE Pain Descriptors / Indicators: Burning;Aching;Heaviness;Sharp;Grimacing Pain Intervention(s): Limited activity within patient's tolerance;Monitored during session;Repositioned    Home Living                          Prior Function            PT Goals (current goals can now be found in the care plan section) Acute Rehab PT Goals PT Goal Formulation: With patient Time For Goal Achievement: 07/03/21 Potential to Achieve Goals: Fair Progress towards PT goals: Progressing toward goals    Frequency    Min 2X/week      PT Plan Current plan remains appropriate    Co-evaluation              AM-PAC PT "6 Clicks" Mobility   Outcome Measure  Help needed turning from your back to your side while in a flat bed without using bedrails?: A Little Help needed moving from lying on your back to sitting on the side of a flat bed without using bedrails?: A Lot Help needed moving to and from a bed to a chair (including a wheelchair)?: A Lot Help needed standing up from a chair using your arms (e.g., wheelchair or bedside chair)?: A Lot Help needed to walk in hospital room?: A Lot Help needed climbing 3-5 steps with a railing? : A Lot 6 Click Score: 13    End of Session Equipment Utilized During Treatment: Gait belt Activity Tolerance: Patient tolerated treatment well Patient left: in chair;with chair alarm set Nurse Communication: Mobility status PT Visit Diagnosis: Unsteadiness on feet (R26.81);Muscle weakness (generalized) (M62.81);History of falling (Z91.81);Difficulty in walking, not elsewhere classified (R26.2)     Time: 4081-4481, [phone] (assisted to recliner upon completion of toileting) PT Time Calculation (min) (ACUTE ONLY): 28 min  Charges:  $Therapeutic Exercise: 8-22 mins $Therapeutic Activity: 23-37 mins                     Lindbergh Winkles H. Owens Shark, PT, DPT, NCS 06/20/21,  9:46 PM [phone]

## 2021-06-20 NOTE — Plan of Care (Signed)

## 2021-06-20 NOTE — Progress Notes (Addendum)
[place] at [place] NAME: Earl Ramirez    MR#:  [id number]  DATE OF BIRTH:  May 21, 1941  SUBJECTIVE:  patient came in with bilateral lower extremity weakness. He lives alone at home. He is not able to care for himself as far as showering and personal hygiene given his weakness. Denies any any fall. He has bilateral lower extremity chronic lymphedema. Denies any urinary incontinence for stool incontinence.  No new complaints palpation. No new issues per RN.  REVIEW OF SYSTEMS:   Review of Systems  Constitutional:  Negative for chills, fever and weight loss.  HENT:  Negative for ear discharge, ear pain and nosebleeds.   Eyes:  Negative for blurred vision, pain and discharge.  Respiratory:  Negative for sputum production, shortness of breath, wheezing and stridor.   Cardiovascular:  Negative for chest pain, palpitations, orthopnea and PND.  Gastrointestinal:  Negative for abdominal pain, diarrhea, nausea and vomiting.  Genitourinary:  Negative for frequency and urgency.  Musculoskeletal:  Positive for back pain and joint pain.  Neurological:  Positive for weakness. Negative for sensory change, speech change and focal weakness.  Psychiatric/Behavioral:  Negative for depression and hallucinations. The patient is not nervous/anxious.   Tolerating Diet:yes Tolerating PT:   DRUG ALLERGIES:   Allergies  Allergen Reactions   Oxycodone-Acetaminophen Nausea Only and Shortness Of Breath   Oxycodone-Acetaminophen     Other reaction(s): Drowsy   Amoxicillin Itching    Hand itching   Penicillins Rash    Did it involve swelling of the face/tongue/throat, SOB, or low BP? No Did it involve sudden or severe rash/hives, skin peeling, or any reaction on the inside of your mouth or nose? No Did you need to seek medical attention at a hospital or doctor's office? No When did it last happen?      4-5 years ago If all above answers are "NO", may proceed with  cephalosporin use.     VITALS:  Blood pressure (!) 110/46, pulse 67, temperature 98.2 F (36.8 C), temperature source Oral, resp. rate 18, height 5\' 7"  (1.702 m), weight 108 kg, SpO2 99 %.  PHYSICAL EXAMINATION:   Physical Exam  GENERAL:  80 y.o.-year-old patient lying in the bed with no acute distress.  HEENT: Head atraumatic, normocephalic. Oropharynx and nasopharynx clear.  LUNGS: Normal breath sounds bilaterally, no wheezing, rales, rhonchi. No use of accessory muscles of respiration.  CARDIOVASCULAR: S1, S2 normal. No murmurs, rubs, or gallops.  ABDOMEN: Soft, nontender, nondistended. Bowel sounds present. No organomegaly or mass.  EXTREMITIES: No cyanosis, clubbing or edema b/l.    NEUROLOGIC: nonfocal PSYCHIATRIC:  patient is alert and oriented x 3.  SKIN:    LABORATORY PANEL:  CBC Recent Labs  Lab 06/17/21 1933  WBC 3.9*  HGB 13.4  HCT 39.3  PLT 41*     Chemistries  Recent Labs  Lab 06/18/21 0652 06/19/21 0618  NA  --  142  K  --  4.0  CL  --  109  CO2  --  26  GLUCOSE  --  134*  BUN  --  24*  CREATININE  --  0.56*  CALCIUM  --  7.9*  MG 1.9  --   AST  --  55*  ALT  --  30  ALKPHOS  --  39  BILITOT  --  3.0*    Cardiac Enzymes No results for input(s): TROPONINI in the last 168 hours. RADIOLOGY:  MR LUMBAR SPINE WO  CONTRAST  Result Date: 06/18/2021 CLINICAL DATA:  Cord compression EXAM: MRI LUMBAR SPINE WITHOUT CONTRAST TECHNIQUE: Multiplanar, multisequence MR imaging of the lumbar spine was performed. No intravenous contrast was administered. COMPARISON:  No prior MRI, correlation is made with CT lumbar spine 06/18/2021 and 01/23/2021 PET. FINDINGS: Segmentation: Sacralization of L5, with the last complete disc space at L5-S1. Alignment:  Mild dextrocurvature.  Otherwise physiologic. Vertebrae: No acute fracture or suspicious osseous lesion. Signal changes in L5 and the sacrum, consistent with prior radiation. Otherwise heterogeneous marrow signal  T11-L4. Congenitally short pedicles, which narrow the AP diameter of the spinal canal. Conus medullaris and cauda equina: Conus extends to the L1 level. Conus and cauda equina appear normal. Paraspinal and other soft tissues: Edema in the subcutaneous fat. Otherwise negative. Disc levels: T12-L1: No significant disc bulge. No spinal canal stenosis or neural foraminal narrowing. L1-L2: Mild disc desiccation without significant disc bulge. Mild facet arthropathy. No spinal canal stenosis. Mild left-greater-than-right neural foraminal narrowing. L2-L3: Disc desiccation without significant disc bulge. Moderate facet arthropathy. No spinal canal stenosis. Mild left greater than right neural foraminal narrowing. L3-L4: Disc desiccation and moderate disc bulge. Mild facet arthropathy. No spinal canal stenosis. Moderate to severe bilateral neural foraminal narrowing. L4-L5: Moderate disc bulge with left foraminal and extreme lateral protrusion. Severe facet arthropathy. Ligamentum flavum hypertrophy. Moderate spinal canal stenosis. Narrowing of the lateral recesses. Severe bilateral neural foraminal narrowing. L5-S1: No significant disc bulge. No spinal canal stenosis or neural foraminal narrowing. IMPRESSION: 1. L4-L5 moderate spinal canal stenosis and severe bilateral neural foraminal narrowing. Narrowing of the lateral recesses could affect the descending L5 nerves. 2. L3-L4 moderate to severe bilateral neural foraminal narrowing. 3. L1-L2 and L2-L3 mild left-greater-than-right neural foraminal narrowing. 4. No focal suspicious osseous lesion or evidence of cord compression. Heterogeneous marrow signal, which is nonspecific but can be seen in the setting of diabetes, obesity, tobacco use, or a myeloproliferative disorder. Electronically Signed   By: Merilyn Baba M.D.   On: 06/18/2021 13:33   ASSESSMENT AND PLAN:  Earl Ramirez is a 80 y.o. male with a PMH significant for stage IVB prostate cancer, history of bladder  outlet obstruction s/p TURP, s/p aortic valve replacement, arthritis, A. fib, COPD, HTN, GERD, neuropathy, sleep apnea, thrombocytopenia. They presented from home to the ED on 06/18/2021 with bilateral lower extremity weakness x 2 days.  At baseline, patient was able to ambulate and perform all of ADLs independently.  Last saw oncology 11/1, Dr. [name].  Has history of metastatic disease seen 01/2021.  Undergoing treatment with Xgeva.  Received Eligard 04/2021.  Patient states that he otherwise feels well but because he was not able to get out of his chair any longer the past 2 days, he was scared and came to ED. His legs have abnormal sensation and feel heavy. He endorses incontinence of bowel and bladder since onset.  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 Narrowing of the lateral recesses could affect the descending L5 nerves. L3-L4 moderate to severe bilateral neural foraminal narrowing.  L1-L2 and L2-L3 mild left-greater-than-right neural foraminal narrowing. No focal  suspicious osseous lesion or evidence of cord compression. Heterogeneous marrow signal, which is nonspecific but can be seen in the setting of diabetes, obesity, tobacco use, or a myeloproliferative disorder. -- Patient seen by physical therapist recommends rehab. Will continue to monitor neuro- symptoms. -- Consider neurosurgery input if sign symptoms worsen  history of prostate cancer stage IV undergoing chemotherapy at the cancer center --  patient follows with Dr. [name]  bilateral lower extremity chronic lymphedema -- patient has significant dry skin -- no significant weeping ulcers noted at present. Will consider wound consult if needed  chronic atrial fibrillation -- heart rate stable. Patient follows with Dr. [name] -- not a candidate for oral anticoagulation due to chronic thrombocytopenia (per cardiology)  chronic thrombocytopenia -- at baseline -- patient's platelet count stays from 35-45 K--chronically  obstructive sleep apnea -- CPAP nightly  COPD -- stable  Urinary retention/overactive bladder- chronic. Last seen by urology 10/22 - continue home myrbetriq  Procedures: Family communication : brother at bedside [name] 11/15 Consults :  CODE STATUS: full DVT Prophylaxis : SCD due to thrombocytopenia Level of care: Telemetry Medical Status is: Inpatient  Remains inpatient appropriate because: workup for bilateral lower extremity weakness, physical therapy, TOC for discharge planning   11/16-- per [facility] patient has bed offers. Patient is accepted bed.. Awaiting insurance authorization.     TOTAL TIME TAKING CARE OF THIS PATIENT: 25 minutes.  >50% time spent on counselling and coordination of care  Note: This dictation was prepared with Dragon dictation along with smaller phrase technology. Any transcriptional errors that result from this process are unintentional.  Fritzi Mandes M.D    [facility]   CC: Primary care physician; Baxter Hire, MD Patient ID: [name], male   DOB: 03-16-1941, 80 y.o.   MRN: [id number]

## 2021-06-20 NOTE — TOC Progression Note (Signed)
Transition of Care [facility]) - Progression Note    Patient Details  Name: Earl Ramirez MRN: [id number] Date of Birth: February 26, 1941  Transition of Care [facility] CM/SW Contact  Shelbie Hutching, RN Phone Number: 06/20/2021, 11:10 AM  Clinical Narrative:    [facility] and [facility] have offered beds, patient chooses [place].  RNCM has initiated insurance authorization with [employer] for SNF and [facility].   Message left for patient's brother to return call so he can be updated.     Expected Discharge Plan: [place] Barriers to Discharge: Continued Medical Work up  Expected Discharge Plan and Services Expected Discharge Plan: [place]   Discharge Planning Services: CM Consult Post Acute Care Choice: [place] Living arrangements for the past 2 months: Single Family Home                 DME Arranged: N/A DME Agency: NA       HH Arranged: NA HH Agency: NA         Social Determinants of Health (SDOH) Interventions    Readmission Risk Interventions No flowsheet data found.

## 2021-06-21 NOTE — Progress Notes (Signed)
Occupational Therapy Treatment Patient Details Name: DENT PLANTZ MRN: [id number] DOB: 1941-08-02 Today's Date: 06/21/2021   History of present illness Earl Ramirez is a 80 y.o. male with a PMH significant for stage IVB prostate cancer, history of bladder outlet obstruction s/p TURP, s/p aortic valve replacement, arthritis, A. fib, COPD, HTN, GERD, neuropathy, sleep apnea, thrombocytopenia. He presented from home to the ED on 06/18/2021 with bilateral lower extremity weakness x 2 days.   OT comments  Pt seen for OT tx this date to f/u re: safety with ADLs/ADL mobility. Pt agreeable to session and excited to get OOB. OT engages pt in sup to sit with MIN/MOD A and he demos G static sitting balance. OT then engages pt in seated EOB ADLs including donning socks with MAX A. Pt able to CTS with RW x2 trials with MIN A from slightly elevated EOB surface. OT cues for hand placement/safety and provides rationale education. Pt, with MIN A and RW, is able to take ~5-6 small steps from bed side to recliner adjacent. Pt left in chair with chair alarm and RN notified. OT leaves pt's LEs down at his request, but encourages him to elevate after ~30 mins and he is agreeable. Will continue to follow acutely. Continue to anticipate that he will require STR to improve strength and fxn to safely return to home environment.    Recommendations for follow up therapy are one component of a multi-disciplinary discharge planning process, led by the attending physician.  Recommendations may be updated based on patient status, additional functional criteria and insurance authorization.    Follow Up Recommendations  Skilled nursing-short term rehab (<3 hours/day)    Assistance Recommended at Discharge Frequent or constant Supervision/Assistance  Equipment Recommendations  Wheelchair (measurements OT)    Recommendations for Other Services      Precautions / Restrictions Precautions Precautions:  Fall Restrictions Weight Bearing Restrictions: No       Mobility Bed Mobility Overal bed mobility: Needs Assistance Bed Mobility: Supine to Sit     Supine to sit: Min assist;Mod assist;HOB elevated     General bed mobility comments: increased time, HOB elevated, cues for use of bed rails, mostly needs assist with L LE to progress it toward EOB as that side is more pain limited/weaker from CA    Transfers Overall transfer level: Needs assistance Equipment used: Rolling walker (2 wheels) Transfers: Sit to/from Stand Sit to Stand: Min assist           General transfer comment: slightly elevated bed surface of ~2-3 inches, cues for sequence of hand placement/safe 2WW use.     Balance Overall balance assessment: Needs assistance Sitting-balance support: Feet supported Sitting balance-Leahy Scale: Good     Standing balance support: Bilateral upper extremity supported Standing balance-Leahy Scale: Fair Standing balance comment: UE support on RW                           ADL either performed or assessed with clinical judgement   ADL                                              Extremity/Trunk Assessment              Vision       Perception     Praxis  Cognition Arousal/Alertness: Awake/alert Behavior During Therapy: WFL for tasks assessed/performed Overall Cognitive Status: Within Functional Limits for tasks assessed                                 General Comments: A&O, somewhat slow speech cadence          Exercises Other Exercises Other Exercises: OT engages pt in sup to sit with MIN/MOD A and he demos G static sitting balance. OT then engages pt in seated EOB ADLs including donning socks with MAX A. Pt able to CTS with RW x2 trials with MIN A from slightly elevated EOB surface. OT cues for hand placement/safety and provides rationale education. Pt, with MIN A and RW, is able to take ~5-6 small steps  from bed side to recliner adjacent. Pt left in chair with chair alarm and RN notified. OT leaves pt's LEs down at his request, but encourages him to elevate after ~30 mins and he is agreeable.   Shoulder Instructions       General Comments      Pertinent Vitals/ Pain       Pain Assessment: 0-10 Pain Score: 4  Pain Location: b/l LE with L worse than R Pain Descriptors / Indicators: Sore;Nagging Pain Intervention(s): Limited activity within patient's tolerance;Monitored during session;Repositioned  Home Living                                          Prior Functioning/Environment              Frequency  Min 2X/week        Progress Toward Goals  OT Goals(current goals can now be found in the care plan section)  Progress towards OT goals: Progressing toward goals  Acute Rehab OT Goals Patient Stated Goal: to get stronger OT Goal Formulation: With patient Time For Goal Achievement: 07/02/21 Potential to Achieve Goals: Good  Plan Discharge plan remains appropriate    Co-evaluation                 AM-PAC OT "6 Clicks" Daily Activity     Outcome Measure   Help from another person eating meals?: A Little Help from another person taking care of personal grooming?: A Little Help from another person toileting, which includes using toliet, bedpan, or urinal?: A Lot Help from another person bathing (including washing, rinsing, drying)?: A Lot Help from another person to put on and taking off regular upper body clothing?: A Lot Help from another person to put on and taking off regular lower body clothing?: Total 6 Click Score: 13    End of Session Equipment Utilized During Treatment: Gait belt;Rolling walker (2 wheels)  OT Visit Diagnosis: Unsteadiness on feet (R26.81);Other abnormalities of gait and mobility (R26.89);Muscle weakness (generalized) (M62.81)   Activity Tolerance Patient tolerated treatment well   Patient Left with call  bell/phone within reach;in chair;with chair alarm set   Nurse Communication Mobility status;Other (comment) (notified that pt feels concerned he will not push call button in time to use BSC for BM, but when OT offered, reported he did not need to go at this time.)        Time: 1028-1101 OT Time Calculation (min): 33 min  Charges: OT General Charges $OT Visit: 1 Visit OT Treatments $Self Care/Home Management : 8-22 mins $Therapeutic Activity:  8-22 mins  Gerrianne Scale, [place], OTR/L ascom [phone] 06/21/21, 3:33 PM

## 2021-06-21 NOTE — Progress Notes (Signed)
[place] at [place] NAME: Earl Ramirez    MR#:  [id number]  DATE OF BIRTH:  April 30, 1941  SUBJECTIVE:  patient came in with bilateral lower extremity weakness. He lives alone at home. He is not able to care for himself as far as showering and personal hygiene given his weakness. Denies any any fall. He has bilateral lower extremity chronic lymphedema. Denies any urinary incontinence for stool incontinence.  No new complaints palpation. No new issues per RN.  REVIEW OF SYSTEMS:   Review of Systems  Constitutional:  Negative for chills, fever and weight loss.  HENT:  Negative for ear discharge, ear pain and nosebleeds.   Eyes:  Negative for blurred vision, pain and discharge.  Respiratory:  Negative for sputum production, shortness of breath, wheezing and stridor.   Cardiovascular:  Negative for chest pain, palpitations, orthopnea and PND.  Gastrointestinal:  Negative for abdominal pain, diarrhea, nausea and vomiting.  Genitourinary:  Negative for frequency and urgency.  Musculoskeletal:  Positive for back pain and joint pain.  Neurological:  Positive for weakness. Negative for sensory change, speech change and focal weakness.  Psychiatric/Behavioral:  Negative for depression and hallucinations. The patient is not nervous/anxious.   Tolerating Diet:yes Tolerating PT:   DRUG ALLERGIES:   Allergies  Allergen Reactions   Oxycodone-Acetaminophen Nausea Only and Shortness Of Breath   Oxycodone-Acetaminophen     Other reaction(s): Drowsy   Amoxicillin Itching    Hand itching   Penicillins Rash    Did it involve swelling of the face/tongue/throat, SOB, or low BP? No Did it involve sudden or severe rash/hives, skin peeling, or any reaction on the inside of your mouth or nose? No Did you need to seek medical attention at a hospital or doctor's office? No When did it last happen?      4-5 years ago If all above answers are "NO", may proceed with  cephalosporin use.     VITALS:  Blood pressure (!) 119/55, pulse 73, temperature (!) 97.5 F (36.4 C), temperature source Oral, resp. rate 20, height 5\' 7"  (1.702 m), weight 108 kg, SpO2 99 %.  PHYSICAL EXAMINATION:   Physical Exam  GENERAL:  80 y.o.-year-old patient lying in the bed with no acute distress.  HEENT: Head atraumatic, normocephalic. Oropharynx and nasopharynx clear.  LUNGS: Normal breath sounds bilaterally, no wheezing, rales, rhonchi. No use of accessory muscles of respiration.  CARDIOVASCULAR: S1, S2 normal. No murmurs, rubs, or gallops.  ABDOMEN: Soft, nontender, nondistended. Bowel sounds present. No organomegaly or mass.  EXTREMITIES: No cyanosis, clubbing or edema b/l.    NEUROLOGIC: nonfocal PSYCHIATRIC:  patient is alert and oriented x 3.  SKIN:    LABORATORY PANEL:  CBC Recent Labs  Lab 06/17/21 1933  WBC 3.9*  HGB 13.4  HCT 39.3  PLT 41*     Chemistries  Recent Labs  Lab 06/18/21 0652 06/19/21 0618  NA  --  142  K  --  4.0  CL  --  109  CO2  --  26  GLUCOSE  --  134*  BUN  --  24*  CREATININE  --  0.56*  CALCIUM  --  7.9*  MG 1.9  --   AST  --  55*  ALT  --  30  ALKPHOS  --  39  BILITOT  --  3.0*    Cardiac Enzymes No results for input(s): TROPONINI in the last 168 hours. RADIOLOGY:  No results found.  ASSESSMENT AND PLAN:  Earl Ramirez is a 80 y.o. male with a PMH significant for stage IVB prostate cancer, history of bladder outlet obstruction s/p TURP, s/p aortic valve replacement, arthritis, A. fib, COPD, HTN, GERD, neuropathy, sleep apnea, thrombocytopenia. They presented from home to the ED on 06/18/2021 with bilateral lower extremity weakness x 2 days.  At baseline, patient was able to ambulate and perform all of ADLs independently.  Last saw oncology 11/1, Dr. [name].  Has history of metastatic disease seen 01/2021.  Undergoing treatment with Xgeva.  Received Eligard 04/2021.  Patient states that he otherwise feels well  but because he was not able to get out of his chair any longer the past 2 days, he was scared and came to ED. His legs have abnormal sensation and feel heavy. He endorses incontinence of bowel and bladder since onset.  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 Narrowing of the lateral recesses could affect the descending L5 nerves. L3-L4 moderate to severe bilateral neural foraminal narrowing.  L1-L2 and L2-L3 mild left-greater-than-right neural foraminal narrowing. No focal suspicious osseous lesion or evidence of cord compression. Heterogeneous marrow signal, which is nonspecific but can be seen in the setting of diabetes, obesity, tobacco use, or a myeloproliferative disorder. -- Patient seen by physical therapist recommends rehab. Will continue to monitor neuro- symptoms. -- Consider neurosurgery input if sign symptoms worsen--remains stable  history of prostate cancer stage IV undergoing chemotherapy at the cancer center -- patient follows with Dr. [name]  bilateral lower extremity chronic lymphedema -- patient has significant dry skin -- no significant weeping ulcers noted at present.  --Cont unna boot  chronic atrial fibrillation -- heart rate stable. Patient follows with Dr. [name] -- not a candidate for oral anticoagulation due to chronic thrombocytopenia (per cardiology)  chronic thrombocytopenia -- at baseline -- patient's platelet count stays from 35-45 K--chronically  obstructive sleep  apnea -- CPAP nightly  COPD -- stable  Urinary retention/overactive bladder- chronic. Last seen by urology 10/22 - continue home myrbetriq  Procedures: Family communication : brother at bedside [name] Consults :  CODE STATUS: full DVT Prophylaxis : SCD due to thrombocytopenia Level of care: Telemetry Medical Status is: Inpatient  Remains inpatient appropriate because: workup for bilateral lower extremity weakness, physical therapy, TOC for discharge planning   11/16-- per [facility] patient has bed offers. Patient is accepted bed.. Awaiting insurance authorization.  11/17-- Awaiting insurance authorization.    TOTAL TIME TAKING CARE OF THIS PATIENT: 25 minutes.  >50% time spent on counselling and coordination of care  Note: This dictation was prepared with Dragon dictation along with smaller phrase technology. Any transcriptional errors that result from this process are unintentional.  Fritzi Mandes M.D    [facility]   CC: Primary care physician; Baxter Hire, MD Patient ID: Earl Ramirez, male   DOB: 1941-07-07, 80 y.o.   MRN: [id number]

## 2021-06-21 NOTE — Care Management Important Message (Signed)
Important Message  Patient Details  Name: Earl Ramirez MRN: [id number] Date of Birth: 1940/10/25   Medicare Important Message Given:  Yes  Obtained a signature on the initial Important Message from Medicare and will send to HIM for scanning into the patient's medical record.    [name] 06/21/2021, 11:53 AM

## 2021-06-21 NOTE — Progress Notes (Signed)
Physical Therapy Treatment Patient Details Name: Earl Ramirez MRN: [id number] DOB: 06/20/1941 Today's Date: 06/21/2021   History of Present Illness Earl Ramirez is a 80 y.o. male with a PMH significant for stage IVB prostate cancer, history of bladder outlet obstruction s/p TURP, s/p aortic valve replacement, arthritis, A. fib, COPD, HTN, GERD, neuropathy, sleep apnea, thrombocytopenia. He presented from home to the ED on 06/18/2021 with bilateral lower extremity weakness x 2 days.    PT Comments    Pt received sitting up in recliner, feet remained down since OT session this morning per pt. Pt educated on importance of elevating BLE due to lymphedema. Pt motivated and agreeable to participate with therapy. He states he would like to walk this session. Pt was able to ambulate 159ft using RW. No LOB however close CGA provided due to risk of knee buckling. STS assist has decreased to [place]. Pt has shown significant progress over the past few days; he will continue to benefit from skilled PT to address functional endurance and continue progressing towards independence with mobility.    Recommendations for follow up therapy are one component of a multi-disciplinary discharge planning process, led by the attending physician.  Recommendations may be updated based on patient status, additional functional criteria and insurance authorization.  Follow Up Recommendations  Skilled nursing-short term rehab (<3 hours/day)     Assistance Recommended at Discharge Frequent or constant Supervision/Assistance  Equipment Recommendations  None recommended by PT    Recommendations for Other Services       Precautions / Restrictions Precautions Precautions: Fall Restrictions Weight Bearing Restrictions: No     Mobility  Bed Mobility Overal bed mobility: Needs Assistance Bed Mobility: Supine to Sit     Supine to sit: Min assist;Mod assist;HOB elevated     General bed mobility comments: not  assessed - pt in recliner at start and end of session    Transfers Overall transfer level: Needs assistance Equipment used: Rolling walker (2 wheels) Transfers: Sit to/from Stand Sit to Stand: Min guard           General transfer comment: CGA to stand from recliner using BUE support on arm rests. Performed slowly with CGA for safety.    Ambulation/Gait Ambulation/Gait assistance: Min guard Gait Distance (Feet): 120 Feet Assistive device: Rolling walker (2 wheels) Gait Pattern/deviations: Step-through pattern;Trunk flexed       General Gait Details: 167ft using RW, no apparent LOB. Close CGA for safety due to risk of knee buckling. VC to remain close to RW.   Stairs             Wheelchair Mobility    Modified Rankin (Stroke Patients Only)       Balance Overall balance assessment: Needs assistance Sitting-balance support: Feet supported Sitting balance-Leahy Scale: Good     Standing balance support: Bilateral upper extremity supported;During functional activity;Reliant on assistive device for balance Standing balance-Leahy Scale: Fair Standing balance comment: reliant on BUE support of RW during ambulation.                            Cognition Arousal/Alertness: Awake/alert Behavior During Therapy: WFL for tasks assessed/performed Overall Cognitive Status: Within Functional Limits for tasks assessed                                 General Comments: A&O, somewhat slow speech cadence  Exercises Other Exercises Other Exercises: OT engages pt in sup to sit with MIN/MOD A and he demos G static sitting balance. OT then engages pt in seated EOB ADLs including donning socks with MAX A. Pt able to CTS with RW x2 trials with MIN A from slightly elevated EOB surface. OT cues for hand placement/safety and provides rationale education. Pt, with MIN A and RW, is able to take ~5-6 small steps from bed side to recliner adjacent. Pt left in  chair with chair alarm and RN notified. OT leaves pt's LEs down at his request, but encourages him to elevate after ~30 mins and he is agreeable.    General Comments        Pertinent Vitals/Pain Pain Assessment: 0-10 Pain Score: 4  Pain Location: BLE, L worse than R Pain Descriptors / Indicators: Sore;Nagging Pain Intervention(s): Limited activity within patient's tolerance;Monitored during session;Repositioned    Home Living                          Prior Function            PT Goals (current goals can now be found in the care plan section) Acute Rehab PT Goals Patient Stated Goal: go to rehab and begin planning to move into assisted living PT Goal Formulation: With patient Time For Goal Achievement: 07/03/21 Potential to Achieve Goals: Fair    Frequency    Min 2X/week      PT Plan      Co-evaluation              AM-PAC PT "6 Clicks" Mobility   Outcome Measure  Help needed turning from your back to your side while in a flat bed without using bedrails?: A Little Help needed moving from lying on your back to sitting on the side of a flat bed without using bedrails?: A Little Help needed moving to and from a bed to a chair (including a wheelchair)?: A Little Help needed standing up from a chair using your arms (e.g., wheelchair or bedside chair)?: A Little Help needed to walk in hospital room?: A Little Help needed climbing 3-5 steps with a railing? : A Lot 6 Click Score: 17    End of Session Equipment Utilized During Treatment: Gait belt Activity Tolerance: Patient tolerated treatment well Patient left: in chair;with chair alarm set;with call bell/phone within reach Nurse Communication: Mobility status PT Visit Diagnosis: Unsteadiness on feet (R26.81);Muscle weakness (generalized) (M62.81);History of falling (Z91.81);Difficulty in walking, not elsewhere classified (R26.2)     Time: 6333-5456 PT Time Calculation (min) (ACUTE ONLY): 25  min  Charges:  $Gait Training: 8-22 mins $Therapeutic Activity: 8-22 mins                     Patrina Levering PT, DPT 06/21/21 4:21 PM [phone]

## 2021-06-21 NOTE — Progress Notes (Signed)
[facility]) Hospital Liaison note:  This is a pending outpatient-based Palliative Care patient. Will continue to follow for disposition.  Please call with any outpatient palliative questions or concerns.  Thank you, Lorelee Market, LPN [facility] Liaison [phone]

## 2021-06-21 NOTE — TOC Progression Note (Signed)
Transition of Care [facility]) - Progression Note    Patient Details  Name: NYSIR FERGUSSON MRN: [id number] Date of Birth: [birth year]  Transition of Care [facility]) CM/SW Contact  Shelbie Hutching, RN Phone Number: 06/21/2021, 4:32 PM  Clinical Narrative:    Insurance auth still pending.  [facility] can accept when auth approved.     Expected Discharge Plan: [place] Barriers to Discharge: Continued Medical Work up  Expected Discharge Plan and Services Expected Discharge Plan: [place]   Discharge Planning Services: CM Consult Post Acute Care Choice: [place] Living arrangements for the past 2 months: Single Family Home                 DME Arranged: N/A DME Agency: NA       HH Arranged: NA HH Agency: NA         Social Determinants of Health (SDOH) Interventions    Readmission Risk Interventions No flowsheet data found.

## 2021-06-22 MED ORDER — CEPHALEXIN 500 MG PO CAPS: 500.0000 mg | ORAL_CAPSULE | Freq: Two times a day (BID) | ORAL | 0 refills | Status: AC

## 2021-06-22 NOTE — TOC Progression Note (Signed)
Transition of Care [facility]) - Progression Note    Patient Details  Name: Earl Ramirez MRN: [id number] Date of Birth: Sep 21, 1940  Transition of Care [facility]) CM/SW [place], RN Phone Number: 06/22/2021, 11:56 AM  Clinical Narrative: Insurance authorization requires Peer to Peer [phone]. Medical viewer recommends Custodial level of care. Attending notified.      Expected Discharge Plan: [place] Barriers to Discharge: Continued Medical Work up  Expected Discharge Plan and Services Expected Discharge Plan: [place]   Discharge Planning Services: CM Consult Post Acute Care Choice: [place] Living arrangements for the past 2 months: Single Family Home                 DME Arranged: N/A DME Agency: NA       HH Arranged: NA HH Agency: NA         Social Determinants of Health (SDOH) Interventions    Readmission Risk Interventions No flowsheet data found.

## 2021-06-22 NOTE — Discharge Summary (Addendum)
[place] [facility] [place] NAME: Earl Ramirez    MR#:  [id number]  DATE OF BIRTH:  [birth year]  DATE OF ADMISSION:  06/18/2021 ADMITTING PHYSICIAN: Richarda Osmond, MD  DATE OF DISCHARGE: 06/22/2021  PRIMARY CARE PHYSICIAN: Baxter Hire, MD    ADMISSION DIAGNOSIS:  Portal hypertension ([place]) [K76.6] Bilirubinemia [E80.6] Weakness [R53.1] Acute cystitis with hematuria [N30.01] Cellulitis, unspecified cellulitis site [L03.90] Metastatic malignant neoplasm, unspecified site ([place]) [C79.9]  DISCHARGE DIAGNOSIS:  acute cystitis/UTI bilateral lower extremity chronic lymphedema generalized weakness H/o Prostate cancer stage IV SECONDARY DIAGNOSIS:   Past Medical History:  Diagnosis Date   Aortic valve replaced    1998   Arthritis    Atrial fibrillation ([place])    COPD (chronic obstructive pulmonary disease) ([place])    Dysrhythmia    atrial fib   Hernia of abdominal wall    Hypertension    Indigestion    Neuropathy    Neuropathy    Prostate cancer ([place])    Sleep apnea    Thrombocytopenia (H[facility])    Tremors of nervous system    Urinary problem     HOSPITAL COURSE:   Earl Ramirez is a 80 y.o. male with a PMH significant for stage IVB prostate cancer, history of bladder outlet obstruction s/p TURP, s/p aortic valve replacement, arthritis, A. fib, COPD, HTN, GERD, neuropathy, sleep apnea, thrombocytopenia. They presented from home to the ED on 06/18/2021 with bilateral lower extremity weakness x 2 days.  At baseline, patient was able to ambulate and perform all of ADLs independently.  Last saw oncology 11/1, Dr. [name].  Has history of metastatic disease seen 01/2021.  Undergoing treatment with Xgeva.  Received Eligard 04/2021.   Patient states that he otherwise feels well but because he was not able to get out of his chair any longer the past 2 days, he was scared and came to ED. His legs have abnormal sensation and feel heavy. He  endorses incontinence of bowel and bladder since onset.   Bilateral lower extremity weakness UTI/bacteriuria -- urine culture positive for 40,000 Proteus mirabilis, >100K Staphylococcus Epidermidis -- IV Rocephin x 3 days since patient has history of prostate cancer ---change to po kelfex (7 days) -- blood culture negative -- urine culture staph epidermidis and Proteus mirabilis -- patient denies any stool or urinary incontinence -- physical therapy, occupational therapy--- recommends rehab. Patient ambulated 120 feet with minimum to moderate assist. Did peer to peer review with patient's insurance MD and rehab was denied. This was discussed with patient and he is agreeable to go home with home health   Multilevel DJD --MRI lumbar spine L4-L5 moderate spinal canal stenosis and severe bilateral neural foraminal narrowing. Narrowing of the lateral recesses could affect the descending L5 nerves. L3-L4 moderate to severe bilateral neural foraminal narrowing.  L1-L2 and L2-L3 mild left-greater-than-right neural foraminal narrowing. No focal suspicious osseous lesion or evidence of cord compression. Heterogeneous marrow signal, which is nonspecific but can be seen in the setting of diabetes, obesity, tobacco use, or a myeloproliferative disorder. -- Patient seen by physical therapist recommends rehab. Will continue to monitor neuro- symptoms.  history of prostate cancer stage IV undergoing chemotherapy at the cancer center -- patient follows with Dr. [name]   bilateral lower extremity chronic lymphedema -- patient has significant dry skin -- no significant weeping ulcers noted at present.  --Cont unna boot   chronic atrial fibrillation -- heart rate stable. Patient follows with Dr. [name] --  not a candidate for oral anticoagulation due to chronic thrombocytopenia (per cardiology)   chronic thrombocytopenia -- at baseline -- patient's platelet count stays from 35-45 K--chronically    obstructive sleep apnea -- CPAP nightly   COPD -- stable   Urinary retention/overactive bladder- chronic. Last seen by urology 10/22 - continue home myrbetriq   Procedures: Family communication : none today  consults :  CODE STATUS: full DVT Prophylaxis : SCD due to thrombocytopenia  patient will discharged to home with home health. CONSULTS OBTAINED:    DRUG ALLERGIES:   Allergies  Allergen Reactions   Oxycodone-Acetaminophen Nausea Only and Shortness Of Breath   Oxycodone-Acetaminophen     Other reaction(s): Drowsy   Amoxicillin Itching    Hand itching   Penicillins Rash    Did it involve swelling of the face/tongue/throat, SOB, or low BP? No Did it involve sudden or severe rash/hives, skin peeling, or any reaction on the inside of your mouth or nose? No Did you need to seek medical attention at a hospital or doctor's office? No When did it last happen?      4-5 years ago If all above answers are "NO", may proceed with cephalosporin use.     DISCHARGE MEDICATIONS:   Allergies as of 06/22/2021       Reactions   Oxycodone-acetaminophen Nausea Only, Shortness Of Breath   Oxycodone-acetaminophen    Other reaction(s): Drowsy   Amoxicillin Itching   Hand itching   Penicillins Rash   Did it involve swelling of the face/tongue/throat, SOB, or low BP? No Did it involve sudden or severe rash/hives, skin peeling, or any reaction on the inside of your mouth or nose? No Did you need to seek medical attention at a hospital or doctor's office? No When did it last happen?      4-5 years ago If all above answers are "NO", may proceed with cephalosporin use.        Medication List     TAKE these medications    ALPRAZolam 0.25 MG tablet Commonly known as: XANAX Take 0.25 mg by mouth daily as needed for anxiety.   aspirin EC 81 MG tablet Take 81 mg by mouth daily.   calcium carbonate 500 MG chewable tablet Commonly known as: TUMS - dosed in mg elemental  calcium Chew 1 tablet by mouth daily.   cephALEXin 500 MG capsule Commonly known as: KEFLEX Take 1 capsule (500 mg total) by mouth every 12 (twelve) hours for 3 days.   furosemide 20 MG tablet Commonly known as: LASIX Take 20 mg by mouth daily.   gabapentin 300 MG capsule Commonly known as: NEURONTIN Take 900 mg by mouth 4 (four) times daily.   metoprolol succinate 25 MG 24 hr tablet Commonly known as: TOPROL-XL Take 25 mg by mouth daily.   mirabegron ER 50 MG Tb24 tablet Commonly known as: MYRBETRIQ Take 1 tablet (50 mg total) by mouth daily.   tamsulosin 0.4 MG Caps capsule Commonly known as: FLOMAX Take 1 capsule by mouth twice daily   Xtandi 40 MG tablet Generic drug: enzalutamide Take 4 tablets (160 mg total) by mouth daily.        If you experience worsening of your admission symptoms, develop shortness of breath, life threatening emergency, suicidal or homicidal thoughts you must seek medical attention immediately by calling 911 or calling your MD immediately  if symptoms less severe.  You Must read complete instructions/literature along with all the possible adverse reactions/side effects for all the  Medicines you take and that have been prescribed to you. Take any new Medicines after you have completely understood and accept all the possible adverse reactions/side effects.   Please note  You were cared for by a hospitalist during your hospital stay. If you have any questions about your discharge medications or the care you received while you were in the hospital after you are discharged, you can call the unit and asked to speak with the hospitalist on call if the hospitalist that took care of you is not available. Once you are discharged, your primary care physician will handle any further medical issues. Please note that NO REFILLS for any discharge medications will be authorized once you are discharged, as it is imperative that you return to your primary care  physician (or establish a relationship with a primary care physician if you do not have one) for your aftercare needs so that they can reassess your need for medications and monitor your lab values. Today   SUBJECTIVE   no new complaints  VITAL SIGNS:  Blood pressure (!) 117/57, pulse 76, temperature 98.6 F (37 C), resp. rate 18, height 5\' 7"  (1.702 m), weight 108 kg, SpO2 99 %.  I/O:   Intake/Output Summary (Last 24 hours) at 06/22/2021 1213 Last data filed at 06/22/2021 0434 Gross per 24 hour  Intake --  Output 800 ml  Net -800 ml    PHYSICAL EXAMINATION:  GENERAL:  80 y.o.-year-old patient lying in the bed with no acute distress.  HEENT: Head atraumatic, normocephalic. Oropharynx and nasopharynx clear.  LUNGS: Normal breath sounds bilaterally, no wheezing, rales, rhonchi. No use of accessory muscles of respiration.  CARDIOVASCULAR: S1, S2 normal. No murmurs, rubs, or gallops.  ABDOMEN: Soft, nontender, nondistended. Bowel sounds present. No organomegaly or mass.  EXTREMITIES:  NEUROLOGIC: nonfocal PSYCHIATRIC:  patient is alert and oriented x 3.  SKIN:  DATA REVIEW:   CBC  Recent Labs  Lab 06/17/21 1933  WBC 3.9*  HGB 13.4  HCT 39.3  PLT 41*    Chemistries  Recent Labs  Lab 06/18/21 0652 06/19/21 0618  NA  --  142  K  --  4.0  CL  --  109  CO2  --  26  GLUCOSE  --  134*  BUN  --  24*  CREATININE  --  0.56*  CALCIUM  --  7.9*  MG 1.9  --   AST  --  55*  ALT  --  30  ALKPHOS  --  39  BILITOT  --  3.0*    Microbiology Results   Recent Results (from the past 240 hour(s))  Urine Culture     Status: Abnormal   Collection Time: 06/18/21  5:45 AM   Specimen: In/Out Cath Urine  Result Value Ref Range Status   Specimen Description   Final    IN/OUT CATH URINE Performed at [facility], [address] [place], [place] [address]    Special Requests   Final    NONE Performed at [facility], [address] [place], [place]  [address]    Culture (A)  Final    40,000 COLONIES/mL PROTEUS MIRABILIS >=100,000 COLONIES/mL STAPHYLOCOCCUS EPIDERMIDIS    Report Status 06/20/2021 FINAL  Final   Organism ID, Bacteria PROTEUS MIRABILIS (A)  Final   Organism ID, Bacteria STAPHYLOCOCCUS EPIDERMIDIS (A)  Final      Susceptibility   Proteus mirabilis - MIC*    AMPICILLIN <=2 SENSITIVE Sensitive  CEFAZOLIN <=4 SENSITIVE Sensitive     CEFEPIME <=0.12 SENSITIVE Sensitive     CEFTRIAXONE <=0.25 SENSITIVE Sensitive     CIPROFLOXACIN <=0.25 SENSITIVE Sensitive     GENTAMICIN <=1 SENSITIVE Sensitive     IMIPENEM 2 SENSITIVE Sensitive     NITROFURANTOIN RESISTANT Resistant     TRIMETH/SULFA 160 RESISTANT Resistant     AMPICILLIN/SULBACTAM <=2 SENSITIVE Sensitive     PIP/TAZO <=4 SENSITIVE Sensitive     * 40,000 COLONIES/mL PROTEUS MIRABILIS   Staphylococcus epidermidis - MIC*    CIPROFLOXACIN <=0.5 SENSITIVE Sensitive     GENTAMICIN <=0.5 SENSITIVE Sensitive     NITROFURANTOIN <=16 SENSITIVE Sensitive     OXACILLIN <=0.25 SENSITIVE Sensitive     TETRACYCLINE 2 SENSITIVE Sensitive     VANCOMYCIN 1 SENSITIVE Sensitive     TRIMETH/SULFA <=10 SENSITIVE Sensitive     CLINDAMYCIN <=0.25 SENSITIVE Sensitive     RIFAMPIN <=0.5 SENSITIVE Sensitive     Inducible Clindamycin NEGATIVE Sensitive     * >=100,000 COLONIES/mL STAPHYLOCOCCUS EPIDERMIDIS  Resp Panel by RT-PCR (Flu A&B, Covid) Nasopharyngeal Swab     Status: None   Collection Time: 06/18/21  6:40 AM   Specimen: Nasopharyngeal Swab; Nasopharyngeal(NP) swabs in vial transport medium  Result Value Ref Range Status   SARS Coronavirus 2 by RT PCR NEGATIVE NEGATIVE Final    Comment: (NOTE) SARS-CoV-2 target nucleic acids are NOT DETECTED.  The SARS-CoV-2 RNA is generally detectable in upper respiratory specimens during the acute phase of infection. The lowest concentration of SARS-CoV-2 viral copies this assay can detect is 138 copies/mL. A negative result does not  preclude SARS-Cov-2 infection and should not be used as the sole basis for treatment or other patient management decisions. A negative result may occur with  improper specimen collection/handling, submission of specimen other than nasopharyngeal swab, presence of viral mutation(s) within the areas targeted by this assay, and inadequate number of viral copies(<138 copies/mL). A negative result must be combined with clinical observations, patient history, and epidemiological information. The expected result is Negative.  Fact Sheet for Patients:  EntrepreneurPulse.com.au  Fact Sheet for Healthcare Providers:  IncredibleEmployment.be  This test is no t yet approved or cleared by the [place] FDA and  has been authorized for detection and/or diagnosis of SARS-CoV-2 by FDA under an Emergency Use Authorization (EUA). This EUA will remain  in effect (meaning this test can be used) for the duration of the COVID-19 declaration under Section 564(b)(1) of the Act, 21 U.S.C.section 360bbb-3(b)(1), unless the authorization is terminated  or revoked sooner.       Influenza A by PCR NEGATIVE NEGATIVE Final   Influenza B by PCR NEGATIVE NEGATIVE Final    Comment: (NOTE) The Xpert Xpress SARS-CoV-2/FLU/RSV plus assay is intended as an aid in the diagnosis of influenza from Nasopharyngeal swab specimens and should not be used as a sole basis for treatment. Nasal washings and aspirates are unacceptable for Xpert Xpress SARS-CoV-2/FLU/RSV testing.  Fact Sheet for Patients: EntrepreneurPulse.com.au  Fact Sheet for Healthcare Providers: IncredibleEmployment.be  This test is not yet approved or cleared by the [place] FDA and has been authorized for detection and/or diagnosis of SARS-CoV-2 by FDA under an Emergency Use Authorization (EUA). This EUA will remain in effect (meaning this test can be used) for the  duration of the COVID-19 declaration under Section 564(b)(1) of the Act, 21 U.S.C. section 360bbb-3(b)(1), unless the authorization is terminated or revoked.  Performed at [facility], [place],  [place], [place] [address]   Blood culture (routine x 2)     Status: None (Preliminary result)   Collection Time: 06/18/21  8:37 AM   Specimen: BLOOD  Result Value Ref Range Status   Specimen Description BLOOD LAC  Final   Special Requests   Final    BOTTLES DRAWN AEROBIC AND ANAEROBIC Blood Culture adequate volume   Culture   Final    NO GROWTH 4 DAYS Performed at [facility], [address], [place], [place] [address]    Report Status PENDING  Incomplete  Blood culture (routine x 2)     Status: None (Preliminary result)   Collection Time: 06/18/21  8:38 AM   Specimen: BLOOD  Result Value Ref Range Status   Specimen Description BLOOD FOA  Final   Special Requests   Final    BOTTLES DRAWN AEROBIC AND ANAEROBIC Blood Culture results may not be optimal due to an inadequate volume of blood received in culture bottles   Culture   Final    NO GROWTH 4 DAYS Performed at [facility], [address], [place], [place] [address]    Report Status PENDING  Incomplete    RADIOLOGY:  No results found.   CODE STATUS:  Code Status History     Date Active Date Inactive Code Status Order ID Comments User Context   01/18/2019 1543 01/19/2019 1931 Full Code [id number]  [name], MD Inpatient        TOTAL TIME TAKING CARE OF THIS PATIENT: 35 minutes.    Fritzi Mandes M.D  [facility]    CC: Primary care physician; Baxter Hire, MD

## 2021-06-23 LAB — CULTURE, BLOOD (ROUTINE X 2)
Culture: NO GROWTH
Culture: NO GROWTH
Special Requests: ADEQUATE

## 2021-06-27 ENCOUNTER — Other Ambulatory Visit (HOSPITAL_COMMUNITY): Payer: Self-pay

## 2021-06-28 ENCOUNTER — Inpatient Hospital Stay: Payer: PPO

## 2021-06-28 ENCOUNTER — Emergency Department: Payer: PPO

## 2021-06-28 ENCOUNTER — Inpatient Hospital Stay
Admission: EM | Admit: 2021-06-28 | Discharge: 2021-07-02 | DRG: 603 | Disposition: A | Discharge status: Skilled Nursing Facility | Payer: PPO | Attending: Internal Medicine | Admitting: Internal Medicine

## 2021-06-28 ENCOUNTER — Encounter: Payer: Self-pay | Admitting: Emergency Medicine

## 2021-06-28 ENCOUNTER — Other Ambulatory Visit: Payer: Self-pay

## 2021-06-28 DIAGNOSIS — L03119 Cellulitis of unspecified part of limb: Secondary | ICD-10-CM

## 2021-06-28 DIAGNOSIS — L03115 Cellulitis of right lower limb: Secondary | ICD-10-CM | POA: Diagnosis not present

## 2021-06-28 DIAGNOSIS — Z7401 Bed confinement status: Secondary | ICD-10-CM | POA: Diagnosis not present

## 2021-06-28 DIAGNOSIS — Z923 Personal history of irradiation: Secondary | ICD-10-CM

## 2021-06-28 DIAGNOSIS — Z8249 Family history of ischemic heart disease and other diseases of the circulatory system: Secondary | ICD-10-CM

## 2021-06-28 DIAGNOSIS — C799 Secondary malignant neoplasm of unspecified site: Secondary | ICD-10-CM | POA: Diagnosis present

## 2021-06-28 DIAGNOSIS — Z811 Family history of alcohol abuse and dependence: Secondary | ICD-10-CM

## 2021-06-28 DIAGNOSIS — L03116 Cellulitis of left lower limb: Secondary | ICD-10-CM | POA: Diagnosis not present

## 2021-06-28 DIAGNOSIS — I890 Lymphedema, not elsewhere classified: Secondary | ICD-10-CM | POA: Diagnosis present

## 2021-06-28 DIAGNOSIS — R600 Localized edema: Secondary | ICD-10-CM | POA: Diagnosis not present

## 2021-06-28 DIAGNOSIS — R2689 Other abnormalities of gait and mobility: Secondary | ICD-10-CM | POA: Diagnosis not present

## 2021-06-28 DIAGNOSIS — R0902 Hypoxemia: Secondary | ICD-10-CM | POA: Diagnosis not present

## 2021-06-28 DIAGNOSIS — I482 Chronic atrial fibrillation, unspecified: Secondary | ICD-10-CM

## 2021-06-28 DIAGNOSIS — C801 Malignant (primary) neoplasm, unspecified: Secondary | ICD-10-CM | POA: Diagnosis not present

## 2021-06-28 DIAGNOSIS — M7989 Other specified soft tissue disorders: Secondary | ICD-10-CM | POA: Diagnosis not present

## 2021-06-28 DIAGNOSIS — M6281 Muscle weakness (generalized): Secondary | ICD-10-CM | POA: Diagnosis not present

## 2021-06-28 DIAGNOSIS — I517 Cardiomegaly: Secondary | ICD-10-CM | POA: Diagnosis not present

## 2021-06-28 DIAGNOSIS — E559 Vitamin D deficiency, unspecified: Secondary | ICD-10-CM | POA: Diagnosis not present

## 2021-06-28 DIAGNOSIS — Z66 Do not resuscitate: Secondary | ICD-10-CM | POA: Diagnosis present

## 2021-06-28 DIAGNOSIS — I959 Hypotension, unspecified: Secondary | ICD-10-CM | POA: Diagnosis not present

## 2021-06-28 DIAGNOSIS — F1010 Alcohol abuse, uncomplicated: Secondary | ICD-10-CM | POA: Diagnosis not present

## 2021-06-28 DIAGNOSIS — R000 Tachycardia, unspecified: Secondary | ICD-10-CM | POA: Diagnosis not present

## 2021-06-28 DIAGNOSIS — R54 Age-related physical debility: Secondary | ICD-10-CM | POA: Diagnosis present

## 2021-06-28 DIAGNOSIS — Z79899 Other long term (current) drug therapy: Secondary | ICD-10-CM

## 2021-06-28 DIAGNOSIS — K766 Portal hypertension: Secondary | ICD-10-CM | POA: Diagnosis not present

## 2021-06-28 DIAGNOSIS — R609 Edema, unspecified: Secondary | ICD-10-CM

## 2021-06-28 DIAGNOSIS — K7030 Alcoholic cirrhosis of liver without ascites: Secondary | ICD-10-CM | POA: Diagnosis not present

## 2021-06-28 DIAGNOSIS — Z8546 Personal history of malignant neoplasm of prostate: Secondary | ICD-10-CM

## 2021-06-28 DIAGNOSIS — Z87440 Personal history of urinary (tract) infections: Secondary | ICD-10-CM

## 2021-06-28 DIAGNOSIS — K7460 Unspecified cirrhosis of liver: Secondary | ICD-10-CM | POA: Diagnosis present

## 2021-06-28 DIAGNOSIS — R251 Tremor, unspecified: Secondary | ICD-10-CM | POA: Diagnosis present

## 2021-06-28 DIAGNOSIS — Z20822 Contact with and (suspected) exposure to covid-19: Secondary | ICD-10-CM | POA: Diagnosis not present

## 2021-06-28 DIAGNOSIS — R471 Dysarthria and anarthria: Secondary | ICD-10-CM | POA: Diagnosis not present

## 2021-06-28 DIAGNOSIS — Z952 Presence of prosthetic heart valve: Secondary | ICD-10-CM | POA: Diagnosis not present

## 2021-06-28 DIAGNOSIS — I10 Essential (primary) hypertension: Secondary | ICD-10-CM | POA: Diagnosis present

## 2021-06-28 DIAGNOSIS — Z6838 Body mass index (BMI) 38.0-38.9, adult: Secondary | ICD-10-CM

## 2021-06-28 DIAGNOSIS — G629 Polyneuropathy, unspecified: Secondary | ICD-10-CM | POA: Diagnosis present

## 2021-06-28 DIAGNOSIS — N320 Bladder-neck obstruction: Secondary | ICD-10-CM | POA: Diagnosis not present

## 2021-06-28 DIAGNOSIS — E669 Obesity, unspecified: Secondary | ICD-10-CM | POA: Diagnosis not present

## 2021-06-28 DIAGNOSIS — M79662 Pain in left lower leg: Secondary | ICD-10-CM | POA: Diagnosis not present

## 2021-06-28 DIAGNOSIS — R5381 Other malaise: Secondary | ICD-10-CM | POA: Diagnosis not present

## 2021-06-28 DIAGNOSIS — D696 Thrombocytopenia, unspecified: Secondary | ICD-10-CM | POA: Diagnosis not present

## 2021-06-28 DIAGNOSIS — Z885 Allergy status to narcotic agent status: Secondary | ICD-10-CM

## 2021-06-28 DIAGNOSIS — L0390 Cellulitis, unspecified: Secondary | ICD-10-CM

## 2021-06-28 DIAGNOSIS — I4891 Unspecified atrial fibrillation: Secondary | ICD-10-CM | POA: Diagnosis not present

## 2021-06-28 DIAGNOSIS — Z880 Allergy status to penicillin: Secondary | ICD-10-CM

## 2021-06-28 DIAGNOSIS — Z9079 Acquired absence of other genital organ(s): Secondary | ICD-10-CM | POA: Diagnosis not present

## 2021-06-28 DIAGNOSIS — I739 Peripheral vascular disease, unspecified: Secondary | ICD-10-CM | POA: Diagnosis present

## 2021-06-28 DIAGNOSIS — R531 Weakness: Secondary | ICD-10-CM | POA: Diagnosis not present

## 2021-06-28 DIAGNOSIS — R52 Pain, unspecified: Secondary | ICD-10-CM | POA: Diagnosis not present

## 2021-06-28 DIAGNOSIS — R1312 Dysphagia, oropharyngeal phase: Secondary | ICD-10-CM | POA: Diagnosis not present

## 2021-06-28 DIAGNOSIS — Z9221 Personal history of antineoplastic chemotherapy: Secondary | ICD-10-CM

## 2021-06-28 DIAGNOSIS — C61 Malignant neoplasm of prostate: Secondary | ICD-10-CM | POA: Diagnosis not present

## 2021-06-28 DIAGNOSIS — Z820 Family history of epilepsy and other diseases of the nervous system: Secondary | ICD-10-CM

## 2021-06-28 DIAGNOSIS — F419 Anxiety disorder, unspecified: Secondary | ICD-10-CM | POA: Diagnosis not present

## 2021-06-28 DIAGNOSIS — K219 Gastro-esophageal reflux disease without esophagitis: Secondary | ICD-10-CM | POA: Diagnosis present

## 2021-06-28 LAB — HEPATIC FUNCTION PANEL
ALT: 30 U/L (ref 0–44)
AST: 38 U/L (ref 15–41)
Albumin: 3.3 g/dL — ABNORMAL LOW (ref 3.5–5.0)
Alkaline Phosphatase: 54 U/L (ref 38–126)
Bilirubin, Direct: 0.5 mg/dL — ABNORMAL HIGH (ref 0.0–0.2)
Indirect Bilirubin: 3.9 mg/dL — ABNORMAL HIGH (ref 0.3–0.9)
Total Bilirubin: 4.4 mg/dL — ABNORMAL HIGH (ref 0.3–1.2)
Total Protein: 5.7 g/dL — ABNORMAL LOW (ref 6.5–8.1)

## 2021-06-28 LAB — BASIC METABOLIC PANEL
Anion gap: 8 (ref 5–15)
BUN: 16 mg/dL (ref 8–23)
CO2: 27 mmol/L (ref 22–32)
Calcium: 8 mg/dL — ABNORMAL LOW (ref 8.9–10.3)
Chloride: 101 mmol/L (ref 98–111)
Creatinine, Ser: 0.88 mg/dL (ref 0.61–1.24)
GFR, Estimated: >60 mL/min (ref >60)
Glucose, Bld: 127 mg/dL — ABNORMAL HIGH (ref 70–99)
Potassium: 4.1 mmol/L (ref 3.5–5.1)
Sodium: 136 mmol/L (ref 135–145)

## 2021-06-28 LAB — URINALYSIS, ROUTINE W REFLEX MICROSCOPIC
Glucose, UA: NEGATIVE mg/dL
Hgb urine dipstick: NEGATIVE
Ketones, ur: NEGATIVE mg/dL
Leukocytes,Ua: NEGATIVE
Nitrite: NEGATIVE
Protein, ur: 30 mg/dL — AB
Specific Gravity, Urine: 1.025 (ref 1.005–1.030)
pH: 5.5 (ref 5.0–8.0)

## 2021-06-28 LAB — CBC
HCT: 38.4 % — ABNORMAL LOW (ref 39.0–52.0)
Hemoglobin: 13.2 g/dL (ref 13.0–17.0)
MCH: 35.9 pg — ABNORMAL HIGH (ref 26.0–34.0)
MCHC: 34.4 g/dL (ref 30.0–36.0)
MCV: 104.3 fL — ABNORMAL HIGH (ref 80.0–100.0)
Platelets: 49 10*3/uL — ABNORMAL LOW (ref 150–400)
RBC: 3.68 MIL/uL — ABNORMAL LOW (ref 4.22–5.81)
RDW: 15 % (ref 11.5–15.5)
WBC: 11.1 10*3/uL — ABNORMAL HIGH (ref 4.0–10.5)
nRBC: 0 % (ref 0.0–0.2)

## 2021-06-28 LAB — CK: Total CK: 311 U/L (ref 49–397)

## 2021-06-28 LAB — RESP PANEL BY RT-PCR (FLU A&B, COVID) ARPGX2
Influenza A by PCR: NEGATIVE
Influenza B by PCR: NEGATIVE
SARS Coronavirus 2 by RT PCR: NEGATIVE

## 2021-06-28 LAB — BRAIN NATRIURETIC PEPTIDE: B Natriuretic Peptide: 100.6 pg/mL — ABNORMAL HIGH (ref 0.0–100.0)

## 2021-06-28 LAB — TROPONIN I (HIGH SENSITIVITY): Troponin I (High Sensitivity): 14 ng/L (ref <18)

## 2021-06-28 MED ORDER — SODIUM CHLORIDE 0.9% FLUSH
3.0000 mL | Freq: Two times a day (BID) | INTRAVENOUS | Status: DC
  Administered 2021-06-28 – 2021-07-02 (×7): 3 mL via INTRAVENOUS

## 2021-06-28 MED ORDER — SODIUM CHLORIDE 0.9 % IV SOLN
2.0000 g | Freq: Three times a day (TID) | INTRAVENOUS | Status: DC
  Administered 2021-06-28 – 2021-07-01 (×8): 2 g via INTRAVENOUS
  Filled 2021-06-28 (×11): qty 2

## 2021-06-28 MED ORDER — SODIUM CHLORIDE 0.9% FLUSH: 3.0000 mL | INTRAVENOUS | Status: DC | PRN

## 2021-06-28 MED ORDER — SODIUM CHLORIDE 0.9 % IV SOLN
250.0000 mL | INTRAVENOUS | Status: DC | PRN
  Administered 2021-06-28: 250 mL via INTRAVENOUS

## 2021-06-28 MED ORDER — MIRABEGRON ER 50 MG PO TB24
50.0000 mg | ORAL_TABLET | Freq: Every day | ORAL | Status: DC
  Administered 2021-06-28 – 2021-07-02 (×5): 50 mg via ORAL
  Filled 2021-06-28 (×5): qty 1

## 2021-06-28 MED ORDER — ONDANSETRON HCL 4 MG PO TABS: 4.0000 mg | ORAL_TABLET | Freq: Four times a day (QID) | ORAL | Status: DC | PRN

## 2021-06-28 MED ORDER — ENZALUTAMIDE 40 MG PO TABS
160.0000 mg | ORAL_TABLET | Freq: Every day | ORAL | Status: DC
  Administered 2021-06-28 – 2021-07-02 (×5): 160 mg via ORAL
  Filled 2021-06-28 (×5): qty 4

## 2021-06-28 MED ORDER — TAMSULOSIN HCL 0.4 MG PO CAPS
0.4000 mg | ORAL_CAPSULE | Freq: Two times a day (BID) | ORAL | Status: DC
  Administered 2021-06-28 – 2021-07-02 (×8): 0.4 mg via ORAL
  Filled 2021-06-28 (×8): qty 1

## 2021-06-28 MED ORDER — ALPRAZOLAM 0.25 MG PO TABS
0.2500 mg | ORAL_TABLET | Freq: Every day | ORAL | Status: DC | PRN
  Administered 2021-06-28 – 2021-07-01 (×3): 0.25 mg via ORAL
  Filled 2021-06-28 (×3): qty 1

## 2021-06-28 MED ORDER — SODIUM CHLORIDE 0.9 % IV SOLN
2.0000 g | Freq: Once | INTRAVENOUS | Status: AC
  Administered 2021-06-28: 2 g via INTRAVENOUS
  Filled 2021-06-28: qty 2

## 2021-06-28 MED ORDER — ASPIRIN EC 81 MG PO TBEC
81.0000 mg | DELAYED_RELEASE_TABLET | Freq: Every day | ORAL | Status: DC
  Administered 2021-06-28 – 2021-07-02 (×5): 81 mg via ORAL
  Filled 2021-06-28 (×5): qty 1

## 2021-06-28 MED ORDER — FUROSEMIDE 40 MG PO TABS: 20.0000 mg | ORAL_TABLET | Freq: Every day | ORAL | Status: DC

## 2021-06-28 MED ORDER — VANCOMYCIN HCL IN DEXTROSE 1-5 GM/200ML-% IV SOLN
1000.0000 mg | Freq: Once | INTRAVENOUS | Status: AC
  Administered 2021-06-28: 1000 mg via INTRAVENOUS
  Filled 2021-06-28: qty 200

## 2021-06-28 MED ORDER — FUROSEMIDE 10 MG/ML IJ SOLN
60.0000 mg | Freq: Once | INTRAMUSCULAR | Status: AC
  Administered 2021-06-28: 60 mg via INTRAVENOUS
  Filled 2021-06-28: qty 8

## 2021-06-28 MED ORDER — VANCOMYCIN HCL 1500 MG/300ML IV SOLN
1500.0000 mg | INTRAVENOUS | Status: DC
  Administered 2021-06-28 – 2021-06-30 (×3): 1500 mg via INTRAVENOUS
  Filled 2021-06-28 (×4): qty 300

## 2021-06-28 MED ORDER — FUROSEMIDE 20 MG PO TABS
20.0000 mg | ORAL_TABLET | Freq: Every day | ORAL | Status: DC
  Administered 2021-06-29 – 2021-07-02 (×4): 20 mg via ORAL
  Filled 2021-06-28 (×4): qty 1

## 2021-06-28 MED ORDER — GABAPENTIN 300 MG PO CAPS
300.0000 mg | ORAL_CAPSULE | Freq: Three times a day (TID) | ORAL | Status: DC
  Administered 2021-06-28: 300 mg via ORAL
  Filled 2021-06-28: qty 1

## 2021-06-28 MED ORDER — GABAPENTIN 300 MG PO CAPS
900.0000 mg | ORAL_CAPSULE | Freq: Four times a day (QID) | ORAL | Status: DC
  Administered 2021-06-28 – 2021-07-02 (×16): 900 mg via ORAL
  Filled 2021-06-28 (×16): qty 3

## 2021-06-28 MED ORDER — CALCIUM CARBONATE ANTACID 500 MG PO CHEW
1.0000 | CHEWABLE_TABLET | Freq: Every day | ORAL | Status: DC
  Administered 2021-06-28 – 2021-07-02 (×5): 200 mg via ORAL
  Filled 2021-06-28 (×5): qty 1

## 2021-06-28 MED ORDER — METOPROLOL SUCCINATE ER 25 MG PO TB24
25.0000 mg | ORAL_TABLET | Freq: Every day | ORAL | Status: DC
  Administered 2021-06-30 – 2021-07-02 (×2): 25 mg via ORAL
  Filled 2021-06-28 (×5): qty 1

## 2021-06-28 MED ORDER — ONDANSETRON HCL 4 MG/2ML IJ SOLN: 4.0000 mg | Freq: Four times a day (QID) | INTRAMUSCULAR | Status: DC | PRN

## 2021-06-28 NOTE — ED Triage Notes (Signed)
Pt reports he woke up pretty early to go to the bathroom and he could not get out of the chair. Pt states he went to sleep in his office chair last pm. Pt states he tried and tried but could not get up. Pt also reports the blisters in his legs have popped and liquid has drained out.

## 2021-06-28 NOTE — ED Provider Notes (Signed)
[facility] Emergency Department Provider Note  Time seen: 12:40 PM  I have reviewed the triage vital signs and the nursing notes.   HISTORY  Chief Complaint Weakness   HPI Earl Ramirez is a 80 y.o. male with a past medical history of stage IVB prostate cancer, history of bladder outlet obstruction s/p TURP, s/p aortic valve replacement, arthritis, A. fib, COPD, HTN, GERD, neuropathy, sleep apnea, thrombocytopenia who presents to the emergency department for generalized weakness unable to ambulate, increased lower extremity edema and redness.   The patient was discharged from the hospital approximately 6 days ago, states he was doing fairly well at home however began feeling very weak yesterday and today was unable to get out of bed, unable to ambulate.  Patient states he has noticed increased swelling redness and now weepage from the lower extremities.  Denies any known fever.  No shortness of breath.  Past Medical History:  Diagnosis Date   Aortic valve replaced    1998   Arthritis    Atrial fibrillation (HCC)    COPD (chronic obstructive pulmonary disease) (HCC)    Dysrhythmia    atrial fib   Hernia of abdominal wall    Hypertension    Indigestion    Neuropathy    Neuropathy    Prostate cancer ([place])    Sleep apnea    Thrombocytopenia (HCC)    Tremors of nervous system    Urinary problem     Patient Active Problem List   Diagnosis Date Noted   Acute cystitis with hematuria    Osteoarthritis of lumbar spine    Lower extremity weakness 06/18/2021   Thyroid nodule 06/18/2021   A-fib ([place]) 06/18/2021   Primary hypertension 06/18/2021   Other cirrhosis of liver ([place]) 06/18/2021   Weakness 06/18/2021   Metastatic malignant neoplasm ([place])    Portal hypertension ([place])    Vitamin D deficiency 03/27/2020   Chronic venous insufficiency 03/23/2020   PAD (peripheral artery disease) ([place]) 03/23/2020   Lymphedema 03/23/2020   COPD (chronic obstructive  pulmonary disease) ([place]) 12/17/2019   Idiopathic peripheral neuropathy 12/17/2019   Sleep apnea 12/17/2019   Splenomegaly 12/17/2019   Aortic valve disease 06/08/2018   Bilateral leg edema 03/19/2017   Bilateral chronic knee pain 02/12/2016   Bilateral leg pain 02/12/2016   Incomplete emptying of bladder 04/07/2015   Chronic atrial flutter ([place]) 01/30/2015   SOB (shortness of breath) 12/07/2014   Thrombocytopenia ([place]) 12/02/2014   Prostate cancer ([place]) 11/22/2014   Pyuria 10/06/2014   Gross hematuria 10/04/2014   Urinary retention 10/04/2014    Past Surgical History:  Procedure Laterality Date   CALDWELL LUC     tongue      growth removed   TRANSURETHRAL RESECTION OF PROSTATE N/A 01/18/2019   Procedure: CHANNEL TRANSURETHRAL RESECTION OF THE PROSTATE (TURP);  Surgeon: [name], MD;  Location: [facility] ORS;  Service: Urology;  Laterality: N/A;   VALVE REPLACEMENT      Prior to Admission medications   Medication Sig Start Date End Date Taking? Authorizing Provider  ALPRAZolam Duanne Moron) 0.25 MG tablet Take 0.25 mg by mouth daily as needed for anxiety.     [provider]  aspirin EC 81 MG tablet Take 81 mg by mouth daily.     [provider]  calcium carbonate (TUMS - DOSED IN MG ELEMENTAL CALCIUM) 500 MG chewable tablet Chew 1 tablet by mouth daily.     [provider]  enzalutamide Gillermina Phy) 40 MG  tablet Take 4 tablets (160 mg total) by mouth daily. 04/30/21   Lloyd Huger, MD  furosemide (LASIX) 20 MG tablet Take 20 mg by mouth daily. 05/16/20   [provider]  gabapentin (NEURONTIN) 300 MG capsule Take 900 mg by mouth 4 (four) times daily.  09/22/19   [provider]  metoprolol succinate (TOPROL-XL) 25 MG 24 hr tablet Take 25 mg by mouth daily.  05/10/19   [provider]  mirabegron ER (MYRBETRIQ) 50 MG TB24 tablet Take 1 tablet (50 mg total) by mouth daily. 04/26/21   [name], MD  tamsulosin [facility]) 0.4 MG  CAPS capsule Take 1 capsule by mouth twice daily 04/16/21   [name], MD    Allergies  Allergen Reactions   Oxycodone-Acetaminophen Nausea Only and Shortness Of Breath   Oxycodone-Acetaminophen     Other reaction(s): Drowsy   Amoxicillin Itching    Hand itching   Penicillins Rash    Did it involve swelling of the face/tongue/throat, SOB, or low BP? No Did it involve sudden or severe rash/hives, skin peeling, or any reaction on the inside of your mouth or nose? No Did you need to seek medical attention at a hospital or doctor's office? No When did it last happen?      4-5 years ago If all above answers are "NO", may proceed with cephalosporin use.     Family History  Problem Relation Age of Onset   Heart attack Father    Alcohol abuse Father    Congestive Heart Failure Mother    Alzheimer's disease Sister     Social History Social History   Tobacco Use   Smoking status: Former    Types: Cigarettes    Quit date: 01/13/2001    Years since quitting: 20.4   Smokeless tobacco: Former    Quit date: 09/24/2000  Vaping Use   Vaping Use: Never used  Substance Use Topics   Alcohol use: Yes    Alcohol/week: 10.0 standard drinks    Types: 10 Cans of beer per week   Drug use: No    Review of Systems Constitutional: Negative for fever.  Positive for generalized fatigue/weakness unable to ambulate today. Cardiovascular: Negative for chest pain. Respiratory: Negative for shortness of breath. Gastrointestinal: Negative for abdominal pain, vomiting and diarrhea. Genitourinary: Negative for urinary compaints Musculoskeletal: Increased swelling in lower extremities now with weepage and redness Skin: Redness of the lower extremities now with weepage Neurological: Negative for headache All other ROS negative  ____________________________________________   PHYSICAL EXAM:  VITAL SIGNS: ED Triage Vitals  Enc Vitals Group     BP 06/28/21 0815 (!) 144/54     Pulse Rate  06/28/21 0815 100     Resp 06/28/21 0815 19     Temp 06/28/21 0815 97.9 F (36.6 C)     Temp Source 06/28/21 0815 Oral     SpO2 06/28/21 0815 100 %     Weight 06/28/21 0814 248 lb (112.5 kg)     Height 06/28/21 0814 5\' 7"  (1.702 m)     Head Circumference --      Peak Flow --      Pain Score 06/28/21 0814 6     Pain Loc --      Pain Edu? --      Excl. in [place]? --    Constitutional: Awake alert, no acute distress. Eyes: Normal exam ENT      Head: Normocephalic and atraumatic.      Mouth/Throat:  Mucous membranes are moist. Cardiovascular: Normal rate, regular rhythm.  Respiratory: Normal respiratory effort without tachypnea nor retractions. Breath sounds are clear  Gastrointestinal: Soft and nontender. No distention.  Musculoskeletal: Significant peripheral edema bilateral lower extremities with bullae and weepage along with erythema and tenderness. Neurologic:  Normal speech and language.  Skin:  Skin is warm, erythema of the lower extremities bilaterally.  Bullae with weepage lower extremities bilaterally. Psychiatric: Mood and affect are normal. Speech and behavior are normal.   ____________________________________________    EKG  EKG viewed and interpreted by myself shows sinus tachycardia 101 bpm with a narrow QRS, normal axis, slight QTC prolongation otherwise normal intervals with nonspecific ST changes without ST elevation.  Electrical interference present.  ____________________________________________    RADIOLOGY  Chest x-ray shows cardiomegaly.  ____________________________________________   INITIAL IMPRESSION / ASSESSMENT AND PLAN / ED COURSE  Pertinent labs & imaging results that were available during my care of the patient were reviewed by me and considered in my medical decision making (see chart for details).   Patient presents emergency department for generalized weakness fatigue lower extremity swelling weepage and redness.  Exam most consistent with  peripheral edema leading to likely cellulitis bilaterally.  Patient was finishing his Keflex however states it has been worsening since going home.  Will cover with IV vancomycin we will send blood cultures and start the patient on IV Lasix.  Basic labs are largely at baseline slight leukocytosis of 11,100.  We will check a COVID swab as well as a troponin.  We will also check a urine sample.  However given the patient's significant peripheral edema with weepage and tenderness as well as erythema I suspect cellulitis and will likely require admission to the hospitalist service given his inability to ambulate.    Earl Ramirez was evaluated in Emergency Department on 06/28/2021 for the symptoms described in the history of present illness. He was evaluated in the context of the global COVID-19 pandemic, which necessitated consideration that the patient might be at risk for infection with the SARS-CoV-2 virus that causes COVID-19. Institutional protocols and algorithms that pertain to the evaluation of patients at risk for COVID-19 are in a state of rapid change based on information released by regulatory bodies including the CDC and federal and state organizations. These policies and algorithms were followed during the patient's care in the ED.  ____________________________________________   FINAL CLINICAL IMPRESSION(S) / ED DIAGNOSES  Weakness Cellulitis Peripheral edema   Harvest Dark, MD 06/28/21 1340

## 2021-06-28 NOTE — ED Notes (Signed)
Reina RN aware of assigned bed 

## 2021-06-28 NOTE — ED Triage Notes (Signed)
Pt in via EMS from home with c/o leg pain EMS reports pt was seen here recently for same complaint. Pt reports was not able to get up today due to weakness and increased swelling with lymphedema. Pt is a cancer patient. Pt was sitting in a chair at a desk. HR 103, 100% RA, 120/54, 98.8 temp

## 2021-06-28 NOTE — Progress Notes (Signed)
   06/28/21 1805  Clinical Encounter Type  Visited With Patient  Visit Type Initial;Spiritual support;Social support  Referral From Other (Comment) (rounding)  Spiritual Encounters  Spiritual Needs Other (Comment) (social support)  Chaplain [name] engaged Pt in hallway. Assisted him with water and provided active listening and compassionate presence. Pt shared his faith and [name] validated his views; offered prayer and support.

## 2021-06-28 NOTE — ED Notes (Signed)
IP bed was taken away due to staffing issues.

## 2021-06-28 NOTE — ED Notes (Signed)
See triage note. Bilateral lower legs are extremely swollen with weeping blisters and vascular congestion present. Pt states he did not realize "how bad they were". Pt resting in bed in NAD.

## 2021-06-28 NOTE — Progress Notes (Signed)
Pharmacy Antibiotic Note  Earl Ramirez is a 80 y.o. male admitted on 06/28/2021 with cellulitis. PMH chronic lymphedema, aortic valve replacement (1998), atrial fibrillation, COPD, HTN, neuropathy and recent discharge from hospital for UTI and lower extremity cellulitis. Pharmacy has been consulted for vancomycin and cefepime dosing.  Presented to ED with increased redness, swelling, bullous lesions some of which have deroofed and draining serous fluid. Obtaining lower extremity doppler to r/o DVT.  WBC 11.1, afebrile. Renal function stable and c/w BL. Received vancomycin 1,000 mg x 1 and cefepime 2 grams x 1 in the ED.  Noted penicillin allergy, though patient has tolerated cephalosporins in the more recent past.  Plan: Give vancomycin 1500 mg now to complete loading dose Maintenance dose vancomycin 1500 mg every 24 hours AUC 400-600 Calculated AUC 488.2 Cmin 10.7  Cefepime 2 grams every 8 hours  Monitor renal function, clinical course and LOT.  Height: 5\' 7"  (170.2 cm) Weight: 112.5 kg (248 lb) IBW/kg (Calculated) : 66.1  Temp (24hrs), Avg:98 F (36.7 C), Min:97.9 F (36.6 C), Max:98 F (36.7 C)  Recent Labs  Lab 06/28/21 0818  WBC 11.1*  CREATININE 0.88    Estimated Creatinine Clearance: 77.5 mL/min (by C-G formula based on SCr of 0.88 mg/dL).    Allergies  Allergen Reactions   Oxycodone-Acetaminophen Nausea Only and Shortness Of Breath   Oxycodone-Acetaminophen     Other reaction(s): Drowsy   Amoxicillin Itching    Hand itching   Penicillins Rash    Did it involve swelling of the face/tongue/throat, SOB, or low BP? No Did it involve sudden or severe rash/hives, skin peeling, or any reaction on the inside of your mouth or nose? No Did you need to seek medical attention at a hospital or doctor's office? No When did it last happen?      4-5 years ago If all above answers are "NO", may proceed with cephalosporin use.     Antimicrobials this admission: 11/24  vancomycin >>  11/24 cefepime >>   Dose adjustments this admission: None  Microbiology results: 11/24 BCx: ip   Thank you for allowing pharmacy to be a part of this patient's care.  Wynelle Cleveland, PharmD Pharmacy Resident  06/28/2021 3:22 PM

## 2021-06-28 NOTE — ED Notes (Signed)
Pt resting comfortably. Pt informed that for now IP bed is not available. Pt provided ice water to drink.

## 2021-06-28 NOTE — ED Notes (Signed)
Dr Francine Graven at bedside with pt.

## 2021-06-28 NOTE — Consult Note (Signed)
[facility] Nurse wound consult note Consultation was completed by review of records, images and assistance from the bedside nurse/clinical staff.  Reason for Consult: LE wounds Patient with lymphedema and blistering related to this disease process.  Wound type: skin changes related to lymphedema; ruptured blistering and weeping  Pressure Injury POA: NA Measurement:scattered areas of weeping and serous crust  Periwound: skin changes related to lymphedema and brawny edema  Dressing procedure/placement/frequency:  Single layer of xeroform over the open blisters/weeping. Wrap from toes to knees with kerlix and then from toes to knees with Coban (making sure to wrap to the patellar notch).  Change M/W/F beginning 06/29/21. Bedside nurse to change.  Recommend follow up in lymphedema clinic of patient's choice; could be arranged in [facility] outpatient rehab  Lymphedema  Resources (updated 06/2021) Each site requires a referral from your primary care MD [facility] [address], [place]  [phone] (Upper extremities)  [place], [place] [phone] (Lower extremities, PATIENT CAN NOT HAVE A WOUND)  [facility]. [address] [place], [place] [address] [phone] [facility] [address], [place] [address] [phone] [place], [address] Medical Office Building [place], [place] [phone]  [facility] [address], [place] [address] [phone]  [place] [facility]  (only treatment for lymphedema related to cancer diagnosis) [place], [place] [address] [phone]    [facility] [place],  [address] [phone] [facility] [phone] [address] [place],  [address] [phone]    Re consult if needed, will not follow at this time. Thanks  Yaminah Clayborn [employer], RN,CWOCN, CNS, [place] [phone])

## 2021-06-28 NOTE — H&P (Signed)
History and Physical    Earl Ramirez BTD:[id number] DOB: 08-31-1940 DOA: 06/28/2021  PCP: Earl Hire, MD   Patient coming from: Home  I have personally briefly reviewed patient's old medical records in [place]  Chief Complaint: Lower extremity weakness  HPI: Earl Ramirez is a 80 y.o. male with medical history significant for stage IV B prostate cancer on chemotherapy, history of bladder outlet obstruction status post TURP, status post aortic valve replacement, history of A. fib, GERD, neuropathy, thrombocytopenia, chronic lymphedema involving his lower extremities who was recently discharged from the hospital following hospitalization for UTI, lower extremity cellulitis and weakness. Patient was seen by physical therapy during his last hospitalization and rehab was recommended but this was declined by his insurance company and patient was discharged home. Patient states that since his discharge home he has been increasingly weak and for the last 2 days has been unable to get out of his recliner due to weakness and fear of fall.  He has not been able to eat or drink like he should because he has been unable to get around the house.  He has not been able to clean his legs or change his dressings due to weakness and not being able to bend down. He complains of chills but denies having any fever as well as increased pain, redness and swelling in both lower extremities (Lt > Rt). He denies having any abdominal pain, no changes in his bowel habits, no dizziness, no lightheadedness, no palpitations, no diaphoresis, no urinary frequency, no nocturia, no dysuria, no hematuria, no headache, no blurred vision, no focal deficit, no chest pain or shortness of breath. Labs show sodium 136, potassium 4.1, chloride 101, bicarb 27, glucose 127, BUN 16, creatinine 0.8, calcium 8.0, alkaline phosphatase 54, albumin 3.3, AST 38, ALT 30, total protein 5.7, direct bilirubin 0.5, indirect bilirubin 3.9,  total bilirubin 4.4, BNP 800.6, total CK 311, white count 11.1, hemoglobin 13.2, hematocrit 38.4, MCV 104.3, RDW 15.0, platelet count 49 Respiratory viral panel is pending Hepatitis serologies negative Chest x-ray reviewed by me shows cardiomegaly and stable mild central pulmonary vascular prominence. 12 lead EKG reviewed by me shows sinus tachycardia with nonspecific ST and T wave changes.   ED Course: Patient is an 80 year old Caucasian male who presents to the ER via EMS for evaluation of progressively worsening weakness and increased swelling and redness in his lower extremities associated with pain. He was recently hospitalized for bilateral lower extremity cellulitis and weakness and rehab was recommended but this was declined by his insurance company. He will be admitted to the hospital for further evaluation.  Review of Systems: As per HPI otherwise all other systems reviewed and negative.    Past Medical History:  Diagnosis Date   Aortic valve replaced    1998   Arthritis    Atrial fibrillation (HCC)    COPD (chronic obstructive pulmonary disease) (HCC)    Dysrhythmia    atrial fib   Hernia of abdominal wall    Hypertension    Indigestion    Neuropathy    Neuropathy    Prostate cancer ([place])    Sleep apnea    Thrombocytopenia (HCC)    Tremors of nervous system    Urinary problem     Past Surgical History:  Procedure Laterality Date   CALDWELL LUC     tongue      growth removed   TRANSURETHRAL RESECTION OF PROSTATE N/A 01/18/2019   Procedure: CHANNEL TRANSURETHRAL  RESECTION OF THE PROSTATE (TURP);  Surgeon: Earl Espy, MD;  Location: [facility] ORS;  Service: Urology;  Laterality: N/A;   VALVE REPLACEMENT       reports that he quit smoking about 20 years ago. His smoking use included cigarettes. He quit smokeless tobacco use about 20 years ago. He reports current alcohol use of about 10.0 standard drinks per week. He reports that he does not use drugs.  Allergies   Allergen Reactions   Oxycodone-Acetaminophen Nausea Only and Shortness Of Breath   Oxycodone-Acetaminophen     Other reaction(s): Drowsy   Amoxicillin Itching    Hand itching   Penicillins Rash    Did it involve swelling of the face/tongue/throat, SOB, or low BP? No Did it involve sudden or severe rash/hives, skin peeling, or any reaction on the inside of your mouth or nose? No Did you need to seek medical attention at a hospital or doctor's office? No When did it last happen?      4-5 years ago If all above answers are "NO", may proceed with cephalosporin use.     Family History  Problem Relation Age of Onset   Heart attack Father    Alcohol abuse Father    Congestive Heart Failure Mother    Alzheimer's disease Sister       Prior to Admission medications   Medication Sig Start Date End Date Taking? Authorizing Provider  ALPRAZolam Duanne Moron) 0.25 MG tablet Take 0.25 mg by mouth daily as needed for anxiety.     [provider]  aspirin EC 81 MG tablet Take 81 mg by mouth daily.     [provider]  calcium carbonate (TUMS - DOSED IN MG ELEMENTAL CALCIUM) 500 MG chewable tablet Chew 1 tablet by mouth daily.     [provider]  enzalutamide Gillermina Phy) 40 MG tablet Take 4 tablets (160 mg total) by mouth daily. 04/30/21   Lloyd Huger, MD  furosemide (LASIX) 20 MG tablet Take 20 mg by mouth daily. 05/16/20   [provider]  gabapentin (NEURONTIN) 300 MG capsule Take 900 mg by mouth 4 (four) times daily.  09/22/19   [provider]  metoprolol succinate (TOPROL-XL) 25 MG 24 hr tablet Take 25 mg by mouth daily.  05/10/19   [provider]  mirabegron ER (MYRBETRIQ) 50 MG TB24 tablet Take 1 tablet (50 mg total) by mouth daily. 04/26/21   Earl Espy, MD  tamsulosin [facility]) 0.4 MG CAPS capsule Take 1 capsule by mouth twice daily 04/16/21   Earl Espy, MD    Physical Exam: Vitals:   06/28/21 0815 06/28/21 1133 06/28/21  1245 06/28/21 1330  BP: (!) 144/54 124/62 (!) 129/58 (!) 128/48  Pulse: 100 (!) 101 100 (!) 101  Resp: 19 20 (!) 22 20  Temp: 97.9 F (36.6 C) 98 F (36.7 C)    TempSrc: Oral Oral    SpO2: 100% 95% 96% 99%  Weight:      Height:         Vitals:   06/28/21 0815 06/28/21 1133 06/28/21 1245 06/28/21 1330  BP: (!) 144/54 124/62 (!) 129/58 (!) 128/48  Pulse: 100 (!) 101 100 (!) 101  Resp: 19 20 (!) 22 20  Temp: 97.9 F (36.6 C) 98 F (36.7 C)    TempSrc: Oral Oral    SpO2: 100% 95% 96% 99%  Weight:      Height:          Constitutional: Alert and oriented x  3 . Not in any apparent distress. Unkempt HEENT:      Head: Normocephalic and atraumatic.         Eyes: PERLA, EOMI, Conjunctivae are normal. Sclera icterus      Mouth/Throat: Mucous membranes are dry.       Neck: Supple with no signs of meningismus. Cardiovascular: Tachycardic. No murmurs, gallops, or rubs. 2+ symmetrical distal pulses are present . No JVD. 3+ LE edema (Lt > Rt) Respiratory: Respiratory effort normal .Lungs sounds clear bilaterally. No wheezes, crackles, or rhonchi.  Gastrointestinal: Soft, non tender, and non distended with positive bowel sounds.  Genitourinary: No CVA tenderness. Musculoskeletal: Chronic lymphedema involving both lower extremities with venous stasis ulcers.  Increased redness involving the left lower extremity with bullae and some open blisters .intention tremor Neurologic:  Face is symmetric. Moving all extremities.  Generalized weakness. Skin: Skin is warm, dry.  Changes from chronic venous stasis involving both lower extremities Psychiatric: Mood and affect are normal    Labs on Admission: I have personally reviewed following labs and imaging studies  CBC: Recent Labs  Lab 06/28/21 0818  WBC 11.1*  HGB 13.2  HCT 38.4*  MCV 104.3*  PLT 49*   Basic Metabolic Panel: Recent Labs  Lab 06/28/21 0818  NA 136  K 4.1  CL 101  CO2 27  GLUCOSE 127*  BUN 16  CREATININE 0.88   CALCIUM 8.0*   GFR: Estimated Creatinine Clearance: 77.5 mL/min (by C-G formula based on SCr of 0.88 mg/dL). Liver Function Tests: Recent Labs  Lab 06/28/21 0818  AST 38  ALT 30  ALKPHOS 54  BILITOT 4.4*  PROT 5.7*  ALBUMIN 3.3*   No results for input(s): LIPASE, AMYLASE in the last 168 hours. No results for input(s): AMMONIA in the last 168 hours. Coagulation Profile: No results for input(s): INR, PROTIME in the last 168 hours. Cardiac Enzymes: Recent Labs  Lab 06/28/21 0818  CKTOTAL 311   BNP (last 3 results) No results for input(s): PROBNP in the last 8760 hours. HbA1C: No results for input(s): HGBA1C in the last 72 hours. CBG: No results for input(s): GLUCAP in the last 168 hours. Lipid Profile: No results for input(s): CHOL, HDL, LDLCALC, TRIG, CHOLHDL, LDLDIRECT in the last 72 hours. Thyroid Function Tests: No results for input(s): TSH, T4TOTAL, FREET4, T3FREE, THYROIDAB in the last 72 hours. Anemia Panel: No results for input(s): VITAMINB12, FOLATE, FERRITIN, TIBC, IRON, RETICCTPCT in the last 72 hours. Urine analysis:    Component Value Date/Time   COLORURINE AMBER (A) 06/18/2021 0545   APPEARANCEUR HAZY (A) 06/18/2021 0545   APPEARANCEUR Cloudy (A) 05/22/2021 1348   LABSPEC 1.021 06/18/2021 0545   PHURINE 5.0 06/18/2021 0545   GLUCOSEU NEGATIVE 06/18/2021 0545   HGBUR MODERATE (A) 06/18/2021 0545   BILIRUBINUR NEGATIVE 06/18/2021 0545   BILIRUBINUR Negative 05/22/2021 [place] 06/18/2021 0545   PROTEINUR 30 (A) 06/18/2021 0545   NITRITE POSITIVE (A) 06/18/2021 0545   LEUKOCYTESUR SMALL (A) 06/18/2021 0545    Radiological Exams on Admission: DG Chest Portable 1 View  Result Date: 06/28/2021 CLINICAL DATA:  Edema EXAM: PORTABLE CHEST 1 VIEW COMPARISON:  Chest x-ray 06/18/2021 FINDINGS: Heart is enlarged. Mediastinum appears stable. Calcified plaques in the aortic arch. Median sternotomy wires. Mild central pulmonary vascular  prominence similar to previous study. No focal consolidation identified. No significant pleural effusion or pneumothorax. IMPRESSION: Cardiomegaly and stable mild central pulmonary vascular prominence. Electronically Signed   By: Tiburcio Pea.D.  On: 06/28/2021 13:30     Assessment/Plan Principal Problem:   Cellulitis Active Problems:   Thrombocytopenia ([place])   PAD (peripheral artery disease) (HCC)   Lymphedema   A-fib (HCC)   Primary hypertension   Liver cirrhosis (HCC)   Metastatic malignant neoplasm (HCC)   Tremors of nervous system   Physical deconditioning    Patient is an 79 year old admitted to the hospital for cellulitis involving his bilateral lower extremities and physical deconditioning.   Bilateral lower extremity cellulitis Patient has chronic lymphedema as well as venous stasis and presents for evaluation of increased redness, swelling, bullous lesions some of which have deroofed and draining serous fluid (Lt > Rt) Obtain left lower extremity venous Doppler to rule out DVT Keep lower extremities elevated We will treat patient empirically with vancomycin and cefepime Wound care consult      Liver cirrhosis Most likely secondary to alcohol abuse With evidence of portal hypertension which includes splenomegaly and thrombocytopenia. Continue furosemide    Physical deconditioning Following recent hospitalization Patient has become increasingly weak and unable to carry out his activities of daily living We will request PT evaluation    History of stage IV prostate cancer Continue XTANDI Follow-up with urology as an outpatient    History of atrial fibrillation Continue metoprolol for rate control Not on long-term anticoagulation due to thrombocytopenia with increased risk for bleeding    History of bladder outlet obstruction status post TURP Continue Flomax  DVT prophylaxis: None  Code Status: DNR  Family Communication: Greater than 50% of  time was spent discussing patient's condition and plan of care with him at the bedside.  All questions and concerns have been addressed.  He verbalizes understanding and agrees to the plan.  He lists his half brother [name] as his healthcare power of attorney and CODE STATUS was discussed and he requests to be a DO NOT RESUSCITATE Disposition Plan: Back to previous home environment Consults called: Physical therapy/TOC Status:At the time of admission, it appears that the appropriate admission status for this patient is inpatient. This is judged to be reasonable and necessary to provide the required intensity of service to ensure the patient's safety given the presenting symptoms, physical exam findings, and initial radiographic and laboratory data in the context of their comorbid conditions. Patient requires inpatient status due to high intensity of service, high risk for further deterioration and high frequency of surveillance required.     Collier Bullock MD [facility]     06/28/2021, 2:06 PM

## 2021-06-28 NOTE — ED Notes (Signed)
Overdue meds are not verified by pharmacy.

## 2021-06-29 LAB — BASIC METABOLIC PANEL
Anion gap: 6 (ref 5–15)
BUN: 16 mg/dL (ref 8–23)
CO2: 27 mmol/L (ref 22–32)
Calcium: 7.3 mg/dL — ABNORMAL LOW (ref 8.9–10.3)
Chloride: 102 mmol/L (ref 98–111)
Creatinine, Ser: 0.63 mg/dL (ref 0.61–1.24)
GFR, Estimated: >60 mL/min (ref >60)
Glucose, Bld: 86 mg/dL (ref 70–99)
Potassium: 3.4 mmol/L — ABNORMAL LOW (ref 3.5–5.1)
Sodium: 135 mmol/L (ref 135–145)

## 2021-06-29 LAB — CBC
HCT: 33.2 % — ABNORMAL LOW (ref 39.0–52.0)
Hemoglobin: 11.4 g/dL — ABNORMAL LOW (ref 13.0–17.0)
MCH: 36.1 pg — ABNORMAL HIGH (ref 26.0–34.0)
MCHC: 34.3 g/dL (ref 30.0–36.0)
MCV: 105.1 fL — ABNORMAL HIGH (ref 80.0–100.0)
Platelets: 34 10*3/uL — ABNORMAL LOW (ref 150–400)
RBC: 3.16 MIL/uL — ABNORMAL LOW (ref 4.22–5.81)
RDW: 14.7 % (ref 11.5–15.5)
WBC: 4.9 10*3/uL (ref 4.0–10.5)
nRBC: 0 % (ref 0.0–0.2)

## 2021-06-29 MED ORDER — POTASSIUM CHLORIDE CRYS ER 20 MEQ PO TBCR
20.0000 meq | EXTENDED_RELEASE_TABLET | Freq: Once | ORAL | Status: AC
  Administered 2021-06-29: 20 meq via ORAL
  Filled 2021-06-29: qty 1

## 2021-06-29 NOTE — Progress Notes (Signed)
PROGRESS NOTE    Earl Ramirez  HWE:[id number] DOB: July 01, 1941 DOA: 06/28/2021 PCP: Baxter Hire, MD   Chief Complaint  Patient presents with   Weakness  Brief Narrative/Hospital Course: Earl Ramirez, 80 y.o. 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 fib, GERD, neuropathy, thrombocytopenia, chronic lymphedema involving his lower extremities who was recently discharged from the hospital following hospitalization for UTI, lower extremity cellulitis and weakness-very skilled nursing facility was recommended but declined by his insurance company and discharged home, now presents with increasing weakness last 2 days not being able to get out of his recliner due to weakness, fear of fall has not been able to eat or drink as he was not being able to bend down and get around the house and also with redness and swelling in both lower extremities. In the ED found to have generalized weakness along with cellulitis of lower extremities, DVT of the leg was negative on ultrasound. Patient was admitted for further management  Subjective: Seen and examined this morning.  He is alert awake oriented x3.  Reports she was not able to get up at home was having weakness.  Assessment & Plan:  Bilateral lower extremity cellulitis also with blister: Currently afebrile, no DVT on duplex, does have chronic lymphedema, will continue on IV antibiotics vancomycin and cefepime empirically for now, wound care consulted, continue Lasix 20 mg daily, wound care.  Gen weakness/debility/deconditioning, difficult to walk and get up at home: CK is normal, BNP 100.6.  In the setting of his lymphedema cellulitis along with multiple comorbidities recent hospitalization and deconditioning.  Will benefit with PT OT and skilled nursing for replacement this time, was declined by insurance last time.  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  Stage IV prostate cancer history:on Xtandi.  Follow-up with urology as outpatient History of bladder outlet obstruction status post TURP: Continue Flomax  History of A. fib on metoprolol for rate control not on anticoagulation due to thrombocytopenia.  Hold metoprolol due to soft blood pressure today  Class II Obesity:Patient's Body mass index is 38.84 kg/m. : Will benefit with PCP follow-up, weight loss  healthy lifestyle and outpatient sleep evaluation.  DVT prophylaxis:  Code Status:   Code Status: DNR Family Communication: plan of care discussed with patient at bedside. Status is: Inpatient Remains inpatient appropriate because: For ongoing management of cellulitis, deconditioning and debility Disposition: Currently not medically stable for discharge. Anticipated Disposition: SNF likley, PTOT pending. Objective: Vitals last 24 hrs: Vitals:   06/28/21 1700 06/28/21 2118 06/29/21 0531 06/29/21 0858  BP: (!) 116/53 (!) 117/45 (!) 109/45 (!) 108/46  Pulse: (!) 101 99 100 98  Resp: 20 20 20 18   Temp:  98.7 F (37.1 C) 98.9 F (37.2 C) 98.1 F (36.7 C)  TempSrc:      SpO2: 99% 96% 99% 98%  Weight:      Height:       Weight change:   Intake/Output Summary (Last 24 hours) at 06/29/2021 0957 Last data filed at 06/29/2021 8938 Gross per 24 hour  Intake 630.83 ml  Output 100 ml  Net 530.83 ml   Net IO Since Admission: 530.83 mL [06/29/21 0957]   Physical Examination: General exam: AA0x3, weak,older than stated age. HEENT:Oral mucosa moist, Ear/Nose  WNL grossly,dentition normal. Respiratory system: B/l clear BS, no use of accessory muscle, non tender. Cardiovascular system: S1 & S2 +,No JVD. Gastrointestinal system: Abdomen soft, NT,ND, BS+. Nervous System:Alert, awake,  moving extremities. Extremities: Chronic lymphedema, right toe tip blackish area, blister on the right lower extremity hyperkeratotic hyperpigmented erythematous skin changes on lower extremities see picture Skin: No rashes, no icterus. MSK: Normal muscle bulk, tone, power.  Medications reviewed: Scheduled Meds:  aspirin EC  81 mg Oral Daily   calcium carbonate  1 tablet Oral Daily   enzalutamide  160 mg Oral Daily   furosemide  20 mg Oral Daily   gabapentin  900 mg Oral QID   metoprolol succinate  25 mg Oral Daily   mirabegron ER  50 mg Oral Daily   sodium chloride flush  3 mL Intravenous Q12H   tamsulosin  0.4 mg Oral BID   Continuous Infusions:  sodium chloride 10 mL/hr at 06/29/21 0742   ceFEPime (MAXIPIME) IV 2 g (06/29/21 0531)   vancomycin Stopped (06/28/21 2125)   Diet Order             Diet 2 gram sodium Room service appropriate? Yes; Fluid consistency: Thin  Diet effective now                 Weight change:   Wt Readings from Last 3 Encounters:  06/28/21 112.5 kg  06/17/21 108 kg  06/05/21 108.8 kg  Consultants:see note  Procedures:see note Antimicrobials: Anti-infectives (From admission, onward)    Start     Dose/Rate Route Frequency Ordered Stop   06/28/21 2200  ceFEPIme (MAXIPIME) 2 g in sodium chloride 0.9 % 100 mL IVPB        2 g 200 mL/hr over 30 Minutes Intravenous Every 8 hours 06/28/21 1520     06/28/21 1600  vancomycin (VANCOREADY) IVPB 1500 mg/300 mL        1,500 mg 150 mL/hr over 120 Minutes Intravenous Every 24 hours 06/28/21 1520     06/28/21 1415  ceFEPIme (MAXIPIME) 2 g in sodium chloride 0.9 % 100 mL IVPB        2 g 200 mL/hr over 30 Minutes Intravenous  Once 06/28/21 1404 06/28/21 1515   06/28/21 1245  vancomycin (VANCOCIN) IVPB 1000 mg/200 mL premix        1,000 mg 200 mL/hr over 60 Minutes Intravenous  Once 06/28/21 1240 06/28/21 1431      Culture/Microbiology    Component Value Date/Time   SDES BLOOD LEFT ANTECUBITAL  06/28/2021 1249   SDES BLOOD BLOOD RIGHT HAND 06/28/2021 1249   SPECREQUEST  06/28/2021 1249    BOTTLES DRAWN AEROBIC AND ANAEROBIC Blood Culture adequate volume   SPECREQUEST  06/28/2021 1249    BOTTLES DRAWN AEROBIC AND ANAEROBIC Blood Culture adequate volume   CULT  06/28/2021 1249    NO GROWTH < 24 HOURS Performed at [facility], [address] [place], [place] [address]    CULT  06/28/2021 1249    NO GROWTH < 24 HOURS Performed at [facility], [address] [place], [place] [address]    REPTSTATUS PENDING 06/28/2021 1249   REPTSTATUS PENDING 06/28/2021 1249    Other culture-see note  Unresulted Labs (From admission, onward)     Start     Ordered   06/29/21 0500  CBC  Tomorrow morning,   STAT        06/28/21 1404   06/29/21 5188  Basic metabolic panel  Tomorrow morning,  STAT        06/28/21 1404          Data Reviewed: I have personally reviewed following labs and imaging studies CBC: Recent Labs  Lab 06/28/21 0818  WBC 11.1*  HGB 13.2  HCT 38.4*  MCV 104.3*  PLT 49*   Basic Metabolic Panel: Recent Labs  Lab 06/28/21 0818  NA 136  K 4.1  CL 101  CO2 27  GLUCOSE 127*  BUN 16  CREATININE 0.88  CALCIUM 8.0*   GFR: Estimated Creatinine Clearance: 77.5 mL/min (by C-G formula based on SCr of 0.88 mg/dL). Liver Function Tests: Recent Labs  Lab 06/28/21 0818  AST 38  ALT 30  ALKPHOS 54  BILITOT 4.4*  PROT 5.7*  ALBUMIN 3.3*   No results for input(s): LIPASE, AMYLASE in the last 168 hours. No results for input(s): AMMONIA in the last 168 hours. Coagulation Profile: No results for input(s): INR, PROTIME in the last 168 hours. Cardiac Enzymes: Recent Labs  Lab 06/28/21 0818  CKTOTAL 311   BNP (last 3 results) No results for input(s): PROBNP in the last 8760 hours. HbA1C: No results for input(s): HGBA1C in the last 72 hours. CBG: No results for input(s): GLUCAP in the last 168 hours. Lipid Profile: No results for  input(s): CHOL, HDL, LDLCALC, TRIG, CHOLHDL, LDLDIRECT in the last 72 hours. Thyroid Function Tests: No results for input(s): TSH, T4TOTAL, FREET4, T3FREE, THYROIDAB in the last 72 hours. Anemia Panel: No results for input(s): VITAMINB12, FOLATE, FERRITIN, TIBC, IRON, RETICCTPCT in the last 72 hours. Sepsis Labs: No results for input(s): PROCALCITON, LATICACIDVEN in the last 168 hours.  Recent Results (from the past 240 hour(s))  Resp Panel by RT-PCR (Flu A&B, Covid) Nasopharyngeal Swab     Status: None   Collection Time: 06/28/21 12:49 PM   Specimen: Nasopharyngeal Swab; Nasopharyngeal(NP) swabs in vial transport medium  Result Value Ref Range Status   SARS Coronavirus 2 by RT PCR NEGATIVE NEGATIVE Final    Comment: (NOTE) SARS-CoV-2 target nucleic acids are NOT DETECTED.  The SARS-CoV-2 RNA is generally detectable in upper respiratory specimens during the acute phase of infection. The lowest concentration of SARS-CoV-2 viral copies this assay can detect is 138 copies/mL. A negative result does not preclude SARS-Cov-2 infection and should not be used as the sole basis for treatment or other patient management decisions. A negative result may occur with  improper specimen collection/handling, submission of specimen other than nasopharyngeal swab, presence of viral mutation(s) within the areas targeted by this assay, and inadequate number of viral copies(<138 copies/mL). A negative result must be combined with clinical observations, patient history, and epidemiological information. The expected result is Negative.  Fact Sheet for Patients:  EntrepreneurPulse.com.au  Fact Sheet for Healthcare Providers:  IncredibleEmployment.be  This test is no t yet approved or cleared by the [place] FDA and  has been authorized for detection and/or diagnosis of SARS-CoV-2 by FDA under an Emergency Use Authorization (EUA). This EUA will remain  in effect  (meaning this test can be used) for the duration of the COVID-19 declaration under Section 564(b)(1) of the Act, 21 U.S.C.section 360bbb-3(b)(1), unless the authorization is terminated  or revoked sooner.       Influenza A by PCR NEGATIVE NEGATIVE Final   Influenza B by PCR NEGATIVE NEGATIVE Final    Comment: (NOTE) The Xpert Xpress SARS-CoV-2/FLU/RSV plus assay is intended as an aid in the diagnosis of influenza from Nasopharyngeal swab specimens and should not be  used as a sole basis for treatment. Nasal washings and aspirates are unacceptable for Xpert Xpress SARS-CoV-2/FLU/RSV testing.  Fact Sheet for Patients: EntrepreneurPulse.com.au  Fact Sheet for Healthcare Providers: IncredibleEmployment.be  This test is not yet approved or cleared by the [place] FDA and has been authorized for detection and/or diagnosis of SARS-CoV-2 by FDA under an Emergency Use Authorization (EUA). This EUA will remain in effect (meaning this test can be used) for the duration of the COVID-19 declaration under Section 564(b)(1) of the Act, 21 U.S.C. section 360bbb-3(b)(1), unless the authorization is terminated or revoked.  Performed at [facility], [address] [place], [place] [address]   Blood culture (routine x 2)     Status: None (Preliminary result)   Collection Time: 06/28/21 12:49 PM   Specimen: BLOOD  Result Value Ref Range Status   Specimen Description BLOOD LEFT ANTECUBITAL  Final   Special Requests   Final    BOTTLES DRAWN AEROBIC AND ANAEROBIC Blood Culture adequate volume   Culture   Final    NO GROWTH < 24 HOURS Performed at [facility], [address] [place], [place] [address]    Report Status PENDING  Incomplete  Blood culture (routine x 2)     Status: None (Preliminary result)   Collection Time: 06/28/21 12:49 PM   Specimen: BLOOD  Result Value Ref Range Status   Specimen Description BLOOD BLOOD RIGHT  HAND  Final   Special Requests   Final    BOTTLES DRAWN AEROBIC AND ANAEROBIC Blood Culture adequate volume   Culture   Final    NO GROWTH < 24 HOURS Performed at [facility], [address], [place], [place] [address]    Report Status PENDING  Incomplete     Radiology Studies: US Venous Img Lower Unilateral Left (DVT)  Result Date: 06/28/2021 CLINICAL DATA:  80 year old male with LEFT LOWER extremity pain and swelling. EXAM: LEFT LOWER EXTREMITY VENOUS DOPPLER ULTRASOUND TECHNIQUE: Gray-scale sonography with compression, as well as color and duplex ultrasound, were performed to evaluate the deep venous system(s) from the level of the common femoral vein through the popliteal and proximal calf veins. COMPARISON:  None. FINDINGS: VENOUS Normal compressibility of the common femoral, superficial femoral, and popliteal veins, as well as the visualized calf veins. Visualized portions of profunda femoral vein and great saphenous vein unremarkable. No filling defects to suggest DVT on grayscale or color Doppler imaging. Doppler waveforms show normal direction of venous flow, normal respiratory plasticity and response to augmentation. Limited views of the contralateral common femoral vein are unremarkable. OTHER None. Limitations: Calf veins IMPRESSION: Negative. Electronically Signed   By: Margarette Canada M.D.   On: 06/28/2021 15:04   DG Chest Portable 1 View  Result Date: 06/28/2021 CLINICAL DATA:  Edema EXAM: PORTABLE CHEST 1 VIEW COMPARISON:  Chest x-ray 06/18/2021 FINDINGS: Heart is enlarged. Mediastinum appears stable. Calcified plaques in the aortic arch. Median sternotomy wires. Mild central pulmonary vascular prominence similar to previous study. No focal consolidation identified. No significant pleural effusion or pneumothorax. IMPRESSION: Cardiomegaly and stable mild central pulmonary vascular prominence. Electronically Signed   By: Ofilia Neas M.D.   On: 06/28/2021 13:30      LOS: 1 day   Antonieta Pert, MD [facility]  06/29/2021, 9:57 AM

## 2021-06-29 NOTE — Consult Note (Signed)
See consult dated 06/28/21 Orders entered [facility], [place], [place]

## 2021-06-29 NOTE — Evaluation (Signed)
Occupational Therapy Evaluation Patient Details Name: KAIYU MIRABAL MRN: [id number] DOB: 1940/12/27 Today's Date: 06/29/2021   History of Present Illness [name], 80 y.o. 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 fib, GERD, neuropathy, thrombocytopenia, chronic lymphedema involving his lower extremities who was recently discharged from the hospital on 06/22/21. Now admitted on 06/28/21 for generalized weakness along with cellulitis of lower extremities.   Clinical Impression   Mr. Jue was seen for OT evaluation this date. Prior to hospital admission, pt was modified independent. Pt lives alone. Currently pt demonstrates impairments as described below (See OT problem list) which functionally limit his ability to perform ADL/self-care tasks. Pt currently requires MAX A for LBD, donning socks, seated EOB ~ 5 min. SETUP for grooming, face washing seated, EOB. Pt MOD A x 2 for functional mobility, STS at EOB. Pt limited by pain and fatigue. Pt would benefit from skilled OT services to address noted impairments and functional limitations (see below for any additional details) in order to maximize safety and independence while minimizing falls risk and caregiver burden. Upon hospital discharge, recommend STR to maximize pt safety and return to PLOF.       Recommendations for follow up therapy are one component of a multi-disciplinary discharge planning process, led by the attending physician.  Recommendations may be updated based on patient status, additional functional criteria and insurance authorization.   Follow Up Recommendations  Skilled nursing-short term rehab (<3 hours/day)    Assistance Recommended at Discharge Frequent or constant Supervision/Assistance  Functional Status Assessment  Patient has had a recent decline in their functional status and  demonstrates the ability to make significant improvements in function in a reasonable and predictable amount of time.  Equipment Recommendations  Other (comment) (Defer to next venue of care)    Recommendations for Other Services       Precautions / Restrictions Precautions Precautions: Fall Restrictions Weight Bearing Restrictions: No      Mobility Bed Mobility Overal bed mobility: Needs Assistance Bed Mobility: Supine to Sit;Sit to Supine;Rolling Rolling: Mod assist;+2 for physical assistance   Supine to sit: Mod assist;+2 for physical assistance Sit to supine: Mod assist        Transfers Overall transfer level: Needs assistance Equipment used: 1 person hand held assist Transfers: Bed to chair/wheelchair/BSC Sit to Stand: Mod assist;+2 physical assistance          Lateral/Scoot Transfers: Mod assist;+2 physical assistance        Balance Overall balance assessment: Needs assistance Sitting-balance support: Feet supported Sitting balance-Leahy Scale: Good     Standing balance support: Bilateral upper extremity supported;During functional activity Standing balance-Leahy Scale: Fair                             ADL either performed or assessed with clinical judgement   ADL Overall ADL's : Needs assistance/impaired                                       General ADL Comments: Pt MAX A for LBD, donning socks, seated EOB ~ 5 min. SETUP for grooming, face washing seated, EOB. Pt MOD A x 2 for functional mobility, STS at EOB.      Pertinent Vitals/Pain Pain Assessment:  0-10 Pain Score: 6  Pain Location: legs Pain Descriptors / Indicators: Discomfort;Sore Pain Intervention(s): Limited activity within patient's tolerance;Repositioned     Hand Dominance     Extremity/Trunk Assessment Upper Extremity Assessment Upper Extremity Assessment: Overall WFL for tasks assessed   Lower Extremity Assessment Lower Extremity Assessment:  Generalized weakness RLE Deficits / Details: cellulitis/ weeping LLE Deficits / Details: cellulitis/ weeping       Communication Communication Communication: No difficulties   Cognition Arousal/Alertness: Awake/alert Behavior During Therapy: WFL for tasks assessed/performed Overall Cognitive Status: Within Functional Limits for tasks assessed                                       General Comments       Exercises Exercises: Other exercises Other Exercises Other Exercises: Pt educ re: OT role, d/c recs, falls prevention Other Exercises: roll, sit, LBD, grooming, stand   Shoulder Instructions      Home Living Family/patient expects to be discharged to:: Private residence Living Arrangements: Alone Available Help at Discharge: Family;Available PRN/intermittently Type of Home: House Home Access: Stairs to enter [employer] of Steps: 6 Entrance Stairs-Rails: Can reach both Home Layout: One level     Bathroom Shower/Tub: Occupational psychologist: Handicapped height                Prior Functioning/Environment Prior Level of Function : Independent/Modified Independent         Mobility (physical):  (Simultaneous filing. User may not have seen previous data.) ADLs (physical):  (Simultaneous filing. User may not have seen previous data.) Mobility Comments: MOD I for household mobility using 4WW prior to recent 06/18/21 hospital admission and upon recent hospital d/c pt mobile using RW          OT Problem List: Decreased strength;Decreased range of motion;Decreased activity tolerance;Impaired balance (sitting and/or standing);Decreased coordination;Decreased safety awareness      OT Treatment/Interventions: Self-care/ADL training;Therapeutic exercise;Energy conservation;Therapeutic activities;DME and/or AE instruction    OT Goals(Current goals can be found in the care plan section) Acute Rehab OT Goals Patient Stated Goal: to  get better OT Goal Formulation: With patient Time For Goal Achievement: 07/13/21 Potential to Achieve Goals: Good ADL Goals Pt Will Perform Grooming: with min guard assist;standing Pt Will Perform Lower Body Dressing: with min guard assist;sit to/from stand Pt Will Transfer to Toilet: with min guard assist;ambulating;bedside commode  OT Frequency: Min 2X/week   Barriers to D/C:            Co-evaluation              AM-PAC OT "6 Clicks" Daily Activity     Outcome Measure Help from another person eating meals?: A Little Help from another person taking care of personal grooming?: A Little Help from another person toileting, which includes using toliet, bedpan, or urinal?: A Lot Help from another person bathing (including washing, rinsing, drying)?: A Lot Help from another person to put on and taking off regular upper body clothing?: A Little Help from another person to put on and taking off regular lower body clothing?: A Lot 6 Click Score: 15   End of Session    Activity Tolerance: Patient limited by fatigue;Patient limited by pain Patient left: in bed;with call bell/phone within reach;with bed alarm set  OT Visit Diagnosis: Unsteadiness on feet (R26.81);Muscle weakness (generalized) (M62.81)  Time: 9528-4132 OT Time Calculation (min): 24 min Charges:  OT General Charges $OT Visit: 1 Visit OT Treatments $Self Care/Home Management : 8-22 mins  Nino Glow, Markus Daft 06/29/2021, 1:55 PM

## 2021-06-29 NOTE — NC FL2 (Signed)
[place] LEVEL OF CARE SCREENING TOOL     IDENTIFICATION  Patient Name: Earl Ramirez Birthdate: 03/24/1941 Sex: male Admission Date (Current Location): 06/28/2021  [facility] and [place] Number:  Engineering geologist and Address:  [facility], [address] [place], [place] [address]      Provider Number: [id number]  Attending Physician Name and Address:  Antonieta Pert, MD  Relative Name and Phone Number:  Arlana Pouch (brother) [phone]    Current Level of Care: Hospital Recommended Level of Care: [place] Prior Approval Number:    Date Approved/Denied:   PASRR Number: [id number]  Discharge Plan: SNF    Current Diagnoses: Patient Active Problem List   Diagnosis Date Noted   Cellulitis 06/28/2021   Tremors of nervous system    Physical deconditioning    Acute cystitis with hematuria    Osteoarthritis of lumbar spine    Lower extremity weakness 06/18/2021   Thyroid nodule 06/18/2021   A-fib ([place]) 06/18/2021   Primary hypertension 06/18/2021   Liver cirrhosis ([place]) 06/18/2021   Weakness 06/18/2021   Metastatic malignant neoplasm ([place])    Portal hypertension ([place])    Vitamin D deficiency 03/27/2020   Chronic venous insufficiency 03/23/2020   PAD (peripheral artery disease) ([place]) 03/23/2020   Lymphedema 03/23/2020   COPD (chronic obstructive pulmonary disease) ([place]) 12/17/2019   Idiopathic peripheral neuropathy 12/17/2019   Sleep apnea 12/17/2019   Splenomegaly 12/17/2019   Aortic valve disease 06/08/2018   Bilateral leg edema 03/19/2017   Bilateral chronic knee pain 02/12/2016   Bilateral leg pain 02/12/2016   Incomplete emptying of bladder 04/07/2015   Chronic atrial flutter (HCC) 01/30/2015   SOB (shortness of breath) 12/07/2014   Thrombocytopenia ([place]) 12/02/2014   Prostate cancer ([place]) 11/22/2014   Pyuria 10/06/2014   Gross hematuria 10/04/2014   Urinary retention 10/04/2014    Orientation  RESPIRATION BLADDER Height & Weight     Self, Situation, Time, Place  Normal Continent Weight: 112.5 kg Height:  5\' 7"  (170.2 cm)  BEHAVIORAL SYMPTOMS/MOOD NEUROLOGICAL BOWEL NUTRITION STATUS      Continent Diet (see DC summary)  AMBULATORY STATUS COMMUNICATION OF NEEDS Skin   Extensive Assist Verbally Other (Comment) (cellulitis bilateral extremeties, blisters)                       Personal Care Assistance Level of Assistance  Bathing, Feeding, Dressing Bathing Assistance: Maximum assistance Feeding assistance: Maximum assistance Dressing Assistance: Maximum assistance     Functional Limitations Info  Sight, Hearing, Speech Sight Info: Impaired Hearing Info: Adequate Speech Info: Adequate    SPECIAL CARE FACTORS FREQUENCY  PT (By licensed PT), OT (By licensed OT)     PT Frequency: 5 times per week OT Frequency: 5 times per week            Contractures Contractures Info: Not present    Additional Factors Info  Code Status, Allergies Code Status Info: Full code Allergies Info: Oxycodone-acetaminophen, Oxycodone-acetaminophen, Amoxicillin, Penicillins           Current Medications (06/29/2021):  This is the current hospital active medication list Current Facility-Administered Medications  Medication Dose Route Frequency Provider Last Rate Last Admin   0.9 %  sodium chloride infusion  250 mL Intravenous PRN Agbata, Tochukwu, MD 10 mL/hr at 06/29/21 0742 Infusion Verify at 06/29/21 0742   ALPRAZolam (XANAX) tablet 0.25 mg  0.25 mg Oral Daily PRN Agbata, Tochukwu, MD   0.25 mg  at 06/28/21 2126   aspirin EC tablet 81 mg  81 mg Oral Daily Agbata, Tochukwu, MD   81 mg at 06/29/21 0953   calcium carbonate (TUMS - dosed in mg elemental calcium) chewable tablet 200 mg of elemental calcium  1 tablet Oral Daily Agbata, Tochukwu, MD   200 mg of elemental calcium at 06/29/21 0953   ceFEPIme (MAXIPIME) 2 g in sodium chloride 0.9 % 100 mL IVPB  2 g Intravenous Q8H Wynelle Cleveland, RPH 200 mL/hr at 06/29/21 0531 2 g at 06/29/21 0531   enzalutamide (XTANDI) tablet 160 mg  160 mg Oral Daily Agbata, Tochukwu, MD   160 mg at 06/28/21 1829   furosemide (LASIX) tablet 20 mg  20 mg Oral Daily Agbata, Tochukwu, MD   20 mg at 06/29/21 0953   gabapentin (NEURONTIN) capsule 900 mg  900 mg Oral QID Agbata, Tochukwu, MD   900 mg at 06/29/21 0953   metoprolol succinate (TOPROL-XL) 24 hr tablet 25 mg  25 mg Oral Daily Agbata, Tochukwu, MD       mirabegron ER (MYRBETRIQ) tablet 50 mg  50 mg Oral Daily Agbata, Tochukwu, MD   50 mg at 06/29/21 0953   ondansetron (ZOFRAN) tablet 4 mg  4 mg Oral Q6H PRN Agbata, Tochukwu, MD       Or   ondansetron (ZOFRAN) injection 4 mg  4 mg Intravenous Q6H PRN Agbata, Tochukwu, MD       sodium chloride flush (NS) 0.9 % injection 3 mL  3 mL Intravenous Q12H Agbata, Tochukwu, MD   3 mL at 06/29/21 1000   sodium chloride flush (NS) 0.9 % injection 3 mL  3 mL Intravenous PRN Agbata, Tochukwu, MD       tamsulosin (FLOMAX) capsule 0.4 mg  0.4 mg Oral BID Agbata, Tochukwu, MD   0.4 mg at 06/29/21 0953   vancomycin (VANCOREADY) IVPB 1500 mg/300 mL  1,500 mg Intravenous Q24H Wynelle Cleveland, [place] at 06/28/21 2125     Discharge Medications: Please see discharge summary for a list of discharge medications.  Relevant Imaging Results:  Relevant Lab Results:   Additional Information SS# [id number]  Conception Oms, RN

## 2021-06-29 NOTE — Evaluation (Signed)
Physical Therapy Evaluation Patient Details Name: Earl Ramirez MRN: [id number] DOB: September 30, 1940 Today's Date: 06/29/2021  History of Present Illness  Earl Ramirez, 80 y.o. 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 fib, GERD, neuropathy, thrombocytopenia, chronic lymphedema involving his lower extremities who was recently discharged from the hospital on 06/22/21. Now admitted on 06/28/21 for generalized weakness along with cellulitis of lower extremities.  Clinical Impression  Patient resting in bed upon arrival to room; awaiting wound care to bilat LEs for edema control.  Patient alert and oriented; follows commands and agreeable to some participation with session. Mildly depressed affect; discouraged by re-admission to hospital (after failed trial of return to home; ins denied rehab).  Generally weak and deconditioned throughout extremities, LEs > UEs, but no focal weakness appreciated.  Significant edema, erythema and scaling (with fluid-filled pockets) to LEs.  Currently requiring min/mod assist for bed mobility; requiring act assist for movement of LEs.  Limited flexibility and dissociation of LEs from trunk with movement of extremities. Standing/OOB activities deferred, as patient awaiting application of LE dressings. Will continue assessment/progession of OOB activities next session. Would benefit from skilled PT to address above deficits and promote optimal return to PLOF.; recommend transition to STR upon discharge from acute hospitalization, as patient unable to safely and effectively manage care in home environment given recent functional decline (that did respond well to rehab intervention on previous hospitalization).        Recommendations for follow up therapy are one component of a multi-disciplinary discharge planning process, led by the attending physician.   Recommendations may be updated based on patient status, additional functional criteria and insurance authorization.  Follow Up Recommendations Skilled nursing-short term rehab (<3 hours/day)    Assistance Recommended at Discharge Frequent or constant Supervision/Assistance  Functional Status Assessment Patient has had a recent decline in their functional status and demonstrates the ability to make significant improvements in function in a reasonable and predictable amount of time.  Equipment Recommendations       Recommendations for Other Services       Precautions / Restrictions Precautions Precautions: Fall Restrictions Weight Bearing Restrictions: No      Mobility  Bed Mobility Overal bed mobility: Needs Assistance Bed Mobility: Rolling Rolling: Min assist;Mod assist   Supine to sit: Mod assist;+2 for physical assistance Sit to supine: Mod assist   General bed mobility comments: assist for UE/LE placement and initiation of rolling; limited LE flexibility and dissociation of LEs from trunk    Transfers Overall transfer level: Needs assistance Equipment used: 1 person hand held assist Transfers: Bed to chair/wheelchair/BSC Sit to Stand: Mod assist;+2 physical assistance          Lateral/Scoot Transfers: Mod assist;+2 physical assistance General transfer comment: declined at this time    Ambulation/Gait               General Gait Details: declined at this time  Stairs            Wheelchair Mobility    Modified Rankin (Stroke Patients Only)       Balance Overall balance assessment: Needs assistance Sitting-balance support: Feet supported Sitting balance-Leahy Scale: Good     Standing balance support: Bilateral upper extremity supported;During functional activity Standing balance-Leahy Scale: Fair  Pertinent Vitals/Pain Pain Assessment: Faces Pain Score: 6  Faces Pain Scale: Hurts little  more Pain Location: legs Pain Descriptors / Indicators: Discomfort;Sore;Grimacing Pain Intervention(s): Limited activity within patient's tolerance;Monitored during session;Repositioned    Home Living Family/patient expects to be discharged to:: Private residence Living Arrangements: Alone Available Help at Discharge: Family;Available PRN/intermittently Type of Home: House Home Access: Stairs to enter Entrance Stairs-Rails: Can reach both Entrance Stairs-Number of Steps: 6   Home Layout: One level Home Equipment: Shower seat;Grab bars - tub/shower;Cane - single point;Rollator (4 wheels);Rolling Walker (2 wheels)      Prior Function Prior Level of Function : Independent/Modified Independent         Mobility (physical):  (Simultaneous filing. User may not have seen previous data.) ADLs (physical):  (Simultaneous filing. User may not have seen previous data.) Mobility Comments: MOD I for household mobility using 4WW prior to recent 06/18/21 hospital admission and upon recent hospital d/c pt mobile using RW ADLs Comments: Pt uses AE for LB dressing; occassionally takes showers but frequent relies on sponge baths; has groceries delivered; stopped driving ~ 2 months ago     Hand Dominance        Extremity/Trunk Assessment   Upper Extremity Assessment Upper Extremity Assessment: Overall WFL for tasks assessed    Lower Extremity Assessment Lower Extremity Assessment: Generalized weakness (grossly 3-/5 throughout; significant edema, scaling to LEs with multiple areas of pocketed fluid) RLE Deficits / Details: cellulitis/ weeping LLE Deficits / Details: cellulitis/ weeping       Communication   Communication: No difficulties  Cognition Arousal/Alertness: Awake/alert Behavior During Therapy: WFL for tasks assessed/performed Overall Cognitive Status: Within Functional Limits for tasks assessed                                          General Comments       Exercises Other Exercises Other Exercises: Supine LE therex, 1x10, act assist ROM: ankle pumps, quad sets, SAQs, heel slides, hip abduct/adduct.  End-ranges limited by generalized LE edema; LEs supported in NWB position to avoid shearing/friction on bed surface.  Notable decrease in strength/range compared to previous admission Other Exercises: Rolling, min/mod assist for UE/LE placement and rotation of body   Assessment/Plan    PT Assessment Patient needs continued PT services  PT Problem List Decreased strength;Decreased mobility;Decreased safety awareness;Decreased range of motion;Decreased activity tolerance;Decreased balance       PT Treatment Interventions DME instruction;Therapeutic activities;Gait training;Therapeutic exercise;Patient/family education;Stair training;Balance training;Functional mobility training;Neuromuscular re-education    PT Goals (Current goals can be found in the Care Plan section)  Acute Rehab PT Goals Patient Stated Goal: to go to rehab and get strength back PT Goal Formulation: With patient Time For Goal Achievement: 07/13/21 Potential to Achieve Goals: Fair    Frequency Min 2X/week   Barriers to discharge Inaccessible home environment;Decreased caregiver support      Co-evaluation               AM-PAC PT "6 Clicks" Mobility  Outcome Measure Help needed turning from your back to your side while in a flat bed without using bedrails?: A Lot Help needed moving from lying on your back to sitting on the side of a flat bed without using bedrails?: A Lot Help needed moving to and from a bed to a chair (including a wheelchair)?: Total Help needed standing up from a chair using your  arms (e.g., wheelchair or bedside chair)?: Total Help needed to walk in hospital room?: Total Help needed climbing 3-5 steps with a railing? : Total 6 Click Score: 8    End of Session   Activity Tolerance: Patient limited by fatigue Patient left: in bed;with call  bell/phone within reach;with bed alarm set Nurse Communication: Mobility status PT Visit Diagnosis: Unsteadiness on feet (R26.81);Muscle weakness (generalized) (M62.81);History of falling (Z91.81);Difficulty in walking, not elsewhere classified (R26.2)    Time: 3335-4562 PT Time Calculation (min) (ACUTE ONLY): 17 min   Charges:   PT Evaluation $PT Eval Moderate Complexity: 1 Mod PT Treatments $Therapeutic Exercise: 8-22 mins      Dacari Beckstrand H. Owens Shark, PT, DPT, NCS 06/29/21, 3:55 PM [phone]

## 2021-06-29 NOTE — TOC Initial Note (Addendum)
Transition of Care [facility]) - Progression Note    Patient Details  Name: Earl Ramirez MRN: [id number] Date of Birth: [birth year]  Transition [facility] CM/SW [place], RN Phone Number: 06/29/2021, 10:47 AM  Clinical Narrative:    The patient Discharged Home with his brother last week.  He was unable to get out of bed and had to use his medical alert to call for help, He has an extensive Medical History and commodities, Last admission he was denied to go to [facility] SN[facility], he stated that he is definitely unable to return home without going to [facility] SNF first due to being unable to get up and walk at all.  He stated that he wants to go to STR SNF then plans to return home with his Brother afterwards, He was independent at home prior to his last admission, Anticipated that he will be able to gain mobility again with PT. He stated if he is denied this time he will appeal and then appeal to Medicare as a second level if needed, He will be seen by PT for recommendations, FL2 completed with Anticipated STR SNF needs, Bedsearch sent, PASSR obtained        Expected Discharge Plan and Services                                                 Social Determinants of Health (SDOH) Interventions    Readmission Risk Interventions No flowsheet data found.

## 2021-06-30 LAB — CBC
HCT: 33 % — ABNORMAL LOW (ref 39.0–52.0)
Hemoglobin: 11.5 g/dL — ABNORMAL LOW (ref 13.0–17.0)
MCH: 36.1 pg — ABNORMAL HIGH (ref 26.0–34.0)
MCHC: 34.8 g/dL (ref 30.0–36.0)
MCV: 103.4 fL — ABNORMAL HIGH (ref 80.0–100.0)
Platelets: 31 10*3/uL — ABNORMAL LOW (ref 150–400)
RBC: 3.19 MIL/uL — ABNORMAL LOW (ref 4.22–5.81)
RDW: 14.6 % (ref 11.5–15.5)
WBC: 3.5 10*3/uL — ABNORMAL LOW (ref 4.0–10.5)
nRBC: 0 % (ref 0.0–0.2)

## 2021-06-30 MED ORDER — IBUPROFEN 400 MG PO TABS
200.0000 mg | ORAL_TABLET | Freq: Four times a day (QID) | ORAL | Status: DC | PRN
  Administered 2021-06-30 – 2021-07-01 (×2): 200 mg via ORAL
  Filled 2021-06-30 (×2): qty 1

## 2021-06-30 NOTE — Progress Notes (Signed)
PROGRESS NOTE    Earl Ramirez  QMV:[id number] DOB: [birth year] DOA: 06/28/2021 PCP: Baxter Hire, MD   Chief Complaint  Patient presents with   Weakness  Brief Narrative/Hospital Course: Earl Ramirez, 80 y.o. 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 fib, GERD, neuropathy, thrombocytopenia, chronic lymphedema involving his lower extremities who was recently discharged from the hospital following hospitalization for UTI, lower extremity cellulitis and weakness-very skilled nursing facility was recommended but declined by his insurance company and discharged home, now presents with increasing weakness last 2 days not being able to get out of his recliner due to weakness, fear of fall has not been able to eat or drink as he was not being able to bend down and get around the house and also with redness and swelling in both lower extremities. In the ED found to have generalized weakness along with cellulitis of lower extremities, DVT of the leg was negative on ultrasound. Patient was admitted for further management Placed on empiric antibiotics seen by wound care, also seen by PT OT, SNF has been recommended.  Subjective:  Resting comfortably.  No fever overnight. Legs are wrapped and he feels better with it.  Assessment & Plan:  Bilateral lower extremity cellulitis also with blister: Currently afebrile, no DVT on duplex, does have chronic lymphedema, will switch to oral Keflex from tomorrow, continue IV antibiotics vancomycin and cefepime today. continue Lasix 20 mg daily, wound care-with leg wrapping bilateral lower extremities.  Gen weakness/debility/deconditioning, difficult to walk and get up at home: CK is normal, BNP 100.6.  In the setting of his lymphedema cellulitis along with multiple comorbidities recent hospitalization and deconditioning.  Continue PT  OT, planning for skilled nursing facility placement.  SNF was declined by insurance last time.  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 Cont home Lasix.  Stage IV prostate cancer history:on Xtandi.  Follow-up with urology as outpatient History of bladder outlet obstruction status post TURP: Continue Flomax  History of A. fib on metoprolol for rate control not on anticoagulation due to thrombocytopenia.  Continue metoprolol with holding parameters  Class II Obesity:Patient's Body mass index is 38.84 kg/m. : Will benefit with PCP follow-up, weight loss  healthy lifestyle and outpatient sleep evaluation.  DVT prophylaxis: Place and maintain sequential compression device Start: 06/29/21 1352 Code Status:   Code Status: DNR Family Communication: plan of care discussed with patient at bedside. Status is: Inpatient Remains inpatient appropriate because: For ongoing management of cellulitis, deconditioning and debility Disposition: Currently not medically stable for discharge. Anticipated Disposition: SNF. Objective: Vitals last 24 hrs: Vitals:   06/29/21 1619 06/29/21 2029 06/30/21 0414 06/30/21 0755  BP: (!) 141/77 (!) 112/53 127/60 (!) 115/52  Pulse: (!) 105 (!) 102 100 (!) 102  Resp: 18 18 16 18   Temp: 97.8 F (36.6 C) 97.7 F (36.5 C) 98 F (36.7 C) 98.4 F (36.9 C)  TempSrc:      SpO2: 100% 97% 98% 98%  Weight:      Height:       Weight change:   Intake/Output Summary (Last 24 hours) at 06/30/2021 0825 Last data filed at 06/29/2021 2020 Gross per 24 hour  Intake 1230.21 ml  Output 400 ml  Net 830.21 ml    Net IO Since Admission: 1,361.04  mL [06/30/21 0825]   Physical Examination: General exam: AAOx 3, older than stated age, weak appearing. HEENT:Oral mucosa moist, Ear/Nose WNL grossly, dentition normal. Respiratory  system: bilaterally diminished, no use of accessory muscle Cardiovascular system: S1 & S2 +, No JVD,. Gastrointestinal system: Abdomen soft, NT,ND, BS+ Nervous System:Alert, awake, moving extremities and grossly nonfocal Extremities: Chronic lymphedema lower extremities with bilateral lower extremity and wrapping today Skin: No rashes,no icterus. MSK: Normal muscle bulk,tone, power    Medications reviewed: Scheduled Meds:  aspirin EC  81 mg Oral Daily   calcium carbonate  1 tablet Oral Daily   enzalutamide  160 mg Oral Daily   furosemide  20 mg Oral Daily   gabapentin  900 mg Oral QID   metoprolol succinate  25 mg Oral Daily   mirabegron ER  50 mg Oral Daily   sodium chloride flush  3 mL Intravenous Q12H   tamsulosin  0.4 mg Oral BID   Continuous Infusions:  sodium chloride Stopped (06/29/21 1521)   ceFEPime (MAXIPIME) IV 2 g (06/30/21 0536)   vancomycin 1,500 mg (06/29/21 1521)   Diet Order             Diet 2 gram sodium Room service appropriate? Yes; Fluid consistency: Thin  Diet effective now                 Weight change:   Wt Readings from Last 3 Encounters:  06/28/21 112.5 kg  06/17/21 108 kg  06/05/21 108.8 kg  Consultants:see note  Procedures:see note Antimicrobials: Anti-infectives (From admission, onward)    Start     Dose/Rate Route Frequency Ordered Stop   06/28/21 2200  ceFEPIme (MAXIPIME) 2 g in sodium chloride 0.9 % 100 mL IVPB        2 g 200 mL/hr over 30 Minutes Intravenous Every 8 hours 06/28/21 1520     06/28/21 1600  vancomycin (VANCOREADY) IVPB 1500 mg/300 mL        1,500 mg 150 mL/hr over 120 Minutes Intravenous Every 24 hours 06/28/21 1520     06/28/21 1415  ceFEPIme (MAXIPIME) 2 g in sodium chloride 0.9 % 100 mL IVPB        2 g 200 mL/hr over 30 Minutes Intravenous  Once 06/28/21 1404 06/28/21 1515   06/28/21 1245  vancomycin (VANCOCIN) IVPB 1000 mg/200 mL premix        1,000 mg 200 mL/hr over 60 Minutes Intravenous  Once 06/28/21 1240  06/28/21 1431      Culture/Microbiology    Component Value Date/Time   SDES BLOOD LEFT ANTECUBITAL 06/28/2021 1249   SDES BLOOD BLOOD RIGHT HAND 06/28/2021 1249   SPECREQUEST  06/28/2021 1249    BOTTLES DRAWN AEROBIC AND ANAEROBIC Blood Culture adequate volume   SPECREQUEST  06/28/2021 1249    BOTTLES DRAWN AEROBIC AND ANAEROBIC Blood Culture adequate volume   CULT  06/28/2021 1249    NO GROWTH 2 DAYS Performed at [facility], [address] [place], [place] [address]    CULT  06/28/2021 1249    NO GROWTH 2 DAYS Performed at [facility], [address] [place], [place] [address]    REPTSTATUS PENDING 06/28/2021 1249   REPTSTATUS PENDING 06/28/2021 1249    Other culture-see note  Unresulted Labs (From admission, onward)    None     Data Reviewed: I have personally reviewed following labs and imaging studies CBC: Recent Labs  Lab 06/28/21 0818 06/29/21 0816 06/30/21 0628  WBC 11.1* 4.9  3.5*  HGB 13.2 11.4* 11.5*  HCT 38.4* 33.2* 33.0*  MCV 104.3* 105.1* 103.4*  PLT 49* 34* 31*    Basic Metabolic Panel: Recent Labs  Lab 06/28/21 0818 06/29/21 0816  NA 136 135  K 4.1 3.4*  CL 101 102  CO2 27 27  GLUCOSE 127* 86  BUN 16 16  CREATININE 0.88 0.63  CALCIUM 8.0* 7.3*    GFR: Estimated Creatinine Clearance: 85.3 mL/min (by C-G formula based on SCr of 0.63 mg/dL). Liver Function Tests: Recent Labs  Lab 06/28/21 0818  AST 38  ALT 30  ALKPHOS 54  BILITOT 4.4*  PROT 5.7*  ALBUMIN 3.3*    No results for input(s): LIPASE, AMYLASE in the last 168 hours. No results for input(s): AMMONIA in the last 168 hours. Coagulation Profile: No results for input(s): INR, PROTIME in the last 168 hours. Cardiac Enzymes: Recent Labs  Lab 06/28/21 0818  CKTOTAL 311    BNP (last 3 results) No results for input(s): PROBNP in the last 8760 hours. HbA1C: No results for input(s): HGBA1C in the last 72 hours. CBG: No results for input(s):  GLUCAP in the last 168 hours. Lipid Profile: No results for input(s): CHOL, HDL, LDLCALC, TRIG, CHOLHDL, LDLDIRECT in the last 72 hours. Thyroid Function Tests: No results for input(s): TSH, T4TOTAL, FREET4, T3FREE, THYROIDAB in the last 72 hours. Anemia Panel: No results for input(s): VITAMINB12, FOLATE, FERRITIN, TIBC, IRON, RETICCTPCT in the last 72 hours. Sepsis Labs: No results for input(s): PROCALCITON, LATICACIDVEN in the last 168 hours.  Recent Results (from the past 240 hour(s))  Resp Panel by RT-PCR (Flu A&B, Covid) Nasopharyngeal Swab     Status: None   Collection Time: 06/28/21 12:49 PM   Specimen: Nasopharyngeal Swab; Nasopharyngeal(NP) swabs in vial transport medium  Result Value Ref Range Status   SARS Coronavirus 2 by RT PCR NEGATIVE NEGATIVE Final    Comment: (NOTE) SARS-CoV-2 target nucleic acids are NOT DETECTED.  The SARS-CoV-2 RNA is generally detectable in upper respiratory specimens during the acute phase of infection. The lowest concentration of SARS-CoV-2 viral copies this assay can detect is 138 copies/mL. A negative result does not preclude SARS-Cov-2 infection and should not be used as the sole basis for treatment or other patient management decisions. A negative result may occur with  improper specimen collection/handling, submission of specimen other than nasopharyngeal swab, presence of viral mutation(s) within the areas targeted by this assay, and inadequate number of viral copies(<138 copies/mL). A negative result must be combined with clinical observations, patient history, and epidemiological information. The expected result is Negative.  Fact Sheet for Patients:  EntrepreneurPulse.com.au  Fact Sheet for Healthcare Providers:  IncredibleEmployment.be  This test is no t yet approved or cleared by the [place] FDA and  has been authorized for detection and/or diagnosis of SARS-CoV-2 by FDA under an  Emergency Use Authorization (EUA). This EUA will remain  in effect (meaning this test can be used) for the duration of the COVID-19 declaration under Section 564(b)(1) of the Act, 21 U.S.C.section 360bbb-3(b)(1), unless the authorization is terminated  or revoked sooner.       Influenza A by PCR NEGATIVE NEGATIVE Final   Influenza B by PCR NEGATIVE NEGATIVE Final    Comment: (NOTE) The Xpert Xpress SARS-CoV-2/FLU/RSV plus assay is intended as an aid in the diagnosis of influenza from Nasopharyngeal swab specimens and should not be used as a sole basis for treatment. Nasal washings and aspirates are unacceptable for Xpert Xpress SARS-CoV-2/FLU/RSV  testing.  Fact Sheet for Patients: EntrepreneurPulse.com.au  Fact Sheet for Healthcare Providers: IncredibleEmployment.be  This test is not yet approved or cleared by the [place] FDA and has been authorized for detection and/or diagnosis of SARS-CoV-2 by FDA under an Emergency Use Authorization (EUA). This EUA will remain in effect (meaning this test can be used) for the duration of the COVID-19 declaration under Section 564(b)(1) of the Act, 21 U.S.C. section 360bbb-3(b)(1), unless the authorization is terminated or revoked.  Performed at [facility], [address] [place], [place] [address]   Blood culture (routine x 2)     Status: None (Preliminary result)   Collection Time: 06/28/21 12:49 PM   Specimen: BLOOD  Result Value Ref Range Status   Specimen Description BLOOD LEFT ANTECUBITAL  Final   Special Requests   Final    BOTTLES DRAWN AEROBIC AND ANAEROBIC Blood Culture adequate volume   Culture   Final    NO GROWTH 2 DAYS Performed at [facility], [address] [place], [place] [address]    Report Status PENDING  Incomplete  Blood culture (routine x 2)     Status: None (Preliminary result)   Collection Time: 06/28/21 12:49 PM   Specimen: BLOOD  Result  Value Ref Range Status   Specimen Description BLOOD BLOOD RIGHT HAND  Final   Special Requests   Final    BOTTLES DRAWN AEROBIC AND ANAEROBIC Blood Culture adequate volume   Culture   Final    NO GROWTH 2 DAYS Performed at [facility], [address] [place], [place] [address]    Report Status PENDING  Incomplete      Radiology Studies: US Venous Img Lower Unilateral Left (DVT)  Result Date: 06/28/2021 CLINICAL DATA:  80 year old male with LEFT LOWER extremity pain and swelling. EXAM: LEFT LOWER EXTREMITY VENOUS DOPPLER ULTRASOUND TECHNIQUE: Gray-scale sonography with compression, as well as color and duplex ultrasound, were performed to evaluate the deep venous system(s) from the level of the common femoral vein through the popliteal and proximal calf veins. COMPARISON:  None. FINDINGS: VENOUS Normal compressibility of the common femoral, superficial femoral, and popliteal veins, as well as the visualized calf veins. Visualized portions of profunda femoral vein and great saphenous vein unremarkable. No filling defects to suggest DVT on grayscale or color Doppler imaging. Doppler waveforms show normal direction of venous flow, normal respiratory plasticity and response to augmentation. Limited views of the contralateral common femoral vein are unremarkable. OTHER None. Limitations: Calf veins IMPRESSION: Negative. Electronically Signed   By: Margarette Canada M.D.   On: 06/28/2021 15:04   DG Chest Portable 1 View  Result Date: 06/28/2021 CLINICAL DATA:  Edema EXAM: PORTABLE CHEST 1 VIEW COMPARISON:  Chest x-ray 06/18/2021 FINDINGS: Heart is enlarged. Mediastinum appears stable. Calcified plaques in the aortic arch. Median sternotomy wires. Mild central pulmonary vascular prominence similar to previous study. No focal consolidation identified. No significant pleural effusion or pneumothorax. IMPRESSION: Cardiomegaly and stable mild central pulmonary vascular prominence. Electronically  Signed   By: Ofilia Neas M.D.   On: 06/28/2021 13:30     LOS: 2 days   Antonieta Pert, MD [facility]  06/30/2021, 8:25 AM

## 2021-07-01 MED ORDER — CEPHALEXIN 500 MG PO CAPS
500.0000 mg | ORAL_CAPSULE | Freq: Four times a day (QID) | ORAL | Status: DC
  Administered 2021-07-01 – 2021-07-02 (×5): 500 mg via ORAL
  Filled 2021-07-01 (×5): qty 1

## 2021-07-01 NOTE — TOC Progression Note (Signed)
Transition of Care [facility]) - Progression Note    Patient Details  Name: ZAKARIYYA HELFMAN MRN: [id number] Date of Birth: Jan 29, 1941  Transition of Care [facility] CM/SW Bryant, RN Phone Number: 07/01/2021, 11:21 AM  Clinical Narrative:  Reviewed the bed options with the patient, he chose [facility] in [place], I notified Butch Penny At [facility], I spoke with Jackelyn Poling at [facility] she will send the information to Amy to review I requested EMS auth for [facility] as well. Awaiting approval         Expected Discharge Plan and Services                                                 Social Determinants of Health (SDOH) Interventions    Readmission Risk Interventions No flowsheet data found.

## 2021-07-01 NOTE — Progress Notes (Signed)
PROGRESS NOTE    TREVIN GARTRELL  OXB:[id number] DOB: 09/06/1940 DOA: 06/28/2021 PCP: Baxter Hire, MD   Chief Complaint  Patient presents with   Weakness  Brief Narrative/Hospital Course: [name], 80 y.o. 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 fib, GERD, neuropathy, thrombocytopenia, chronic lymphedema involving his lower extremities who was recently discharged from the hospital following hospitalization for UTI, lower extremity cellulitis and weakness-very skilled nursing facility was recommended but declined by his insurance company and discharged home, now presents with increasing weakness last 2 days not being able to get out of his recliner due to weakness, fear of fall has not been able to eat or drink as he was not being able to bend down and get around the house and also with redness and swelling in both lower extremities. In the ED found to have generalized weakness along with cellulitis of lower extremities, DVT of the leg was negative on ultrasound. Patient was admitted for further management Placed on empiric antibiotics seen by wound care, also seen by PT OT, SNF has been recommended.  Subjective: Seen examined this morning.  Resting comfortably denies any new complaints.  Lower extremities are wrapped Overnight afebrile, blood pressure somewhat soft in high 90s  Assessment & Plan:  Bilateral lower extremity cellulitis with blister: no DVT on duplex, does have chronic lymphedema, cont with wound care, wrapping.  On IV antibiotics vancomycin/cefepime,  and plan to switch to oral Keflex starting today. Cont Lasix - increase 20 > 40 mg daily.  Gen weakness/debility/deconditioning, difficult to walk and get up at home: CK is normal, BNP 100.6.  In the setting of his lymphedema cellulitis along with multiple comorbidities recent hospitalization  and deconditioning having weakness debility.  In significantly benefit with short-term skilled nursing facility placement. SNF was declined by insurance last time.  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 Cont home Lasix.  Currently compensated  Stage IV prostate cancer history:on Xtandi.  Follow-up with urology as outpatient History of bladder outlet obstruction status post TURP: Continue Flomax.  Voiding.  History of A. fib on metoprolol for rate control not on anticoagulation due to thrombocytopenia.  Continue metoprolol with holding parameters  Class II Obesity:Patient's Body mass index is 38.84 kg/m. : Will benefit with PCP follow-up, weight loss  healthy lifestyle and outpatient sleep evaluation.  DVT prophylaxis: Place and maintain sequential compression device Start: 06/29/21 1352 Code Status:   Code Status: DNR Family Communication: plan of care discussed with patient at bedside. Status is: Inpatient Remains inpatient appropriate because: For ongoing management of cellulitis, deconditioning and debility Disposition: Currently medically stable for discharge. Anticipated Disposition: SNF. Objective: Vitals last 24 hrs: Vitals:   06/30/21 1616 06/30/21 2207 07/01/21 0238 07/01/21 0735  BP: (!) 119/42 (!) 101/44 (!) 98/46 (!) 101/47  Pulse: 69 67 72 71  Resp: 18 16 15 18   Temp: 98.2 F (36.8 C) 97.8 F (36.6 C) 98.2 F (36.8 C) 98 F (36.7 C)  TempSrc:  Oral    SpO2: 98% 97% 98% 100%  Weight:      Height:       Weight change:  No intake or output data in the 24 hours ending 07/01/21 1042 Net IO Since Admission: 861.04 mL [07/01/21 1042]   Physical Examination: General  exam: AAOx 3, pleasant, obese older than stated age, weak appearing. HEENT:Oral mucosa moist, Ear/Nose WNL grossly, dentition normal. Respiratory  system: bilaterally diminished,  no use of accessory muscle Cardiovascular system: S1 & S2 +, No JVD,. Gastrointestinal system: Abdomen soft, NT,ND, BS+ Nervous System:Alert, awake, moving extremities and grossly nonfocal Extremities: Chronic lymphedema with big blister on right lower extremity, leg left chronic skin changes noted Skin: No rashes,no icterus. MSK: Normal muscle bulk,tone, power    Medications reviewed: Scheduled Meds:  aspirin EC  81 mg Oral Daily   calcium carbonate  1 tablet Oral Daily   cephALEXin  500 mg Oral Q6H   enzalutamide  160 mg Oral Daily   furosemide  20 mg Oral Daily   gabapentin  900 mg Oral QID   metoprolol succinate  25 mg Oral Daily   mirabegron ER  50 mg Oral Daily   sodium chloride flush  3 mL Intravenous Q12H   tamsulosin  0.4 mg Oral BID   Continuous Infusions:  sodium chloride Stopped (06/29/21 1521)   Diet Order             Diet 2 gram sodium Room service appropriate? Yes; Fluid consistency: Thin  Diet effective now                 Weight change:   Wt Readings from Last 3 Encounters:  06/28/21 112.5 kg  06/17/21 108 kg  06/05/21 108.8 kg  Consultants:see note  Procedures:see note Antimicrobials: Anti-infectives (From admission, onward)    Start     Dose/Rate Route Frequency Ordered Stop   07/01/21 1200  cephALEXin (KEFLEX) capsule 500 mg        500 mg Oral Every 6 hours 07/01/21 1036     06/28/21 2200  ceFEPIme (MAXIPIME) 2 g in sodium chloride 0.9 % 100 mL IVPB  Status:  Discontinued        2 g 200 mL/hr over 30 Minutes Intravenous Every 8 hours 06/28/21 1520 07/01/21 1036   06/28/21 1600  vancomycin (VANCOREADY) IVPB 1500 mg/300 mL  Status:  Discontinued        1,500 mg 150 mL/hr over 120 Minutes Intravenous Every 24 hours 06/28/21 1520 07/01/21 1036   06/28/21 1415  ceFEPIme (MAXIPIME) 2 g in sodium chloride 0.9 % 100 mL IVPB        2 g 200 mL/hr over 30 Minutes Intravenous  Once 06/28/21 1404 06/28/21 1515   06/28/21  1245  vancomycin (VANCOCIN) IVPB 1000 mg/200 mL premix        1,000 mg 200 mL/hr over 60 Minutes Intravenous  Once 06/28/21 1240 06/28/21 1431      Culture/Microbiology    Component Value Date/Time   SDES BLOOD LEFT ANTECUBITAL 06/28/2021 1249   SDES BLOOD BLOOD RIGHT HAND 06/28/2021 1249   SPECREQUEST  06/28/2021 1249    BOTTLES DRAWN AEROBIC AND ANAEROBIC Blood Culture adequate volume   SPECREQUEST  06/28/2021 1249    BOTTLES DRAWN AEROBIC AND ANAEROBIC Blood Culture adequate volume   CULT  06/28/2021 1249    NO GROWTH 3 DAYS Performed at [facility], [address] [place], [place] [address]    CULT  06/28/2021 1249    NO GROWTH 3 DAYS Performed at [facility], [address] [place], [place] [address]    REPTSTATUS PENDING 06/28/2021 1249   REPTSTATUS PENDING 06/28/2021 1249    Other culture-see note  Unresulted Labs (From admission, onward)    None  Data Reviewed: I have personally reviewed following labs and imaging studies CBC: Recent Labs  Lab 06/28/21 0818 06/29/21 0816 06/30/21 0628  WBC 11.1* 4.9 3.5*  HGB 13.2 11.4* 11.5*  HCT 38.4* 33.2* 33.0*  MCV 104.3* 105.1* 103.4*  PLT 49* 34* 31*   Basic Metabolic Panel: Recent Labs  Lab 06/28/21 0818 06/29/21 0816  NA 136 135  K 4.1 3.4*  CL 101 102  CO2 27 27  GLUCOSE 127* 86  BUN 16 16  CREATININE 0.88 0.63  CALCIUM 8.0* 7.3*   GFR: Estimated Creatinine Clearance: 85.3 mL/min (by C-G formula based on SCr of 0.63 mg/dL). Liver Function Tests: Recent Labs  Lab 06/28/21 0818  AST 38  ALT 30  ALKPHOS 54  BILITOT 4.4*  PROT 5.7*  ALBUMIN 3.3*   No results for input(s): LIPASE, AMYLASE in the last 168 hours. No results for input(s): AMMONIA in the last 168 hours. Coagulation Profile: No results for input(s): INR, PROTIME in the last 168 hours. Cardiac Enzymes: Recent Labs  Lab 06/28/21 0818  CKTOTAL 311   BNP (last 3 results) No results for input(s): PROBNP  in the last 8760 hours. HbA1C: No results for input(s): HGBA1C in the last 72 hours. CBG: No results for input(s): GLUCAP in the last 168 hours. Lipid Profile: No results for input(s): CHOL, HDL, LDLCALC, TRIG, CHOLHDL, LDLDIRECT in the last 72 hours. Thyroid Function Tests: No results for input(s): TSH, T4TOTAL, FREET4, T3FREE, THYROIDAB in the last 72 hours. Anemia Panel: No results for input(s): VITAMINB12, FOLATE, FERRITIN, TIBC, IRON, RETICCTPCT in the last 72 hours. Sepsis Labs: No results for input(s): PROCALCITON, LATICACIDVEN in the last 168 hours.  Recent Results (from the past 240 hour(s))  Resp Panel by RT-PCR (Flu A&B, Covid) Nasopharyngeal Swab     Status: None   Collection Time: 06/28/21 12:49 PM   Specimen: Nasopharyngeal Swab; Nasopharyngeal(NP) swabs in vial transport medium  Result Value Ref Range Status   SARS Coronavirus 2 by RT PCR NEGATIVE NEGATIVE Final    Comment: (NOTE) SARS-CoV-2 target nucleic acids are NOT DETECTED.  The SARS-CoV-2 RNA is generally detectable in upper respiratory specimens during the acute phase of infection. The lowest concentration of SARS-CoV-2 viral copies this assay can detect is 138 copies/mL. A negative result does not preclude SARS-Cov-2 infection and should not be used as the sole basis for treatment or other patient management decisions. A negative result may occur with  improper specimen collection/handling, submission of specimen other than nasopharyngeal swab, presence of viral mutation(s) within the areas targeted by this assay, and inadequate number of viral copies(<138 copies/mL). A negative result must be combined with clinical observations, patient history, and epidemiological information. The expected result is Negative.  Fact Sheet for Patients:  EntrepreneurPulse.com.au  Fact Sheet for Healthcare Providers:  IncredibleEmployment.be  This test is no t yet approved or cleared  by the [place] FDA and  has been authorized for detection and/or diagnosis of SARS-CoV-2 by FDA under an Emergency Use Authorization (EUA). This EUA will remain  in effect (meaning this test can be used) for the duration of the COVID-19 declaration under Section 564(b)(1) of the Act, 21 U.S.C.section 360bbb-3(b)(1), unless the authorization is terminated  or revoked sooner.       Influenza A by PCR NEGATIVE NEGATIVE Final   Influenza B by PCR NEGATIVE NEGATIVE Final    Comment: (NOTE) The Xpert Xpress SARS-CoV-2/FLU/RSV plus assay is intended as an aid in the diagnosis of influenza from Nasopharyngeal swab  specimens and should not be used as a sole basis for treatment. Nasal washings and aspirates are unacceptable for Xpert Xpress SARS-CoV-2/FLU/RSV testing.  Fact Sheet for Patients: EntrepreneurPulse.com.au  Fact Sheet for Healthcare Providers: IncredibleEmployment.be  This test is not yet approved or cleared by the [place] FDA and has been authorized for detection and/or diagnosis of SARS-CoV-2 by FDA under an Emergency Use Authorization (EUA). This EUA will remain in effect (meaning this test can be used) for the duration of the COVID-19 declaration under Section 564(b)(1) of the Act, 21 U.S.C. section 360bbb-3(b)(1), unless the authorization is terminated or revoked.  Performed at [facility], [address] [place], [place] [address]   Blood culture (routine x 2)     Status: None (Preliminary result)   Collection Time: 06/28/21 12:49 PM   Specimen: BLOOD  Result Value Ref Range Status   Specimen Description BLOOD LEFT ANTECUBITAL  Final   Special Requests   Final    BOTTLES DRAWN AEROBIC AND ANAEROBIC Blood Culture adequate volume   Culture   Final    NO GROWTH 3 DAYS Performed at [facility], [address] [place], [place] [address]    Report Status PENDING  Incomplete  Blood culture (routine  x 2)     Status: None (Preliminary result)   Collection Time: 06/28/21 12:49 PM   Specimen: BLOOD  Result Value Ref Range Status   Specimen Description BLOOD BLOOD RIGHT HAND  Final   Special Requests   Final    BOTTLES DRAWN AEROBIC AND ANAEROBIC Blood Culture adequate volume   Culture   Final    NO GROWTH 3 DAYS Performed at [facility], [address] [place], [place] [address]    Report Status PENDING  Incomplete      Radiology Studies: No results found.   LOS: 3 days   Antonieta Pert, MD [facility]  07/01/2021, 10:42 AM

## 2021-07-02 DIAGNOSIS — I10 Essential (primary) hypertension: Secondary | ICD-10-CM | POA: Diagnosis not present

## 2021-07-02 DIAGNOSIS — N3946 Mixed incontinence: Secondary | ICD-10-CM | POA: Diagnosis not present

## 2021-07-02 DIAGNOSIS — E559 Vitamin D deficiency, unspecified: Secondary | ICD-10-CM | POA: Diagnosis not present

## 2021-07-02 DIAGNOSIS — M7989 Other specified soft tissue disorders: Secondary | ICD-10-CM | POA: Diagnosis not present

## 2021-07-02 DIAGNOSIS — I872 Venous insufficiency (chronic) (peripheral): Secondary | ICD-10-CM | POA: Diagnosis not present

## 2021-07-02 DIAGNOSIS — Z87891 Personal history of nicotine dependence: Secondary | ICD-10-CM | POA: Diagnosis not present

## 2021-07-02 DIAGNOSIS — D539 Nutritional anemia, unspecified: Secondary | ICD-10-CM | POA: Diagnosis not present

## 2021-07-02 DIAGNOSIS — D696 Thrombocytopenia, unspecified: Secondary | ICD-10-CM | POA: Diagnosis not present

## 2021-07-02 DIAGNOSIS — J449 Chronic obstructive pulmonary disease, unspecified: Secondary | ICD-10-CM | POA: Diagnosis not present

## 2021-07-02 DIAGNOSIS — R609 Edema, unspecified: Secondary | ICD-10-CM | POA: Diagnosis not present

## 2021-07-02 DIAGNOSIS — C801 Malignant (primary) neoplasm, unspecified: Secondary | ICD-10-CM | POA: Diagnosis not present

## 2021-07-02 DIAGNOSIS — Z7401 Bed confinement status: Secondary | ICD-10-CM | POA: Diagnosis not present

## 2021-07-02 DIAGNOSIS — R531 Weakness: Secondary | ICD-10-CM | POA: Diagnosis not present

## 2021-07-02 DIAGNOSIS — M6281 Muscle weakness (generalized): Secondary | ICD-10-CM | POA: Diagnosis not present

## 2021-07-02 DIAGNOSIS — Z79899 Other long term (current) drug therapy: Secondary | ICD-10-CM | POA: Diagnosis not present

## 2021-07-02 DIAGNOSIS — I4891 Unspecified atrial fibrillation: Secondary | ICD-10-CM | POA: Diagnosis not present

## 2021-07-02 DIAGNOSIS — L03119 Cellulitis of unspecified part of limb: Secondary | ICD-10-CM | POA: Diagnosis not present

## 2021-07-02 DIAGNOSIS — L03115 Cellulitis of right lower limb: Secondary | ICD-10-CM | POA: Diagnosis not present

## 2021-07-02 DIAGNOSIS — E669 Obesity, unspecified: Secondary | ICD-10-CM | POA: Diagnosis not present

## 2021-07-02 DIAGNOSIS — F419 Anxiety disorder, unspecified: Secondary | ICD-10-CM | POA: Diagnosis not present

## 2021-07-02 DIAGNOSIS — L89890 Pressure ulcer of other site, unstageable: Secondary | ICD-10-CM | POA: Diagnosis not present

## 2021-07-02 DIAGNOSIS — G4733 Obstructive sleep apnea (adult) (pediatric): Secondary | ICD-10-CM | POA: Diagnosis not present

## 2021-07-02 DIAGNOSIS — R1312 Dysphagia, oropharyngeal phase: Secondary | ICD-10-CM | POA: Diagnosis not present

## 2021-07-02 DIAGNOSIS — R600 Localized edema: Secondary | ICD-10-CM | POA: Diagnosis not present

## 2021-07-02 DIAGNOSIS — I739 Peripheral vascular disease, unspecified: Secondary | ICD-10-CM | POA: Diagnosis not present

## 2021-07-02 DIAGNOSIS — Z7982 Long term (current) use of aspirin: Secondary | ICD-10-CM | POA: Diagnosis not present

## 2021-07-02 DIAGNOSIS — R2689 Other abnormalities of gait and mobility: Secondary | ICD-10-CM | POA: Diagnosis not present

## 2021-07-02 DIAGNOSIS — R471 Dysarthria and anarthria: Secondary | ICD-10-CM | POA: Diagnosis not present

## 2021-07-02 DIAGNOSIS — C61 Malignant neoplasm of prostate: Secondary | ICD-10-CM | POA: Diagnosis not present

## 2021-07-02 DIAGNOSIS — R5381 Other malaise: Secondary | ICD-10-CM | POA: Diagnosis not present

## 2021-07-02 DIAGNOSIS — R279 Unspecified lack of coordination: Secondary | ICD-10-CM | POA: Diagnosis not present

## 2021-07-02 DIAGNOSIS — Z743 Need for continuous supervision: Secondary | ICD-10-CM | POA: Diagnosis not present

## 2021-07-02 DIAGNOSIS — L03116 Cellulitis of left lower limb: Secondary | ICD-10-CM | POA: Diagnosis not present

## 2021-07-02 DIAGNOSIS — Z8546 Personal history of malignant neoplasm of prostate: Secondary | ICD-10-CM | POA: Diagnosis not present

## 2021-07-02 LAB — RESP PANEL BY RT-PCR (FLU A&B, COVID) ARPGX2
Influenza A by PCR: NEGATIVE
Influenza B by PCR: NEGATIVE
SARS Coronavirus 2 by RT PCR: NEGATIVE

## 2021-07-02 MED ORDER — ALPRAZOLAM 0.25 MG PO TABS: 0.2500 mg | ORAL_TABLET | Freq: Every day | ORAL | 0 refills | Status: AC | PRN

## 2021-07-02 MED ORDER — CEPHALEXIN 500 MG PO CAPS: 500.0000 mg | ORAL_CAPSULE | Freq: Four times a day (QID) | ORAL | 0 refills | Status: AC

## 2021-07-02 NOTE — Discharge Summary (Signed)
Physician Discharge Summary  Earl Ramirez IAX:[id number] DOB: Oct 03, 1940 DOA: 06/28/2021  PCP: Baxter Hire, MD  Admit date: 06/28/2021 Discharge date: 07/02/2021  Admitted From: home Disposition:  SNF  Recommendations for Outpatient Follow-up:  Follow up with PCP in 1-2 weeks. W/ lymphedema clinic Please obtain BMP/CBC in one week Please follow up on the following pending results:  Home Health:NO  Equipment/Devices: NONE  Discharge Condition: Stable Code Status:   Code Status: DNR Diet recommendation:  Diet Order             Diet 2 gram sodium Room service appropriate? Yes; Fluid consistency: Thin  Diet effective now                    Brief/Interim Summary:  80 y.o. 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 fib, GERD, neuropathy, thrombocytopenia, chronic lymphedema involving his lower extremities who was recently discharged from the hospital following hospitalization for UTI, lower extremity cellulitis and weakness-very skilled nursing facility was recommended but declined by his insurance company and discharged home, now presents with increasing weakness last 2 days not being able to get out of his recliner due to weakness, fear of fall has not been able to eat or drink as he was not being able to bend down and get around the house and also with redness and swelling in both lower extremities. In the ED found to have generalized weakness along with cellulitis of lower extremities, DVT of the leg was negative on ultrasound. Patient was admitted for further management Placed on empiric antibiotics seen by wound care, also seen by PT OT, SNF has been recommended. Now on po antibiotics and legs are wrapped and improving he will cont with snf placement.  He will need to follow-up with lymphedema clinic.  At this time is medically stable for  discharge  Discharge Diagnoses:   Bilateral lower extremity cellulitis with blister: no DVT on duplex, does have chronic switched to oral Keflex , continue wound care follow-up with the lymphedema clinic.  Continue Lasix.     Gen weakness/debility/deconditioning, difficult to walk and get up at home: CK is normal, BNP 100.6.  In the setting of his lymphedema cellulitis along with multiple comorbidities recent hospitalization and deconditioning having weakness debility.  In significantly benefit with short-term skilled nursing facility placement. SNF was declined by insurance last time.  Insurance has approved for skilled nursing facility this admission.   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 Cont home Lasix.  Currently compensated   Stage IV prostate cancer history:on Xtandi.  Follow-up with urology as outpatient History of bladder outlet obstruction status post TURP: Continue Flomax.  Voiding.   History of A. fib on metoprolol for rate control not on anticoagulation due to thrombocytopenia.  Continue metoprolol with holding parameters   Class II Obesity:Patient's Body mass index is 38.84 kg/m. : Will benefit with PCP follow-up, weight loss  healthy lifestyle and outpatient sleep evaluation  Consults: Wound care  Subjective: Alert awake oriented resting comfortably.  Agreeable discharge to SNF today Discharge Exam: Vitals:   07/02/21 0351 07/02/21 0733  BP: (!) 109/58 110/72  Pulse: 89 84  Resp: 18 15  Temp: 98.3 F (36.8 C) 97.8 F (36.6 C)  SpO2: 97% 98%   General: Pt  is alert, awake, not in acute distress Cardiovascular: RRR, S1/S2 +, no rubs, no gallops Respiratory: CTA bilaterally, no wheezing, no rhonchi Abdominal: Soft, NT, ND, bowel sounds + Extremities: Bilateral chronic lymphedema, no cyanosis  Discharge  Instructions  Discharge Instructions     Discharge instructions   Complete by: As directed    Cbc basic metabolic panel in 1 wk  Please call call MD or return to ER for similar or worsening recurring problem that brought you to hospital or if any fever,nausea/vomiting,abdominal pain, uncontrolled pain, chest pain,  shortness of breath or any other alarming symptoms.  Please follow-up your doctor as instructed in a week time and call the office for appointment.  Follow up with lymphedema clinic   Please avoid alcohol, smoking, or any other illicit substance and maintain healthy habits including taking your regular medications as prescribed.  You were cared for by a hospitalist during your hospital stay. If you have any questions about your discharge medications or the care you received while you were in the hospital after you are discharged, you can call the unit and ask to speak with the hospitalist on call if the hospitalist that took care of you is not available.  Once you are discharged, your primary care physician will handle any further medical issues. Please note that NO REFILLS for any discharge medications will be authorized once you are discharged, as it is imperative that you return to your primary care physician (or establish a relationship with a primary care physician if you do not have one) for your aftercare needs so that they can reassess your need for medications and monitor your lab values   Discharge wound care:   Complete by: As directed    Single layer of xeroform over the open blisters/weeping. Wrap from toes to knees with kerlix and then from toes to knees with Coban (making sure to wrap to the patellar notch).  Change M/W/F   Increase activity slowly   Complete by: As directed       Allergies as of 07/02/2021       Reactions   Oxycodone-acetaminophen Nausea Only, Shortness Of Breath   Oxycodone-acetaminophen    Other reaction(s): Drowsy   Amoxicillin Itching    Hand itching   Penicillins Rash   Did it involve swelling of the face/tongue/throat, SOB, or low BP? No Did it involve sudden or severe rash/hives, skin peeling, or any reaction on the inside of your mouth or nose? No Did you need to seek medical attention at a hospital or doctor's office? No When did it last happen?      4-5 years ago If all above answers are "NO", may proceed with cephalosporin use.        Medication List     TAKE these medications    ALPRAZolam 0.25 MG tablet Commonly known as: XANAX Take 1 tablet (0.25 mg total) by mouth daily as needed for up to 4 doses for anxiety.   aspirin EC 81 MG tablet Take 81 mg by mouth daily.   calcium carbonate 500 MG chewable tablet Commonly known as: TUMS - dosed in mg elemental calcium Chew 1 tablet by mouth daily.   cephALEXin 500 MG capsule Commonly known as: KEFLEX Take 1 capsule (500 mg total) by mouth every 6 (six) hours for 5 days. What changed:  when to take this additional instructions   docusate sodium 100 MG capsule Commonly known as: COLACE Take 100 mg by mouth daily as needed for mild  constipation.   furosemide 20 MG tablet Commonly known as: LASIX Take 20 mg by mouth daily.   gabapentin 300 MG capsule Commonly known as: NEURONTIN Take 900 mg by mouth 4 (four) times daily.   metoprolol succinate 25 MG 24 hr tablet Commonly known as: TOPROL-XL Take 25 mg by mouth daily.   mirabegron ER 50 MG Tb24 tablet Commonly known as: MYRBETRIQ Take 1 tablet (50 mg total) by mouth daily.   tamsulosin 0.4 MG Caps capsule Commonly known as: FLOMAX Take 1 capsule by mouth twice daily   Xtandi 40 MG tablet Generic drug: enzalutamide Take 4 tablets (160 mg total) by mouth daily.               Discharge Care Instructions  (From admission, onward)           Start     Ordered   07/02/21 0000  Discharge wound care:       Comments: Single layer of xeroform over the open blisters/weeping. Wrap  from toes to knees with kerlix and then from toes to knees with Coban (making sure to wrap to the patellar notch).  Change M/W/F   07/02/21 0959            Contact information for after-discharge care     [facility] .   Service: Skilled Nursing Contact information: [place] [address] [phone]                    Allergies  Allergen Reactions   Oxycodone-Acetaminophen Nausea Only and Shortness Of Breath   Oxycodone-Acetaminophen     Other reaction(s): Drowsy   Amoxicillin Itching    Hand itching   Penicillins Rash    Did it involve swelling of the face/tongue/throat, SOB, or low BP? No Did it involve sudden or severe rash/hives, skin peeling, or any reaction on the inside of your mouth or nose? No Did you need to seek medical attention at a hospital or doctor's office? No When did it last happen?      4-5 years ago If all above answers are "NO", may proceed with cephalosporin use.     The results of significant diagnostics from this hospitalization (including imaging, microbiology, ancillary and laboratory) are listed below for reference.    Microbiology: Recent Results (from the past 240 hour(s))  Resp Panel by RT-PCR (Flu A&B, Covid) Nasopharyngeal Swab     Status: None   Collection Time: 06/28/21 12:49 PM   Specimen: Nasopharyngeal Swab; Nasopharyngeal(NP) swabs in vial transport medium  Result Value Ref Range Status   SARS Coronavirus 2 by RT PCR NEGATIVE NEGATIVE Final    Comment: (NOTE) SARS-CoV-2 target nucleic acids are NOT DETECTED.  The SARS-CoV-2 RNA is generally detectable in upper respiratory specimens during the acute phase of infection. The lowest concentration of SARS-CoV-2 viral copies this assay can detect is 138 copies/mL. A negative result does not preclude SARS-Cov-2 infection and should not be used as the sole basis for treatment or other patient  management decisions. A negative result may occur with  improper specimen collection/handling, submission of specimen other than nasopharyngeal swab, presence of viral mutation(s) within the areas targeted by this assay, and inadequate number of viral copies(<138 copies/mL). A negative result must be combined with clinical observations, patient history, and epidemiological information. The expected result is Negative.  Fact Sheet for Patients:  EntrepreneurPulse.com.au  Fact Sheet for Healthcare Providers:  IncredibleEmployment.be  This test is no t yet approved or cleared by the [place] and  has been authorized for detection and/or diagnosis of SARS-CoV-2 by FDA under an Emergency Use Authorization (EUA). This EUA will remain  in effect (meaning this test can be used) for the duration of the COVID-19 declaration under Section 564(b)(1) of the Act, 21 U.S.C.section 360bbb-3(b)(1), unless the authorization is terminated  or revoked sooner.       Influenza A by PCR NEGATIVE NEGATIVE Final   Influenza B by PCR NEGATIVE NEGATIVE Final    Comment: (NOTE) The Xpert Xpress SARS-CoV-2/FLU/RSV plus assay is intended as an aid in the diagnosis of influenza from Nasopharyngeal swab specimens and should not be used as a sole basis for treatment. Nasal washings and aspirates are unacceptable for Xpert Xpress SARS-CoV-2/FLU/RSV testing.  Fact Sheet for Patients: EntrepreneurPulse.com.au  Fact Sheet for Healthcare Providers: IncredibleEmployment.be  This test is not yet approved or cleared by the [place] FDA and has been authorized for detection and/or diagnosis of SARS-CoV-2 by FDA under an Emergency Use Authorization (EUA). This EUA will remain in effect (meaning this test can be used) for the duration of the COVID-19 declaration under Section 564(b)(1) of the Act, 21 U.S.C. section 360bbb-3(b)(1),  unless the authorization is terminated or revoked.  Performed at [facility], [address] [place], [place] [address]   Blood culture (routine x 2)     Status: None (Preliminary result)   Collection Time: 06/28/21 12:49 PM   Specimen: BLOOD  Result Value Ref Range Status   Specimen Description BLOOD LEFT ANTECUBITAL  Final   Special Requests   Final    BOTTLES DRAWN AEROBIC AND ANAEROBIC Blood Culture adequate volume   Culture   Final    NO GROWTH 4 DAYS Performed at [facility], [address], [place], [place] [address]    Report Status PENDING  Incomplete  Blood culture (routine x 2)     Status: None (Preliminary result)   Collection Time: 06/28/21 12:49 PM   Specimen: BLOOD  Result Value Ref Range Status   Specimen Description BLOOD BLOOD RIGHT HAND  Final   Special Requests   Final    BOTTLES DRAWN AEROBIC AND ANAEROBIC Blood Culture adequate volume   Culture   Final    NO GROWTH 4 DAYS Performed at [facility], [address], [place], [place] [address]    Report Status PENDING  Incomplete    Procedures/Studies: CT Cervical Spine Wo Contrast  Result Date: 06/18/2021 CLINICAL DATA:  Generalized weakness, unable to get out of bed, incontinence. Patient is undergoing treatment for prostate cancer. Metastatic disease evaluation EXAM: CT CERVICAL SPINE WITHOUT CONTRAST TECHNIQUE: Multidetector CT imaging of the cervical spine was performed without intravenous contrast. Multiplanar CT image reconstructions were also generated. COMPARISON:  None. FINDINGS: Alignment: Normal. Skull base and vertebrae: Skull base alignment is maintained. Vertebral body heights are preserved. There is no evidence of acute fracture. There is no suspicious osseous lesion. Soft tissues and spinal canal: There is no definite abnormal soft tissue within the spinal canal. The paraspinal soft tissues are unremarkable. Disc levels: There is mild multilevel disc space  narrowing with associated endplate irregularity and endplate osteophytes, most prominent at C6-C7. There is multilevel uncovertebral and facet arthropathy. Findings result in mild spinal canal stenosis at C6-C7 and multilevel moderate to severe neural foraminal stenosis including on the left at C2-C3, bilaterally at C3-C4, on the right at C4-C5, and bilaterally at C6-C7.  Upper chest: A right pleural effusion is partially imaged. Other: There is a peripherally calcified right thyroid nodule measuring 1.5 cm. IMPRESSION: 1. No suspicious osseous lesion identified in the cervical spine. Please also refer to the separately dictated CT thoracic and lumbar spine. 2. Multilevel degenerative changes as above. 3. Partially imaged right pleural effusion. 4. 1.5 cm right thyroid nodule. Recommend nonemergent thyroid ultrasound if not already performed. Electronically Signed   By: Valetta Mole M.D.   On: 06/18/2021 08:46   CT Thoracic Spine Wo Contrast  Result Date: 06/18/2021 CLINICAL DATA:  Weakness, suspected metastatic disease. EXAM: CT THORACIC SPINE WITHOUT CONTRAST TECHNIQUE: Multidetector CT images of the thoracic were obtained using the standard protocol without intravenous contrast. COMPARISON:  PET-CT 01/23/2021 FINDINGS: Alignment: There is slightly exaggerated thoracic kyphosis. There is no antero or retrolisthesis. Vertebrae: Vertebral body heights are preserved. There is no definite suspicious lesion in the vertebral bodies. There is no evidence of acute fracture. There is a sclerotic lesion in the proximal right fourth rib at the costovertebral junction consistent with metastatic disease as seen on the prior PET-CT (4-49). There is a sclerotic lesion in the posterior right seventh rib, also seen on the prior PET (4-96). Paraspinal and other soft tissues: The paraspinal soft tissues are unremarkable. There is no definite abnormal soft tissue within the canal, though this is suboptimally evaluated by CT. Disc  levels: There is multilevel degenerative endplate change and mild facet arthropathy throughout the thoracic spine. The osseous spinal canal and neural foramina are patent. Other: There is a moderate right pleural effusion. The ascending thoracic aorta appears mildly ectatic measuring up to 4.3 cm. There is extensive calcified atherosclerotic plaque throughout the thoracic aorta. There are scattered prominent mediastinal lymph nodes, grossly similar to the prior PET-CT. IMPRESSION: 1. No definite evidence of osseous metastatic disease in the thoracic spine. 2. Unchanged sclerotic lesions in the right fourth rib at the costovertebral junction and posterior seventh rib compared to the prior PET-CT from 01/23/2021 3. Mild multilevel degenerative change. The osseous spinal canal and neural foramina are patent. 4. Moderate right pleural effusion. 5. Mildly enlarged ascending thoracic aorta measuring up to 4.3 cm Recommend annual imaging followup by CTA or MRA. This recommendation follows 2010 ACCF/AHA/AATS/ACR/ASA/SCA/SCAI/SIR/STS/SVM Guidelines for the Diagnosis and Management of Patients with Thoracic Aortic Disease. Circulation. 2010; 121: S505-L976. Aortic aneurysm NOS (ICD10-I71.9) Electronically Signed   By: Valetta Mole M.D.   On: 06/18/2021 08:57   MR Brain W and Wo Contrast  Result Date: 06/18/2021 CLINICAL DATA:  Brain mass or lesion. EXAM: MRI HEAD WITHOUT AND WITH CONTRAST TECHNIQUE: Multiplanar, multiecho pulse sequences of the brain and surrounding structures were obtained without and with intravenous contrast. CONTRAST:  29mL GADAVIST GADOBUTROL 1 MMOL/ML IV SOLN COMPARISON:  None similar FINDINGS: Brain: No acute infarction, hemorrhage, hydrocephalus, extra-axial collection or mass lesion. Preserved brain volume. Mild chronic ischemic gliosis in the deep white matter. Few remote microhemorrhages asymmetric to the left hemisphere, possibly from prior trauma. Vascular: None normal flow voids and  vascular enhancement Skull and upper cervical spine: Mild heterogeneity of marrow the skull base and upper cervical spine without discrete lesion. Sinuses/Orbits: Negative IMPRESSION: Age normal brain MRI Electronically Signed   By: Jorje Guild M.D.   On: 06/18/2021 07:02   MR LUMBAR SPINE WO CONTRAST  Result Date: 06/18/2021 CLINICAL DATA:  Cord compression EXAM: MRI LUMBAR SPINE WITHOUT CONTRAST TECHNIQUE: Multiplanar, multisequence MR imaging of the lumbar spine was performed. No intravenous contrast was administered.  COMPARISON:  No prior MRI, correlation is made with CT lumbar spine 06/18/2021 and 01/23/2021 PET. FINDINGS: Segmentation: Sacralization of L5, with the last complete disc space at L5-S1. Alignment:  Mild dextrocurvature.  Otherwise physiologic. Vertebrae: No acute fracture or suspicious osseous lesion. Signal changes in L5 and the sacrum, consistent with prior radiation. Otherwise heterogeneous marrow signal T11-L4. Congenitally short pedicles, which narrow the AP diameter of the spinal canal. Conus medullaris and cauda equina: Conus extends to the L1 level. Conus and cauda equina appear normal. Paraspinal and other soft tissues: Edema in the subcutaneous fat. Otherwise negative. Disc levels: T12-L1: No significant disc bulge. No spinal canal stenosis or neural foraminal narrowing. L1-L2: Mild disc desiccation without significant disc bulge. Mild facet arthropathy. No spinal canal stenosis. Mild left-greater-than-right neural foraminal narrowing. L2-L3: Disc desiccation without significant disc bulge. Moderate facet arthropathy. No spinal canal stenosis. Mild left greater than right neural foraminal narrowing. L3-L4: Disc desiccation and moderate disc bulge. Mild facet arthropathy. No spinal canal stenosis. Moderate to severe bilateral neural foraminal narrowing. L4-L5: Moderate disc bulge with left foraminal and extreme lateral protrusion. Severe facet arthropathy. Ligamentum flavum  hypertrophy. Moderate spinal canal stenosis. Narrowing of the lateral recesses. Severe bilateral neural foraminal narrowing. L5-S1: No significant disc bulge. No spinal canal stenosis or neural foraminal narrowing. IMPRESSION: 1. L4-L5 moderate spinal canal stenosis and severe bilateral neural foraminal narrowing. Narrowing of the lateral recesses could affect the descending L5 nerves. 2. L3-L4 moderate to severe bilateral neural foraminal narrowing. 3. L1-L2 and L2-L3 mild left-greater-than-right neural foraminal narrowing. 4. No focal suspicious osseous lesion or evidence of cord compression. Heterogeneous marrow signal, which is nonspecific but can be seen in the setting of diabetes, obesity, tobacco use, or a myeloproliferative disorder. Electronically Signed   By: Merilyn Baba M.D.   On: 06/18/2021 13:33   CT ABDOMEN PELVIS W CONTRAST  Addendum Date: 06/18/2021   ADDENDUM REPORT: 06/18/2021 09:26 ADDENDUM: There is small sclerotic density without break in the cortical margins in the anterior medial aspect of right acetabulum suggesting sclerotic metastatic disease. Electronically Signed   By: Elmer Picker M.D.   On: 06/18/2021 09:26   Result Date: 06/18/2021 CLINICAL DATA:  Metastatic disease evaluation, weakness in the both lower extremities EXAM: CT ABDOMEN AND PELVIS WITH CONTRAST TECHNIQUE: Multidetector CT imaging of the abdomen and pelvis was performed using the standard protocol following bolus administration of intravenous contrast. CONTRAST:  145mL OMNIPAQUE IOHEXOL 300 MG/ML  SOLN COMPARISON:  PET-CT done on 01/23/2021 FINDINGS: Lower chest: There is 1.3 cm nodular density in the right lower lobe. There are patchy infiltrates in the lower lung fields, more so in the right lower lobe. Small right pleural effusion is seen. There is minimal left pleural effusion. There are ectatic vessels at the gastroesophageal junction suggesting varices related to portal hypertension. Hepatobiliary:  Liver measures 12.6 cm in length. There is nodularity in the liver surface suggesting cirrhosis. There is no dilation of bile ducts. Gallbladder stones are seen. Pancreas: No focal abnormality is seen. Spleen: Spleen is enlarged measuring 17.6 cm in length. There is 1.3 cm low-density in the spleen. This may suggest incidental benign process such as cyst or hemangioma. Less likely possibility would be neoplastic process. Adrenals/Urinary Tract: Adrenals are unremarkable. There is no hydronephrosis. There is 2.1 cm cyst in the anterior midportion of left kidney. There is 2 cm ill-defined low-density in the posterior midportion of left kidney with density measurements higher than usual for simple cyst. There are no definite  renal or ureteral stones. In image 26 of series 2, there is questionable subtle increased density in the central portion of the right kidney. Urinary bladder is unremarkable. Stomach/Bowel: Small hiatal hernia is seen. Diverticulum is noted along the inner margin of duodenum. Small bowel loops are not dilated. Appendix is not distinctly seen. There is no significant wall thickening in colon. Scattered diverticula are seen. Vascular/Lymphatic: There are subcentimeter nodes in the retroperitoneum with no significant interval change. Reproductive: There is contrast in the lumen of prostatic urethra, possibly suggesting previous intervention. Prostate is not enlarged. Other: There is no ascites or pneumoperitoneum. Musculoskeletal: No definite focal lytic or sclerotic lesions are seen. IMPRESSION: Cirrhosis. Splenomegaly. 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 There is 1.3 cm nodular density in the right lower lung fields which may suggest focal pneumonia or neoplastic process. Bilateral pleural effusions are seen, more so on the right side. There is no evidence of intestinal obstruction or pneumoperitoneum. There is no hydronephrosis.  There is a 2 cm low-density lesion with indistinct margins and higher than usual density measurement for simple cyst in the posterior midportion of left kidney. This may suggest hemorrhagic cyst or neoplastic process. There are no focal lytic or sclerotic lesions in the bony structures. No new significant lymphadenopathy seen. Gallbladder stones.  There is no dilation of bile ducts. Other findings as described in the body of the report. Electronically Signed: By: Elmer Picker M.D. On: 06/18/2021 09:06   US Venous Img Lower Unilateral Left (DVT)  Result Date: 06/28/2021 CLINICAL DATA:  80 year old male with LEFT LOWER extremity pain and swelling. EXAM: LEFT LOWER EXTREMITY VENOUS DOPPLER ULTRASOUND TECHNIQUE: Gray-scale sonography with compression, as well as color and duplex ultrasound, were performed to evaluate the deep venous system(s) from the level of the common femoral vein through the popliteal and proximal calf veins. COMPARISON:  None. FINDINGS: VENOUS Normal compressibility of the common femoral, superficial femoral, and popliteal veins, as well as the visualized calf veins. Visualized portions of profunda femoral vein and great saphenous vein unremarkable. No filling defects to suggest DVT on grayscale or color Doppler imaging. Doppler waveforms show normal direction of venous flow, normal respiratory plasticity and response to augmentation. Limited views of the contralateral common femoral vein are unremarkable. OTHER None. Limitations: Calf veins IMPRESSION: Negative. Electronically Signed   By: Margarette Canada M.D.   On: 06/28/2021 15:04   CT L-SPINE NO CHARGE  Result Date: 06/18/2021 CLINICAL DATA:  Weakness, concern for osseous metastatic disease EXAM: CT LUMBAR SPINE WITHOUT CONTRAST TECHNIQUE: Multidetector CT imaging of the lumbar spine was performed without intravenous contrast administration. Multiplanar CT image reconstructions were also generated. COMPARISON:  PET-CT 01/23/2021  FINDINGS: Segmentation: Standard; the lowest disc space is designated L5-S1. Alignment: Normal. Vertebrae: Vertebral body heights are preserved. There is no evidence of acute fracture. There is no suspicious osseous lesion involving the lumbar vertebral bodies. There is a sclerotic lesion in the right acetabulum consistent with the known metastatic lesion seen on the prior PET-CT (1-182). Paraspinal and other soft tissues: The paraspinal soft tissues are unremarkable. The abdominal and pelvic viscera are evaluated on the separately dictated CT abdomen/pelvis. Disc levels: There is mild multilevel intervertebral disc space narrowing. There is multilevel degenerative endplate change, most advanced in the lower thoracic spine through T12-L1, L3-L4, and L4-L5. There is multilevel facet arthropathy, most advanced at L4-L5. T12-L1: No significant spinal canal or neural foraminal stenosis. L1-L2: No significant spinal canal or neural  foraminal stenosis. L2-L3: There is mild bilateral facet arthropathy, degenerative endplate change, and ligamentum flavum thickening resulting in mild spinal canal stenosis and mild bilateral neural foraminal stenosis. L3-L4: There is a diffuse disc bulge, degenerative endplate change, bilateral facet arthropathy, and ligamentum flavum thickening resulting in moderate spinal canal stenosis and mild-to-moderate bilateral neural foraminal stenosis. L4-L5: There is a diffuse disc bulge, degenerative endplate change and bilateral facet arthropathy, and ligamentum flavum thickening resulting in moderate spinal canal stenosis and severe right and moderate left neural foraminal stenosis L5-S1: No significant spinal canal or neural foraminal stenosis. IMPRESSION: 1. No evidence of osseous metastatic disease in the lumbar spine. 2. 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 3. Multilevel degenerative changes as above, most advanced at L3-L4 where there is moderate spinal canal  stenosis and mild-to-moderate bilateral neural foraminal stenosis, and L4-L5 where there is moderate spinal canal stenosis and severe right and moderate left neural foraminal stenosis. 4. Please also refer to the separately dictated CT abdomen/pelvis. Electronically Signed   By: Valetta Mole M.D.   On: 06/18/2021 09:49   DG Chest Portable 1 View  Result Date: 06/28/2021 CLINICAL DATA:  Edema EXAM: PORTABLE CHEST 1 VIEW COMPARISON:  Chest x-ray 06/18/2021 FINDINGS: Heart is enlarged. Mediastinum appears stable. Calcified plaques in the aortic arch. Median sternotomy wires. Mild central pulmonary vascular prominence similar to previous study. No focal consolidation identified. No significant pleural effusion or pneumothorax. IMPRESSION: Cardiomegaly and stable mild central pulmonary vascular prominence. Electronically Signed   By: Ofilia Neas M.D.   On: 06/28/2021 13:30   DG Chest Portable 1 View  Result Date: 06/18/2021 CLINICAL DATA:  Weakness in both legs EXAM: PORTABLE CHEST 1 VIEW COMPARISON:  PET-CT 01/23/2021, CT chest 11/30/2018 FINDINGS: Median sternotomy wires are again seen. The first and third wires are fractured, unchanged. The heart is enlarged. There is calcified atherosclerotic plaque of the aortic arch. The mediastinum is prominent, likely at least in part due to dilation of the ascending thoracic aorta as seen on prior cross-sectional imaging. There is pulmonary vascular congestion and possible mild pulmonary interstitial edema. There is a trace right pleural effusion. There is no left effusion. There is no focal consolidation. There is no acute osseous abnormality. IMPRESSION: 1. Trace right pleural effusion and possible mild pulmonary interstitial edema. 2. Cardiomegaly. 3. Prominent mediastinal contours likely at least in part due to ascending thoracic aortic dilation as seen on prior cross-sectional imaging. Electronically Signed   By: Valetta Mole M.D.   On: 06/18/2021 08:07    US ABDOMEN LIMITED RUQ (LIVER/GB)  Result Date: 06/18/2021 CLINICAL DATA:  Hyperbilirubinemia EXAM: ULTRASOUND ABDOMEN LIMITED RIGHT UPPER QUADRANT COMPARISON:  10/18/2018 FINDINGS: Gallbladder: Chronic cholelithiasis. Full gallbladder but no wall thickening or focal tenderness. The largest stone measures nearly 3 cm. Common bile duct: Diameter: 3 mm Liver: Heterogeneous liver correlating with history of cirrhosis. No focal lesion identified. Portal vein is patent on color Doppler imaging with normal direction of blood flow towards the liver. Other: Trace ascites around the liver. IMPRESSION: Cirrhosis and cholelithiasis, also seen in 2020 by MRI. No acute finding. Electronically Signed   By: Jorje Guild M.D.   On: 06/18/2021 08:22    Labs: BNP (last 3 results) Recent Labs    06/18/21 0652 06/28/21 0818  BNP 300.5* 856.3*   Basic Metabolic Panel: Recent Labs  Lab 06/28/21 0818 06/29/21 0816  NA 136 135  K 4.1 3.4*  CL 101 102  CO2 27 27  GLUCOSE 127* 86  BUN 16 16  CREATININE 0.88 0.63  CALCIUM 8.0* 7.3*   Liver Function Tests: Recent Labs  Lab 06/28/21 0818  AST 38  ALT 30  ALKPHOS 54  BILITOT 4.4*  PROT 5.7*  ALBUMIN 3.3*   No results for input(s): LIPASE, AMYLASE in the last 168 hours. No results for input(s): AMMONIA in the last 168 hours. CBC: Recent Labs  Lab 06/28/21 0818 06/29/21 0816 06/30/21 0628  WBC 11.1* 4.9 3.5*  HGB 13.2 11.4* 11.5*  HCT 38.4* 33.2* 33.0*  MCV 104.3* 105.1* 103.4*  PLT 49* 34* 31*   Cardiac Enzymes: Recent Labs  Lab 06/28/21 0818  CKTOTAL 311   BNP: Invalid input(s): POCBNP CBG: No results for input(s): GLUCAP in the last 168 hours. D-Dimer No results for input(s): DDIMER in the last 72 hours. Hgb A1c No results for input(s): HGBA1C in the last 72 hours. Lipid Profile No results for input(s): CHOL, HDL, LDLCALC, TRIG, CHOLHDL, LDLDIRECT in the last 72 hours. Thyroid function studies No results for input(s):  TSH, T4TOTAL, T3FREE, THYROIDAB in the last 72 hours.  Invalid input(s): FREET3 Anemia work up No results for input(s): VITAMINB12, FOLATE, FERRITIN, TIBC, IRON, RETICCTPCT in the last 72 hours. Urinalysis    Component Value Date/Time   COLORURINE YELLOW 06/28/2021 1440   APPEARANCEUR CLEAR (A) 06/28/2021 1440   APPEARANCEUR Cloudy (A) 05/22/2021 1348   LABSPEC 1.025 06/28/2021 1440   PHURINE 5.5 06/28/2021 1440   GLUCOSEU NEGATIVE 06/28/2021 1440   HGBUR NEGATIVE 06/28/2021 1440   BILIRUBINUR SMALL (A) 06/28/2021 1440   BILIRUBINUR Negative 05/22/2021 [place] 06/28/2021 1440   PROTEINUR 30 (A) 06/28/2021 1440   NITRITE NEGATIVE 06/28/2021 1440   LEUKOCYTESUR NEGATIVE 06/28/2021 1440   Sepsis Labs Invalid input(s): PROCALCITONIN,  WBC,  LACTICIDVEN Microbiology Recent Results (from the past 240 hour(s))  Resp Panel by RT-PCR (Flu A&B, Covid) Nasopharyngeal Swab     Status: None   Collection Time: 06/28/21 12:49 PM   Specimen: Nasopharyngeal Swab; Nasopharyngeal(NP) swabs in vial transport medium  Result Value Ref Range Status   SARS Coronavirus 2 by RT PCR NEGATIVE NEGATIVE Final    Comment: (NOTE) SARS-CoV-2 target nucleic acids are NOT DETECTED.  The SARS-CoV-2 RNA is generally detectable in upper respiratory specimens during the acute phase of infection. The lowest concentration of SARS-CoV-2 viral copies this assay can detect is 138 copies/mL. A negative result does not preclude SARS-Cov-2 infection and should not be used as the sole basis for treatment or other patient management decisions. A negative result may occur with  improper specimen collection/handling, submission of specimen other than nasopharyngeal swab, presence of viral mutation(s) within the areas targeted by this assay, and inadequate number of viral copies(<138 copies/mL). A negative result must be combined with clinical observations, patient history, and  epidemiological information. The expected result is Negative.  Fact Sheet for Patients:  EntrepreneurPulse.com.au  Fact Sheet for Healthcare Providers:  IncredibleEmployment.be  This test is no t yet approved or cleared by the [place] FDA and  has been authorized for detection and/or diagnosis of SARS-CoV-2 by FDA under an Emergency Use Authorization (EUA). This EUA will remain  in effect (meaning this test can be used) for the duration of the COVID-19 declaration under Section 564(b)(1) of the Act, 21 U.S.C.section 360bbb-3(b)(1), unless the authorization is terminated  or revoked sooner.       Influenza A by PCR NEGATIVE NEGATIVE Final   Influenza B by PCR NEGATIVE  NEGATIVE Final    Comment: (NOTE) The Xpert Xpress SARS-CoV-2/FLU/RSV plus assay is intended as an aid in the diagnosis of influenza from Nasopharyngeal swab specimens and should not be used as a sole basis for treatment. Nasal washings and aspirates are unacceptable for Xpert Xpress SARS-CoV-2/FLU/RSV testing.  Fact Sheet for Patients: EntrepreneurPulse.com.au  Fact Sheet for Healthcare Providers: IncredibleEmployment.be  This test is not yet approved or cleared by the [place] FDA and has been authorized for detection and/or diagnosis of SARS-CoV-2 by FDA under an Emergency Use Authorization (EUA). This EUA will remain in effect (meaning this test can be used) for the duration of the COVID-19 declaration under Section 564(b)(1) of the Act, 21 U.S.C. section 360bbb-3(b)(1), unless the authorization is terminated or revoked.  Performed at [facility], [address] [place], [place] [address]   Blood culture (routine x 2)     Status: None (Preliminary result)   Collection Time: 06/28/21 12:49 PM   Specimen: BLOOD  Result Value Ref Range Status   Specimen Description BLOOD LEFT ANTECUBITAL  Final   Special  Requests   Final    BOTTLES DRAWN AEROBIC AND ANAEROBIC Blood Culture adequate volume   Culture   Final    NO GROWTH 4 DAYS Performed at [facility], [address], [place], [place] [address]    Report Status PENDING  Incomplete  Blood culture (routine x 2)     Status: None (Preliminary result)   Collection Time: 06/28/21 12:49 PM   Specimen: BLOOD  Result Value Ref Range Status   Specimen Description BLOOD BLOOD RIGHT HAND  Final   Special Requests   Final    BOTTLES DRAWN AEROBIC AND ANAEROBIC Blood Culture adequate volume   Culture   Final    NO GROWTH 4 DAYS Performed at [facility], [address] [place], [place] [address]    Report Status PENDING  Incomplete   Time coordinating discharge: 25 minutes  SIGNED: Antonieta Pert, MD  [facility] 07/02/2021, 10:00 AM  If 7PM-7AM, please contact night-coverage [email or web address]

## 2021-07-02 NOTE — Care Management Important Message (Signed)
Important Message  Patient Details  Name: Earl Ramirez MRN: [id number] Date of Birth: 06-May-1941   Medicare Important Message Given:  Yes     [name] 07/02/2021, 11:37 AM

## 2021-07-02 NOTE — Progress Notes (Signed)
Physical Therapy Treatment Patient Details Name: Earl Ramirez MRN: [id number] DOB: 05/15/1941 Today's Date: 07/02/2021   History of Present Illness Earl Ramirez, 80 y.o. 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 fib, GERD, neuropathy, thrombocytopenia, chronic lymphedema involving his lower extremities who was recently discharged from the hospital on 06/22/21. Now admitted on 06/28/21 for generalized weakness along with cellulitis of lower extremities.    PT Comments    Pt received supine in bed, agreeable to therapy. Pt required MIN-MOD A throughout session for all mobility. This session, pt was able to stand and perform stand pivot to recliner with occasional knee buckling due to LE weakness. Ambulation was not attempted due to safety concern with knee buckling. Extended time was spent sitting EOB for pt to obtain fair sitting balance prior to standing activities. Pt continues to present with significant weakness and muscular fatigue limiting ability to complete functional tasks safely. Would benefit from skilled PT to address above deficits and promote optimal return to PLOF.    Recommendations for follow up therapy are one component of a multi-disciplinary discharge planning process, led by the attending physician.  Recommendations may be updated based on patient status, additional functional criteria and insurance authorization.  Follow Up Recommendations  Skilled nursing-short term rehab (<3 hours/day)     Assistance Recommended at Discharge Frequent or constant Supervision/Assistance  Equipment Recommendations  None recommended by PT    Recommendations for Other Services       Precautions / Restrictions Precautions Precautions: Fall     Mobility  Bed Mobility Overal bed mobility: Needs Assistance Bed Mobility: Supine to Sit     Supine to sit:  Mod assist;HOB elevated     General bed mobility comments: assist for trunk lift and pivot of hips    Transfers Overall transfer level: Needs assistance Equipment used: Rolling walker (2 wheels) Transfers: Sit to/from Stand;Bed to chair/wheelchair/BSC Sit to Stand: Min assist;From elevated surface Stand pivot transfers: Min assist;From elevated surface         General transfer comment: MIN A to ensure standing stability due to occasional knee buckle. Heavy reliance on UE support of RW.    Ambulation/Gait               General Gait Details: deferred at this time   Stairs             Wheelchair Mobility    Modified Rankin (Stroke Patients Only)       Balance Overall balance assessment: Needs assistance Sitting-balance support: Feet supported Sitting balance-Leahy Scale: Fair Sitting balance - Comments: initially reaching for UE support to maintain balance, progressed to no UE support   Standing balance support: Bilateral upper extremity supported;During functional activity;Reliant on assistive device for balance Standing balance-Leahy Scale: Poor Standing balance comment: occasional knee buckle required MIN A to prevent during stand pivot                            Cognition Arousal/Alertness: Awake/alert Behavior During Therapy: WFL for tasks assessed/performed Overall Cognitive Status: Within Functional Limits for tasks assessed                                          Exercises  General Comments        Pertinent Vitals/Pain Pain Assessment: No/denies pain    Home Living                          Prior Function            PT Goals (current goals can now be found in the care plan section) Acute Rehab PT Goals Patient Stated Goal: to go to rehab and get strength back PT Goal Formulation: With patient Time For Goal Achievement: 07/13/21 Potential to Achieve Goals: Fair    Frequency    Min  2X/week      PT Plan      Co-evaluation              AM-PAC PT "6 Clicks" Mobility   Outcome Measure  Help needed turning from your back to your side while in a flat bed without using bedrails?: A Little Help needed moving from lying on your back to sitting on the side of a flat bed without using bedrails?: A Lot Help needed moving to and from a bed to a chair (including a wheelchair)?: A Little Help needed standing up from a chair using your arms (e.g., wheelchair or bedside chair)?: A Little Help needed to walk in hospital room?: A Lot Help needed climbing 3-5 steps with a railing? : Total 6 Click Score: 14    End of Session Equipment Utilized During Treatment: Gait belt Activity Tolerance: Patient limited by fatigue Patient left: with call bell/phone within reach;in chair;with chair alarm set Nurse Communication: Mobility status PT Visit Diagnosis: Unsteadiness on feet (R26.81);Muscle weakness (generalized) (M62.81);History of falling (Z91.81);Difficulty in walking, not elsewhere classified (R26.2)     Time: 9470-9628 PT Time Calculation (min) (ACUTE ONLY): 27 min  Charges:  $Therapeutic Activity: 23-37 mins                     Patrina Levering PT, DPT 07/02/21 10:42 AM [phone]

## 2021-07-02 NOTE — TOC Progression Note (Addendum)
Transition of Care [facility]) - Progression Note    Patient Details  Name: Earl Ramirez MRN: [id number] Date of Birth: Jun 07, 1941  Transition of Care [facility]) CM/SW [place], RN Phone Number: 07/02/2021, 9:35 AM  Clinical Narrative:   [facility] called and gave approval for [facility] STR 7 days [place] [id number], EMS approved auth [id number]  I called Debbie in admissions at [facility] and requested that he DC there today, Left a secure VM awaiting a responce       Expected Discharge Plan and Services                                                 Social Determinants of Health (SDOH) Interventions    Readmission Risk Interventions No flowsheet data found.

## 2021-07-02 NOTE — Progress Notes (Signed)
Blood pressure 120/73, pulse (!) 107, temperature (!) 96.9 F (36.1 C), resp. rate 17, height 5\' 7"  (1.702 m), weight 112.5 kg, SpO2 100 %. IV cath removed site c/d/I, medication received from the pharmacy and sent to facility with pt, pt went to [facility] via ems

## 2021-07-02 NOTE — TOC Progression Note (Signed)
Transition of Care [facility]) - Progression Note    Patient Details  Name: VIKRANT PRYCE MRN: [id number] Date of Birth: 1941/05/11  Transition of Care [facility]) CM/SW [place], RN Phone Number: 07/02/2021, 1:37 PM  Clinical Narrative:   Called EMS to transport to [place] [facility] room A8, Covid is negative, Bed side nurse called report, there are 2 ahead of him, The bedside nurse aware         Expected Discharge Plan and Services           Expected Discharge Date: 07/02/21                                     Social Determinants of Health (SDOH) Interventions    Readmission Risk Interventions No flowsheet data found.

## 2021-07-03 LAB — CULTURE, BLOOD (ROUTINE X 2)
Culture: NO GROWTH
Culture: NO GROWTH
Special Requests: ADEQUATE
Special Requests: ADEQUATE

## 2021-07-04 DIAGNOSIS — I739 Peripheral vascular disease, unspecified: Secondary | ICD-10-CM | POA: Diagnosis not present

## 2021-07-04 DIAGNOSIS — M6281 Muscle weakness (generalized): Secondary | ICD-10-CM | POA: Diagnosis not present

## 2021-07-04 DIAGNOSIS — L89890 Pressure ulcer of other site, unstageable: Secondary | ICD-10-CM | POA: Diagnosis not present

## 2021-07-05 ENCOUNTER — Inpatient Hospital Stay: Payer: PPO | Attending: Oncology

## 2021-07-05 ENCOUNTER — Inpatient Hospital Stay: Payer: PPO | Admitting: Pharmacist

## 2021-07-05 ENCOUNTER — Inpatient Hospital Stay: Payer: PPO

## 2021-07-06 ENCOUNTER — Telehealth: Payer: Self-pay | Admitting: Oncology

## 2021-07-06 NOTE — Telephone Encounter (Signed)
Attempted to call [facility], the facility Mr. Misener was discharged to from the hospital. My call wasdirected to their nursing leader and I had to leave a voicemail.  Asked in VM how long Mr. Cantera's stay at [facility] was expected to last (short-term stay vs. long term stay). Left me phone number and asked that they return my call.   Will f/u early next week if I do not hear back. When call is returned I will help to coordinate the rescheduling of his clinic appts/

## 2021-07-06 NOTE — Telephone Encounter (Signed)
Caregiver states that pt did not come to his appt on 12-1. They want to know if he needs to reschedule this appt. Phone # is [phone]

## 2021-07-08 DIAGNOSIS — G4733 Obstructive sleep apnea (adult) (pediatric): Secondary | ICD-10-CM | POA: Diagnosis not present

## 2021-07-08 DIAGNOSIS — I872 Venous insufficiency (chronic) (peripheral): Secondary | ICD-10-CM | POA: Diagnosis not present

## 2021-07-11 DIAGNOSIS — N3946 Mixed incontinence: Secondary | ICD-10-CM | POA: Diagnosis not present

## 2021-07-11 DIAGNOSIS — I739 Peripheral vascular disease, unspecified: Secondary | ICD-10-CM | POA: Diagnosis not present

## 2021-07-11 DIAGNOSIS — L89890 Pressure ulcer of other site, unstageable: Secondary | ICD-10-CM | POA: Diagnosis not present

## 2021-07-11 DIAGNOSIS — M6281 Muscle weakness (generalized): Secondary | ICD-10-CM | POA: Diagnosis not present

## 2021-07-11 NOTE — Telephone Encounter (Signed)
Oral Chemotherapy Pharmacist Encounter   Spoke with [facility], Earl Ramirez's placement is currently for short term care. They bring him to [facility] for his office appts.   Patient has also been receiving his Xtandi while at [facility] using home medication provided by the family.   Darl Pikes, PharmD, BCPS, BCOP, CPP Hematology/Oncology Clinical Pharmacist [place]/DB/AP [facility] [phone]  07/11/2021 11:24 AM

## 2021-07-13 NOTE — Telephone Encounter (Signed)
Unable to reach transportation scheduling at [place]. Left message a detailed message with the information about the date and time of his appt next week and his January appt.

## 2021-07-16 ENCOUNTER — Other Ambulatory Visit (HOSPITAL_COMMUNITY): Payer: Self-pay

## 2021-07-17 ENCOUNTER — Inpatient Hospital Stay: Payer: PPO

## 2021-07-17 ENCOUNTER — Telehealth: Payer: Self-pay | Admitting: Pharmacist

## 2021-07-17 ENCOUNTER — Inpatient Hospital Stay: Payer: PPO | Admitting: Pharmacist

## 2021-07-17 NOTE — Telephone Encounter (Signed)
Naomi from [facility] called to cancel Earl Ramirez appt for today. He told them he was tired and did not feel up to coming in today.  Jerene Pitch, can you cancel his appt to today and reschedule him for Thursday 07/19/21? Instead of moving his January appt, please cancel those and I will reschedule them following his next appt.  Please call [facility] at [phone], ask for Delcie Roch and leave her a message with the new appt date/time.  Thank you, -Laiylah Roettger

## 2021-07-18 DIAGNOSIS — N3946 Mixed incontinence: Secondary | ICD-10-CM | POA: Diagnosis not present

## 2021-07-18 DIAGNOSIS — M6281 Muscle weakness (generalized): Secondary | ICD-10-CM | POA: Diagnosis not present

## 2021-07-18 DIAGNOSIS — L89890 Pressure ulcer of other site, unstageable: Secondary | ICD-10-CM | POA: Diagnosis not present

## 2021-07-18 DIAGNOSIS — I739 Peripheral vascular disease, unspecified: Secondary | ICD-10-CM | POA: Diagnosis not present

## 2021-07-19 ENCOUNTER — Other Ambulatory Visit: Payer: Self-pay

## 2021-07-19 ENCOUNTER — Inpatient Hospital Stay: Payer: PPO

## 2021-07-19 ENCOUNTER — Emergency Department (HOSPITAL_COMMUNITY)
Admission: EM | Admit: 2021-07-19 | Discharge: 2021-07-19 | Disposition: A | Discharge status: Home / Self Care | Payer: PPO | Attending: Emergency Medicine | Admitting: Emergency Medicine

## 2021-07-19 ENCOUNTER — Inpatient Hospital Stay: Payer: PPO | Admitting: Pharmacist

## 2021-07-19 ENCOUNTER — Encounter (HOSPITAL_COMMUNITY): Payer: Self-pay

## 2021-07-19 DIAGNOSIS — Z87891 Personal history of nicotine dependence: Secondary | ICD-10-CM | POA: Insufficient documentation

## 2021-07-19 DIAGNOSIS — D539 Nutritional anemia, unspecified: Secondary | ICD-10-CM | POA: Diagnosis not present

## 2021-07-19 DIAGNOSIS — M7989 Other specified soft tissue disorders: Secondary | ICD-10-CM | POA: Insufficient documentation

## 2021-07-19 DIAGNOSIS — R609 Edema, unspecified: Secondary | ICD-10-CM

## 2021-07-19 DIAGNOSIS — R531 Weakness: Secondary | ICD-10-CM | POA: Diagnosis not present

## 2021-07-19 DIAGNOSIS — J449 Chronic obstructive pulmonary disease, unspecified: Secondary | ICD-10-CM | POA: Diagnosis not present

## 2021-07-19 DIAGNOSIS — Z79899 Other long term (current) drug therapy: Secondary | ICD-10-CM | POA: Insufficient documentation

## 2021-07-19 DIAGNOSIS — I10 Essential (primary) hypertension: Secondary | ICD-10-CM | POA: Diagnosis not present

## 2021-07-19 DIAGNOSIS — Z743 Need for continuous supervision: Secondary | ICD-10-CM | POA: Diagnosis not present

## 2021-07-19 DIAGNOSIS — Z7982 Long term (current) use of aspirin: Secondary | ICD-10-CM | POA: Insufficient documentation

## 2021-07-19 DIAGNOSIS — Z8546 Personal history of malignant neoplasm of prostate: Secondary | ICD-10-CM | POA: Diagnosis not present

## 2021-07-19 DIAGNOSIS — R600 Localized edema: Secondary | ICD-10-CM

## 2021-07-19 DIAGNOSIS — R279 Unspecified lack of coordination: Secondary | ICD-10-CM | POA: Diagnosis not present

## 2021-07-19 LAB — CBC WITH DIFFERENTIAL/PLATELET
Abs Immature Granulocytes: 0.02 10*3/uL (ref 0.00–0.07)
Basophils Absolute: 0 10*3/uL (ref 0.0–0.1)
Basophils Relative: 1 %
Eosinophils Absolute: 0 10*3/uL (ref 0.0–0.5)
Eosinophils Relative: 1 %
HCT: 37.8 % — ABNORMAL LOW (ref 39.0–52.0)
Hemoglobin: 12.6 g/dL — ABNORMAL LOW (ref 13.0–17.0)
Immature Granulocytes: 1 %
Lymphocytes Relative: 13 %
Lymphs Abs: 0.5 10*3/uL — ABNORMAL LOW (ref 0.7–4.0)
MCH: 36.5 pg — ABNORMAL HIGH (ref 26.0–34.0)
MCHC: 33.3 g/dL (ref 30.0–36.0)
MCV: 109.6 fL — ABNORMAL HIGH (ref 80.0–100.0)
Monocytes Absolute: 0.5 10*3/uL (ref 0.1–1.0)
Monocytes Relative: 11 %
Neutro Abs: 3 10*3/uL (ref 1.7–7.7)
Neutrophils Relative %: 73 %
Platelets: 42 10*3/uL — ABNORMAL LOW (ref 150–400)
RBC: 3.45 MIL/uL — ABNORMAL LOW (ref 4.22–5.81)
RDW: 15.3 % (ref 11.5–15.5)
WBC: 4.1 10*3/uL (ref 4.0–10.5)
nRBC: 0 % (ref 0.0–0.2)

## 2021-07-19 LAB — BRAIN NATRIURETIC PEPTIDE: B Natriuretic Peptide: 252 pg/mL — ABNORMAL HIGH (ref 0.0–100.0)

## 2021-07-19 LAB — BASIC METABOLIC PANEL
Anion gap: 10 (ref 5–15)
BUN: 17 mg/dL (ref 8–23)
CO2: 25 mmol/L (ref 22–32)
Calcium: 7.1 mg/dL — ABNORMAL LOW (ref 8.9–10.3)
Chloride: 97 mmol/L — ABNORMAL LOW (ref 98–111)
Creatinine, Ser: 0.55 mg/dL — ABNORMAL LOW (ref 0.61–1.24)
GFR, Estimated: >60 mL/min (ref >60)
Glucose, Bld: 103 mg/dL — ABNORMAL HIGH (ref 70–99)
Potassium: 3.4 mmol/L — ABNORMAL LOW (ref 3.5–5.1)
Sodium: 132 mmol/L — ABNORMAL LOW (ref 135–145)

## 2021-07-19 NOTE — ED Triage Notes (Signed)
Patient sent from [facility] due to lower extremity edema and were unable to palpate pedal pulse. EMS advises bilateral pedal pulse present but weak.

## 2021-07-19 NOTE — ED Provider Notes (Signed)
[facility] EMERGENCY DEPARTMENT Provider Note   CSN: [id number] Arrival date & time: 07/19/21  1222     History Chief Complaint  Patient presents with   Leg Swelling    Earl Ramirez is a 80 y.o. male.  With a past medical history of chronic peripheral edema, atrial fibrillation, COPD, recurrent cellulitis who was sent to the emergency department by [facility] facility where he was currently residing for rehab after recent hospitalization.  He was sent because he has had increased swelling in his legs and they were unable to palpate pulses.  Patient states that they stopped applying compression because they thought that his legs look "too peripheral."  Patient states that his legs of the same color that they always are.  He denies any increased pain but states that are significantly swollen.  HPI     Past Medical History:  Diagnosis Date   Aortic valve replaced    1998   Arthritis    Atrial fibrillation ([place])    COPD (chronic obstructive pulmonary disease) (HCC)    Dysrhythmia    atrial fib   Hernia of abdominal wall    Hypertension    Indigestion    Neuropathy    Neuropathy    Prostate cancer ([place])    Sleep apnea    Thrombocytopenia (HCC)    Tremors of nervous system    Urinary problem     Patient Active Problem List   Diagnosis Date Noted   Cellulitis 06/28/2021   Tremors of nervous system    Physical deconditioning    Acute cystitis with hematuria    Osteoarthritis of lumbar spine    Lower extremity weakness 06/18/2021   Thyroid nodule 06/18/2021   A-fib ([place]) 06/18/2021   Primary hypertension 06/18/2021   Liver cirrhosis ([place]) 06/18/2021   Weakness 06/18/2021   Metastatic malignant neoplasm ([place])    Portal hypertension ([place])    Vitamin D deficiency 03/27/2020   Chronic venous insufficiency 03/23/2020   PAD (peripheral artery disease) ([place]) 03/23/2020   Lymphedema 03/23/2020   COPD (chronic obstructive pulmonary disease) ([place]) 12/17/2019    Idiopathic peripheral neuropathy 12/17/2019   Sleep apnea 12/17/2019   Splenomegaly 12/17/2019   Aortic valve disease 06/08/2018   Bilateral leg edema 03/19/2017   Bilateral chronic knee pain 02/12/2016   Bilateral leg pain 02/12/2016   Incomplete emptying of bladder 04/07/2015   Chronic atrial flutter ([place]) 01/30/2015   SOB (shortness of breath) 12/07/2014   Thrombocytopenia ([place]) 12/02/2014   Prostate cancer ([place]) 11/22/2014   Pyuria 10/06/2014   Gross hematuria 10/04/2014   Urinary retention 10/04/2014    Past Surgical History:  Procedure Laterality Date   CALDWELL LUC     tongue      growth removed   TRANSURETHRAL RESECTION OF PROSTATE N/A 01/18/2019   Procedure: CHANNEL TRANSURETHRAL RESECTION OF THE PROSTATE (TURP);  Surgeon: [name], MD;  Location: [facility] ORS;  Service: Urology;  Laterality: N/A;   VALVE REPLACEMENT         Family History  Problem Relation Age of Onset   Heart attack Father    Alcohol abuse Father    Congestive Heart Failure Mother    Alzheimer's disease Sister     Social History   Tobacco Use   Smoking status: Former    Types: Cigarettes    Quit date: 01/13/2001    Years since quitting: 20.5   Smokeless tobacco: Former    Quit date: 09/24/2000  Vaping Use  Vaping Use: Never used  Substance Use Topics   Alcohol use: Yes    Alcohol/week: 10.0 standard drinks    Types: 10 Cans of beer per week   Drug use: No    Home Medications Prior to Admission medications   Medication Sig Start Date End Date Taking? Authorizing Provider  ALPRAZolam (XANAX) 0.25 MG tablet Take 1 tablet (0.25 mg total) by mouth daily as needed for up to 4 doses for anxiety. 07/02/21   Antonieta Pert, MD  aspirin EC 81 MG tablet Take 81 mg by mouth daily.     [provider]  calcium carbonate (TUMS - DOSED IN MG ELEMENTAL CALCIUM) 500 MG chewable tablet Chew 1 tablet by mouth daily.     [provider]  docusate sodium (COLACE) 100 MG capsule  Take 100 mg by mouth daily as needed for mild constipation.    [provider]  enzalutamide Gillermina Phy) 40 MG tablet Take 4 tablets (160 mg total) by mouth daily. 04/30/21   Lloyd Huger, MD  furosemide (LASIX) 20 MG tablet Take 20 mg by mouth daily. 05/16/20   [provider]  gabapentin (NEURONTIN) 300 MG capsule Take 900 mg by mouth 4 (four) times daily.  09/22/19   [provider]  metoprolol succinate (TOPROL-XL) 25 MG 24 hr tablet Take 25 mg by mouth daily.  05/10/19   [provider]  mirabegron ER (MYRBETRIQ) 50 MG TB24 tablet Take 1 tablet (50 mg total) by mouth daily. 04/26/21   [name], MD  tamsulosin [facility]) 0.4 MG CAPS capsule Take 1 capsule by mouth twice daily 04/16/21   [name], MD    Allergies    Oxycodone-acetaminophen, Oxycodone-acetaminophen, Amoxicillin, and Penicillins  Review of Systems   Review of Systems Ten systems reviewed and are negative for acute change, except as noted in the HPI.   Physical Exam Updated Vital Signs BP (!) 122/58    Pulse 67    Temp 97.6 F (36.4 C)    Resp 18    Ht 5\' 7"  (1.702 m)    Wt 112.5 kg    SpO2 99%    BMI 38.84 kg/m   Physical Exam Vitals and nursing note reviewed.  Constitutional:      General: He is not in acute distress.    Appearance: He is well-developed. He is not diaphoretic.  HENT:     Head: Normocephalic and atraumatic.  Eyes:     General: No scleral icterus.    Conjunctiva/sclera: Conjunctivae normal.  Cardiovascular:     Rate and Rhythm: Normal rate and regular rhythm.     Pulses:          Dorsalis pedis pulses are detected w/ Doppler on the right side and detected w/ Doppler on the left side.       Posterior tibial pulses are detected w/ Doppler on the right side and detected w/ Doppler on the left side.     Heart sounds: Normal heart sounds.     Comments: Bilateral peripheral edema with scaling skin, chronic woody changes consistent with chronic peripheral  edema.  There is a small stage I ulceration which is well-healing on the right lower extremity.  Dressing is applied. Pulmonary:     Effort: Pulmonary effort is normal. No respiratory distress.     Breath sounds: Normal breath sounds.  Abdominal:     Palpations: Abdomen is soft.     Tenderness: There is no abdominal tenderness.  Musculoskeletal:  Cervical back: Normal range of motion and neck supple.  Skin:    General: Skin is warm and dry.  Neurological:     Mental Status: He is alert.  Psychiatric:        Behavior: Behavior normal.    ED Results / Procedures / Treatments   Labs (all labs ordered are listed, but only abnormal results are displayed) Labs Reviewed - No data to display  EKG None  Radiology No results found.  Procedures Procedures   Medications Ordered in ED Medications - No data to display  ED Course  I have reviewed the triage vital signs and the nursing notes.  Pertinent labs & imaging results that were available during my care of the patient were reviewed by me and considered in my medical decision making (see chart for details).    MDM Rules/Calculators/A&P                          Patient was sent into the emergency department because the staff at his skilled nursing facility were unable to palpate pulses in his very swollen legs. His pulses are present by Doppler. I feel that his pulses were not palpable because he has not had any compression on his legs leading to increased swelling and inability to palpate pulses.   I ordered and reviewed labs including a CBC which shows stable macrocytic anemia, BMP and BMP.  Patient is hypocalcemic.  This appears to be chronic but downward trending.  Patient is advised to begin taking calcium daily and follow-up with his PCP for further evaluation to rule out hyperparathyroidism.  Patient appears otherwise appropriate for discharge at this time back to his skilled nursing facility. Final Clinical Impression(s)  / ED Diagnoses Final diagnoses:  None    Rx / DC Orders ED Discharge Orders     None        Margarita Mail, PA-C 07/19/21 1603    Milton Ferguson, MD 07/24/21 1210

## 2021-07-19 NOTE — ED Notes (Signed)
Bilateral pedal pulse dopplered and marked with skin pen

## 2021-07-19 NOTE — Discharge Instructions (Addendum)
Your calcium level is low.  You need to begin taking a calcium supplement if you are not taking 1.  This can also be due to an endocrine disorder and needs to be followed up by your primary care physician.  Your facility needs to continue to compress your legs.  You have excellent pulse flow in your feet which we were able to hear with the Doppler.  It is likely that they would be able to fill your pulses if we have less swelling in your legs.  Please follow closely with your primary care physician for these issues.   Get help right away if: You have chest pain. You have persistent rapid or irregular heartbeats. You have trouble breathing. You faint. You start to have seizures. You have confusion.

## 2021-07-20 ENCOUNTER — Other Ambulatory Visit (HOSPITAL_COMMUNITY): Payer: Self-pay

## 2021-07-23 ENCOUNTER — Other Ambulatory Visit (INDEPENDENT_AMBULATORY_CARE_PROVIDER_SITE_OTHER): Payer: Self-pay | Admitting: Vascular Surgery

## 2021-07-23 ENCOUNTER — Telehealth: Payer: Self-pay

## 2021-07-23 DIAGNOSIS — K7460 Unspecified cirrhosis of liver: Secondary | ICD-10-CM | POA: Diagnosis not present

## 2021-07-23 DIAGNOSIS — I4891 Unspecified atrial fibrillation: Secondary | ICD-10-CM | POA: Diagnosis not present

## 2021-07-23 DIAGNOSIS — I890 Lymphedema, not elsewhere classified: Secondary | ICD-10-CM | POA: Diagnosis not present

## 2021-07-23 DIAGNOSIS — D696 Thrombocytopenia, unspecified: Secondary | ICD-10-CM | POA: Diagnosis not present

## 2021-07-23 DIAGNOSIS — F419 Anxiety disorder, unspecified: Secondary | ICD-10-CM | POA: Diagnosis not present

## 2021-07-23 DIAGNOSIS — Z9181 History of falling: Secondary | ICD-10-CM | POA: Diagnosis not present

## 2021-07-23 DIAGNOSIS — E559 Vitamin D deficiency, unspecified: Secondary | ICD-10-CM | POA: Diagnosis not present

## 2021-07-23 DIAGNOSIS — N3946 Mixed incontinence: Secondary | ICD-10-CM | POA: Diagnosis not present

## 2021-07-23 DIAGNOSIS — I739 Peripheral vascular disease, unspecified: Secondary | ICD-10-CM

## 2021-07-23 DIAGNOSIS — L89896 Pressure-induced deep tissue damage of other site: Secondary | ICD-10-CM | POA: Diagnosis not present

## 2021-07-23 DIAGNOSIS — E669 Obesity, unspecified: Secondary | ICD-10-CM | POA: Diagnosis not present

## 2021-07-23 DIAGNOSIS — Z6837 Body mass index (BMI) 37.0-37.9, adult: Secondary | ICD-10-CM | POA: Diagnosis not present

## 2021-07-23 DIAGNOSIS — K219 Gastro-esophageal reflux disease without esophagitis: Secondary | ICD-10-CM | POA: Diagnosis not present

## 2021-07-23 DIAGNOSIS — I10 Essential (primary) hypertension: Secondary | ICD-10-CM | POA: Diagnosis not present

## 2021-07-23 DIAGNOSIS — E079 Disorder of thyroid, unspecified: Secondary | ICD-10-CM | POA: Diagnosis not present

## 2021-07-23 DIAGNOSIS — C61 Malignant neoplasm of prostate: Secondary | ICD-10-CM | POA: Diagnosis not present

## 2021-07-23 DIAGNOSIS — L03115 Cellulitis of right lower limb: Secondary | ICD-10-CM | POA: Diagnosis not present

## 2021-07-23 DIAGNOSIS — L03116 Cellulitis of left lower limb: Secondary | ICD-10-CM | POA: Diagnosis not present

## 2021-07-23 DIAGNOSIS — F32A Depression, unspecified: Secondary | ICD-10-CM | POA: Diagnosis not present

## 2021-07-23 DIAGNOSIS — J449 Chronic obstructive pulmonary disease, unspecified: Secondary | ICD-10-CM | POA: Diagnosis not present

## 2021-07-23 DIAGNOSIS — G8929 Other chronic pain: Secondary | ICD-10-CM | POA: Diagnosis not present

## 2021-07-23 DIAGNOSIS — Z7982 Long term (current) use of aspirin: Secondary | ICD-10-CM | POA: Diagnosis not present

## 2021-07-23 DIAGNOSIS — G629 Polyneuropathy, unspecified: Secondary | ICD-10-CM | POA: Diagnosis not present

## 2021-07-23 NOTE — Telephone Encounter (Signed)
Spoke with patient and scheduled an in-person Palliative Consult for 08/02/21 @ 12:30PM.  COVID screening was negative. No pets in home. Patient lives alone, but has a person stay with him a couple days a week.   Consent obtained; updated Outlook/Netsmart/Team List and Epic.   Patient is aware he may be receiving a call from provider the day before or day of to confirm appointment.

## 2021-07-24 ENCOUNTER — Ambulatory Visit (INDEPENDENT_AMBULATORY_CARE_PROVIDER_SITE_OTHER): Payer: PPO | Admitting: Nurse Practitioner

## 2021-07-24 ENCOUNTER — Encounter (INDEPENDENT_AMBULATORY_CARE_PROVIDER_SITE_OTHER): Payer: PPO

## 2021-07-24 DIAGNOSIS — I739 Peripheral vascular disease, unspecified: Secondary | ICD-10-CM | POA: Diagnosis not present

## 2021-07-24 DIAGNOSIS — D696 Thrombocytopenia, unspecified: Secondary | ICD-10-CM | POA: Diagnosis not present

## 2021-07-24 DIAGNOSIS — F32A Depression, unspecified: Secondary | ICD-10-CM | POA: Diagnosis not present

## 2021-07-24 DIAGNOSIS — N3946 Mixed incontinence: Secondary | ICD-10-CM | POA: Diagnosis not present

## 2021-07-24 DIAGNOSIS — K7460 Unspecified cirrhosis of liver: Secondary | ICD-10-CM | POA: Diagnosis not present

## 2021-07-24 DIAGNOSIS — C61 Malignant neoplasm of prostate: Secondary | ICD-10-CM | POA: Diagnosis not present

## 2021-07-24 DIAGNOSIS — F419 Anxiety disorder, unspecified: Secondary | ICD-10-CM | POA: Diagnosis not present

## 2021-07-24 DIAGNOSIS — I4891 Unspecified atrial fibrillation: Secondary | ICD-10-CM | POA: Diagnosis not present

## 2021-07-24 DIAGNOSIS — G629 Polyneuropathy, unspecified: Secondary | ICD-10-CM | POA: Diagnosis not present

## 2021-07-24 DIAGNOSIS — Z9181 History of falling: Secondary | ICD-10-CM | POA: Diagnosis not present

## 2021-07-24 DIAGNOSIS — E079 Disorder of thyroid, unspecified: Secondary | ICD-10-CM | POA: Diagnosis not present

## 2021-07-24 DIAGNOSIS — I10 Essential (primary) hypertension: Secondary | ICD-10-CM | POA: Diagnosis not present

## 2021-07-24 DIAGNOSIS — K219 Gastro-esophageal reflux disease without esophagitis: Secondary | ICD-10-CM | POA: Diagnosis not present

## 2021-07-24 DIAGNOSIS — L03116 Cellulitis of left lower limb: Secondary | ICD-10-CM | POA: Diagnosis not present

## 2021-07-24 DIAGNOSIS — Z7982 Long term (current) use of aspirin: Secondary | ICD-10-CM | POA: Diagnosis not present

## 2021-07-24 DIAGNOSIS — E559 Vitamin D deficiency, unspecified: Secondary | ICD-10-CM | POA: Diagnosis not present

## 2021-07-24 DIAGNOSIS — J449 Chronic obstructive pulmonary disease, unspecified: Secondary | ICD-10-CM | POA: Diagnosis not present

## 2021-07-24 DIAGNOSIS — I890 Lymphedema, not elsewhere classified: Secondary | ICD-10-CM | POA: Diagnosis not present

## 2021-07-24 DIAGNOSIS — G8929 Other chronic pain: Secondary | ICD-10-CM | POA: Diagnosis not present

## 2021-07-24 DIAGNOSIS — L03115 Cellulitis of right lower limb: Secondary | ICD-10-CM | POA: Diagnosis not present

## 2021-07-24 DIAGNOSIS — E669 Obesity, unspecified: Secondary | ICD-10-CM | POA: Diagnosis not present

## 2021-07-24 DIAGNOSIS — Z6837 Body mass index (BMI) 37.0-37.9, adult: Secondary | ICD-10-CM | POA: Diagnosis not present

## 2021-07-24 DIAGNOSIS — L89896 Pressure-induced deep tissue damage of other site: Secondary | ICD-10-CM | POA: Diagnosis not present

## 2021-07-25 DIAGNOSIS — R600 Localized edema: Secondary | ICD-10-CM | POA: Diagnosis not present

## 2021-08-01 DIAGNOSIS — E559 Vitamin D deficiency, unspecified: Secondary | ICD-10-CM | POA: Diagnosis not present

## 2021-08-01 DIAGNOSIS — N3946 Mixed incontinence: Secondary | ICD-10-CM | POA: Diagnosis not present

## 2021-08-01 DIAGNOSIS — L89896 Pressure-induced deep tissue damage of other site: Secondary | ICD-10-CM | POA: Diagnosis not present

## 2021-08-01 DIAGNOSIS — E079 Disorder of thyroid, unspecified: Secondary | ICD-10-CM | POA: Diagnosis not present

## 2021-08-01 DIAGNOSIS — E669 Obesity, unspecified: Secondary | ICD-10-CM | POA: Diagnosis not present

## 2021-08-01 DIAGNOSIS — Z6837 Body mass index (BMI) 37.0-37.9, adult: Secondary | ICD-10-CM | POA: Diagnosis not present

## 2021-08-01 DIAGNOSIS — G8929 Other chronic pain: Secondary | ICD-10-CM | POA: Diagnosis not present

## 2021-08-01 DIAGNOSIS — F419 Anxiety disorder, unspecified: Secondary | ICD-10-CM | POA: Diagnosis not present

## 2021-08-01 DIAGNOSIS — C61 Malignant neoplasm of prostate: Secondary | ICD-10-CM | POA: Diagnosis not present

## 2021-08-01 DIAGNOSIS — L03115 Cellulitis of right lower limb: Secondary | ICD-10-CM | POA: Diagnosis not present

## 2021-08-01 DIAGNOSIS — D696 Thrombocytopenia, unspecified: Secondary | ICD-10-CM | POA: Diagnosis not present

## 2021-08-01 DIAGNOSIS — K219 Gastro-esophageal reflux disease without esophagitis: Secondary | ICD-10-CM | POA: Diagnosis not present

## 2021-08-01 DIAGNOSIS — F32A Depression, unspecified: Secondary | ICD-10-CM | POA: Diagnosis not present

## 2021-08-01 DIAGNOSIS — G629 Polyneuropathy, unspecified: Secondary | ICD-10-CM | POA: Diagnosis not present

## 2021-08-01 DIAGNOSIS — K7460 Unspecified cirrhosis of liver: Secondary | ICD-10-CM | POA: Diagnosis not present

## 2021-08-01 DIAGNOSIS — Z9181 History of falling: Secondary | ICD-10-CM | POA: Diagnosis not present

## 2021-08-01 DIAGNOSIS — I4891 Unspecified atrial fibrillation: Secondary | ICD-10-CM | POA: Diagnosis not present

## 2021-08-01 DIAGNOSIS — J449 Chronic obstructive pulmonary disease, unspecified: Secondary | ICD-10-CM | POA: Diagnosis not present

## 2021-08-01 DIAGNOSIS — I739 Peripheral vascular disease, unspecified: Secondary | ICD-10-CM | POA: Diagnosis not present

## 2021-08-01 DIAGNOSIS — I890 Lymphedema, not elsewhere classified: Secondary | ICD-10-CM | POA: Diagnosis not present

## 2021-08-01 DIAGNOSIS — I10 Essential (primary) hypertension: Secondary | ICD-10-CM | POA: Diagnosis not present

## 2021-08-01 DIAGNOSIS — L03116 Cellulitis of left lower limb: Secondary | ICD-10-CM | POA: Diagnosis not present

## 2021-08-01 DIAGNOSIS — Z7982 Long term (current) use of aspirin: Secondary | ICD-10-CM | POA: Diagnosis not present

## 2021-08-02 ENCOUNTER — Other Ambulatory Visit: Payer: Self-pay

## 2021-08-02 ENCOUNTER — Other Ambulatory Visit: Payer: PPO | Admitting: Primary Care

## 2021-08-02 ENCOUNTER — Encounter: Payer: Self-pay | Admitting: Primary Care

## 2021-08-02 VITALS — Ht 67.0 in | Wt 235.0 lb

## 2021-08-02 DIAGNOSIS — Z515 Encounter for palliative care: Secondary | ICD-10-CM | POA: Diagnosis not present

## 2021-08-02 DIAGNOSIS — R0602 Shortness of breath: Secondary | ICD-10-CM | POA: Diagnosis not present

## 2021-08-02 DIAGNOSIS — R531 Weakness: Secondary | ICD-10-CM

## 2021-08-02 DIAGNOSIS — R5381 Other malaise: Secondary | ICD-10-CM | POA: Diagnosis not present

## 2021-08-02 DIAGNOSIS — I739 Peripheral vascular disease, unspecified: Secondary | ICD-10-CM

## 2021-08-02 DIAGNOSIS — K7030 Alcoholic cirrhosis of liver without ascites: Secondary | ICD-10-CM

## 2021-08-02 DIAGNOSIS — R600 Localized edema: Secondary | ICD-10-CM | POA: Diagnosis not present

## 2021-08-02 NOTE — Progress Notes (Signed)
[facility] Palliative Care Consult Note Telephone: [phone]  Fax: [phone]   Date of encounter: 08/02/21 1:18 PM PATIENT NAME: Earl Ramirez [address] [place] [address]   [phone] (home)  DOB: 03-15-1941 MRN: [id number] PRIMARY CARE PROVIDER:    Baxter Hire, MD,  [place] [address] [phone]  REFERRING PROVIDER:   Lloyd Ramirez, [place],  [place] [address] [phone]   RESPONSIBLE PARTY:    Contact Information     Name Relation Home Work Mobile   EarlRamirez Daughter [phone]  [phone]       Neighbor Earl Ramirez helps out.  I met face to face with patient at home. I met with patient to assess needs for DME. Palliative Care was asked to follow this patient by consultation request of  Earl Ramirez, Earl November, MD  to address advance care planning and complex medical decision making. This is the initial visit.                                     ASSESSMENT AND PLAN / RECOMMENDATIONS:   Advance Care Planning/Goals of Care: Goals include to maximize quality of life and symptom management. Patient/health care surrogate gave his/her permission to discuss.Our advance care planning conversation included a discussion about:    The value and importance of advance care planning  Experiences with loved ones who have been seriously ill or have died  Exploration of personal, cultural or spiritual beliefs that might influence medical decisions  Exploration of goals of care in the event of a sudden injury or illness  Identification of a healthcare agent : Has brothers in [place], [place] and  a sister Environmental education officer. Review and  creation of an advance directive document.  Decision not to resuscitate  due to poor prognosis.  CODE STATUS:  DNR  I completed a MOST form today. The patient  outlined his wishes for the following treatment decisions:  Cardiopulmonary  Resuscitation: Do Not Attempt Resuscitation (DNR/No CPR)  Medical Interventions: Comfort Measures: Keep clean, warm, and dry. Use medication by any route, positioning, wound care, and other measures to relieve pain and suffering. Use oxygen, suction and manual treatment of airway obstruction as needed for comfort. Do not transfer to the hospital unless comfort needs cannot be met in current location.  Antibiotics: Antibiotics if indicated  IV Fluids: IV fluids for a defined trial period  Feeding Tube: No feeding tube   I spent 30 minutes providing this consultation. More than 50% of the time in this consultation was spent in counseling and care coordination. ------------------------------------------------------------------------------------------------------------------------------------------------------------------------------------------------------------------- Symptom Management/Plan:  Order for wheel chair, self propelling with removable leg rests: Ht 67 ", 235 lbs   LE cellulitis and lymphedema: Recent edema and cellulitis bout sent  him to hospital and SNF.  Expired skilled days and is back home, and hiring some help.  Has lymphedema pumps but has not been using but will start. Home health is doing wraps to wounds on legs. Need to elevate feet and is a fall risk when mobilizing. Reports fear of falling ambulating < 25 feet and cannot stand more than 1 minute.  ADLs and mobility:  Difficulty fixing meals due to fatigue and fall risk. Needs wheel chair for mobility in the home. Has someone coming in for some help with adls and iadls. I will order w/c for  him to have safe in home self propelling mobility, and leg rests to elevate LE.  Nutrition: Having problems with meal prep, discussed meals on wheels. Offered [employer] and explained the concept.  Will think about it. Albumin in decline at 2.8.  Prostate Cancer: States he is on protocol of xtandi.  Endorses he does not feel he will  be here any more. Discussed difficulty in getting to cancer center due to mobility. May have to consider stopping treatment due to lack of ability to egress home.   Liver disease: Most liver enzymes elevated,  T bilirubin of 4.4 in Ramirez 22. Appears jaundiced today. Endorses drowsiness but LOC is WNL.  Pain:  Reports now is in bones, in ankles, reports low level of pain. Denies having pain medicine. Taking gabapentin 900 mg  qid. Discussed narcotic use for increasing bone pain. I would likely start morphine due to oxycodone allergy.  Follow up Palliative Care Visit: Palliative care will continue to follow for complex medical decision making, advance care planning, and clarification of goals. Return 4 weeks or prn.  This visit was coded based on medical decision making (MDM).  PPS: 40%  HOSPICE ELIGIBILITY/DIAGNOSIS: yes with concordant goals of care  Chief Complaint: debility, immobility, LE pain  HISTORY OF PRESENT ILLNESS:  Earl Ramirez is a 80 y.o. year old male  with h/o metastatic prostate cancer, COPD, PAD, obesity. Home from hospitalization and SNF stay with increased debility and inability for self care. Wife died several years ago and he has min. Family support. Patient endorses fatigue, increased anorexia, weakness and LE edema with leg wounds. The wounds are Rx by home health. Immobility is worsening in context of advancing disease and making him unable to exit home for care, and increasing his needs with IADSl and adl assistance. Marland Kitchen   History obtained from review of EMR, discussion with primary team, and interview with family, facility staff/caregiver and/or [name]  I reviewed available labs, medications, imaging, studies and related documents from the EMR.  Records reviewed and summarized above.   ROS  General: NAD EYES: denies vision changes, uses glasses ENMT: denies dysphagia Cardiovascular: denies chest pain, endorses  DOE Pulmonary: denies cough, denies increased  SOB Abdomen: endorses fair  appetite,  endorses constipation, endorses continence of bowel GU: denies dysuria, endorses continence of urine MSK:  endorses weakness,  no falls reported Skin:  cellulitis and wounds bil LE, leg wraps in place bil Neurological: endorses LE  pain, denies insomnia Psych: Endorses positive mood Heme/lymph/immuno: denies bruises, abnormal bleeding  Physical Exam: Current and past weights: 235 lb reported, Body mass index is 36.81 kg/m. Constitutional: NAD General: frail appearing, obese  EYES: icteric sclera, lids intact, no discharge  ENMT: intact hearing, oral mucous membranes moist, dentition intact CV: S1S2, RRR, no LE edema Pulmonary: LCTA, no increased work of breathing, no cough, room air Abdomen: intake 75%, normo-active BS + 4 quadrants, soft and non tender, no ascites GU: deferred MSK: + sarcopenia, moves all extremities, ambulatory with walker, fall risk Skin: warm and dry, no rashes or wounds on visible skin Neuro:  + generalized weakness,  no cognitive impairment Psych: non-anxious affect, A and O x 3 Hem/lymph/immuno: no widespread bruising CURRENT PROBLEM LIST:  Patient Active Problem List   Diagnosis Date Noted   Cellulitis 06/28/2021   Tremors of nervous system    Physical deconditioning    Acute cystitis with hematuria    Osteoarthritis of lumbar spine    Lower extremity  weakness 06/18/2021   Thyroid nodule 06/18/2021   A-fib ([place]) 06/18/2021   Primary hypertension 06/18/2021   Liver cirrhosis ([place]) 06/18/2021   Weakness 06/18/2021   Metastatic malignant neoplasm (HCC)    Portal hypertension (HCC)    Vitamin D deficiency 03/27/2020   Chronic venous insufficiency 03/23/2020   PAD (peripheral artery disease) ([place]) 03/23/2020   Lymphedema 03/23/2020   COPD (chronic obstructive pulmonary disease) ([place]) 12/17/2019   Idiopathic peripheral neuropathy 12/17/2019   Sleep apnea 12/17/2019   Splenomegaly 12/17/2019   Aortic valve  disease 06/08/2018   Bilateral leg edema 03/19/2017   Bilateral chronic knee pain 02/12/2016   Bilateral leg pain 02/12/2016   Incomplete emptying of bladder 04/07/2015   Chronic atrial flutter (HCC) 01/30/2015   SOB (shortness of breath) 12/07/2014   Thrombocytopenia ([place]) 12/02/2014   Prostate cancer ([place]) 11/22/2014   Pyuria 10/06/2014   Gross hematuria 10/04/2014   Urinary retention 10/04/2014   PAST MEDICAL HISTORY:  Active Ambulatory Problems    Diagnosis Date Noted   Thrombocytopenia ([place]) 12/02/2014   Prostate cancer ([place]) 11/22/2014   Aortic valve disease 06/08/2018   Bilateral chronic knee pain 02/12/2016   Bilateral leg edema 03/19/2017   Bilateral leg pain 02/12/2016   Chronic atrial flutter ([place]) 01/30/2015   COPD (chronic obstructive pulmonary disease) ([place]) 12/17/2019   Gross hematuria 10/04/2014   Idiopathic peripheral neuropathy 12/17/2019   Incomplete emptying of bladder 04/07/2015   Pyuria 10/06/2014   Sleep apnea 12/17/2019   SOB (shortness of breath) 12/07/2014   Splenomegaly 12/17/2019   Urinary retention 10/04/2014   Chronic venous insufficiency 03/23/2020   PAD (peripheral artery disease) ([place]) 03/23/2020   Lymphedema 03/23/2020   Vitamin D deficiency 03/27/2020   Lower extremity weakness 06/18/2021   Thyroid nodule 06/18/2021   A-fib ([place]) 06/18/2021   Primary hypertension 06/18/2021   Liver cirrhosis ([place]) 06/18/2021   Weakness 06/18/2021   Metastatic malignant neoplasm (HCC)    Portal hypertension (HCC)    Acute cystitis with hematuria    Osteoarthritis of lumbar spine    Cellulitis 06/28/2021   Tremors of nervous system    Physical deconditioning    Resolved Ambulatory Problems    Diagnosis Date Noted   No Resolved Ambulatory Problems   Past Medical History:  Diagnosis Date   Aortic valve replaced    Arthritis    Atrial fibrillation (HCC)    Dysrhythmia    Hernia of abdominal wall    Hypertension    Indigestion    Neuropathy     Neuropathy    Urinary problem    SOCIAL HX:  Social History   Tobacco Use   Smoking status: Former    Types: Cigarettes    Quit date: 01/13/2001    Years since quitting: 20.5   Smokeless tobacco: Former    Quit date: 09/24/2000  Substance Use Topics   Alcohol use: Yes    Alcohol/week: 10.0 standard drinks    Types: 10 Cans of beer per week   FAMILY HX:  Family History  Problem Relation Age of Onset   Heart attack Father    Alcohol abuse Father    Congestive Heart Failure Mother    Alzheimer's disease Sister       ALLERGIES:  Allergies  Allergen Reactions   Oxycodone-Acetaminophen Nausea Only and Shortness Of Breath   Oxycodone-Acetaminophen     Other reaction(s): Drowsy   Amoxicillin Itching    Hand itching   Penicillins Rash    Did it involve  swelling of the face/tongue/throat, SOB, or low BP? No Did it involve sudden or severe rash/hives, skin peeling, or any reaction on the inside of your mouth or nose? No Did you need to seek medical attention at a hospital or doctor's office? No When did it last happen?      4-5 years ago If all above answers are NO, may proceed with cephalosporin use.      PERTINENT MEDICATIONS:  Outpatient Encounter Medications as of 08/02/2021  Medication Sig   ALPRAZolam (XANAX) 0.25 MG tablet Take 1 tablet (0.25 mg total) by mouth daily as needed for up to 4 doses for anxiety. (Patient taking differently: Take 0.25 mg by mouth every 6 (six) hours as needed for anxiety.)   aspirin EC 81 MG tablet Take 81 mg by mouth daily.    calcium carbonate (TUMS - DOSED IN MG ELEMENTAL CALCIUM) 500 MG chewable tablet Chew 1 tablet by mouth daily.    docusate sodium (COLACE) 100 MG capsule Take 100 mg by mouth daily as needed for mild constipation.   enzalutamide (XTANDI) 40 MG tablet Take 4 tablets (160 mg total) by mouth daily.   furosemide (LASIX) 20 MG tablet Take 20 mg by mouth daily.   gabapentin (NEURONTIN) 300 MG capsule Take 900 mg by mouth  4 (four) times daily.    metoprolol succinate (TOPROL-XL) 25 MG 24 hr tablet Take 25 mg by mouth daily.    mirabegron ER (MYRBETRIQ) 50 MG TB24 tablet Take 1 tablet (50 mg total) by mouth daily. (Patient not taking: Reported on 07/19/2021)   Multiple Vitamins-Minerals (MULTIVITAMIN WITH MINERALS) tablet Take 1 tablet by mouth daily.   oxybutynin (DITROPAN-XL) 10 MG 24 hr tablet Take 10 mg by mouth daily.   tamsulosin (FLOMAX) 0.4 MG CAPS capsule Take 1 capsule by mouth twice daily (Patient taking differently: Take 0.4 mg by mouth daily.)   traMADol (ULTRAM) 50 MG tablet Take 50 mg by mouth every 6 (six) hours as needed for moderate pain.   No facility-administered encounter medications on file as of 08/02/2021.   Thank you for the opportunity to participate in the care of Mr. Jarboe.  The palliative care team will continue to follow. Please call our office at [phone] if we can be of additional assistance.   Jason Coop, NP , DNP, AGPCNP-BC  COVID-19 PATIENT SCREENING TOOL Asked and negative response unless otherwise noted:  Have you had symptoms of covid, tested positive or been in contact with someone with symptoms/positive test in the past 5-10 days?

## 2021-08-07 DIAGNOSIS — Z7982 Long term (current) use of aspirin: Secondary | ICD-10-CM | POA: Diagnosis not present

## 2021-08-07 DIAGNOSIS — L03116 Cellulitis of left lower limb: Secondary | ICD-10-CM | POA: Diagnosis not present

## 2021-08-07 DIAGNOSIS — D696 Thrombocytopenia, unspecified: Secondary | ICD-10-CM | POA: Diagnosis not present

## 2021-08-07 DIAGNOSIS — F419 Anxiety disorder, unspecified: Secondary | ICD-10-CM | POA: Diagnosis not present

## 2021-08-07 DIAGNOSIS — L89896 Pressure-induced deep tissue damage of other site: Secondary | ICD-10-CM | POA: Diagnosis not present

## 2021-08-07 DIAGNOSIS — G8929 Other chronic pain: Secondary | ICD-10-CM | POA: Diagnosis not present

## 2021-08-07 DIAGNOSIS — C61 Malignant neoplasm of prostate: Secondary | ICD-10-CM | POA: Diagnosis not present

## 2021-08-07 DIAGNOSIS — E669 Obesity, unspecified: Secondary | ICD-10-CM | POA: Diagnosis not present

## 2021-08-07 DIAGNOSIS — L03115 Cellulitis of right lower limb: Secondary | ICD-10-CM | POA: Diagnosis not present

## 2021-08-07 DIAGNOSIS — Z6837 Body mass index (BMI) 37.0-37.9, adult: Secondary | ICD-10-CM | POA: Diagnosis not present

## 2021-08-07 DIAGNOSIS — F32A Depression, unspecified: Secondary | ICD-10-CM | POA: Diagnosis not present

## 2021-08-07 DIAGNOSIS — K7460 Unspecified cirrhosis of liver: Secondary | ICD-10-CM | POA: Diagnosis not present

## 2021-08-07 DIAGNOSIS — I4891 Unspecified atrial fibrillation: Secondary | ICD-10-CM | POA: Diagnosis not present

## 2021-08-07 DIAGNOSIS — I739 Peripheral vascular disease, unspecified: Secondary | ICD-10-CM | POA: Diagnosis not present

## 2021-08-07 DIAGNOSIS — Z9181 History of falling: Secondary | ICD-10-CM | POA: Diagnosis not present

## 2021-08-07 DIAGNOSIS — N3946 Mixed incontinence: Secondary | ICD-10-CM | POA: Diagnosis not present

## 2021-08-07 DIAGNOSIS — E559 Vitamin D deficiency, unspecified: Secondary | ICD-10-CM | POA: Diagnosis not present

## 2021-08-07 DIAGNOSIS — G629 Polyneuropathy, unspecified: Secondary | ICD-10-CM | POA: Diagnosis not present

## 2021-08-07 DIAGNOSIS — E079 Disorder of thyroid, unspecified: Secondary | ICD-10-CM | POA: Diagnosis not present

## 2021-08-07 DIAGNOSIS — I890 Lymphedema, not elsewhere classified: Secondary | ICD-10-CM | POA: Diagnosis not present

## 2021-08-07 DIAGNOSIS — I10 Essential (primary) hypertension: Secondary | ICD-10-CM | POA: Diagnosis not present

## 2021-08-07 DIAGNOSIS — J449 Chronic obstructive pulmonary disease, unspecified: Secondary | ICD-10-CM | POA: Diagnosis not present

## 2021-08-07 DIAGNOSIS — K219 Gastro-esophageal reflux disease without esophagitis: Secondary | ICD-10-CM | POA: Diagnosis not present

## 2021-08-08 ENCOUNTER — Other Ambulatory Visit: Payer: Self-pay | Admitting: Oncology

## 2021-08-08 ENCOUNTER — Other Ambulatory Visit (HOSPITAL_COMMUNITY): Payer: Self-pay

## 2021-08-08 DIAGNOSIS — E669 Obesity, unspecified: Secondary | ICD-10-CM | POA: Diagnosis not present

## 2021-08-08 DIAGNOSIS — N3946 Mixed incontinence: Secondary | ICD-10-CM | POA: Diagnosis not present

## 2021-08-08 DIAGNOSIS — K7460 Unspecified cirrhosis of liver: Secondary | ICD-10-CM | POA: Diagnosis not present

## 2021-08-08 DIAGNOSIS — C61 Malignant neoplasm of prostate: Secondary | ICD-10-CM

## 2021-08-08 DIAGNOSIS — E559 Vitamin D deficiency, unspecified: Secondary | ICD-10-CM | POA: Diagnosis not present

## 2021-08-08 DIAGNOSIS — F32A Depression, unspecified: Secondary | ICD-10-CM | POA: Diagnosis not present

## 2021-08-08 DIAGNOSIS — I4891 Unspecified atrial fibrillation: Secondary | ICD-10-CM | POA: Diagnosis not present

## 2021-08-08 DIAGNOSIS — F419 Anxiety disorder, unspecified: Secondary | ICD-10-CM | POA: Diagnosis not present

## 2021-08-08 DIAGNOSIS — L03115 Cellulitis of right lower limb: Secondary | ICD-10-CM | POA: Diagnosis not present

## 2021-08-08 DIAGNOSIS — I739 Peripheral vascular disease, unspecified: Secondary | ICD-10-CM | POA: Diagnosis not present

## 2021-08-08 DIAGNOSIS — L03116 Cellulitis of left lower limb: Secondary | ICD-10-CM | POA: Diagnosis not present

## 2021-08-08 DIAGNOSIS — G8929 Other chronic pain: Secondary | ICD-10-CM | POA: Diagnosis not present

## 2021-08-08 DIAGNOSIS — D696 Thrombocytopenia, unspecified: Secondary | ICD-10-CM | POA: Diagnosis not present

## 2021-08-08 DIAGNOSIS — K219 Gastro-esophageal reflux disease without esophagitis: Secondary | ICD-10-CM | POA: Diagnosis not present

## 2021-08-08 DIAGNOSIS — J449 Chronic obstructive pulmonary disease, unspecified: Secondary | ICD-10-CM | POA: Diagnosis not present

## 2021-08-08 DIAGNOSIS — Z9181 History of falling: Secondary | ICD-10-CM | POA: Diagnosis not present

## 2021-08-08 DIAGNOSIS — L89896 Pressure-induced deep tissue damage of other site: Secondary | ICD-10-CM | POA: Diagnosis not present

## 2021-08-08 DIAGNOSIS — I10 Essential (primary) hypertension: Secondary | ICD-10-CM | POA: Diagnosis not present

## 2021-08-08 DIAGNOSIS — E079 Disorder of thyroid, unspecified: Secondary | ICD-10-CM | POA: Diagnosis not present

## 2021-08-08 DIAGNOSIS — Z6837 Body mass index (BMI) 37.0-37.9, adult: Secondary | ICD-10-CM | POA: Diagnosis not present

## 2021-08-08 DIAGNOSIS — G629 Polyneuropathy, unspecified: Secondary | ICD-10-CM | POA: Diagnosis not present

## 2021-08-08 DIAGNOSIS — I890 Lymphedema, not elsewhere classified: Secondary | ICD-10-CM | POA: Diagnosis not present

## 2021-08-08 DIAGNOSIS — Z7982 Long term (current) use of aspirin: Secondary | ICD-10-CM | POA: Diagnosis not present

## 2021-08-08 MED ORDER — ENZALUTAMIDE 40 MG PO TABS
160.0000 mg | ORAL_TABLET | Freq: Every day | ORAL | 2 refills | Status: DC
  Filled 2021-08-08: qty 120, 30d supply, fill #0

## 2021-08-08 NOTE — Telephone Encounter (Signed)
CBC with Differential Order: [id number] Status: Final result    Visible to patient: Yes (not seen)    Next appt: 09/03/2021 at 02:30 PM in [place] Jason Coop, NP)    0 Result Notes Component Ref Range & Units 2 wk ago  (07/19/21) 1 mo ago  (06/30/21) 1 mo ago  (06/29/21) 1 mo ago  (06/28/21) 1 mo ago  (06/17/21) 2 mo ago  (06/05/21) 2 mo ago  (05/22/21)  WBC 4.0 - 10.5 K/uL 4.1  3.5 Low   4.9  11.1 High   3.9 Low   2.4 Low     RBC 4.22 - 5.81 MIL/uL 3.45 Low   3.19 Low   3.16 Low   3.68 Low   3.66 Low   3.56 Low   None seen R   Hemoglobin 13.0 - 17.0 g/dL 12.6 Low   11.5 Low   11.4 Low   13.2  13.4  12.6 Low     HCT 39.0 - 52.0 % 37.8 Low   33.0 Low   33.2 Low   38.4 Low   39.3  37.4 Low     MCV 80.0 - 100.0 fL 109.6 High   103.4 High   105.1 High   104.3 High   107.4 High   105.1 High     MCH 26.0 - 34.0 pg 36.5 High   36.1 High   36.1 High   35.9 High   36.6 High   35.4 High     MCHC 30.0 - 36.0 g/dL 33.3  34.8  34.3  34.4  34.1  33.7    RDW 11.5 - 15.5 % 15.3  14.6  14.7  15.0  15.3  15.0    Platelets 150 - 400 K/uL 42 Low   31 Low  CM  34 Low  CM  49 Low  CM  41 Low  CM  36 Low  CM    Comment: Immature Platelet Fraction may be  clinically indicated, consider  ordering this additional test  [id number]   nRBC 0.0 - 0.2 % 0.0  0.0 CM  0.0 CM  0.0 CM  0.0 CM  0.0    Neutrophils Relative % % 73      69    Neutro Abs 1.7 - 7.7 K/uL 3.0      1.7    Lymphocytes Relative % 13      18    Lymphs Abs 0.7 - 4.0 K/uL 0.5 Low       0.5 Low     Monocytes Relative % 11      10    Monocytes Absolute 0.1 - 1.0 K/uL 0.5      0.2    Eosinophils Relative % 1      3    Eosinophils Absolute 0.0 - 0.5 K/uL 0.0      0.1    Basophils Relative % 1      0    Basophils Absolute 0.0 - 0.1 K/uL 0.0      0.0    Immature Granulocytes % 1      0    Abs Immature Granulocytes 0.00 - 0.07 K/uL 0.02      0.01 CM    Comment: Performed at [facility], [address], [place], [place] [address]   WBC Morphology       MORPHOLOGY UNREMARKABLE    RBC Morphology       MORPHOLOGY UNREMARKABLE    Smear Review  Normal platelet morphology CM    WBC, UA        >30 High  R   Epithelial Cells (non renal)        0-10 R   Mucus, UA        Present Abnormal  R   Bacteria, UA        Many Abnormal  R   Resulting Agency  [facility]         Specimen Collected: 07/19/21 12:55 Last Resulted: 07/19/21 14:21      Lab Flowsheet    Order Details    View Encounter    Lab and Collection Details    Routing    Result History    View Encounter Conversation      CM=Additional comments  R=Reference range differs from displayed range      Result Care Coordination   Patient Communication   Add Comments   Add Notifications  Back to Top       Other Results from 07/19/2021   Contains abnormal data Brain natriuretic peptide Order: [id number] Status: Final result    Visible to patient: Yes (not seen)    Next appt: 09/03/2021 at 02:30 PM in [place] Jason Coop, NP)    0 Result Notes Component Ref Range & Units 2 wk ago  (07/19/21) 1 mo ago  (06/28/21) 1 mo ago  (06/18/21)  B Natriuretic Peptide 0.0 - 100.0 pg/mL 252.0 High   100.6 High  CM  300.5 High  CM   Comment: Performed at [facility], [address], [place], [place] [address]  Resulting Agency  [facility]         Specimen Collected: 07/19/21 12:55 Last Resulted: 07/19/21 14:03      Lab Flowsheet    Order Details    View Encounter    Lab and Collection Details    Routing    Result History    View Encounter Conversation      CM=Additional comments      Result Care Coordination   Patient Communication   Add Comments   Add Notifications  Back to Top          Contains abnormal data Basic metabolic panel Order: [id number] Status: Final result    Visible to patient: Yes (not seen)    Next appt:  09/03/2021 at 02:30 PM in [place] Jason Coop, NP)    0 Result Notes Component Ref Range & Units 2 wk ago  (07/19/21) 1 mo ago  (06/29/21) 1 mo ago  (06/28/21) 1 mo ago  (06/19/21) 1 mo ago  (06/17/21) 2 mo ago  (06/05/21) 3 mo ago  (05/03/21)  Sodium 135 - 145 mmol/L 132 Low   135  136  142  139  142  138   Potassium 3.5 - 5.1 mmol/L 3.4 Low   3.4 Low   4.1  4.0  3.4 Low   3.8  4.2   Chloride 98 - 111 mmol/L 97 Low   102  101  109  108  106  105   CO2 22 - 32 mmol/L 25  27  27  26  24  27  26    Glucose, Bld 70 - 99 mg/dL 103 High   86 CM  127 High  CM  134 High  CM  111 High  CM  96 CM  105 High  CM   Comment: Glucose reference range applies only to samples taken after fasting for at least 8 hours.  BUN 8 - 23 mg/dL 17  16  16  24  High   22  19  19    Creatinine, Ser 0.61 - 1.24 mg/dL 0.55 Low   0.63  0.88  0.56 Low   0.69  0.62  0.72   Calcium 8.9 - 10.3 mg/dL 7.1 Low   7.3 Low   8.0 Low   7.9 Low   8.2 Low   8.8 Low   8.8 Low    GFR, Estimated >60 mL/min >60  >60 CM  >60 CM  >60 CM  >60 CM  >60 CM  >60 CM   Comment: (NOTE)  Calculated using the CKD-EPI Creatinine Equation (2021)   Anion gap 5 - 15 10  6  CM  8 CM  7 CM  7 CM  9 CM  7 CM   Comment: Performed at [facility], [address], [place], [place] [address]  Resulting Agency  [facility] [place] [facility]         Specimen Collected: 07/19/21 12:55 Last Resulted: 07/19/21 13:50

## 2021-08-09 ENCOUNTER — Telehealth: Payer: Self-pay

## 2021-08-09 ENCOUNTER — Other Ambulatory Visit: Payer: Self-pay

## 2021-08-09 ENCOUNTER — Ambulatory Visit: Payer: PPO | Admitting: Oncology

## 2021-08-09 ENCOUNTER — Other Ambulatory Visit: Payer: PPO

## 2021-08-09 ENCOUNTER — Emergency Department: Payer: PPO

## 2021-08-09 ENCOUNTER — Ambulatory Visit: Payer: PPO

## 2021-08-09 DIAGNOSIS — R0902 Hypoxemia: Secondary | ICD-10-CM | POA: Diagnosis not present

## 2021-08-09 DIAGNOSIS — Z20822 Contact with and (suspected) exposure to covid-19: Secondary | ICD-10-CM | POA: Diagnosis present

## 2021-08-09 DIAGNOSIS — Z9079 Acquired absence of other genital organ(s): Secondary | ICD-10-CM

## 2021-08-09 DIAGNOSIS — L89152 Pressure ulcer of sacral region, stage 2: Secondary | ICD-10-CM | POA: Diagnosis not present

## 2021-08-09 DIAGNOSIS — G4730 Sleep apnea, unspecified: Secondary | ICD-10-CM | POA: Diagnosis not present

## 2021-08-09 DIAGNOSIS — E871 Hypo-osmolality and hyponatremia: Secondary | ICD-10-CM | POA: Diagnosis not present

## 2021-08-09 DIAGNOSIS — S80822A Blister (nonthermal), left lower leg, initial encounter: Secondary | ICD-10-CM | POA: Diagnosis not present

## 2021-08-09 DIAGNOSIS — N320 Bladder-neck obstruction: Secondary | ICD-10-CM | POA: Diagnosis not present

## 2021-08-09 DIAGNOSIS — J449 Chronic obstructive pulmonary disease, unspecified: Secondary | ICD-10-CM | POA: Diagnosis not present

## 2021-08-09 DIAGNOSIS — G629 Polyneuropathy, unspecified: Secondary | ICD-10-CM | POA: Diagnosis not present

## 2021-08-09 DIAGNOSIS — E876 Hypokalemia: Secondary | ICD-10-CM | POA: Diagnosis not present

## 2021-08-09 DIAGNOSIS — K766 Portal hypertension: Secondary | ICD-10-CM | POA: Diagnosis not present

## 2021-08-09 DIAGNOSIS — L97919 Non-pressure chronic ulcer of unspecified part of right lower leg with unspecified severity: Secondary | ICD-10-CM | POA: Diagnosis not present

## 2021-08-09 DIAGNOSIS — R600 Localized edema: Secondary | ICD-10-CM | POA: Diagnosis not present

## 2021-08-09 DIAGNOSIS — R000 Tachycardia, unspecified: Secondary | ICD-10-CM | POA: Diagnosis not present

## 2021-08-09 DIAGNOSIS — C61 Malignant neoplasm of prostate: Secondary | ICD-10-CM | POA: Diagnosis present

## 2021-08-09 DIAGNOSIS — Z79899 Other long term (current) drug therapy: Secondary | ICD-10-CM

## 2021-08-09 DIAGNOSIS — I480 Paroxysmal atrial fibrillation: Secondary | ICD-10-CM | POA: Diagnosis not present

## 2021-08-09 DIAGNOSIS — I878 Other specified disorders of veins: Secondary | ICD-10-CM | POA: Diagnosis present

## 2021-08-09 DIAGNOSIS — L03115 Cellulitis of right lower limb: Principal | ICD-10-CM | POA: Diagnosis present

## 2021-08-09 DIAGNOSIS — Z952 Presence of prosthetic heart valve: Secondary | ICD-10-CM

## 2021-08-09 DIAGNOSIS — L0390 Cellulitis, unspecified: Secondary | ICD-10-CM | POA: Diagnosis not present

## 2021-08-09 DIAGNOSIS — R2243 Localized swelling, mass and lump, lower limb, bilateral: Secondary | ICD-10-CM | POA: Diagnosis not present

## 2021-08-09 DIAGNOSIS — L97519 Non-pressure chronic ulcer of other part of right foot with unspecified severity: Secondary | ICD-10-CM | POA: Diagnosis not present

## 2021-08-09 DIAGNOSIS — Z7982 Long term (current) use of aspirin: Secondary | ICD-10-CM

## 2021-08-09 DIAGNOSIS — D696 Thrombocytopenia, unspecified: Secondary | ICD-10-CM | POA: Diagnosis present

## 2021-08-09 DIAGNOSIS — Z79891 Long term (current) use of opiate analgesic: Secondary | ICD-10-CM

## 2021-08-09 DIAGNOSIS — E8770 Fluid overload, unspecified: Secondary | ICD-10-CM | POA: Diagnosis present

## 2021-08-09 DIAGNOSIS — Z66 Do not resuscitate: Secondary | ICD-10-CM | POA: Diagnosis present

## 2021-08-09 DIAGNOSIS — S80821A Blister (nonthermal), right lower leg, initial encounter: Secondary | ICD-10-CM | POA: Diagnosis present

## 2021-08-09 DIAGNOSIS — M7989 Other specified soft tissue disorders: Secondary | ICD-10-CM | POA: Diagnosis not present

## 2021-08-09 DIAGNOSIS — I10 Essential (primary) hypertension: Secondary | ICD-10-CM | POA: Diagnosis not present

## 2021-08-09 DIAGNOSIS — I890 Lymphedema, not elsewhere classified: Secondary | ICD-10-CM | POA: Diagnosis not present

## 2021-08-09 DIAGNOSIS — Z515 Encounter for palliative care: Secondary | ICD-10-CM

## 2021-08-09 DIAGNOSIS — R609 Edema, unspecified: Secondary | ICD-10-CM | POA: Diagnosis not present

## 2021-08-09 DIAGNOSIS — R680 Hypothermia, not associated with low environmental temperature: Secondary | ICD-10-CM | POA: Diagnosis present

## 2021-08-09 DIAGNOSIS — M1990 Unspecified osteoarthritis, unspecified site: Secondary | ICD-10-CM | POA: Diagnosis present

## 2021-08-09 DIAGNOSIS — R5381 Other malaise: Secondary | ICD-10-CM | POA: Diagnosis not present

## 2021-08-09 DIAGNOSIS — R531 Weakness: Secondary | ICD-10-CM | POA: Diagnosis not present

## 2021-08-09 DIAGNOSIS — K7460 Unspecified cirrhosis of liver: Secondary | ICD-10-CM | POA: Diagnosis not present

## 2021-08-09 DIAGNOSIS — I517 Cardiomegaly: Secondary | ICD-10-CM | POA: Diagnosis not present

## 2021-08-09 DIAGNOSIS — J438 Other emphysema: Secondary | ICD-10-CM | POA: Diagnosis not present

## 2021-08-09 DIAGNOSIS — Z7401 Bed confinement status: Secondary | ICD-10-CM | POA: Diagnosis not present

## 2021-08-09 LAB — COMPREHENSIVE METABOLIC PANEL
ALT: 19 U/L (ref 0–44)
AST: 25 U/L (ref 15–41)
Albumin: 2.7 g/dL — ABNORMAL LOW (ref 3.5–5.0)
Alkaline Phosphatase: 73 U/L (ref 38–126)
Anion gap: 11 (ref 5–15)
BUN: 17 mg/dL (ref 8–23)
CO2: 27 mmol/L (ref 22–32)
Calcium: 7.2 mg/dL — ABNORMAL LOW (ref 8.9–10.3)
Chloride: 96 mmol/L — ABNORMAL LOW (ref 98–111)
Creatinine, Ser: 0.55 mg/dL — ABNORMAL LOW (ref 0.61–1.24)
GFR, Estimated: >60 mL/min (ref >60)
Glucose, Bld: 105 mg/dL — ABNORMAL HIGH (ref 70–99)
Potassium: 3.1 mmol/L — ABNORMAL LOW (ref 3.5–5.1)
Sodium: 134 mmol/L — ABNORMAL LOW (ref 135–145)
Total Bilirubin: 5 mg/dL — ABNORMAL HIGH (ref 0.3–1.2)
Total Protein: 5.4 g/dL — ABNORMAL LOW (ref 6.5–8.1)

## 2021-08-09 LAB — CBC WITH DIFFERENTIAL/PLATELET
Abs Immature Granulocytes: 0.04 10*3/uL (ref 0.00–0.07)
Basophils Absolute: 0 10*3/uL (ref 0.0–0.1)
Basophils Relative: 0 %
Eosinophils Absolute: 0 10*3/uL (ref 0.0–0.5)
Eosinophils Relative: 0 %
HCT: 39.4 % (ref 39.0–52.0)
Hemoglobin: 13.6 g/dL (ref 13.0–17.0)
Immature Granulocytes: 1 %
Lymphocytes Relative: 9 %
Lymphs Abs: 0.5 10*3/uL — ABNORMAL LOW (ref 0.7–4.0)
MCH: 35.6 pg — ABNORMAL HIGH (ref 26.0–34.0)
MCHC: 34.5 g/dL (ref 30.0–36.0)
MCV: 103.1 fL — ABNORMAL HIGH (ref 80.0–100.0)
Monocytes Absolute: 0.5 10*3/uL (ref 0.1–1.0)
Monocytes Relative: 8 %
Neutro Abs: 4.9 10*3/uL (ref 1.7–7.7)
Neutrophils Relative %: 82 %
Platelets: 48 10*3/uL — ABNORMAL LOW (ref 150–400)
RBC: 3.82 MIL/uL — ABNORMAL LOW (ref 4.22–5.81)
RDW: 15.3 % (ref 11.5–15.5)
WBC: 6 10*3/uL (ref 4.0–10.5)
nRBC: 0 % (ref 0.0–0.2)

## 2021-08-09 LAB — TROPONIN I (HIGH SENSITIVITY): Troponin I (High Sensitivity): 8 ng/L (ref <18)

## 2021-08-09 LAB — LACTIC ACID, PLASMA: Lactic Acid, Venous: 2.5 mmol/L (ref 0.5–1.9)

## 2021-08-09 NOTE — ED Provider Triage Note (Signed)
Emergency Medicine Provider Triage Evaluation Note  Earl Ramirez , a 81 y.o. male  was evaluated in triage.  Pt complains of bilateral leg swelling.  Patient states that he has had problems such as this in the past and has been seen at [facility] where he was hospitalized.  Patient states when his 20 days ran out on his insurance he was discharged.  He has had weeping from his legs along with the swelling.  Is become difficult for him to walk due to swelling in his legs and feet and patient lives alone.  Review of Systems  Positive: Bilateral leg edema, difficulty to ambulate. Negative: No fever, chills, nausea or vomiting.  Physical Exam  BP 125/65    Pulse (!) 107    Resp 20    Ht 5\' 7"  (1.702 m)    Wt 107 kg    SpO2 99%    BMI 36.95 kg/m  Gen:   Awake, no distress.  Patient is able to answer questions without any shortness of breath noted and seems to be answering questions appropriately. Resp:  Normal effort lungs grossly clear to auscultation. MSK:   Moves extremities.  Marked edema noted lower extremities.  Patient has what looks like an Haematologist and an Ace wrap around both legs.  There is serous fluid draining from his lower extremities.  Nails on the feet are fungal and capillary refill is unable to assess.   Medical Decision Making  Medically screening exam initiated at 1:45 PM.  Appropriate orders placed.  Earl Ramirez was informed that the remainder of the evaluation will be completed by another provider, this initial triage assessment does not replace that evaluation, and the importance of remaining in the ED until their evaluation is complete.     Earl Hai, PA-C 08/09/21 1354

## 2021-08-09 NOTE — ED Triage Notes (Signed)
Pt to ED for bilateral leg swelling, sent from [facility] nurse.  Significant swelling to BLE noted Reports difficulty walking

## 2021-08-09 NOTE — Telephone Encounter (Signed)
PC SW outreached patient, per [facility] NP - K. Smith request to assist wit addressing needs around legal issues.   Patient stated to SW that he could not remember any specific questions he had about any legal issues at the moment. SW provided contact info for patient to outreach SW should any questions arise.

## 2021-08-10 ENCOUNTER — Inpatient Hospital Stay: Payer: PPO

## 2021-08-10 ENCOUNTER — Inpatient Hospital Stay
Admission: EM | Admit: 2021-08-10 | Discharge: 2021-08-17 | DRG: 603 | Disposition: A | Discharge status: Hospice | Payer: PPO | Attending: Internal Medicine | Admitting: Internal Medicine

## 2021-08-10 ENCOUNTER — Encounter: Payer: Self-pay | Admitting: Internal Medicine

## 2021-08-10 DIAGNOSIS — G4730 Sleep apnea, unspecified: Secondary | ICD-10-CM | POA: Diagnosis present

## 2021-08-10 DIAGNOSIS — I480 Paroxysmal atrial fibrillation: Secondary | ICD-10-CM | POA: Diagnosis present

## 2021-08-10 DIAGNOSIS — J449 Chronic obstructive pulmonary disease, unspecified: Secondary | ICD-10-CM | POA: Diagnosis present

## 2021-08-10 DIAGNOSIS — Z515 Encounter for palliative care: Secondary | ICD-10-CM | POA: Diagnosis not present

## 2021-08-10 DIAGNOSIS — C61 Malignant neoplasm of prostate: Secondary | ICD-10-CM | POA: Diagnosis present

## 2021-08-10 DIAGNOSIS — R609 Edema, unspecified: Secondary | ICD-10-CM

## 2021-08-10 DIAGNOSIS — E876 Hypokalemia: Secondary | ICD-10-CM | POA: Diagnosis present

## 2021-08-10 DIAGNOSIS — G629 Polyneuropathy, unspecified: Secondary | ICD-10-CM | POA: Diagnosis present

## 2021-08-10 DIAGNOSIS — J438 Other emphysema: Secondary | ICD-10-CM | POA: Diagnosis not present

## 2021-08-10 DIAGNOSIS — K7460 Unspecified cirrhosis of liver: Secondary | ICD-10-CM | POA: Diagnosis present

## 2021-08-10 DIAGNOSIS — S80821A Blister (nonthermal), right lower leg, initial encounter: Secondary | ICD-10-CM | POA: Diagnosis present

## 2021-08-10 DIAGNOSIS — L89152 Pressure ulcer of sacral region, stage 2: Secondary | ICD-10-CM | POA: Diagnosis present

## 2021-08-10 DIAGNOSIS — L03115 Cellulitis of right lower limb: Secondary | ICD-10-CM | POA: Diagnosis present

## 2021-08-10 DIAGNOSIS — Z66 Do not resuscitate: Secondary | ICD-10-CM | POA: Diagnosis present

## 2021-08-10 DIAGNOSIS — I890 Lymphedema, not elsewhere classified: Secondary | ICD-10-CM

## 2021-08-10 DIAGNOSIS — M7989 Other specified soft tissue disorders: Secondary | ICD-10-CM

## 2021-08-10 DIAGNOSIS — K766 Portal hypertension: Secondary | ICD-10-CM | POA: Diagnosis present

## 2021-08-10 DIAGNOSIS — E8770 Fluid overload, unspecified: Secondary | ICD-10-CM | POA: Diagnosis present

## 2021-08-10 DIAGNOSIS — D696 Thrombocytopenia, unspecified: Secondary | ICD-10-CM | POA: Diagnosis present

## 2021-08-10 DIAGNOSIS — L8990 Pressure ulcer of unspecified site, unspecified stage: Secondary | ICD-10-CM | POA: Insufficient documentation

## 2021-08-10 DIAGNOSIS — L0390 Cellulitis, unspecified: Secondary | ICD-10-CM | POA: Diagnosis present

## 2021-08-10 DIAGNOSIS — E871 Hypo-osmolality and hyponatremia: Secondary | ICD-10-CM | POA: Diagnosis present

## 2021-08-10 DIAGNOSIS — L97919 Non-pressure chronic ulcer of unspecified part of right lower leg with unspecified severity: Secondary | ICD-10-CM | POA: Diagnosis present

## 2021-08-10 DIAGNOSIS — Z952 Presence of prosthetic heart valve: Secondary | ICD-10-CM | POA: Diagnosis not present

## 2021-08-10 DIAGNOSIS — L97519 Non-pressure chronic ulcer of other part of right foot with unspecified severity: Secondary | ICD-10-CM | POA: Diagnosis present

## 2021-08-10 DIAGNOSIS — I4891 Unspecified atrial fibrillation: Secondary | ICD-10-CM

## 2021-08-10 DIAGNOSIS — N320 Bladder-neck obstruction: Secondary | ICD-10-CM | POA: Diagnosis present

## 2021-08-10 DIAGNOSIS — R531 Weakness: Secondary | ICD-10-CM

## 2021-08-10 DIAGNOSIS — I10 Essential (primary) hypertension: Secondary | ICD-10-CM | POA: Diagnosis present

## 2021-08-10 DIAGNOSIS — S80822A Blister (nonthermal), left lower leg, initial encounter: Secondary | ICD-10-CM | POA: Diagnosis present

## 2021-08-10 DIAGNOSIS — Z20822 Contact with and (suspected) exposure to covid-19: Secondary | ICD-10-CM | POA: Diagnosis present

## 2021-08-10 LAB — RESP PANEL BY RT-PCR (FLU A&B, COVID) ARPGX2
Influenza A by PCR: NEGATIVE
Influenza B by PCR: NEGATIVE
SARS Coronavirus 2 by RT PCR: NEGATIVE

## 2021-08-10 LAB — TROPONIN I (HIGH SENSITIVITY): Troponin I (High Sensitivity): 8 ng/L (ref <18)

## 2021-08-10 LAB — PROCALCITONIN: Procalcitonin: 0.22 ng/mL

## 2021-08-10 LAB — BRAIN NATRIURETIC PEPTIDE: B Natriuretic Peptide: 100.7 pg/mL — ABNORMAL HIGH (ref 0.0–100.0)

## 2021-08-10 LAB — LACTIC ACID, PLASMA: Lactic Acid, Venous: 2.7 mmol/L (ref 0.5–1.9)

## 2021-08-10 MED ORDER — TRAMADOL-ACETAMINOPHEN 37.5-325 MG PO TABS
1.0000 | ORAL_TABLET | Freq: Four times a day (QID) | ORAL | Status: DC | PRN
  Administered 2021-08-10 – 2021-08-11 (×4): 2 via ORAL
  Administered 2021-08-12 – 2021-08-14 (×4): 1 via ORAL
  Filled 2021-08-10: qty 1
  Filled 2021-08-10: qty 2
  Filled 2021-08-10 (×4): qty 1
  Filled 2021-08-10 (×3): qty 2
  Filled 2021-08-10: qty 1

## 2021-08-10 MED ORDER — SODIUM CHLORIDE 0.9 % IV SOLN
1.0000 g | INTRAVENOUS | Status: AC
  Administered 2021-08-10 – 2021-08-16 (×7): 1 g via INTRAVENOUS
  Filled 2021-08-10 (×5): qty 1
  Filled 2021-08-10: qty 10
  Filled 2021-08-10: qty 1

## 2021-08-10 MED ORDER — ACETAMINOPHEN 325 MG PO TABS
650.0000 mg | ORAL_TABLET | Freq: Four times a day (QID) | ORAL | Status: DC | PRN
  Administered 2021-08-10: 650 mg via ORAL
  Filled 2021-08-10: qty 2

## 2021-08-10 MED ORDER — ONDANSETRON HCL 4 MG/2ML IJ SOLN
4.0000 mg | Freq: Four times a day (QID) | INTRAMUSCULAR | Status: DC | PRN
  Administered 2021-08-11: 14:00:00 4 mg via INTRAVENOUS
  Filled 2021-08-10: qty 2

## 2021-08-10 MED ORDER — FUROSEMIDE 10 MG/ML IJ SOLN
40.0000 mg | Freq: Once | INTRAMUSCULAR | Status: AC
  Administered 2021-08-10: 40 mg via INTRAVENOUS
  Filled 2021-08-10: qty 4

## 2021-08-10 MED ORDER — ONDANSETRON HCL 4 MG PO TABS: 4.0000 mg | ORAL_TABLET | Freq: Four times a day (QID) | ORAL | Status: DC | PRN

## 2021-08-10 MED ORDER — ACETAMINOPHEN 650 MG RE SUPP: 650.0000 mg | Freq: Four times a day (QID) | RECTAL | Status: DC | PRN

## 2021-08-10 NOTE — ED Provider Notes (Signed)
[facility] Provider Note    Event Date/Time   First MD Initiated Contact with Patient 08/10/21 [phone]     (approximate)   History   Leg Swelling   HPI  Earl Ramirez is a 81 y.o. male with history of atrial fibrillation, hypertension, COPD, prostate cancer, aortic valve replacement who presents to the emergency department with complaints of increased leg swelling.  States he has had leg swelling for about the past month but is significantly worsened over the past day.  States because of his leg swelling he is not able to walk and feels very weak.  He states he has had intermittent shortness of breath but no chest pain.  No fevers or cough.  No abdominal pain, vomiting or diarrhea.  States he is not sure if he is on any diuretics at home.  States he does not think that he has been urinating but has soaked his brief and jeans.  Patient lives at home alone.  Walks with a walker.   History provided by patient.      Past Medical History:  Diagnosis Date   Aortic valve replaced    1998   Arthritis    Atrial fibrillation (HCC)    COPD (chronic obstructive pulmonary disease) (HCC)    Dysrhythmia    atrial fib   Hernia of abdominal wall    Hypertension    Indigestion    Neuropathy    Neuropathy    Prostate cancer ([place])    Sleep apnea    Thrombocytopenia (HCC)    Tremors of nervous system    Urinary problem     Past Surgical History:  Procedure Laterality Date   CALDWELL LUC     tongue      growth removed   TRANSURETHRAL RESECTION OF PROSTATE N/A 01/18/2019   Procedure: CHANNEL TRANSURETHRAL RESECTION OF THE PROSTATE (TURP);  Surgeon: [name], MD;  Location: [facility] ORS;  Service: Urology;  Laterality: N/A;   VALVE REPLACEMENT      MEDICATIONS:  Prior to Admission medications   Medication Sig Start Date End Date Taking? Authorizing Provider  ALPRAZolam (XANAX) 0.25 MG tablet Take 1 tablet (0.25 mg total) by mouth daily as needed for up to 4  doses for anxiety. Patient taking differently: Take 0.25 mg by mouth every 6 (six) hours as needed for anxiety. 07/02/21   Antonieta Pert, MD  aspirin EC 81 MG tablet Take 81 mg by mouth daily.     [provider]  calcium carbonate (TUMS - DOSED IN MG ELEMENTAL CALCIUM) 500 MG chewable tablet Chew 1 tablet by mouth daily.     [provider]  docusate sodium (COLACE) 100 MG capsule Take 100 mg by mouth daily as needed for mild constipation.    [provider]  enzalutamide Gillermina Phy) 40 MG tablet Take 4 tablets (160 mg total) by mouth daily. 08/08/21   Lloyd Huger, MD  furosemide (LASIX) 20 MG tablet Take 20 mg by mouth daily. 05/16/20   [provider]  gabapentin (NEURONTIN) 300 MG capsule Take 900 mg by mouth 4 (four) times daily.  09/22/19   [provider]  metoprolol succinate (TOPROL-XL) 25 MG 24 hr tablet Take 25 mg by mouth daily.  05/10/19   [provider]  mirabegron ER (MYRBETRIQ) 50 MG TB24 tablet Take 1 tablet (50 mg total) by mouth daily. Patient not taking: Reported on 07/19/2021 04/26/21   [name], MD  Multiple Vitamins-Minerals (MULTIVITAMIN WITH  MINERALS) tablet Take 1 tablet by mouth daily.    [provider]  oxybutynin (DITROPAN-XL) 10 MG 24 hr tablet Take 10 mg by mouth daily.    [provider]  tamsulosin (FLOMAX) 0.4 MG CAPS capsule Take 1 capsule by mouth twice daily Patient taking differently: Take 0.4 mg by mouth daily. 04/16/21   [name], MD  traMADol (ULTRAM) 50 MG tablet Take 50 mg by mouth every 6 (six) hours as needed for moderate pain.    [provider]    Physical Exam   Triage Vital Signs: ED Triage Vitals  Enc Vitals Group     BP 08/09/21 1342 125/65     Pulse Rate 08/09/21 1342 (!) 107     Resp 08/09/21 1342 20     Temp 08/09/21 2322 97.6 F (36.4 C)     Temp Source 08/09/21 2139 Oral     SpO2 08/09/21 1342 99 %     Weight 08/09/21 1342 235 lb 14.3 oz  (107 kg)     Height 08/09/21 1342 5\' 7"  (1.702 m)     Head Circumference --      Peak Flow --      Pain Score 08/09/21 1342 6     Pain Loc --      Pain Edu? --      Excl. in [place]? --     Most recent vital signs: Vitals:   08/10/21 0148 08/10/21 0215  BP:  137/72  Pulse:  (!) 102  Resp:  18  Temp: (!) 97 F (36.1 C)   SpO2:  100%    CONSTITUTIONAL: Alert and oriented and responds appropriately to questions.  Elderly, chronically ill-appearing HEAD: Normocephalic, atraumatic EYES: Conjunctivae clear, pupils appear equal, sclera nonicteric ENT: normal nose; moist mucous membranes NECK: Supple, normal ROM CARD: Regular and minimally tachycardic; S1 and S2 appreciated; + murmur, no clicks, no rubs, no gallops RESP: Normal chest excursion without splinting or tachypnea; breath sounds clear and equal bilaterally; no wheezes, no rhonchi, no rales, no hypoxia or respiratory distress, speaking full sentences ABD/GI: Normal bowel sounds; non-distended; soft, non-tender, no rebound, no guarding, no peritoneal signs BACK: The back appears normal EXT: Normal ROM in all joints; compartments are soft, signs of venous stasis dermatitis to bilateral lower extremities, 4+ pitting edema in bilateral lower extremities from the knee down.  Extremities are cool to touch but I am able to Doppler 2+ DP and PT pulses bilaterally.  Multiple areas of superficial ulcers and weeping clear to yellow fluid but no purulent drainage.  No warmth or fluctuance.  No crepitus.  Reports normal sensation in his legs. SKIN: Normal color for age and race; warm; no rash on exposed skin NEURO: Moves all extremities equally, normal speech PSYCH: The patient's mood and manner are appropriate.      Patient gave verbal permission to utilize photo for medical documentation only. The image was not stored on any personal device.   ED Results / Procedures / Treatments   LABS: (all labs ordered are listed, but only abnormal  results are displayed) Labs Reviewed  LACTIC ACID, PLASMA - Abnormal; Notable for the following components:      Result Value   Lactic Acid, Venous 2.5 (*)    All other components within normal limits  LACTIC ACID, PLASMA - Abnormal; Notable for the following components:   Lactic Acid, Venous 2.7 (*)    All other components within normal limits  COMPREHENSIVE METABOLIC PANEL - Abnormal; Notable  for the following components:   Sodium 134 (*)    Potassium 3.1 (*)    Chloride 96 (*)    Glucose, Bld 105 (*)    Creatinine, Ser 0.55 (*)    Calcium 7.2 (*)    Total Protein 5.4 (*)    Albumin 2.7 (*)    Total Bilirubin 5.0 (*)    All other components within normal limits  CBC WITH DIFFERENTIAL/PLATELET - Abnormal; Notable for the following components:   RBC 3.82 (*)    MCV 103.1 (*)    MCH 35.6 (*)    Platelets 48 (*)    Lymphs Abs 0.5 (*)    All other components within normal limits  RESP PANEL BY RT-PCR (FLU A&B, COVID) ARPGX2  CULTURE, BLOOD (ROUTINE X 2)  PROCALCITONIN  URINALYSIS, ROUTINE W REFLEX MICROSCOPIC  BRAIN NATRIURETIC PEPTIDE  TROPONIN I (HIGH SENSITIVITY)  TROPONIN I (HIGH SENSITIVITY)     EKG:  EKG Interpretation  Date/Time:  Thursday August 09 2021 13:58:58 EST Ventricular Rate:  107 PR Interval:  196 QRS Duration: 116 QT Interval:  396 QTC Calculation: 528 R Axis:   102 Text Interpretation: Sinus tachycardia with occasional Premature ventricular complexes Rightward axis Possible Inferior infarct , age undetermined ST & T wave abnormality, consider lateral ischemia Abnormal ECG When compared with ECG of 28-Jun-2021 08:26, Significant changes have occurred Confirmed by Pryor Curia [phone]) on 08/10/2021 1:21:10 AM         RADIOLOGY: My personal review and interpretation of imaging: Chest x-ray shows cardiomegaly but no convincing edema.  I have personally reviewed all radiology reports.   DG Chest 2 View  Result Date: 08/09/2021 CLINICAL DATA:  An  81 year old male presents with history of bilateral leg swelling and concern for infection. EXAM: CHEST - 2 VIEW COMPARISON:  June 28, 2021. FINDINGS: Post median sternotomy with similar appearance to prior imaging. Stable cardiomediastinal contours with mild cardiac enlargement and signs of aortic atherosclerosis and tortuosity. Patchy areas of predominantly linear airspace disease at the lung bases with similar appearance. No lobar consolidation. No sign of pleural effusion. On limited assessment there is no acute skeletal process. IMPRESSION: 1. Stable cardiomegaly with signs of aortic atherosclerosis and tortuosity. 2. Patchy areas of predominantly linear airspace disease at the lung bases with similar appearance to prior imaging. Findings may reflect atelectasis/scarring. Electronically Signed   By: Zetta Bills M.D.   On: 08/09/2021 14:28     PROCEDURES:  Critical Care performed: No    .1-3 Lead EKG Interpretation Performed by: Dollie Bressi, Delice Bison, DO Authorized by: Annahi Short, Delice Bison, DO     Interpretation: abnormal     ECG rate:  105   ECG rate assessment: tachycardic     Rhythm: sinus rhythm     Ectopy: PVCs     Conduction: normal      IMPRESSION / MDM / ASSESSMENT AND PLAN / ED COURSE  I reviewed the triage vital signs and the nursing notes.    Patient here with worsening lower extremity swelling over the past day.  Now unable to ambulate.  Intermittent shortness of breath.  No chest pain.  The patient is on the cardiac monitor to evaluate for evidence of arrhythmia and/or significant heart rate changes.   DIFFERENTIAL DIAGNOSIS (includes but not limited to):   CHF exacerbation, lymphedema, cellulitis, peripheral arterial disease, DVT, PE, ACS, COPD, pneumonia, UTI   PLAN: Cardiac labs including troponin x2, BNP, chest x-ray, EKG, IV diuresis, urine, venous Dopplers, admission.  MEDICATIONS GIVEN IN ED: Medications  furosemide (LASIX) injection 40 mg (40 mg  Intravenous Given 08/10/21 0217)     ED COURSE: Patient here with progressively worsening lower extremity swelling.  On exam his legs are significantly swollen and weeping bilaterally but they do not appear grossly infected.  I have a hard time palpating pulses but I am able to Doppler PT and DP pulses bilaterally.  He does not have any sign of abscess or compartment syndrome on exam.  Nothing that looks like cerulea dolens.  He states he has intermittent shortness of breath but his lungs are clear to auscultation and his chest x-ray reviewed by myself and radiology does not show any convincing edema, infiltrate or pneumothorax.  Will give IV diuretics here.  I feel he would likely need admission given he is unable to ambulate due to weakness and the swelling in his legs and he lives at home alone.  He will likely need placement to a rehab facility after this hospitalization.  Workup was initiated in triage and he has an elevated lactic.  I do not think this is from sepsis.  We will hold on IV fluids given I think he needs diuresis here as he is volume overloaded.  I do not think he needs broad-spectrum antibiotics at this time.  His procalcitonin is 0.22.  He has no fever.  Rectal temp of 97.  He has no leukocytosis or leukopenia.  Work-up   CONSULTS:  Consulted and discussed patient's case with hospitalist, Dr. Damita Dunnings.  I have recommended admission and consulting physician agrees and will place admission orders.  Patient (and family if present) agree with this plan.   I reviewed all nursing notes, vitals, pertinent previous records.  All labs, EKGs, imaging ordered have been independently reviewed and interpreted by myself.     OUTSIDE RECORDS REVIEWED: I have reviewed patient's previous admission on 06/28/2021 to 07/02/2021.  That time he was admitted to the hospital for bilateral lower extremity cellulitis with blisters.  Had no DVT seen on venous Doppler at that time.  He was being followed by the  lymphedema clinic.  He also generalized weakness and deconditioning with difficulty ambulating then as well and was discharged to a nursing facility.         FINAL CLINICAL IMPRESSION(S) / ED DIAGNOSES   Final diagnoses:  Peripheral edema  Generalized weakness  Leg swelling     Rx / DC Orders   ED Discharge Orders     None        Note:  This document was prepared using Dragon voice recognition software and may include unintentional dictation errors.   Dyesha Henault, Delice Bison, DO 08/10/21 [phone]

## 2021-08-10 NOTE — Progress Notes (Signed)
Admission skin assessment completed with Montine Circle, RN. Findings as follows:  Erythema/Moisture associated breakdown to abdominal fold  and groin.  Stage II Pressure Ulcer to sacrum measuring  1 x 1 x 0 cm. Reddened, non blanchable area.   Blister Right Great Toe 0.4 x 0.8 x 0 cm  Venous Stasis Ulcer to Right Dorsal Foot measuring 1 x 2 x 0 cm. Wound bed pink and yellow. No drainage noted.   Venous Stasis Ulcer to RLE cal area measuring 13 x 14 x 0.2 cm. Wound bed pink and yellow. Scant amount of serous drainage noted.   BLE skin very dry and scaly.

## 2021-08-10 NOTE — ED Notes (Signed)
Pt ate a few bites but stated he didn't feel hungry

## 2021-08-10 NOTE — Consult Note (Addendum)
[place] Nurse Consult Note: Consultation was completed by review of records, images and assistance from bedside RN. Known to our department from previous admissions  Reason for Consult:LE Wounds. Patient with lymphedema and blistering, small open wound on RLE at toe and foot related to this chronic disease process/. Area of partial thickness skin loss at right pretibial area Wound type:Skin changes related to lymphedema; ruptured blisters with weeping. Pressure Injury POA: N/A Measurement:Obtained by Bedside RN and on flow sheet Wound ZOX:WRUE, moist Drainage (amount, consistency, odor) serous Periwound: see above. Skin changes related to lymphedema Dressing procedure/placement/frequency: Topical care guidance is provided for Nursing to include cleansing with soap and water, followed by application of nonadherent antimicrobial dressing (xeroform gauze) covered with dry gauze and ABD pads. This is to be secured by wrapping from just below toes to just below knee with Kerlix roll gauze/paper tape (heel inclusive). The Kerlix roll gauze is to be topped with Coban wrapped in a similar manner.Apply today and change every M/W/F while in house. Float heels. Bedside RN to apply/change.  Recommend follow up in an outpatient lymphedema clinic of the patient's choosing; this could be arranged in [facility] outpatient Rehab.   Lymphedema  Resources (updated 06/2021) Each site requires a referral from your primary care MD [facility] [address], [place]  [phone] (Upper extremities)   [place], [place] [phone] (Lower extremities, PATIENT CAN NOT HAVE A WOUND)   [place] [address] [place], [place] [address] [phone] [facility] [place], [place] [address] [phone], [address] Medical Office Building [place], [place] [phone]  [facility] [address] [place], [place] [address] [phone]   [place] at [facility]  (only treatment for lymphedema related to cancer diagnosis) [place], [place] [address] [phone]     [facility] [place], [place] [address] [phone] [facility] [phone] [address] [place], [place] [address] [phone]   [place] nursing team will not follow, but will remain available to this patient, the nursing and medical teams.  Please re-consult if needed. Thanks, Maudie Flakes, MSN, RN, [place], Arther Abbott  Pager# [phone]

## 2021-08-10 NOTE — Progress Notes (Signed)
Patient arrived to unit in stable condition via bed from ER.

## 2021-08-10 NOTE — ED Notes (Signed)
Bilateral lower extremity wraps removed, right leg weeping yellow fluid.  Pt incontinent of urine, pt cleaned, brief placed and male purewick placed.  Pt has open sores to left fold between abdomen and left leg and sacrum, non-blanching redness noted on buttocks.

## 2021-08-10 NOTE — Progress Notes (Signed)
Earl Ramirez is a 81 y.o. 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 fib, cirrhosis with portal hypertension and chronic thrombocytopenia, chronic lymphedema/venous stasis on Unna boots who presents to the emergency room with right leg pain as well as increased lower extremity edema and heaviness bilateral legs.  He denies fever and chills.  Denies chest pain or shortness of breath.  Patient was admitted from 11/24 to 07/02/2021 with bilateral lower extremity cellulitis with blistering which responded to wound care and antibiotics and was discharged to the lymphedema clinic.   ED course: Mild hypothermia of 97 and tachycardic to 110s in the ED with otherwise normal vitals Blood work: WBC 6 with lactic acid 2.5-2.7 and procalcitonin 0.22 Potassium 3.1 Troponin negative x2 COVID and flu negative   EKG, personally viewed and interpreted: Sinus tachycardia at 107 with nonspecific ST-T wave changes   Imaging: Chest x-ray with stable cardiomegaly.  Linear airspace disease at lung bases reflecting atelectasis/scarring   Patient treated with IV Lasix x1.  Hospitalist consulted for admission.   08/10/2021: Patient was seen and examined at his bedside in the ED.  Reports right lower extremity tenderness with minimal palpation.  Left lower extremity cellulitis with erythema, warmth, erythema, tenderness.  On antibiotics empirically for cellulitis.  Please refer to H&P dictated by my partner Dr. Damita Dunnings on 08/10/2021 for further details of the assessment and plan.

## 2021-08-10 NOTE — ED Notes (Signed)
Updated pt daughter

## 2021-08-10 NOTE — H&P (Signed)
History and Physical    Earl Ramirez ZOX:[id number] DOB: [birth year] DOA: 08/10/2021  PCP: Baxter Hire, MD   Patient coming from: home  I have personally briefly reviewed patient's relevant medical records in [place]  Chief Complaint:  right leg pain  HPI: Earl Ramirez is a 81 y.o. 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 fib, cirrhosis with portal hypertension and chronic thrombocytopenia, chronic lymphedema/venous stasis on Unna boots who presents to the emergency room with right leg pain as well as increased lower extremity edema and heaviness bilateral legs.  He denies fever and chills.  Denies chest pain or shortness of breath.  Patient was admitted from 11/24 to 07/02/2021 with bilateral lower extremity cellulitis with blistering which responded to wound care and antibiotics and was discharged to the lymphedema clinic.  ED course: Mild hypothermia of 97 and tachycardic to 110s in the ED with otherwise normal vitals Blood work: WBC 6 with lactic acid 2.5-2.7 and procalcitonin 0.22 Potassium 3.1 Troponin negative x2 COVID and flu negative  EKG, personally viewed and interpreted: Sinus tachycardia at 107 with nonspecific ST-T wave changes  Imaging: Chest x-ray with stable cardiomegaly.  Linear airspace disease at lung bases reflecting atelectasis/scarring  Patient treated with IV Lasix x1.  Hospitalist consulted for admission.   Review of Systems: As per HPI otherwise all other systems on review of systems negative.   Assessment/Plan    Cellulitis right lower extremity   Lymphedema bilateral lower extremities - Rocephin and vancomycin - Keep legs elevated - Wound care  Generalized weakness and physical deconditioning - Likely related to cellulitis, lymphedema and comorbidities - Physical therapy evaluation  Paroxysmal atrial fibrillation (HCC) -  Continue metoprolol - Not on anticoagulation, suspect secondary to chronic thrombocytopenia  Liver cirrhosis with portal hypertension - Compensated  Chronic thrombocytopenia (HCC) - At baseline    Prostate cancer stage IV - On Xtandi  History of bladder outlet obstruction s/p TURP -Continue Flomax    DVT prophylaxis: SCDs as tolerated Code Status: full code  Family Communication:  none  Disposition Plan: Back to previous home environment Consults called: none  Status:At the time of admission, it appears that the appropriate admission status for this patient is INPATIENT. This is judged to be reasonable and necessary in order to provide the required intensity of service to ensure the patient's safety given the presenting symptoms, physical exam findings, and initial radiographic and laboratory data in the context of their  Comorbid conditions.   Patient requires inpatient status due to high intensity of service, high risk for further deterioration and high frequency of surveillance required.   I certify that at the point of admission it is my clinical judgment that the patient will require inpatient hospital care spanning beyond 2 midnights     Physical Exam: Vitals:   08/09/21 2139 08/09/21 2322 08/10/21 0148 08/10/21 0215  BP: 124/65 128/74  137/72  Pulse: (!) 108 (!) 110  (!) 102  Resp: 20 18  18   Temp:  97.6 F (36.4 C) (!) 97 F (36.1 C)   TempSrc: Oral Oral Rectal   SpO2: 100% 100%  100%  Weight:  102.1 kg    Height:  5\' 7"  (1.702 m)     Constitutional: Alert, oriented x 3 . Not in any apparent distress HEENT:      Head: Normocephalic and atraumatic.  Eyes: PERLA, EOMI, Conjunctivae are normal. Sclera is non-icteric.       Mouth/Throat: Mucous membranes are moist.       Neck: Supple with no signs of meningismus. Cardiovascular: Regular rate and rhythm. No murmurs, gallops, or rubs. 2+ symmetrical distal pulses are present . No JVD.  Hard nonpitting 3+ LE  edema Respiratory: Respiratory effort normal .Lungs sounds clear bilaterally. No wheezes, crackles, or rhonchi.  Gastrointestinal: Soft, non tender, non distended. Positive bowel sounds.  Genitourinary: No CVA tenderness. Musculoskeletal: Nontender with normal range of motion in all extremities   Neurologic:  Face is symmetric. Moving all extremities. No gross focal neurologic deficits . Skin: Redness and cracks dorsal aspect of right foot and posterior aspect of right lower leg Psychiatric: Mood and affect are appropriate     Past Medical History:  Diagnosis Date   Aortic valve replaced    1998   Arthritis    Atrial fibrillation (HCC)    COPD (chronic obstructive pulmonary disease) (HCC)    Dysrhythmia    atrial fib   Hernia of abdominal wall    Hypertension    Indigestion    Neuropathy    Neuropathy    Prostate cancer ([place])    Sleep apnea    Thrombocytopenia (HCC)    Tremors of nervous system    Urinary problem     Past Surgical History:  Procedure Laterality Date   CALDWELL LUC     tongue      growth removed   TRANSURETHRAL RESECTION OF PROSTATE N/A 01/18/2019   Procedure: CHANNEL TRANSURETHRAL RESECTION OF THE PROSTATE (TURP);  Surgeon: [name], MD;  Location: [facility] ORS;  Service: Urology;  Laterality: N/A;   VALVE REPLACEMENT       reports that he quit smoking about 20 years ago. His smoking use included cigarettes. He quit smokeless tobacco use about 20 years ago. He reports current alcohol use of about 10.0 standard drinks per week. He reports that he does not use drugs.  Allergies  Allergen Reactions   Oxycodone-Acetaminophen Nausea Only and Shortness Of Breath   Oxycodone-Acetaminophen     Other reaction(s): Drowsy   Amoxicillin Itching    Hand itching   Penicillins Rash    Did it involve swelling of the face/tongue/throat, SOB, or low BP? No Did it involve sudden or severe rash/hives, skin peeling, or any reaction on the inside of your mouth or  nose? No Did you need to seek medical attention at a hospital or doctor's office? No When did it last happen?      4-5 years ago If all above answers are NO, may proceed with cephalosporin use.     Family History  Problem Relation Age of Onset   Heart attack Father    Alcohol abuse Father    Congestive Heart Failure Mother    Alzheimer's disease Sister       Prior to Admission medications   Medication Sig Start Date End Date Taking? Authorizing Provider  ALPRAZolam (XANAX) 0.25 MG tablet Take 1 tablet (0.25 mg total) by mouth daily as needed for up to 4 doses for anxiety. Patient taking differently: Take 0.25 mg by mouth every 6 (six) hours as needed for anxiety. 07/02/21   Antonieta Pert, MD  aspirin EC 81 MG tablet Take 81 mg by mouth daily.     [provider]  calcium carbonate (TUMS - DOSED IN MG ELEMENTAL CALCIUM) 500 MG chewable tablet Chew 1 tablet by mouth daily.  [provider]  docusate sodium (COLACE) 100 MG capsule Take 100 mg by mouth daily as needed for mild constipation.    [provider]  enzalutamide Gillermina Phy) 40 MG tablet Take 4 tablets (160 mg total) by mouth daily. 08/08/21   Lloyd Huger, MD  furosemide (LASIX) 20 MG tablet Take 20 mg by mouth daily. 05/16/20   [provider]  gabapentin (NEURONTIN) 300 MG capsule Take 900 mg by mouth 4 (four) times daily.  09/22/19   [provider]  metoprolol succinate (TOPROL-XL) 25 MG 24 hr tablet Take 25 mg by mouth daily.  05/10/19   [provider]  mirabegron ER (MYRBETRIQ) 50 MG TB24 tablet Take 1 tablet (50 mg total) by mouth daily. Patient not taking: Reported on 07/19/2021 04/26/21   [name], MD  Multiple Vitamins-Minerals (MULTIVITAMIN WITH MINERALS) tablet Take 1 tablet by mouth daily.    [provider]  oxybutynin (DITROPAN-XL) 10 MG 24 hr tablet Take 10 mg by mouth daily.    [provider]  tamsulosin (FLOMAX) 0.4 MG CAPS  capsule Take 1 capsule by mouth twice daily Patient taking differently: Take 0.4 mg by mouth daily. 04/16/21   [name], MD  traMADol (ULTRAM) 50 MG tablet Take 50 mg by mouth every 6 (six) hours as needed for moderate pain.    [provider]      Labs on Admission: I have personally reviewed following labs and imaging studies  CBC: Recent Labs  Lab 08/09/21 1344  WBC 6.0  NEUTROABS 4.9  HGB 13.6  HCT 39.4  MCV 103.1*  PLT 48*   Basic Metabolic Panel: Recent Labs  Lab 08/09/21 1344  NA 134*  K 3.1*  CL 96*  CO2 27  GLUCOSE 105*  BUN 17  CREATININE 0.55*  CALCIUM 7.2*   GFR: Estimated Creatinine Clearance: 81.1 mL/min (A) (by C-G formula based on SCr of 0.55 mg/dL (L)). Liver Function Tests: Recent Labs  Lab 08/09/21 1344  AST 25  ALT 19  ALKPHOS 73  BILITOT 5.0*  PROT 5.4*  ALBUMIN 2.7*   No results for input(s): LIPASE, AMYLASE in the last 168 hours. No results for input(s): AMMONIA in the last 168 hours. Coagulation Profile: No results for input(s): INR, PROTIME in the last 168 hours. Cardiac Enzymes: No results for input(s): CKTOTAL, CKMB, CKMBINDEX, TROPONINI in the last 168 hours. BNP (last 3 results) No results for input(s): PROBNP in the last 8760 hours. HbA1C: No results for input(s): HGBA1C in the last 72 hours. CBG: No results for input(s): GLUCAP in the last 168 hours. Lipid Profile: No results for input(s): CHOL, HDL, LDLCALC, TRIG, CHOLHDL, LDLDIRECT in the last 72 hours. Thyroid Function Tests: No results for input(s): TSH, T4TOTAL, FREET4, T3FREE, THYROIDAB in the last 72 hours. Anemia Panel: No results for input(s): VITAMINB12, FOLATE, FERRITIN, TIBC, IRON, RETICCTPCT in the last 72 hours. Urine analysis:    Component Value Date/Time   COLORURINE YELLOW 06/28/2021 1440   APPEARANCEUR CLEAR (A) 06/28/2021 1440   APPEARANCEUR Cloudy (A) 05/22/2021 1348   LABSPEC 1.025 06/28/2021 1440   PHURINE 5.5 06/28/2021 1440    GLUCOSEU NEGATIVE 06/28/2021 1440   HGBUR NEGATIVE 06/28/2021 1440   BILIRUBINUR SMALL (A) 06/28/2021 1440   BILIRUBINUR Negative 05/22/2021 1348   KETONESUR NEGATIVE 06/28/2021 1440   PROTEINUR 30 (A) 06/28/2021 1440   NITRITE NEGATIVE 06/28/2021 1440   LEUKOCYTESUR NEGATIVE 06/28/2021 1440    Radiological Exams on Admission: DG Chest 2 View  Result Date: 08/09/2021 CLINICAL DATA:  An 81 year old male presents with history of bilateral leg swelling and concern for infection. EXAM: CHEST - 2 VIEW COMPARISON:  June 28, 2021. FINDINGS: Post median sternotomy with similar appearance to prior imaging. Stable cardiomediastinal contours with mild cardiac enlargement and signs of aortic atherosclerosis and tortuosity. Patchy areas of predominantly linear airspace disease at the lung bases with similar appearance. No lobar consolidation. No sign of pleural effusion. On limited assessment there is no acute skeletal process. IMPRESSION: 1. Stable cardiomegaly with signs of aortic atherosclerosis and tortuosity. 2. Patchy areas of predominantly linear airspace disease at the lung bases with similar appearance to prior imaging. Findings may reflect atelectasis/scarring. Electronically Signed   By: Zetta Bills M.D.   On: 08/09/2021 14:28       Athena Masse MD [facility]   08/10/2021, 2:52 AM

## 2021-08-11 DIAGNOSIS — L03115 Cellulitis of right lower limb: Secondary | ICD-10-CM | POA: Diagnosis not present

## 2021-08-11 LAB — COMPREHENSIVE METABOLIC PANEL
ALT: 16 U/L (ref 0–44)
AST: 27 U/L (ref 15–41)
Albumin: 2.5 g/dL — ABNORMAL LOW (ref 3.5–5.0)
Alkaline Phosphatase: 67 U/L (ref 38–126)
Anion gap: 10 (ref 5–15)
BUN: 18 mg/dL (ref 8–23)
CO2: 25 mmol/L (ref 22–32)
Calcium: 7 mg/dL — ABNORMAL LOW (ref 8.9–10.3)
Chloride: 95 mmol/L — ABNORMAL LOW (ref 98–111)
Creatinine, Ser: 0.5 mg/dL — ABNORMAL LOW (ref 0.61–1.24)
GFR, Estimated: >60 mL/min (ref >60)
Glucose, Bld: 81 mg/dL (ref 70–99)
Potassium: 3.5 mmol/L (ref 3.5–5.1)
Sodium: 130 mmol/L — ABNORMAL LOW (ref 135–145)
Total Bilirubin: 4 mg/dL — ABNORMAL HIGH (ref 0.3–1.2)
Total Protein: 4.9 g/dL — ABNORMAL LOW (ref 6.5–8.1)

## 2021-08-11 LAB — CBC
HCT: 37.5 % — ABNORMAL LOW (ref 39.0–52.0)
Hemoglobin: 13.2 g/dL (ref 13.0–17.0)
MCH: 35.9 pg — ABNORMAL HIGH (ref 26.0–34.0)
MCHC: 35.2 g/dL (ref 30.0–36.0)
MCV: 101.9 fL — ABNORMAL HIGH (ref 80.0–100.0)
Platelets: 55 10*3/uL — ABNORMAL LOW (ref 150–400)
RBC: 3.68 MIL/uL — ABNORMAL LOW (ref 4.22–5.81)
RDW: 15.4 % (ref 11.5–15.5)
WBC: 6 10*3/uL (ref 4.0–10.5)
nRBC: 0 % (ref 0.0–0.2)

## 2021-08-11 LAB — MAGNESIUM: Magnesium: 1.9 mg/dL (ref 1.7–2.4)

## 2021-08-11 LAB — PHOSPHORUS: Phosphorus: 2.3 mg/dL — ABNORMAL LOW (ref 2.5–4.6)

## 2021-08-11 MED ORDER — ENZALUTAMIDE 40 MG PO TABS: 160.0000 mg | ORAL_TABLET | Freq: Every day | ORAL | Status: DC

## 2021-08-11 MED ORDER — FUROSEMIDE 20 MG PO TABS
20.0000 mg | ORAL_TABLET | Freq: Every day | ORAL | Status: DC
  Administered 2021-08-11: 18:00:00 20 mg via ORAL
  Filled 2021-08-11 (×2): qty 1

## 2021-08-11 MED ORDER — TAMSULOSIN HCL 0.4 MG PO CAPS
0.4000 mg | ORAL_CAPSULE | Freq: Every day | ORAL | Status: DC
  Administered 2021-08-11 – 2021-08-17 (×7): 0.4 mg via ORAL
  Filled 2021-08-11 (×7): qty 1

## 2021-08-11 MED ORDER — ALPRAZOLAM 0.25 MG PO TABS
0.2500 mg | ORAL_TABLET | Freq: Every day | ORAL | Status: DC | PRN
  Administered 2021-08-13 – 2021-08-16 (×3): 0.25 mg via ORAL
  Filled 2021-08-11 (×4): qty 1

## 2021-08-11 MED ORDER — GABAPENTIN 300 MG PO CAPS
400.0000 mg | ORAL_CAPSULE | Freq: Four times a day (QID) | ORAL | Status: DC
  Administered 2021-08-11 – 2021-08-17 (×22): 400 mg via ORAL
  Filled 2021-08-11 (×22): qty 1

## 2021-08-11 NOTE — Progress Notes (Signed)
PROGRESS NOTE  Earl Ramirez HDQ:[id number] DOB: 1940/11/12 DOA: 08/10/2021 PCP: Baxter Hire, MD  HPI/Recap of past 24 hours: Earl Ramirez is a 81 y.o. 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 fib not on anticoagulation, cirrhosis with portal hypertension and chronic thrombocytopenia, chronic lymphedema/venous stasis on Unna boots who presents to the emergency room with right leg pain as well as increased lower extremity edema and heaviness bilateral legs.  He denies fever and chills.  Denies chest pain or shortness of breath.  Patient was admitted from 11/24 to 07/02/2021 with bilateral lower extremity cellulitis with blistering which responded to wound care and antibiotics and was discharged to the lymphedema clinic.  Admitted for right lower extremity cellulitis.  Started on Rocephin and IV vancomycin.    08/11/2021: Patient was seen and examined at bedside.  Reports pain in his right toes from his wrappings.     Assessment/Plan: Principal Problem:   Cellulitis Active Problems:   Thrombocytopenia ([place])   Prostate cancer ([place])   COPD (chronic obstructive pulmonary disease) (HCC)   Sleep apnea   Lymphedema   Atrial fibrillation (HCC)   Liver cirrhosis (HCC)   Portal hypertension (HCC)  Cellulitis right lower extremity   Lymphedema bilateral lower extremities - Continue Rocephin and vancomycin - Continue local wound care with wound care specialist guidance. -Continue IV antibiotics. Monitor fever curve and WBC.  Generalized weakness and physical deconditioning - Likely related to cellulitis, lymphedema and comorbidities - Physical therapy evaluation Fall precautions   Paroxysmal atrial fibrillation (HCC) normal oral anticoagulation - Continue metoprolol for rate control - Not on anticoagulation, suspect secondary to chronic thrombocytopenia, liver cirrhosis with  portal hypertension.  Liver cirrhosis with portal hypertension - Compensated   Chronic thrombocytopenia (HCC) - At baseline Continue to monitor     Prostate cancer stage IV - On Xtandi   History of bladder outlet obstruction s/p TURP - Continue home Flomax Monitor urine output     DVT prophylaxis: SCDs as tolerated Code Status: full code  Family Communication:  none  Disposition Plan: Back to previous home environment Consults called: Wound care specialist.     Status is: Inpatient  Patient requires at least 2 midnights for further evaluation and treatment of present condition.      Objective: Vitals:   08/10/21 2054 08/11/21 0507 08/11/21 0755 08/11/21 1204  BP: 127/70 135/78 128/72 129/65  Pulse: (!) 101 (!) 104 (!) 102 (!) 105  Resp: 18 18 15 14   Temp: 97.6 F (36.4 C) 97.7 F (36.5 C) (!) 97.4 F (36.3 C) 97.7 F (36.5 C)  TempSrc: Oral Oral  Oral  SpO2: 99% 98% 99% 97%  Weight:      Height:        Intake/Output Summary (Last 24 hours) at 08/11/2021 1409 Last data filed at 08/11/2021 1400 Gross per 24 hour  Intake 623.14 ml  Output --  Net 623.14 ml   Filed Weights   08/09/21 1342 08/09/21 2322  Weight: 107 kg 102.1 kg    Exam:  General: 81 y.o. year-old male well developed well nourished in no acute distress.  Alert and oriented x3. Cardiovascular: Regular rate and rhythm with no rubs or gallops.  No thyromegaly or JVD noted.   Respiratory: Clear to auscultation with no wheezes or rales. Good inspiratory effort. Abdomen: Soft nontender nondistended with normal bowel sounds x4 quadrants. Musculoskeletal: 2+ pitting edema in  lower extremities bilaterally.  Skin: Lower extremities, lymphedema, right lower extremity cellulitis, erythema, warmth, edema. Psychiatry: Mood is appropriate for condition and setting   Data Reviewed: CBC: Recent Labs  Lab 08/09/21 1344 08/11/21 0624  WBC 6.0 6.0  NEUTROABS 4.9  --   HGB 13.6 13.2  HCT 39.4 37.5*   MCV 103.1* 101.9*  PLT 48* 55*   Basic Metabolic Panel: Recent Labs  Lab 08/09/21 1344 08/11/21 0624  NA 134* 130*  K 3.1* 3.5  CL 96* 95*  CO2 27 25  GLUCOSE 105* 81  BUN 17 18  CREATININE 0.55* 0.50*  CALCIUM 7.2* 7.0*  MG  --  1.9  PHOS  --  2.3*   GFR: Estimated Creatinine Clearance: 81.1 mL/min (A) (by C-G formula based on SCr of 0.5 mg/dL (L)). Liver Function Tests: Recent Labs  Lab 08/09/21 1344 08/11/21 0624  AST 25 27  ALT 19 16  ALKPHOS 73 67  BILITOT 5.0* 4.0*  PROT 5.4* 4.9*  ALBUMIN 2.7* 2.5*   No results for input(s): LIPASE, AMYLASE in the last 168 hours. No results for input(s): AMMONIA in the last 168 hours. Coagulation Profile: No results for input(s): INR, PROTIME in the last 168 hours. Cardiac Enzymes: No results for input(s): CKTOTAL, CKMB, CKMBINDEX, TROPONINI in the last 168 hours. BNP (last 3 results) No results for input(s): PROBNP in the last 8760 hours. HbA1C: No results for input(s): HGBA1C in the last 72 hours. CBG: No results for input(s): GLUCAP in the last 168 hours. Lipid Profile: No results for input(s): CHOL, HDL, LDLCALC, TRIG, CHOLHDL, LDLDIRECT in the last 72 hours. Thyroid Function Tests: No results for input(s): TSH, T4TOTAL, FREET4, T3FREE, THYROIDAB in the last 72 hours. Anemia Panel: No results for input(s): VITAMINB12, FOLATE, FERRITIN, TIBC, IRON, RETICCTPCT in the last 72 hours. Urine analysis:    Component Value Date/Time   COLORURINE YELLOW 06/28/2021 1440   APPEARANCEUR CLEAR (A) 06/28/2021 1440   APPEARANCEUR Cloudy (A) 05/22/2021 1348   LABSPEC 1.025 06/28/2021 1440   PHURINE 5.5 06/28/2021 1440   GLUCOSEU NEGATIVE 06/28/2021 1440   HGBUR NEGATIVE 06/28/2021 1440   BILIRUBINUR SMALL (A) 06/28/2021 1440   BILIRUBINUR Negative 05/22/2021 1348   KETONESUR NEGATIVE 06/28/2021 1440   PROTEINUR 30 (A) 06/28/2021 1440   NITRITE NEGATIVE 06/28/2021 1440   LEUKOCYTESUR NEGATIVE 06/28/2021 1440   Sepsis  Labs: @LABRCNTIP (procalcitonin:4,lacticidven:4)  ) Recent Results (from the past 240 hour(s))  Resp Panel by RT-PCR (Flu A&B, Covid) Nasopharyngeal Swab     Status: None   Collection Time: 08/09/21 11:15 PM   Specimen: Nasopharyngeal Swab; Nasopharyngeal(NP) swabs in vial transport medium  Result Value Ref Range Status   SARS Coronavirus 2 by RT PCR NEGATIVE NEGATIVE Final    Comment: (NOTE) SARS-CoV-2 target nucleic acids are NOT DETECTED.  The SARS-CoV-2 RNA is generally detectable in upper respiratory specimens during the acute phase of infection. The lowest concentration of SARS-CoV-2 viral copies this assay can detect is 138 copies/mL. A negative result does not preclude SARS-Cov-2 infection and should not be used as the sole basis for treatment or other patient management decisions. A negative result may occur with  improper specimen collection/handling, submission of specimen other than nasopharyngeal swab, presence of viral mutation(s) within the areas targeted by this assay, and inadequate number of viral copies(<138 copies/mL). A negative result must be combined with clinical observations, patient history, and epidemiological information. The expected result is Negative.  Fact Sheet for Patients:  EntrepreneurPulse.com.au  Fact Sheet for  Healthcare Providers:  IncredibleEmployment.be  This test is no t yet approved or cleared by the [place] and  has been authorized for detection and/or diagnosis of SARS-CoV-2 by FDA under an Emergency Use Authorization (EUA). This EUA will remain  in effect (meaning this test can be used) for the duration of the COVID-19 declaration under Section 564(b)(1) of the Act, 21 U.S.C.section 360bbb-3(b)(1), unless the authorization is terminated  or revoked sooner.       Influenza A by PCR NEGATIVE NEGATIVE Final   Influenza B by PCR NEGATIVE NEGATIVE Final    Comment: (NOTE) The Xpert  Xpress SARS-CoV-2/FLU/RSV plus assay is intended as an aid in the diagnosis of influenza from Nasopharyngeal swab specimens and should not be used as a sole basis for treatment. Nasal washings and aspirates are unacceptable for Xpert Xpress SARS-CoV-2/FLU/RSV testing.  Fact Sheet for Patients: EntrepreneurPulse.com.au  Fact Sheet for Healthcare Providers: IncredibleEmployment.be  This test is not yet approved or cleared by the [place] FDA and has been authorized for detection and/or diagnosis of SARS-CoV-2 by FDA under an Emergency Use Authorization (EUA). This EUA will remain in effect (meaning this test can be used) for the duration of the COVID-19 declaration under Section 564(b)(1) of the Act, 21 U.S.C. section 360bbb-3(b)(1), unless the authorization is terminated or revoked.  Performed at [facility], [address] [place], [place] [address]   Blood culture (routine x 2)     Status: None (Preliminary result)   Collection Time: 08/09/21 11:15 PM   Specimen: BLOOD  Result Value Ref Range Status   Specimen Description BLOOD RIGHT HAND  Final   Special Requests   Final    BOTTLES DRAWN AEROBIC AND ANAEROBIC Blood Culture adequate volume   Culture   Final    NO GROWTH 1 DAY Performed at [facility], [address], [place], [place] [address]    Report Status PENDING  Incomplete      Studies: No results found.  Scheduled Meds:  Continuous Infusions:  cefTRIAXone (ROCEPHIN)  IV Stopped (08/11/21 0558)     LOS: 1 day     Kayleen Memos, MD [facility] Pager [phone]  If 7PM-7AM, please contact night-coverage [email or web address] Password [email or web address] 08/11/2021, 2:09 PM

## 2021-08-12 DIAGNOSIS — L03115 Cellulitis of right lower limb: Secondary | ICD-10-CM | POA: Diagnosis not present

## 2021-08-12 LAB — COMPREHENSIVE METABOLIC PANEL
ALT: 14 U/L (ref 0–44)
AST: 19 U/L (ref 15–41)
Albumin: 2.3 g/dL — ABNORMAL LOW (ref 3.5–5.0)
Alkaline Phosphatase: 66 U/L (ref 38–126)
Anion gap: 8 (ref 5–15)
BUN: 20 mg/dL (ref 8–23)
CO2: 28 mmol/L (ref 22–32)
Calcium: 6.9 mg/dL — ABNORMAL LOW (ref 8.9–10.3)
Chloride: 97 mmol/L — ABNORMAL LOW (ref 98–111)
Creatinine, Ser: 0.46 mg/dL — ABNORMAL LOW (ref 0.61–1.24)
GFR, Estimated: >60 mL/min (ref >60)
Glucose, Bld: 107 mg/dL — ABNORMAL HIGH (ref 70–99)
Potassium: 3.5 mmol/L (ref 3.5–5.1)
Sodium: 133 mmol/L — ABNORMAL LOW (ref 135–145)
Total Bilirubin: 2.7 mg/dL — ABNORMAL HIGH (ref 0.3–1.2)
Total Protein: 4.6 g/dL — ABNORMAL LOW (ref 6.5–8.1)

## 2021-08-12 LAB — CBC
HCT: 37 % — ABNORMAL LOW (ref 39.0–52.0)
Hemoglobin: 12.6 g/dL — ABNORMAL LOW (ref 13.0–17.0)
MCH: 35.1 pg — ABNORMAL HIGH (ref 26.0–34.0)
MCHC: 34.1 g/dL (ref 30.0–36.0)
MCV: 103.1 fL — ABNORMAL HIGH (ref 80.0–100.0)
Platelets: 48 10*3/uL — ABNORMAL LOW (ref 150–400)
RBC: 3.59 MIL/uL — ABNORMAL LOW (ref 4.22–5.81)
RDW: 15.7 % — ABNORMAL HIGH (ref 11.5–15.5)
WBC: 5.1 10*3/uL (ref 4.0–10.5)
nRBC: 0 % (ref 0.0–0.2)

## 2021-08-12 LAB — PHOSPHORUS: Phosphorus: 2.4 mg/dL — ABNORMAL LOW (ref 2.5–4.6)

## 2021-08-12 LAB — MAGNESIUM: Magnesium: 2.2 mg/dL (ref 1.7–2.4)

## 2021-08-12 MED ORDER — METOPROLOL SUCCINATE ER 25 MG PO TB24
25.0000 mg | ORAL_TABLET | Freq: Every day | ORAL | Status: DC
  Administered 2021-08-12 – 2021-08-17 (×5): 25 mg via ORAL
  Filled 2021-08-12 (×5): qty 1

## 2021-08-12 MED ORDER — FUROSEMIDE 40 MG PO TABS
40.0000 mg | ORAL_TABLET | Freq: Every day | ORAL | Status: DC
  Administered 2021-08-13 – 2021-08-17 (×4): 40 mg via ORAL
  Filled 2021-08-12 (×4): qty 1

## 2021-08-12 MED ORDER — FUROSEMIDE 10 MG/ML IJ SOLN
40.0000 mg | Freq: Once | INTRAMUSCULAR | Status: AC
  Administered 2021-08-12: 40 mg via INTRAVENOUS
  Filled 2021-08-12: qty 4

## 2021-08-12 MED ORDER — K PHOS MONO-SOD PHOS DI & MONO 155-852-130 MG PO TABS
500.0000 mg | ORAL_TABLET | Freq: Three times a day (TID) | ORAL | Status: AC
  Administered 2021-08-12: 10:00:00 500 mg via ORAL
  Filled 2021-08-12 (×4): qty 2

## 2021-08-12 NOTE — Progress Notes (Signed)
PT Cancellation Note  Patient Details Name: Earl Ramirez MRN: [id number] DOB: November 16, 1940   Cancelled Treatment:    Reason Eval/Treat Not Completed: Pain limiting ability to participate.  Having pain and meds are not due yet.  Follow up as time and pt allow.   Ramond Dial 08/12/2021, 2:27 PM  Mee Hives, PT PhD Acute Rehab Dept. Number: [place] and [place]

## 2021-08-12 NOTE — Progress Notes (Signed)
PROGRESS NOTE    Earl Ramirez  QQI:[id number] DOB: 01/19/1941 DOA: 08/10/2021 PCP: Baxter Hire, MD   Chief Complain: Edema of lower extremities  Brief Narrative: Patient is 81 year old male with history of stage IV prostate cancer on Xtandi, bladder outlet obstruction status post TURP, status post aortic valve replacement, A. 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  No report of fever or chills.  Patient was admitted for the management of right lower extremity cellulitis.  Started on antibiotics.  Assessment & Plan:   Principal Problem:   Cellulitis Active Problems:   Thrombocytopenia (HCC)   Prostate cancer (HCC)   COPD (chronic obstructive pulmonary disease) (HCC)   Sleep apnea   Lymphedema   Atrial fibrillation (HCC)   Liver cirrhosis (HCC)   Portal hypertension (HCC)   Right lower extremity cellulitis: Initially started on vancomycin and ceftriaxone.  Currently on ceftriaxone only.  Wound care following.  Afebrile.  Blood cultures negative.  Venous Doppler was negative for DVT. Wound care recommended outpatient follow-up with lymphedema clinic.  Paroxysmal A. fib: Currently in normal sinus rhythm.  Monitor on telemetry.  has history of chronic thrombocytopenia, on metoprolol for rate control  History of liver cirrhosis with portal hypertension: Currently stable.  Chronic thombocytopenia: Secondary to portal hypertension/liver cirrhosis.  Currently at baseline  Elevated BNP/volume overload: Takes Lasix 20 mg daily at home.  Does not have any record of echo on file.  Has bilateral lower EXTR edema more on the right.  We will give 1 dose of Lasix 40 mg IV once  Hypophosphatemia:Supplemented with phosphorus  Hyponatremia: Mild.  Most likely from volume overload.  History of prostate  cancer, stage IV: On Xtandi.  On Flomax for bladder outlet obstruction.  He is status post TURP  Debility/deconditioning: Patient states he does not have any family around.  Lives alone.  Very weak, deconditioned, unable to ambulate.  Requesting PT/OT evaluation.  Patient is interested in ALF more than skilled nursing facility           DVT prophylaxis:SCD Code Status: DNR Family Communication: None at bedside Patient status:Inpatient  Dispo: The patient is from: home              Anticipated d/c is to: home              Anticipated d/c date is: ALF vs SNF  Consultants: None  Procedures:None  Antimicrobials:  Anti-infectives (From admission, onward)    Start     Dose/Rate Route Frequency Ordered Stop   08/10/21 0400  cefTRIAXone (ROCEPHIN) 1 g in sodium chloride 0.9 % 100 mL IVPB        1 g 200 mL/hr over 30 Minutes Intravenous Every 24 hours 08/10/21 0305 08/17/21 0359       Subjective:  Patient seen and examined at the bedside this morning.  Hemodynamically stable.  Lying in bed, alert and oriented.  Complains of severe weakness, and unable to ambulate.  Right lower extremity still appears edematous, wrapped in dressing   Objective: Vitals:   08/11/21 1536 08/11/21 2251 08/12/21 0513 08/12/21 0819  BP: 117/71 134/65 117/63 (!) 107/58  Pulse: (!) 105 (!) 106 (!) 104 (!) 107  Resp: 16 14 17 18   Temp: 97.7 F (36.5 C) 97.8 F (36.6 C) (!) 97.5 F (36.4 C) (!) 97.4 F (36.3 C)  TempSrc:  Oral Oral Oral Oral  SpO2: 98% 96% 95% 95%  Weight:      Height:        Intake/Output Summary (Last 24 hours) at 08/12/2021 0827 Last data filed at 08/12/2021 0513 Gross per 24 hour  Intake 383.44 ml  Output 400 ml  Net -16.56 ml   Filed Weights   08/09/21 1342 08/09/21 2322  Weight: 107 kg 102.1 kg    Examination:  General exam: Overall comfortable, not in distress, deconditioned HEENT: PERRL Respiratory system:  no wheezes or crackles  Cardiovascular system: S1 &  S2 heard, RRR.  Gastrointestinal system: Abdomen is nondistended, soft and nontender. Central nervous system: Alert and oriented Extremities: Bilateral lower extremity lymphedema, edema of right lower extremity, no clubbing ,no cyanosis Skin: No rashes,no icterus      Data Reviewed: I have personally reviewed following labs and imaging studies  CBC: Recent Labs  Lab 08/09/21 1344 08/11/21 0624 08/12/21 0426  WBC 6.0 6.0 5.1  NEUTROABS 4.9  --   --   HGB 13.6 13.2 12.6*  HCT 39.4 37.5* 37.0*  MCV 103.1* 101.9* 103.1*  PLT 48* 55* 48*   Basic Metabolic Panel: Recent Labs  Lab 08/09/21 1344 08/11/21 0624 08/12/21 0426  NA 134* 130* 133*  K 3.1* 3.5 3.5  CL 96* 95* 97*  CO2 27 25 28   GLUCOSE 105* 81 107*  BUN 17 18 20   CREATININE 0.55* 0.50* 0.46*  CALCIUM 7.2* 7.0* 6.9*  MG  --  1.9 2.2  PHOS  --  2.3* 2.4*   GFR: Estimated Creatinine Clearance: 81.1 mL/min (A) (by C-G formula based on SCr of 0.46 mg/dL (L)). Liver Function Tests: Recent Labs  Lab 08/09/21 1344 08/11/21 0624 08/12/21 0426  AST 25 27 19   ALT 19 16 14   ALKPHOS 73 67 66  BILITOT 5.0* 4.0* 2.7*  PROT 5.4* 4.9* 4.6*  ALBUMIN 2.7* 2.5* 2.3*   No results for input(s): LIPASE, AMYLASE in the last 168 hours. No results for input(s): AMMONIA in the last 168 hours. Coagulation Profile: No results for input(s): INR, PROTIME in the last 168 hours. Cardiac Enzymes: No results for input(s): CKTOTAL, CKMB, CKMBINDEX, TROPONINI in the last 168 hours. BNP (last 3 results) No results for input(s): PROBNP in the last 8760 hours. HbA1C: No results for input(s): HGBA1C in the last 72 hours. CBG: No results for input(s): GLUCAP in the last 168 hours. Lipid Profile: No results for input(s): CHOL, HDL, LDLCALC, TRIG, CHOLHDL, LDLDIRECT in the last 72 hours. Thyroid Function Tests: No results for input(s): TSH, T4TOTAL, FREET4, T3FREE, THYROIDAB in the last 72 hours. Anemia Panel: No results for input(s):  VITAMINB12, FOLATE, FERRITIN, TIBC, IRON, RETICCTPCT in the last 72 hours. Sepsis Labs: Recent Labs  Lab 08/09/21 1344 08/09/21 2315  PROCALCITON  --  0.22  LATICACIDVEN 2.5* 2.7*    Recent Results (from the past 240 hour(s))  Resp Panel by RT-PCR (Flu A&B, Covid) Nasopharyngeal Swab     Status: None   Collection Time: 08/09/21 11:15 PM   Specimen: Nasopharyngeal Swab; Nasopharyngeal(NP) swabs in vial transport medium  Result Value Ref Range Status   SARS Coronavirus 2 by RT PCR NEGATIVE NEGATIVE Final    Comment: (NOTE) SARS-CoV-2 target nucleic acids are NOT DETECTED.  The SARS-CoV-2 RNA is generally detectable in upper respiratory specimens during the acute phase of infection. The lowest concentration of SARS-CoV-2 viral copies this assay can detect is 138 copies/mL. A negative result does not preclude SARS-Cov-2 infection and  should not be used as the sole basis for treatment or other patient management decisions. A negative result may occur with  improper specimen collection/handling, submission of specimen other than nasopharyngeal swab, presence of viral mutation(s) within the areas targeted by this assay, and inadequate number of viral copies(<138 copies/mL). A negative result must be combined with clinical observations, patient history, and epidemiological information. The expected result is Negative.  Fact Sheet for Patients:  EntrepreneurPulse.com.au  Fact Sheet for Healthcare Providers:  IncredibleEmployment.be  This test is no t yet approved or cleared by the [place] FDA and  has been authorized for detection and/or diagnosis of SARS-CoV-2 by FDA under an Emergency Use Authorization (EUA). This EUA will remain  in effect (meaning this test can be used) for the duration of the COVID-19 declaration under Section 564(b)(1) of the Act, 21 U.S.C.section 360bbb-3(b)(1), unless the authorization is terminated  or revoked  sooner.       Influenza A by PCR NEGATIVE NEGATIVE Final   Influenza B by PCR NEGATIVE NEGATIVE Final    Comment: (NOTE) The Xpert Xpress SARS-CoV-2/FLU/RSV plus assay is intended as an aid in the diagnosis of influenza from Nasopharyngeal swab specimens and should not be used as a sole basis for treatment. Nasal washings and aspirates are unacceptable for Xpert Xpress SARS-CoV-2/FLU/RSV testing.  Fact Sheet for Patients: EntrepreneurPulse.com.au  Fact Sheet for Healthcare Providers: IncredibleEmployment.be  This test is not yet approved or cleared by the [place] FDA and has been authorized for detection and/or diagnosis of SARS-CoV-2 by FDA under an Emergency Use Authorization (EUA). This EUA will remain in effect (meaning this test can be used) for the duration of the COVID-19 declaration under Section 564(b)(1) of the Act, 21 U.S.C. section 360bbb-3(b)(1), unless the authorization is terminated or revoked.  Performed at [facility], [address] [place], [place] [address]   Blood culture (routine x 2)     Status: None (Preliminary result)   Collection Time: 08/09/21 11:15 PM   Specimen: BLOOD  Result Value Ref Range Status   Specimen Description BLOOD RIGHT HAND  Final   Special Requests   Final    BOTTLES DRAWN AEROBIC AND ANAEROBIC Blood Culture adequate volume   Culture   Final    NO GROWTH 2 DAYS Performed at [facility], [address] [place], [place] [address]    Report Status PENDING  Incomplete         Radiology Studies: No results found.      Scheduled Meds:  enzalutamide  160 mg Oral Daily   furosemide  20 mg Oral Daily   gabapentin  400 mg Oral QID   phosphorus  500 mg Oral TID   tamsulosin  0.4 mg Oral Daily   Continuous Infusions:  cefTRIAXone (ROCEPHIN)  IV Stopped (08/12/21 0459)     LOS: 2 days    Time spent: 35 mins.More than 50% of that time was spent in  counseling and/or coordination of care.      Shelly Coss, MD [facility] P1/03/2022, 8:27 AM

## 2021-08-12 NOTE — Progress Notes (Signed)
OT Cancellation Note  Patient Details Name: Earl Ramirez MRN: [id number] DOB: [birth year]   Cancelled Treatment:    Reason Eval/Treat Not Completed: Patient declined, no reason specified. Order received and chart reviewed. Upon arrival to room pt awake and sitting upright in bed. Pt declining OT evaluation this date d/t pain with LE movement. Pt also stating "I am not interested in therapy, I want hospice services". MD informed via secure chat and requesting OT to re-attempt at later date. OT to re-attempt at later time/date as able.   Fredirick Maudlin, OTR/L [place]

## 2021-08-13 ENCOUNTER — Encounter: Payer: Self-pay | Admitting: Oncology

## 2021-08-13 ENCOUNTER — Other Ambulatory Visit (HOSPITAL_COMMUNITY): Payer: Self-pay

## 2021-08-13 DIAGNOSIS — L03115 Cellulitis of right lower limb: Secondary | ICD-10-CM | POA: Diagnosis not present

## 2021-08-13 LAB — BASIC METABOLIC PANEL
Anion gap: 10 (ref 5–15)
BUN: 24 mg/dL — ABNORMAL HIGH (ref 8–23)
CO2: 28 mmol/L (ref 22–32)
Calcium: 7 mg/dL — ABNORMAL LOW (ref 8.9–10.3)
Chloride: 95 mmol/L — ABNORMAL LOW (ref 98–111)
Creatinine, Ser: 0.49 mg/dL — ABNORMAL LOW (ref 0.61–1.24)
GFR, Estimated: >60 mL/min (ref >60)
Glucose, Bld: 108 mg/dL — ABNORMAL HIGH (ref 70–99)
Potassium: 3.8 mmol/L (ref 3.5–5.1)
Sodium: 133 mmol/L — ABNORMAL LOW (ref 135–145)

## 2021-08-13 LAB — CBC WITH DIFFERENTIAL/PLATELET
Abs Immature Granulocytes: 0.09 10*3/uL — ABNORMAL HIGH (ref 0.00–0.07)
Basophils Absolute: 0 10*3/uL (ref 0.0–0.1)
Basophils Relative: 0 %
Eosinophils Absolute: 0.1 10*3/uL (ref 0.0–0.5)
Eosinophils Relative: 1 %
HCT: 39.8 % (ref 39.0–52.0)
Hemoglobin: 13.6 g/dL (ref 13.0–17.0)
Immature Granulocytes: 1 %
Lymphocytes Relative: 9 %
Lymphs Abs: 0.8 10*3/uL (ref 0.7–4.0)
MCH: 34.7 pg — ABNORMAL HIGH (ref 26.0–34.0)
MCHC: 34.2 g/dL (ref 30.0–36.0)
MCV: 101.5 fL — ABNORMAL HIGH (ref 80.0–100.0)
Monocytes Absolute: 0.7 10*3/uL (ref 0.1–1.0)
Monocytes Relative: 9 %
Neutro Abs: 6.5 10*3/uL (ref 1.7–7.7)
Neutrophils Relative %: 80 %
Platelets: 92 10*3/uL — ABNORMAL LOW (ref 150–400)
RBC: 3.92 MIL/uL — ABNORMAL LOW (ref 4.22–5.81)
RDW: 15.5 % (ref 11.5–15.5)
WBC: 8.1 10*3/uL (ref 4.0–10.5)
nRBC: 0 % (ref 0.0–0.2)

## 2021-08-13 LAB — PHOSPHORUS: Phosphorus: 2 mg/dL — ABNORMAL LOW (ref 2.5–4.6)

## 2021-08-13 MED ORDER — K PHOS MONO-SOD PHOS DI & MONO 155-852-130 MG PO TABS
500.0000 mg | ORAL_TABLET | Freq: Three times a day (TID) | ORAL | Status: DC
  Administered 2021-08-13 – 2021-08-15 (×9): 500 mg via ORAL
  Filled 2021-08-13 (×12): qty 2

## 2021-08-13 MED ORDER — MIRTAZAPINE 15 MG PO TBDP
15.0000 mg | ORAL_TABLET | Freq: Every day | ORAL | Status: DC
  Administered 2021-08-13 – 2021-08-16 (×4): 15 mg via ORAL
  Filled 2021-08-13 (×5): qty 1

## 2021-08-13 NOTE — Progress Notes (Signed)
°   08/13/21 0415  Assess: MEWS Score  Temp 98 F (36.7 C)   To recheck pt's VS

## 2021-08-13 NOTE — TOC Progression Note (Signed)
Transition of Care [facility]) - Progression Note    Patient Details  Name: AYLAN BAYONA MRN: [id number] Date of Birth: June 24, 1941  [facility]) CM/SW [place], RN Phone Number: 08/13/2021, 2:45 PM  Clinical Narrative:   RNCM in to see patient.  Discussed SNF for rehabilitation, and hospice care.  Patient states he would like to remain comfortable, as he is in pain and he cannot eat at this time.  Patient requested that I contact his daughter, Altha Harm.  Patient was able to discuss with daughter his choice of Hospice.  Daughter asked patient questions to ensure he understood his decision, patient verbalized multiple times that he wants to be comfortable and would like to speak to hospice.    RNCM discussed with daughter, who agreed with patient plan.  Patient stated he would like daughter to choose hospice provider.  Daughter chose [facility], and agreed to allow them to discuss with patient.  Sonia Baller and Santiago Glad from [facility] at bedside.  SNF bed search initiated, as patient has the possibility of a facility with hospice to follow.  TOC contact information provided to daughter, TOC to follow to discharge.         Expected Discharge Plan and Services                                                 Social Determinants of Health (SDOH) Interventions    Readmission Risk Interventions No flowsheet data found.

## 2021-08-13 NOTE — Progress Notes (Signed)
PROGRESS NOTE    Earl Ramirez  MGQ:[id number] DOB: Dec 29, 1940 DOA: 08/10/2021 PCP: Baxter Hire, MD   Chief Complain: Edema of lower extremities  Brief Narrative: Patient is 81 year old male with history of stage IV prostate cancer on Xtandi, bladder outlet obstruction status post TURP, status post aortic valve replacement, A. 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  No report of fever or chills.  Patient was admitted for the management of right lower extremity cellulitis, right lower extremity ulcer.  Started on antibiotics.  Patient lives alone.  Patient expresses desire to be enrolled with hospice and does not want any anything to prolong his life.  Palliative care, hospice services consulted today.  Planning for discharge to residential hospice  versus skilled nursing facility with palliative.  Assessment & Plan:   Principal Problem:   Cellulitis Active Problems:   Thrombocytopenia (HCC)   Prostate cancer (HCC)   COPD (chronic obstructive pulmonary disease) (HCC)   Sleep apnea   Lymphedema   Atrial fibrillation (HCC)   Liver cirrhosis (HCC)   Portal hypertension (HCC)   Right lower extremity cellulitis: Initially started on vancomycin and ceftriaxone.  Currently on ceftriaxone only.  Wound care was following.  Afebrile.  Blood cultures negative.  Venous Doppler was negative for DVT. Wound care recommended outpatient follow-up with lymphedema clinic. Continue IV antibiotics for now.  Paroxysmal A. fib: Currently in normal sinus rhythm.  Monitor on telemetry.  has history of chronic thrombocytopenia, on metoprolol for rate control  History of liver cirrhosis with portal hypertension: Currently stable.  Chronic thombocytopenia: Secondary to portal hypertension/liver cirrhosis.  Currently at  baseline  Elevated BNP/volume overload: Takes Lasix 20 mg daily at home.  Does not have any record of echo on file.  Had bilateral lower EXTR edema more on the right after given a dose of  Lasix 40 mg IV once.  Continue Lasix 40 mg daily.  Hypophosphatemia:Supplemented with phosphorus  Hyponatremia: Mild.  Most likely from volume overload.  History of prostate cancer, stage IV: On Xtandi.  On Flomax for bladder outlet obstruction.  He is status post TURP  Debility/deconditioning: Patient states he does not have any family around.  Lives alone.  Very weak, deconditioned, unable to ambulate.  PT recommended and is currently on discharge  Goals of care: Elderly patient with multiple comorbidities, significantly declined with poor quality of life.  Has poor oral intake.  Patient has repeatedly expressed his desire to be enrolled in hospice.  He wants to be moved to residential hospice.  He does not want any life prolonging interventions.  Palliative care/hospice services consulted. Also started on mirtazapine 15 mg to help with his appetite and low mood.           DVT prophylaxis:SCD Code Status: DNR Family Communication: called and discussed with stepdaughter on phone today Patient status:Inpatient  Dispo: The patient is from: home              Anticipated d/c is to: home              Anticipated d/c date is: Residential hospice vs SNF with palliative  Consultants: None  Procedures:None  Antimicrobials:  Anti-infectives (From admission, onward)    Start     Dose/Rate Route Frequency Ordered Stop   08/10/21 0400  cefTRIAXone (ROCEPHIN) 1 g in sodium chloride 0.9 %  100 mL IVPB        1 g 200 mL/hr over 30 Minutes Intravenous Every 24 hours 08/10/21 0305 08/17/21 0359       Subjective:  Patient seen and examined at the bedside this morning.  Hemodynamically stable.  Lying in bed, very weak.  Looks depressed.  He continues to state that he does not want to leave and wants to  go to [facility].  He does not want any life-prolonging care.   Objective: Vitals:   08/13/21 0415 08/13/21 0539 08/13/21 0743 08/13/21 1125  BP: 100/60 112/66 105/60 (!) 98/54  Pulse: (!) 104 (!) 103 (!) 105 86  Resp: 16 16 15 16   Temp: 98 F (36.7 C) (!) 97.5 F (36.4 C) (!) 97.4 F (36.3 C) 97.8 F (36.6 C)  TempSrc: Oral Oral Oral Oral  SpO2: 97% 96% 99% 96%  Weight:      Height:        Intake/Output Summary (Last 24 hours) at 08/13/2021 1255 Last data filed at 08/12/2021 1842 Gross per 24 hour  Intake 0 ml  Output 500 ml  Net -500 ml   Filed Weights   08/09/21 1342 08/09/21 2322  Weight: 107 kg 102.1 kg    Examination:   General exam: Very deconditioned, chronically ill looking HEENT: PERRL Respiratory system:  no wheezes or crackles  Cardiovascular system: S1 & S2 heard, RRR.  Gastrointestinal system: Abdomen is nondistended, soft and nontender. Central nervous system: Alert and oriented Extremities: Bilateral lower extremity lymphedema, ulcer on the right big toe, ulcer on the back of right leg covered with dressing Skin: No rashes,no icterus        Data Reviewed: I have personally reviewed following labs and imaging studies  CBC: Recent Labs  Lab 08/09/21 1344 08/11/21 0624 08/12/21 0426 08/13/21 0653  WBC 6.0 6.0 5.1 8.1  NEUTROABS 4.9  --   --  6.5  HGB 13.6 13.2 12.6* 13.6  HCT 39.4 37.5* 37.0* 39.8  MCV 103.1* 101.9* 103.1* 101.5*  PLT 48* 55* 48* 92*   Basic Metabolic Panel: Recent Labs  Lab 08/09/21 1344 08/11/21 0624 08/12/21 0426 08/13/21 0653  NA 134* 130* 133* 133*  K 3.1* 3.5 3.5 3.8  CL 96* 95* 97* 95*  CO2 27 25 28 28   GLUCOSE 105* 81 107* 108*  BUN 17 18 20  24*  CREATININE 0.55* 0.50* 0.46* 0.49*  CALCIUM 7.2* 7.0* 6.9* 7.0*  MG  --  1.9 2.2  --   PHOS  --  2.3* 2.4* 2.0*   GFR: Estimated Creatinine Clearance: 81.1 mL/min (A) (by C-G formula based on SCr of 0.49 mg/dL (L)). Liver Function Tests: Recent Labs   Lab 08/09/21 1344 08/11/21 0624 08/12/21 0426  AST 25 27 19   ALT 19 16 14   ALKPHOS 73 67 66  BILITOT 5.0* 4.0* 2.7*  PROT 5.4* 4.9* 4.6*  ALBUMIN 2.7* 2.5* 2.3*   No results for input(s): LIPASE, AMYLASE in the last 168 hours. No results for input(s): AMMONIA in the last 168 hours. Coagulation Profile: No results for input(s): INR, PROTIME in the last 168 hours. Cardiac Enzymes: No results for input(s): CKTOTAL, CKMB, CKMBINDEX, TROPONINI in the last 168 hours. BNP (last 3 results) No results for input(s): PROBNP in the last 8760 hours. HbA1C: No results for input(s): HGBA1C in the last 72 hours. CBG: No results for input(s): GLUCAP in the last 168 hours. Lipid Profile: No results for input(s): CHOL, HDL, LDLCALC, TRIG, CHOLHDL, LDLDIRECT in the  last 72 hours. Thyroid Function Tests: No results for input(s): TSH, T4TOTAL, FREET4, T3FREE, THYROIDAB in the last 72 hours. Anemia Panel: No results for input(s): VITAMINB12, FOLATE, FERRITIN, TIBC, IRON, RETICCTPCT in the last 72 hours. Sepsis Labs: Recent Labs  Lab 08/09/21 1344 08/09/21 2315  PROCALCITON  --  0.22  LATICACIDVEN 2.5* 2.7*    Recent Results (from the past 240 hour(s))  Resp Panel by RT-PCR (Flu A&B, Covid) Nasopharyngeal Swab     Status: None   Collection Time: 08/09/21 11:15 PM   Specimen: Nasopharyngeal Swab; Nasopharyngeal(NP) swabs in vial transport medium  Result Value Ref Range Status   SARS Coronavirus 2 by RT PCR NEGATIVE NEGATIVE Final    Comment: (NOTE) SARS-CoV-2 target nucleic acids are NOT DETECTED.  The SARS-CoV-2 RNA is generally detectable in upper respiratory specimens during the acute phase of infection. The lowest concentration of SARS-CoV-2 viral copies this assay can detect is 138 copies/mL. A negative result does not preclude SARS-Cov-2 infection and should not be used as the sole basis for treatment or other patient management decisions. A negative result may occur with   improper specimen collection/handling, submission of specimen other than nasopharyngeal swab, presence of viral mutation(s) within the areas targeted by this assay, and inadequate number of viral copies(<138 copies/mL). A negative result must be combined with clinical observations, patient history, and epidemiological information. The expected result is Negative.  Fact Sheet for Patients:  EntrepreneurPulse.com.au  Fact Sheet for Healthcare Providers:  IncredibleEmployment.be  This test is no t yet approved or cleared by the [place] FDA and  has been authorized for detection and/or diagnosis of SARS-CoV-2 by FDA under an Emergency Use Authorization (EUA). This EUA will remain  in effect (meaning this test can be used) for the duration of the COVID-19 declaration under Section 564(b)(1) of the Act, 21 U.S.C.section 360bbb-3(b)(1), unless the authorization is terminated  or revoked sooner.       Influenza A by PCR NEGATIVE NEGATIVE Final   Influenza B by PCR NEGATIVE NEGATIVE Final    Comment: (NOTE) The Xpert Xpress SARS-CoV-2/FLU/RSV plus assay is intended as an aid in the diagnosis of influenza from Nasopharyngeal swab specimens and should not be used as a sole basis for treatment. Nasal washings and aspirates are unacceptable for Xpert Xpress SARS-CoV-2/FLU/RSV testing.  Fact Sheet for Patients: EntrepreneurPulse.com.au  Fact Sheet for Healthcare Providers: IncredibleEmployment.be  This test is not yet approved or cleared by the [place] FDA and has been authorized for detection and/or diagnosis of SARS-CoV-2 by FDA under an Emergency Use Authorization (EUA). This EUA will remain in effect (meaning this test can be used) for the duration of the COVID-19 declaration under Section 564(b)(1) of the Act, 21 U.S.C. section 360bbb-3(b)(1), unless the authorization is terminated  or revoked.  Performed at [facility], [address] [place], [place] [address]   Blood culture (routine x 2)     Status: None (Preliminary result)   Collection Time: 08/09/21 11:15 PM   Specimen: BLOOD  Result Value Ref Range Status   Specimen Description BLOOD RIGHT HAND  Final   Special Requests   Final    BOTTLES DRAWN AEROBIC AND ANAEROBIC Blood Culture adequate volume   Culture   Final    NO GROWTH 3 DAYS Performed at [facility], [address], [place], [place] [address]    Report Status PENDING  Incomplete         Radiology Studies: No results found.  Scheduled Meds:  enzalutamide  160 mg Oral Daily   furosemide  40 mg Oral Daily   gabapentin  400 mg Oral QID   metoprolol succinate  25 mg Oral Daily   phosphorus  500 mg Oral TID   tamsulosin  0.4 mg Oral Daily   Continuous Infusions:  cefTRIAXone (ROCEPHIN)  IV 1 g (08/13/21 0536)     LOS: 3 days    Time spent: 35 mins.More than 50% of that time was spent in counseling and/or coordination of care.      Shelly Coss, MD [facility] P1/04/2022, 12:55 PM

## 2021-08-13 NOTE — Progress Notes (Signed)
°   08/13/21 0539  Assess: MEWS Score  Temp (!) 97.5 F (36.4 C)   Will continue to monitor pt's VS per orders or if there is a change in the pt's condition

## 2021-08-13 NOTE — Evaluation (Signed)
Occupational Therapy Evaluation Patient Details Name: Earl Ramirez MRN: [id number] DOB: [birth year] Today's Date: 08/13/2021   History of Present Illness Pt is an 81 y/o M who was admitted on 08/10/21 after presenting with c/o BLE edma & RLE pain. Pt is being treated for RLE cellulitis. 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 Prior to hospital admission, pt was at home, recently d/c from rehab. Pt lives alone, poor historian. Pt presents to acute OT demonstrating impaired ADL performance and functional mobility 2/2 decreased activity tolerance and functional strength/ROM/balance deficits. Pt states "I want to go to [place]." RN notified.  Pt currently requires SETUP bed level self-drinking. MAX A for LB access at bed level. MAX A rolling for periaccess at bed level. Pt would benefit from skilled OT to address noted impairments and functional limitations (see below for any additional details). Upon hospital discharge, recommend STR to maximize pt safety and return to PLOF. Pt agreeable to therapy to assist with goal of "keeping comfortable."       Recommendations for follow up therapy are one component of a multi-disciplinary discharge planning process, led by the attending physician.  Recommendations may be updated based on patient status, additional functional criteria and insurance authorization.   Follow Up Recommendations  Skilled nursing-short term rehab (<3 hours/day)    Assistance Recommended at Discharge Frequent or constant Supervision/Assistance  Patient can return home with the following Two people to help with walking and/or transfers;Two people to help with bathing/dressing/bathroom;Help with stairs or ramp for entrance    Functional  Status Assessment  Patient has had a recent decline in their functional status and demonstrates the ability to make significant improvements in function in a reasonable and predictable amount of time.  Equipment Recommendations  Other (comment) (defer)    Recommendations for Other Services       Precautions / Restrictions Precautions Precautions: Fall Precaution Comments: BLE unna boots (lymphadema, wounds) Restrictions Weight Bearing Restrictions: No      Mobility Bed Mobility Overal bed mobility: Needs Assistance Bed Mobility: Rolling Rolling: Max assist         General bed mobility comments: heavy use of rails                        ADL either performed or assessed with clinical judgement   ADL Overall ADL's : Needs assistance/impaired                                       General ADL Comments: SETUP bed level self-drinking. MAX A for LB access at bed level. MAX A rolling for periaccess at bed level.      Pertinent Vitals/Pain Pain Assessment: 0-10 Pain Score: 9  Pain Location: BLE (R>L) Pain Descriptors / Indicators: Discomfort;Aching Pain Intervention(s): Limited activity within patient's tolerance;Repositioned;Premedicated before session;Ice applied     Hand Dominance Right   Extremity/Trunk Assessment Upper Extremity Assessment Upper Extremity Assessment: Generalized weakness   Lower Extremity Assessment Lower Extremity Assessment: Generalized weakness       Communication Communication Communication: No difficulties   Cognition Arousal/Alertness: Awake/alert Behavior During Therapy: Flat affect Overall Cognitive Status: Difficult to assess  General Comments: Pt is a poor/inconsistent historian in regards to PLOF/home set up. States "I want to go to my home [place]." RN notified                Home Living Family/patient expects to be discharged to:: Private  residence Living Arrangements: Alone Available Help at Discharge: Family;Available PRN/intermittently Type of Home: House Home Access:  (pt reports ramp with 3 + 7 steps to enter with L rail)     Home Layout: One level               Home Equipment: Shower seat;Grab bars - tub/shower;Cane - single point;Rollator (4 wheels);Rolling Walker (2 wheels)          Prior Functioning/Environment Prior Level of Function : Needs assist             Mobility Comments: Pt reports he returned home from rehab ~2 weeks ago & that is the last time he was walking but does report he was using a walker with a seat. When asked if he sat on it & scooted around he stated no. Reports he was getting to the bathroom with great difficulty. Poor, inconsistent historian.          OT Problem List: Decreased range of motion;Decreased strength;Decreased activity tolerance;Impaired balance (sitting and/or standing);Decreased safety awareness      OT Treatment/Interventions: Self-care/ADL training;Therapeutic exercise;Energy conservation;DME and/or AE instruction;Therapeutic activities;Patient/family education;Balance training    OT Goals(Current goals can be found in the care plan section) Acute Rehab OT Goals Patient Stated Goal: to be comfortable OT Goal Formulation: With patient Time For Goal Achievement: 08/27/21 Potential to Achieve Goals: Fair ADL Goals Pt Will Perform Grooming: with min assist;sitting Pt Will Perform Lower Body Dressing: with mod assist;bed level Pt Will Transfer to Toilet: with mod assist (rolling at bed level)  OT Frequency: Min 1X/week    Co-evaluation              AM-PAC OT "6 Clicks" Daily Activity     Outcome Measure Help from another person eating meals?: A Little Help from another person taking care of personal grooming?: A Lot Help from another person toileting, which includes using toliet, bedpan, or urinal?: A Lot Help from another person bathing  (including washing, rinsing, drying)?: A Lot Help from another person to put on and taking off regular upper body clothing?: A Lot Help from another person to put on and taking off regular lower body clothing?: A Lot 6 Click Score: 13   End of Session Nurse Communication: Mobility status  Activity Tolerance: Patient limited by fatigue;Patient limited by pain Patient left: in bed;with call bell/phone within reach;with bed alarm set  OT Visit Diagnosis: Muscle weakness (generalized) (M62.81)                Time: 5462-7035 OT Time Calculation (min): 17 min Charges:  OT General Charges $OT Visit: 1 Visit OT Evaluation $OT Eval Low Complexity: 1 Low OT Treatments $Self Care/Home Management : 8-22 mins  Dessie Coma, M.S. OTR/L  08/13/21, 1:00 PM  ascom [phone]

## 2021-08-13 NOTE — Progress Notes (Signed)
Manufacturing engineer [facility]) hospital Liaison note:  Notified by Henrene Hawking regarding patient/family interest in [place] home. Hospice home eligibility has been confirmed by hospice physician Dr. Gilford Rile.  Writer met with patient at bedside to initiate education regarding hospice home services. Questions answered. Unfortunately hospice home is unable to offer a bed today.  Plan at this time is for [facility] liaison to meet again with patient and his step daughter Altha Harm tomorrow 1/10.   Hospital care team, patient and family aware. Thank you for this referral.  Flo Shanks BSN, RN, [place] [phone]

## 2021-08-13 NOTE — Evaluation (Signed)
Physical Therapy Evaluation Patient Details Name: Earl Ramirez MRN: [id number] DOB: November 29, 1940 Today's Date: 08/13/2021  History of Present Illness  Pt is an 81 y/o M who was admitted on 08/10/21 after presenting with c/o BLE edma & RLE pain. Pt is being treated for RLE cellulitis. 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  Pt requires max assist for repositioning in bed with hospital bed features. Pt requires assistance to lift RLE back onto bed & PT educated pt on importance of repositioning to prevent skin breakdown on buttocks; pt positioned in L sidelying. Pt frequently stating he wants to die & wants to go to hospice house - team made aware. Will continue to follow pt acutely to address bed mobility & repositioning until pt's goals are care are updated.     Recommendations for follow up therapy are one component of a multi-disciplinary discharge planning process, led by the attending physician.  Recommendations may be updated based on patient status, additional functional criteria and insurance authorization.  Follow Up Recommendations Skilled nursing-short term rehab (<3 hours/day)    Assistance Recommended at Discharge Frequent or constant Supervision/Assistance  Patient can return home with the following  Two people to help with walking and/or transfers;Two people to help with bathing/dressing/bathroom;Help with stairs or ramp for entrance;Assist for transportation;Direct supervision/assist for financial management;Assistance with Forensic psychologist (measurements PT);Wheelchair cushion (measurements PT)  Recommendations for  Other Services       Functional Status Assessment Patient has had a recent decline in their functional status and demonstrates the ability to make significant improvements in function in a reasonable and predictable amount of time.     Precautions / Restrictions Precautions Precautions: Fall Precaution Comments: BLE unna boots (lymphadema, wounds) Restrictions Weight Bearing Restrictions: No      Mobility  Bed Mobility Overal bed mobility: Needs Assistance Bed Mobility: Rolling Rolling: Max assist         General bed mobility comments: limited use of BLE to assist with rolling, use of bed rails; pt is able to scoot to [facility] with bed rails, max assist & bed in trendelenburg position. Pt declines trying to sit on EOB.    Transfers                        Ambulation/Gait                  Stairs            Wheelchair Mobility    Modified Rankin (Stroke Patients Only)       Balance                                             Pertinent Vitals/Pain Pain Assessment: 0-10 Pain Score: 9  Pain Location: BLE (R>L) Pain Descriptors / Indicators: Discomfort;Aching Pain Intervention(s): Monitored during session (Pt reporting "I want morphine. I want something stronger than tramadol." - nurse made aware)    Home Living Family/patient expects to be discharged to:: Private residence Living Arrangements: Alone   Type of Home: House Home Access:  (  pt reports ramp with 3 + 7 steps to enter with L rail)       Home Layout: One level        Prior Function               Mobility Comments: Pt reports he returned home from rehab ~2 weeks ago & that is the last time he was walking but does report he was using a walker with a seat. When asked if he sat on it & scooted around he stated no. Reports he was getting to the bathroom with great difficulty. Poor, inconsistent historian.       Hand Dominance        Extremity/Trunk  Assessment   Upper Extremity Assessment Upper Extremity Assessment: Generalized weakness    Lower Extremity Assessment Lower Extremity Assessment: Generalized weakness (Pt with BLE unna boots, bloody drainage from RLE, Blisters, scales on BLE, redness noted.)       Communication   Communication: No difficulties  Cognition Arousal/Alertness: Awake/alert Behavior During Therapy: Flat affect Overall Cognitive Status: Difficult to assess                                 General Comments: Pt is a poor/inconsistent historian in regards to PLOF/home set up. Repeatedly states "I want to die. I want to go to hospice house." - nurse made aware & pt reports MD is aware too (PT contacted MD via secure chat as well).        General Comments      Exercises     Assessment/Plan    PT Assessment Patient needs continued PT services  PT Problem List Decreased strength;Decreased mobility;Decreased activity tolerance;Decreased range of motion;Decreased skin integrity;Pain;Decreased knowledge of use of DME;Decreased balance       PT Treatment Interventions DME instruction;Therapeutic exercise;Gait training;Balance training;Stair training;Neuromuscular re-education;Wheelchair mobility training;Modalities;Functional mobility training;Therapeutic activities;Patient/family education    PT Goals (Current goals can be found in the Care Plan section)  Acute Rehab PT Goals Patient Stated Goal: "I want to die. I want to go to hospice house." PT Goal Formulation: With patient Time For Goal Achievement: 08/27/21 Potential to Achieve Goals: Fair    Frequency Min 2X/week     Co-evaluation               AM-PAC PT "6 Clicks" Mobility  Outcome Measure Help needed turning from your back to your side while in a flat bed without using bedrails?: Total Help needed moving from lying on your back to sitting on the side of a flat bed without using bedrails?: Total Help needed moving to and  from a bed to a chair (including a wheelchair)?: Total Help needed standing up from a chair using your arms (e.g., wheelchair or bedside chair)?: Total Help needed to walk in hospital room?: Total Help needed climbing 3-5 steps with a railing? : Total 6 Click Score: 6    End of Session   Activity Tolerance: Patient limited by pain (pt self limiting) Patient left: in bed;with call bell/phone within reach;with bed alarm set Nurse Communication:  (pt's request for stronger pain medication, statements re: wanting to die) PT Visit Diagnosis: Unsteadiness on feet (R26.81);Muscle weakness (generalized) (M62.81);Difficulty in walking, not elsewhere classified (R26.2);Other (comment);Pain Pain - Right/Left:  (BLE) Pain - part of body: Ankle and joints of foot    Time: 4081-4481 PT Time Calculation (min) (ACUTE ONLY): 16 min   Charges:  PT Evaluation $PT Eval Moderate Complexity: 1 Mod          Lavone Nian, PT, DPT 08/13/21, 10:29 AM   Waunita Schooner 08/13/2021, 10:27 AM

## 2021-08-13 NOTE — NC FL2 (Signed)
[place] LEVEL OF CARE SCREENING TOOL     IDENTIFICATION  Patient Name: Earl Ramirez Birthdate: March 28, 1941 Sex: male Admission Date (Current Location): 08/10/2021  [facility] and [place] Number:  Engineering geologist and Address:  [facility], [address], [place], [place] [address]      Provider Number: [id number]  Attending Physician Name and Address:  Shelly Coss, MD  Relative Name and Phone Number:       Current Level of Care: Hospital Recommended Level of Care: [place] Prior Approval Number:    Date Approved/Denied:   PASRR Number: [id number]  Discharge Plan: SNF    Current Diagnoses: Patient Active Problem List   Diagnosis Date Noted   Cellulitis 06/28/2021   Tremors of nervous system    Physical deconditioning    Acute cystitis with hematuria    Osteoarthritis of lumbar spine    Lower extremity weakness 06/18/2021   Thyroid nodule 06/18/2021   Atrial fibrillation ([place]) 06/18/2021   Primary hypertension 06/18/2021   Liver cirrhosis ([place]) 06/18/2021   Weakness 06/18/2021   Metastatic malignant neoplasm ([place])    Portal hypertension ([place])    Vitamin D deficiency 03/27/2020   Chronic venous insufficiency 03/23/2020   PAD (peripheral artery disease) ([place]) 03/23/2020   Lymphedema 03/23/2020   COPD (chronic obstructive pulmonary disease) ([place]) 12/17/2019   Idiopathic peripheral neuropathy 12/17/2019   Sleep apnea 12/17/2019   Splenomegaly 12/17/2019   Aortic valve disease 06/08/2018   Edema of both lower extremities due to peripheral venous insufficiency 03/19/2017   Bilateral chronic knee pain 02/12/2016   Bilateral leg pain 02/12/2016   Incomplete emptying of bladder 04/07/2015   Chronic atrial flutter ([place]) 01/30/2015   SOB (shortness of breath) 12/07/2014   Thrombocytopenia ([place]) 12/02/2014   Prostate cancer ([place]) 11/22/2014   Pyuria 10/06/2014   Gross hematuria 10/04/2014   Urinary  retention 10/04/2014   Heart valve replaced by other means 08/05/1994    Orientation RESPIRATION BLADDER Height & Weight     Self, Time, Situation, Place    External catheter Weight: 102.1 kg Height:  5\' 7"  (170.2 cm)  BEHAVIORAL SYMPTOMS/MOOD NEUROLOGICAL BOWEL NUTRITION STATUS      Continent Diet (Heart)  AMBULATORY STATUS COMMUNICATION OF NEEDS Skin   Extensive Assist (Did not ambulate) Verbally PU Stage and Appropriate Care (Stage 2 on sacrum, venous stasis ulcer toe, unaboot in place)                       Personal Care Assistance Level of Assistance  Bathing, Feeding, Dressing Bathing Assistance: Maximum assistance Feeding assistance: Limited assistance Dressing Assistance: Maximum assistance     Functional Limitations Info  Sight, Hearing, Speech Sight Info: Impaired Hearing Info: Impaired Speech Info: Adequate    SPECIAL CARE FACTORS FREQUENCY                       Contractures      Additional Factors Info  Code Status, Allergies Code Status Info: DNR Allergies Info: Oxycodone-acetaminophen High  Nausea Only, Shortness Of Breath   Oxycodone-acetaminophen Not Specified   Other reaction(s): Drowsy  Amoxicillin Low  Itching Hand itching  Penicillins           Current Medications (08/13/2021):  This is the current hospital active medication list Current Facility-Administered Medications  Medication Dose Route Frequency Provider Last Rate Last Admin   acetaminophen (TYLENOL) tablet 650 mg  650 mg Oral Q6H  PRN Athena Masse, MD   650 mg at 08/10/21 4765   Or   acetaminophen (TYLENOL) suppository 650 mg  650 mg Rectal Q6H PRN Athena Masse, MD       ALPRAZolam Duanne Moron) tablet 0.25 mg  0.25 mg Oral Daily PRN Irene Pap N, DO       cefTRIAXone (ROCEPHIN) 1 g in sodium chloride 0.9 % 100 mL IVPB  1 g Intravenous Q24H Judd Gaudier V, MD 200 mL/hr at 08/13/21 0536 1 g at 08/13/21 0536   enzalutamide (XTANDI) tablet 160 mg  160 mg Oral Daily Hall, Carole  N, DO       furosemide (LASIX) tablet 40 mg  40 mg Oral Daily Shelly Coss, MD   40 mg at 08/13/21 0857   gabapentin (NEURONTIN) capsule 400 mg  400 mg Oral QID Irene Pap N, DO   400 mg at 08/13/21 0856   metoprolol succinate (TOPROL-XL) 24 hr tablet 25 mg  25 mg Oral Daily Shelly Coss, MD   25 mg at 08/13/21 0857   mirtazapine (REMERON SOL-TAB) disintegrating tablet 15 mg  15 mg Oral QHS Shelly Coss, MD       ondansetron (ZOFRAN) tablet 4 mg  4 mg Oral Q6H PRN Athena Masse, MD       Or   ondansetron [facility]) injection 4 mg  4 mg Intravenous Q6H PRN Athena Masse, MD   4 mg at 08/11/21 1330   phosphorus (K PHOS NEUTRAL) tablet 500 mg  500 mg Oral TID Shelly Coss, MD   500 mg at 08/13/21 0900   tamsulosin (FLOMAX) capsule 0.4 mg  0.4 mg Oral Daily Irene Pap N, DO   0.4 mg at 08/13/21 0856   traMADol-acetaminophen (ULTRACET) 37.5-325 MG per tablet 1-2 tablet  1-2 tablet Oral Q6H PRN Kayleen Memos, DO   1 tablet at 08/13/21 4650     Discharge Medications: Please see discharge summary for a list of discharge medications.  Relevant Imaging Results:  Relevant Lab Results:   Additional Information SS# [id number]  Pete Pelt, RN

## 2021-08-14 ENCOUNTER — Ambulatory Visit: Payer: PPO | Admitting: Oncology

## 2021-08-14 ENCOUNTER — Ambulatory Visit: Payer: PPO

## 2021-08-14 ENCOUNTER — Other Ambulatory Visit: Payer: PPO

## 2021-08-14 DIAGNOSIS — L03115 Cellulitis of right lower limb: Secondary | ICD-10-CM | POA: Diagnosis not present

## 2021-08-14 DIAGNOSIS — L8990 Pressure ulcer of unspecified site, unspecified stage: Secondary | ICD-10-CM | POA: Insufficient documentation

## 2021-08-14 LAB — PHOSPHORUS: Phosphorus: 2.2 mg/dL — ABNORMAL LOW (ref 2.5–4.6)

## 2021-08-14 MED ORDER — HYDROMORPHONE HCL 1 MG/ML IJ SOLN
0.5000 mg | INTRAMUSCULAR | Status: DC | PRN
  Administered 2021-08-14 – 2021-08-17 (×5): 0.5 mg via INTRAVENOUS
  Filled 2021-08-14 (×5): qty 1

## 2021-08-14 NOTE — Progress Notes (Signed)
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    Patient was admitted for the management of right lower extremity cellulitis, right lower extremity ulcer.  Started on antibiotics.  Patient lives alone.  Patient expressed desire to be enrolled with hospice and does not want any anything to prolong his life. Hospice services consulted.  He is waiting for bed at residential hospice.    Assessment & Plan:   Principal Problem:   Cellulitis Active Problems:   Thrombocytopenia (HCC)   Prostate cancer (HCC)   COPD (chronic obstructive pulmonary disease) (HCC)   Sleep apnea   Lymphedema   Atrial fibrillation (HCC)   Liver cirrhosis (HCC)   Portal hypertension (HCC)   Pressure injury of skin   Right lower extremity cellulitis: Initially started on vancomycin and ceftriaxone.  Currently on ceftriaxone only.  Wound care was following.  Afebrile.  Blood cultures negative.  Venous Doppler was negative for DVT. Wound care recommended outpatient follow-up with lymphedema clinic. Continue current abx for now until he gets to residential hospice  Paroxysmal A. fib: Currently in normal sinus rhythm.   has history of chronic thrombocytopenia, on metoprolol for rate control  History of liver cirrhosis with portal hypertension: Currently stable.  Chronic thombocytopenia: Secondary to portal hypertension/liver cirrhosis.  Currently at baseline  Elevated BNP/volume overload: Takes Lasix 20 mg daily  at home.  Does not have any record of echo on file.  Had bilateral lower EXTR edema more on the right after given a dose of  Lasix 40 mg IV once.  On Lasix 40 mg daily.  Hypophosphatemia:Supplemented with phosphorus  History of prostate cancer, stage IV: On Xtandi.  On Flomax for bladder outlet obstruction.  He is status post TURP  Debility/deconditioning: Patient states he does not have any family around.  Lives alone.  Very weak, deconditioned, unable to ambulate.   Goals of care: Elderly patient with multiple comorbidities, significantly declined with poor quality of life.  Has poor oral intake.  Patient has repeatedly expressed his desire to be enrolled in hospice.  He wants to be moved to residential hospice.  He does not want any life prolonging interventions.  Hospice services consulted.  Patient is clear about his goals so Palliative care not consulted. Started on mirtazapine 15 mg to help with his appetite and low mood. Will continue current management until he gets into residential hospice.  Continue pain medications.  No escalation of care, no blood work           DVT prophylaxis:SCD Code Status: DNR Family Communication: called and discussed with stepdaughter on phone on 1/9  Patient status:Inpatient  Dispo: The patient is from: home              Anticipated d/c is to: home              Anticipated d/c date is: Residential hospice Consultants: None  Procedures:None  Antimicrobials:  Anti-infectives (From admission, onward)    Start     Dose/Rate Route Frequency Ordered Stop   08/10/21 0400  cefTRIAXone (ROCEPHIN) 1 g in sodium chloride 0.9 % 100 mL IVPB        1 g 200 mL/hr over 30 Minutes Intravenous Every 24 hours 08/10/21 0305 08/17/21 0359       Subjective:  Patient seen and examined at the bedside this morning.  Lying in bed, weak.  Complains of pain on the right lower extremity around the ulcer.  Continues to express his desire to move into residential  hospice  Objective: Vitals:   08/13/21 1931 08/14/21 0505 08/14/21 0802 08/14/21 1228  BP: (!) 92/43 111/64 106/67 94/70  Pulse: 70 (!) 106 88 (!) 105  Resp: 16 18 17 16   Temp: 98.6 F (37 C) 97.6 F (36.4 C) (!) 97.5 F (36.4 C) (!) 97.5 F (36.4 C)  TempSrc: Tympanic  Oral Oral  SpO2: 99% 99% 97% 97%  Weight:      Height:        Intake/Output Summary (Last 24 hours) at 08/14/2021 1231 Last data filed at 08/13/2021 2208 Gross per 24 hour  Intake 240 ml  Output 700 ml  Net -460 ml   Filed Weights   08/09/21 1342 08/09/21 2322  Weight: 107 kg 102.1 kg    Examination:   General exam: Very deconditioned, chronically ill looking, obese Respiratory system:  no wheezes or crackles  Cardiovascular system: S1 & S2 heard, RRR.  Gastrointestinal system: Abdomen is nondistended, soft and nontender. Central nervous system: Alert and oriented Extremities: Bilateral lymphedema, ulcer on the right big toe, ulcer on the back of right leg covered with dressing Skin: No rashes,no icterus      Data Reviewed: I have personally reviewed following labs and imaging studies  CBC: Recent Labs  Lab 08/09/21 1344 08/11/21 0624 08/12/21 0426 08/13/21 0653  WBC 6.0 6.0 5.1 8.1  NEUTROABS 4.9  --   --  6.5  HGB 13.6 13.2 12.6* 13.6  HCT 39.4 37.5* 37.0* 39.8  MCV 103.1* 101.9* 103.1* 101.5*  PLT 48* 55* 48* 92*   Basic Metabolic Panel: Recent Labs  Lab 08/09/21 1344 08/11/21 0624 08/12/21 0426 08/13/21 0653 08/14/21 0633  NA 134* 130* 133* 133*  --   K 3.1* 3.5 3.5 3.8  --   CL 96* 95* 97* 95*  --   CO2 27 25 28 28   --   GLUCOSE 105* 81 107* 108*  --   BUN 17 18 20  24*  --   CREATININE 0.55* 0.50* 0.46* 0.49*  --   CALCIUM 7.2* 7.0* 6.9* 7.0*  --   MG  --  1.9 2.2  --   --   PHOS  --  2.3* 2.4* 2.0* 2.2*   GFR: Estimated Creatinine Clearance: 81.1 mL/min (A) (by C-G formula based on SCr of 0.49 mg/dL (L)). Liver Function Tests: Recent Labs  Lab 08/09/21 1344  08/11/21 0624 08/12/21 0426  AST 25 27 19   ALT 19 16 14   ALKPHOS 73 67 66  BILITOT 5.0* 4.0* 2.7*  PROT 5.4* 4.9* 4.6*  ALBUMIN 2.7* 2.5* 2.3*   No results for input(s): LIPASE, AMYLASE in the last 168 hours. No results for input(s): AMMONIA in the last 168 hours. Coagulation Profile: No results for input(s): INR, PROTIME in the last 168 hours. Cardiac Enzymes: No results for input(s): CKTOTAL, CKMB, CKMBINDEX, TROPONINI in the last 168 hours. BNP (last 3 results) No results for input(s): PROBNP in the last 8760 hours. HbA1C: No results for input(s): HGBA1C in the last 72 hours. CBG: No results for  input(s): GLUCAP in the last 168 hours. Lipid Profile: No results for input(s): CHOL, HDL, LDLCALC, TRIG, CHOLHDL, LDLDIRECT in the last 72 hours. Thyroid Function Tests: No results for input(s): TSH, T4TOTAL, FREET4, T3FREE, THYROIDAB in the last 72 hours. Anemia Panel: No results for input(s): VITAMINB12, FOLATE, FERRITIN, TIBC, IRON, RETICCTPCT in the last 72 hours. Sepsis Labs: Recent Labs  Lab 08/09/21 1344 08/09/21 2315  PROCALCITON  --  0.22  LATICACIDVEN 2.5* 2.7*    Recent Results (from the past 240 hour(s))  Resp Panel by RT-PCR (Flu A&B, Covid) Nasopharyngeal Swab     Status: None   Collection Time: 08/09/21 11:15 PM   Specimen: Nasopharyngeal Swab; Nasopharyngeal(NP) swabs in vial transport medium  Result Value Ref Range Status   SARS Coronavirus 2 by RT PCR NEGATIVE NEGATIVE Final    Comment: (NOTE) SARS-CoV-2 target nucleic acids are NOT DETECTED.  The SARS-CoV-2 RNA is generally detectable in upper respiratory specimens during the acute phase of infection. The lowest concentration of SARS-CoV-2 viral copies this assay can detect is 138 copies/mL. A negative result does not preclude SARS-Cov-2 infection and should not be used as the sole basis for treatment or other patient management decisions. A negative result may occur with  improper specimen  collection/handling, submission of specimen other than nasopharyngeal swab, presence of viral mutation(s) within the areas targeted by this assay, and inadequate number of viral copies(<138 copies/mL). A negative result must be combined with clinical observations, patient history, and epidemiological information. The expected result is Negative.  Fact Sheet for Patients:  EntrepreneurPulse.com.au  Fact Sheet for Healthcare Providers:  IncredibleEmployment.be  This test is no t yet approved or cleared by the [place] FDA and  has been authorized for detection and/or diagnosis of SARS-CoV-2 by FDA under an Emergency Use Authorization (EUA). This EUA will remain  in effect (meaning this test can be used) for the duration of the COVID-19 declaration under Section 564(b)(1) of the Act, 21 U.S.C.section 360bbb-3(b)(1), unless the authorization is terminated  or revoked sooner.       Influenza A by PCR NEGATIVE NEGATIVE Final   Influenza B by PCR NEGATIVE NEGATIVE Final    Comment: (NOTE) The Xpert Xpress SARS-CoV-2/FLU/RSV plus assay is intended as an aid in the diagnosis of influenza from Nasopharyngeal swab specimens and should not be used as a sole basis for treatment. Nasal washings and aspirates are unacceptable for Xpert Xpress SARS-CoV-2/FLU/RSV testing.  Fact Sheet for Patients: EntrepreneurPulse.com.au  Fact Sheet for Healthcare Providers: IncredibleEmployment.be  This test is not yet approved or cleared by the [place] FDA and has been authorized for detection and/or diagnosis of SARS-CoV-2 by FDA under an Emergency Use Authorization (EUA). This EUA will remain in effect (meaning this test can be used) for the duration of the COVID-19 declaration under Section 564(b)(1) of the Act, 21 U.S.C. section 360bbb-3(b)(1), unless the authorization is terminated or revoked.  Performed at [facility], [address] [place], [place] [address]   Blood culture (routine x 2)     Status: None (Preliminary result)   Collection Time: 08/09/21 11:15 PM   Specimen: BLOOD  Result Value Ref Range Status   Specimen Description BLOOD RIGHT HAND  Final   Special Requests   Final    BOTTLES DRAWN AEROBIC AND ANAEROBIC Blood Culture adequate volume   Culture   Final    NO GROWTH 4 DAYS Performed at [facility], [address], [place], [place] [address]  Report Status PENDING  Incomplete         Radiology Studies: No results found.      Scheduled Meds:  enzalutamide  160 mg Oral Daily   furosemide  40 mg Oral Daily   gabapentin  400 mg Oral QID   metoprolol succinate  25 mg Oral Daily   mirtazapine  15 mg Oral QHS   phosphorus  500 mg Oral TID   tamsulosin  0.4 mg Oral Daily   Continuous Infusions:  cefTRIAXone (ROCEPHIN)  IV 1 g (08/14/21 0506)     LOS: 4 days    Time spent: 35 mins.More than 50% of that time was spent in counseling and/or coordination of care.      Shelly Coss, MD [facility] P1/05/2022, 12:31 PM

## 2021-08-14 NOTE — TOC Progression Note (Signed)
Transition of Care [facility]) - Progression Note    Patient Details  Name: Earl Ramirez MRN: [id number] Date of Birth: July 21, 1941  Transition of Care [facility]) CM/SW [place], RN Phone Number: 08/14/2021, 11:20 AM  Clinical Narrative:   Patient qualifies for residential hospice as per Sonia Baller at [facility].  There is not a bed available today.  Hospice Liaison will follow up with TOC when patient can transfer.  TOC contact information provided to patient and family, TOC to follow to discharge.         Expected Discharge Plan and Services                                                 Social Determinants of Health (SDOH) Interventions    Readmission Risk Interventions No flowsheet data found.

## 2021-08-14 NOTE — Care Management Important Message (Signed)
Important Message  Patient Details  Name: Earl Ramirez MRN: [id number] Date of Birth: 01-Aug-1941   Medicare Important Message Given:  Other (see comment)  Patient wishes to discharge to the Hospice Home once a bed is available and daughter is in agreement. Out of respect for the patient and family no Important Message from [facility] given.    [name] 08/14/2021, 8:40 AM

## 2021-08-14 NOTE — Progress Notes (Signed)
[facility]) Hospital Liaison Note   Unfortunately, Hospice Home is not able to offer a room today. Daughter at bedside and Dreama Saa, RN [facility] Manager aware hospital liaison will follow up tomorrow or sooner if a room becomes available.    Please do not hesitate to call with any hospice related questions.    Thank you for the opportunity to participate in this patient's care.   Bobbie "Loren Racer, RN, BSN [facility] Liaison [phone]

## 2021-08-15 DIAGNOSIS — J438 Other emphysema: Secondary | ICD-10-CM

## 2021-08-15 DIAGNOSIS — E876 Hypokalemia: Secondary | ICD-10-CM

## 2021-08-15 DIAGNOSIS — I890 Lymphedema, not elsewhere classified: Secondary | ICD-10-CM | POA: Diagnosis not present

## 2021-08-15 DIAGNOSIS — E871 Hypo-osmolality and hyponatremia: Secondary | ICD-10-CM

## 2021-08-15 DIAGNOSIS — L03115 Cellulitis of right lower limb: Secondary | ICD-10-CM | POA: Diagnosis not present

## 2021-08-15 LAB — CULTURE, BLOOD (ROUTINE X 2)
Culture: NO GROWTH
Special Requests: ADEQUATE

## 2021-08-15 NOTE — Progress Notes (Signed)
OT Cancellation Note  Patient Details Name: Earl Ramirez MRN: [id number] DOB: 1941/01/04   Cancelled Treatment:    Reason Eval/Treat Not Completed: Other (comment) Pt with plan to d/c to hospice home upon leaving the hospital. Will complete OT order at this time. Thank you for consulting OT in the care of this patient.   Gerrianne Scale, [place], OTR/L ascom [phone] 08/15/21, 12:43 PM

## 2021-08-15 NOTE — Progress Notes (Signed)
°   08/14/21 2026  Assess: MEWS Score  Temp (!) 97.4 F (36.3 C)  BP 99/63  Pulse Rate (!) 110  Resp 16  SpO2 93 %  O2 Device Room Air  Assess: MEWS Score  MEWS Temp 0  MEWS Systolic 1  MEWS Pulse 1  MEWS RR 0  MEWS LOC 0  MEWS Score 2  MEWS Score Color Yellow  Assess: if the MEWS score is Yellow or Red  Were vital signs taken at a resting state? Yes  Focused Assessment No change from prior assessment  Does the patient meet 2 or more of the SIRS criteria? No  MEWS guidelines implemented *See Row Information* No, vital signs rechecked  Treat  MEWS Interventions Administered prn meds/treatments  Pain Scale 0-10  Pain Score 0  Take Vital Signs  Increase Vital Sign Frequency  Yellow: Q 2hr X 2 then Q 4hr X 2, if remains yellow, continue Q 4hrs  Escalate  MEWS: Escalate Yellow: discuss with charge nurse/RN and consider discussing with provider and RRT  Notify: Charge Nurse/RN  Name of Charge Nurse/RN Notified Phyllis, RN  Date Charge Nurse/RN Notified 08/14/21  Time Charge Nurse/RN Notified 2030  Document  Progress note created (see row info) Yes  Assess: SIRS CRITERIA  SIRS Temperature  0  SIRS Pulse 1  SIRS Respirations  0  SIRS WBC 0  SIRS Score Sum  1   Pt has been chronically tachycardic.  Not symptomatic. Will continue to monitor

## 2021-08-15 NOTE — Progress Notes (Signed)
[facility]) Hospital Liaison Note   Unfortunately, Hospice Home is not able to offer a room today. Family and Jiles Garter, RN [facility] Manager aware hospital liaison will follow up tomorrow or sooner if a room becomes available.    Please do not hesitate to call with any hospice related questions.    Thank you for the opportunity to participate in this patient's care.  Daphene Calamity, MSW [facility] Liaison [phone]

## 2021-08-15 NOTE — Progress Notes (Signed)
PT Cancellation Note  Patient Details Name: Earl Ramirez MRN: [id number] DOB: [birth year]   Cancelled Treatment:    Reason Eval/Treat Not Completed: Other (comment).  Chart reviewed and attempted to see pt.  Upon arrival to room, daughter present and stating that pt will be going on Hospice care soon.  Pt noted to be cold, so warm blankets supplied for pt.  Orders will be completed at this time.  Please re-consult if any updates occur.     Gwenlyn Saran, PT, DPT 08/15/21, 12:37 PM

## 2021-08-15 NOTE — Progress Notes (Signed)
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    Patient was admitted for the management of right lower extremity cellulitis, right lower extremity ulcer.  Started on antibiotics.  Patient lives alone.  Patient expressed desire to be enrolled with hospice and does not want any anything to prolong his life. Hospice services consulted.  He is waiting for bed at residential hospice.     Assessment & Plan:   Principal Problem:   Cellulitis Active Problems:   Thrombocytopenia (HCC)   Prostate cancer (HCC)   COPD (chronic obstructive pulmonary disease) (HCC)   Sleep apnea   Lymphedema   Atrial fibrillation (HCC)   Liver cirrhosis (HCC)   Portal hypertension (HCC)   Pressure injury of skin  Right lower extremity cellulitis:  Initially started on vancomycin and ceftriaxone.  Currently on ceftriaxone only.  Wound care was following. Continue ceftriaxone for now.  Will change to oral antibiotics for 2 more days if discharged tomorrow.  Currently pending inpatient hospice transfer.  Paroxysmal atrial fibrillation. Chronic thrombocytopenia. Currently on metoprolol.  Not a candidate for anticoagulation.  Chronic lymphedema. Mild elevation of BNP. Continue Lasix 40 mg daily.  Hypokalemia Hyponatremia. Hypophosphatemia Potassium and phosphorus was repleted. Sodium level stable.  Stage IV prostate cancer. Status post TURP.     DVT prophylaxis: SCDs Code Status: DNR Family Communication:  Disposition Plan:     Status is: Inpatient  Remains inpatient appropriate because: Unsafe discharge, pending transfer to inpatient hospice      I/O last 3 completed shifts: In: 240 [P.O.:240] Out: 1150 [Urine:1150] No intake/output data recorded.     Consultants:    Procedures: None  Antimicrobials: Ceftriaxone.  Subjective: Patient slept well last night, currently does not feel any short of breath No abdominal pain nausea vomiting  No dysuria hematuria.  Objective: Vitals:   08/14/21 2026 08/15/21 0006 08/15/21 0013 08/15/21 0800  BP: 99/63 (!) 103/52 113/81 111/60  Pulse: (!) 110 (!) 104 76 (!) 105  Resp: 16 16 16 16   Temp: (!) 97.4 F (36.3 C) (!) 97 F (36.1 C) (!) 97.5 F (36.4 C) (!) 97.5 F (36.4 C)  TempSrc: Oral Axillary Oral Oral  SpO2: 93% 98% 100% 98%  Weight:      Height:        Intake/Output Summary (Last 24 hours) at 08/15/2021 1514 Last data filed at 08/15/2021 1300 Gross per 24 hour  Intake 240 ml  Output 450 ml  Net -210 ml   Filed Weights   08/09/21 1342 08/09/21 2322  Weight: 107 kg 102.1 kg    Examination:  General exam: Appears calm and comfortable  Respiratory system: Clear to auscultation. Respiratory effort normal. Cardiovascular system: Irregular.  No JVD, murmurs, rubs, gallops or clicks. No pedal edema. Gastrointestinal system: Abdomen is nondistended, soft and nontender. No organomegaly or masses felt. Normal bowel sounds heard. Central nervous system: Alert and oriented x2. No focal neurological deficits. Extremities: Symmetric 5 x 5 power. Skin: No rashes, lesions or ulcers Psychiatry: Judgement and insight appear normal. Mood & affect appropriate.  Data Reviewed: I have personally reviewed following labs and imaging studies  CBC: Recent Labs  Lab 08/09/21 1344 08/11/21 0624 08/12/21 0426 08/13/21 0653  WBC 6.0 6.0 5.1 8.1  NEUTROABS 4.9  --   --  6.5  HGB 13.6 13.2 12.6* 13.6  HCT 39.4 37.5* 37.0* 39.8  MCV 103.1*  101.9* 103.1* 101.5*  PLT 48* 55* 48* 92*   Basic Metabolic Panel: Recent Labs  Lab 08/09/21 1344 08/11/21 0624 08/12/21 0426 08/13/21 0653 08/14/21 0633  NA 134* 130* 133* 133*  --   K 3.1* 3.5 3.5 3.8  --   CL 96* 95* 97* 95*  --   CO2 27 25 28 28   --   GLUCOSE 105* 81 107* 108*  --   BUN 17 18 20  24*  --   CREATININE 0.55* 0.50* 0.46* 0.49*  --   CALCIUM 7.2* 7.0* 6.9* 7.0*  --   MG  --  1.9 2.2  --   --   PHOS  --  2.3* 2.4* 2.0* 2.2*   GFR: Estimated Creatinine Clearance: 81.1 mL/min (A) (by C-G formula based on SCr of 0.49 mg/dL (L)). Liver Function Tests: Recent Labs  Lab 08/09/21 1344 08/11/21 0624 08/12/21 0426  AST 25 27 19   ALT 19 16 14   ALKPHOS 73 67 66  BILITOT 5.0* 4.0* 2.7*  PROT 5.4* 4.9* 4.6*  ALBUMIN 2.7* 2.5* 2.3*   No results for input(s): LIPASE, AMYLASE in the last 168 hours. No results for input(s): AMMONIA in the last 168 hours. Coagulation Profile: No results for input(s): INR, PROTIME in the last 168 hours. Cardiac Enzymes: No results for input(s): CKTOTAL, CKMB, CKMBINDEX, TROPONINI in the last 168 hours. BNP (last 3 results) No results for input(s): PROBNP in the last 8760 hours. HbA1C: No results for input(s): HGBA1C in the last 72 hours. CBG: No results for input(s): GLUCAP in the last 168 hours. Lipid Profile: No results for input(s): CHOL, HDL, LDLCALC, TRIG, CHOLHDL, LDLDIRECT in the last 72 hours. Thyroid Function Tests: No results for input(s): TSH, T4TOTAL, FREET4, T3FREE, THYROIDAB in the last 72 hours. Anemia Panel: No results for input(s): VITAMINB12, FOLATE, FERRITIN, TIBC, IRON, RETICCTPCT in the last 72 hours. Sepsis Labs: Recent Labs  Lab 08/09/21 1344 08/09/21 2315  PROCALCITON  --  0.22  LATICACIDVEN 2.5* 2.7*    Recent Results (from the past 240 hour(s))  Resp Panel by RT-PCR (Flu A&B, Covid) Nasopharyngeal Swab     Status: None   Collection Time: 08/09/21 11:15 PM   Specimen: Nasopharyngeal Swab;  Nasopharyngeal(NP) swabs in vial transport medium  Result Value Ref Range Status   SARS Coronavirus 2 by RT PCR NEGATIVE NEGATIVE Final    Comment: (NOTE) SARS-CoV-2 target nucleic acids are NOT DETECTED.  The SARS-CoV-2 RNA is generally detectable in upper respiratory specimens during the acute phase of infection. The lowest concentration of SARS-CoV-2 viral copies this assay can detect is 138 copies/mL. A negative result does not preclude SARS-Cov-2 infection and should not be used as the sole basis for treatment or other patient management decisions. A negative result may occur with  improper specimen collection/handling, submission of specimen other than nasopharyngeal swab, presence of viral mutation(s) within the areas targeted by this assay, and inadequate number of viral copies(<138 copies/mL). A negative result must be combined with clinical observations, patient history, and epidemiological information. The expected result is Negative.  Fact Sheet for Patients:  EntrepreneurPulse.com.au  Fact Sheet for Healthcare Providers:  IncredibleEmployment.be  This test  is no t yet approved or cleared by the [place] and  has been authorized for detection and/or diagnosis of SARS-CoV-2 by FDA under an Emergency Use Authorization (EUA). This EUA will remain  in effect (meaning this test can be used) for the duration of the COVID-19 declaration under Section 564(b)(1) of the Act, 21 U.S.C.section 360bbb-3(b)(1), unless the authorization is terminated  or revoked sooner.       Influenza A by PCR NEGATIVE NEGATIVE Final   Influenza B by PCR NEGATIVE NEGATIVE Final    Comment: (NOTE) The Xpert Xpress SARS-CoV-2/FLU/RSV plus assay is intended as an aid in the diagnosis of influenza from Nasopharyngeal swab specimens and should not be used as a sole basis for treatment. Nasal washings and aspirates are unacceptable for Xpert Xpress  SARS-CoV-2/FLU/RSV testing.  Fact Sheet for Patients: EntrepreneurPulse.com.au  Fact Sheet for Healthcare Providers: IncredibleEmployment.be  This test is not yet approved or cleared by the [place] FDA and has been authorized for detection and/or diagnosis of SARS-CoV-2 by FDA under an Emergency Use Authorization (EUA). This EUA will remain in effect (meaning this test can be used) for the duration of the COVID-19 declaration under Section 564(b)(1) of the Act, 21 U.S.C. section 360bbb-3(b)(1), unless the authorization is terminated or revoked.  Performed at [facility], [address] [place], [place] [address]   Blood culture (routine x 2)     Status: None   Collection Time: 08/09/21 11:15 PM   Specimen: BLOOD  Result Value Ref Range Status   Specimen Description BLOOD RIGHT HAND  Final   Special Requests   Final    BOTTLES DRAWN AEROBIC AND ANAEROBIC Blood Culture adequate volume   Culture   Final    NO GROWTH 5 DAYS Performed at [facility], [address] [place], [place] [address]    Report Status 08/15/2021 FINAL  Final         Radiology Studies: No results found.      Scheduled Meds:  furosemide  40 mg Oral Daily   gabapentin  400 mg Oral QID   metoprolol succinate  25 mg Oral Daily   mirtazapine  15 mg Oral QHS   phosphorus  500 mg Oral TID   tamsulosin  0.4 mg Oral Daily   Continuous Infusions:  cefTRIAXone (ROCEPHIN)  IV 1 g (08/15/21 0445)     LOS: 5 days    Time spent: 14 minutes    Sharen Hones, MD [facility]   To contact the attending provider between 7A-7P or the covering provider during after hours 7P-7A, please log into the web site [email or web address] and access using universal [place] password for that web site. If you do not have the password, please call the hospital operator.  08/15/2021, 3:14 PM

## 2021-08-16 DIAGNOSIS — I480 Paroxysmal atrial fibrillation: Secondary | ICD-10-CM

## 2021-08-16 DIAGNOSIS — J438 Other emphysema: Secondary | ICD-10-CM | POA: Diagnosis not present

## 2021-08-16 DIAGNOSIS — L03115 Cellulitis of right lower limb: Secondary | ICD-10-CM | POA: Diagnosis not present

## 2021-08-16 MED ORDER — LOPERAMIDE HCL 2 MG PO CAPS: 2.0000 mg | ORAL_CAPSULE | ORAL | Status: DC | PRN

## 2021-08-16 MED ORDER — GLYCOPYRROLATE 0.2 MG/ML IJ SOLN: 0.2000 mg | INTRAMUSCULAR | Status: DC | PRN

## 2021-08-16 MED ORDER — GLYCOPYRROLATE 1 MG PO TABS
1.0000 mg | ORAL_TABLET | ORAL | Status: DC | PRN
  Filled 2021-08-16: qty 1

## 2021-08-16 MED ORDER — ALBUTEROL SULFATE (2.5 MG/3ML) 0.083% IN NEBU: 2.5000 mg | INHALATION_SOLUTION | RESPIRATORY_TRACT | Status: DC | PRN

## 2021-08-16 NOTE — Progress Notes (Signed)
°   08/16/21 0018  Assess: MEWS Score  Temp 98.3 F (36.8 C)  BP 116/68  Pulse Rate (!) 114  Resp 20  SpO2 93 %  O2 Device Nasal Cannula  Assess: MEWS Score  MEWS Temp 0  MEWS Systolic 0  MEWS Pulse 2  MEWS RR 0  MEWS LOC 0  MEWS Score 2  MEWS Score Color Yellow  Assess: if the MEWS score is Yellow or Red  Were vital signs taken at a resting state? Yes  Focused Assessment No change from prior assessment  Does the patient meet 2 or more of the SIRS criteria? Yes  Does the patient have a confirmed or suspected source of infection? Yes  Provider and Rapid Response Notified? No  MEWS guidelines implemented *See Row Information* No, other (Comment) (administered xanax)  Treat  MEWS Interventions Administered prn meds/treatments  Notify: Charge Nurse/RN  Name of Charge Nurse/RN Notified Jackie, RN  Date Charge Nurse/RN Notified 08/16/21  Time Charge Nurse/RN Notified 0025  Document  Progress note created (see row info) Yes  Assess: SIRS CRITERIA  SIRS Temperature  0  SIRS Pulse 1  SIRS Respirations  0  SIRS WBC 0  SIRS Score Sum  1   Pt having some anxiety regarding breathing - O2 sats WNL.  Gave xanax for anxiety.

## 2021-08-16 NOTE — Progress Notes (Signed)
[facility]) Hospital Liaison Note   Unfortunately, Hospice Home is not able to offer a room today. Family and Dreama Saa, RN [facility] Manager aware hospital liaison will follow up tomorrow or sooner if a room becomes available.    Please do not hesitate to call with any hospice related questions.    Thank you for the opportunity to participate in this patient's care.   Bobbie "Loren Racer, RN, BSN [facility] Liaison [phone]

## 2021-08-16 NOTE — Progress Notes (Signed)
PROGRESS NOTE    Earl Ramirez  XBM:[id number] DOB: 10/08/1940 DOA: 08/10/2021 PCP: Baxter Hire, MD    Brief Narrative:  Patient is 81 year old male with history of stage IV prostate cancer on Xtandi, bladder outlet obstruction status post TURP, status post aortic valve replacement, A. 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    Patient was admitted for the management of right lower extremity cellulitis, right lower extremity ulcer.  Started on antibiotics.  Patient lives alone.  Patient expressed desire to be enrolled with hospice and does not want any anything to prolong his life. Hospice services consulted.  He is waiting for bed at residential hospice.   1/12.  Patient has completed 7 days of IV antibiotics, discussed with POA, stepdaughter, will initiate comfort care.  Assessment & Plan:   Principal Problem:   Cellulitis Active Problems:   Thrombocytopenia (HCC)   Prostate cancer (HCC)   COPD (chronic obstructive pulmonary disease) (HCC)   Sleep apnea   Lymphedema   Atrial fibrillation (HCC)   Liver cirrhosis (HCC)   Portal hypertension (HCC)   Pressure injury of skin   Hyponatremia   Hypokalemia  Right lower extremity cellulitis:  Completed 7 days of antibiotics.  Paroxysmal atrial fibrillation. Chronic thrombocytopenia. Continue metoprolol for comfort care.  Chronic lymphedema. Mild elevation of BNP. Continue Lasix 40 mg daily.  Hypokalemia Hyponatremia. Hypophosphatemia Improved.   Stage IV prostate cancer. Status post TURP.  Discussed with patient daughter-in-law, initiate comfort care while waiting for inpatient hospice admission.  DVT prophylaxis: SCDs Code Status: DNR Family Communication:  Disposition Plan:      Status is: Inpatient   Remains inpatient  appropriate because: Unsafe discharge, pending transfer to inpatient hospice     I/O last 3 completed shifts: In: 0  Out: 300 [Urine:300] No intake/output data recorded.   Subjective: Patient slept well last night, does not feel short of breath this morning. He still eating without nausea vomiting. No dysuria hematuria  Objective: Vitals:   08/16/21 0018 08/16/21 0300 08/16/21 0447 08/16/21 0736  BP: 116/68  (!) 116/53 (!) 94/53  Pulse: (!) 114 90 (!) 109 (!) 109  Resp: 20  19 16   Temp: 98.3 F (36.8 C)  98.3 F (36.8 C) (!) 97.5 F (36.4 C)  TempSrc:    Oral  SpO2: 93% 100% 96% 100%  Weight:      Height:        Intake/Output Summary (Last 24 hours) at 08/16/2021 1029 Last data filed at 08/16/2021 0553 Gross per 24 hour  Intake 0 ml  Output 300 ml  Net -300 ml   Filed Weights   08/09/21 1342 08/09/21 2322  Weight: 107 kg 102.1 kg    Examination:  General exam: Appears calm and comfortable, ill appearing Respiratory system: Clear to auscultation. Respiratory effort normal. Cardiovascular system: S1 & S2 heard, RRR. No JVD, murmurs, rubs, gallops or clicks. No pedal edema. Gastrointestinal system: Abdomen is nondistended, soft and nontender. No organomegaly or masses felt. Normal bowel sounds heard. Central nervous system: Alert and oriented. No focal neurological deficits. Extremities: Symmetric 5 x 5 power. Skin: No rashes, lesions or ulcers Psychiatry: Judgement and insight appear normal. Mood & affect appropriate.     Data Reviewed: I have personally reviewed following labs and imaging studies  CBC: Recent Labs  Lab 08/09/21 1344 08/11/21 0624 08/12/21  1660 08/13/21 0653  WBC 6.0 6.0 5.1 8.1  NEUTROABS 4.9  --   --  6.5  HGB 13.6 13.2 12.6* 13.6  HCT 39.4 37.5* 37.0* 39.8  MCV 103.1* 101.9* 103.1* 101.5*  PLT 48* 55* 48* 92*   Basic Metabolic Panel: Recent Labs  Lab 08/09/21 1344 08/11/21 0624 08/12/21 0426 08/13/21 0653 08/14/21 0633   NA 134* 130* 133* 133*  --   K 3.1* 3.5 3.5 3.8  --   CL 96* 95* 97* 95*  --   CO2 27 25 28 28   --   GLUCOSE 105* 81 107* 108*  --   BUN 17 18 20  24*  --   CREATININE 0.55* 0.50* 0.46* 0.49*  --   CALCIUM 7.2* 7.0* 6.9* 7.0*  --   MG  --  1.9 2.2  --   --   PHOS  --  2.3* 2.4* 2.0* 2.2*   GFR: Estimated Creatinine Clearance: 81.1 mL/min (A) (by C-G formula based on SCr of 0.49 mg/dL (L)). Liver Function Tests: Recent Labs  Lab 08/09/21 1344 08/11/21 0624 08/12/21 0426  AST 25 27 19   ALT 19 16 14   ALKPHOS 73 67 66  BILITOT 5.0* 4.0* 2.7*  PROT 5.4* 4.9* 4.6*  ALBUMIN 2.7* 2.5* 2.3*   No results for input(s): LIPASE, AMYLASE in the last 168 hours. No results for input(s): AMMONIA in the last 168 hours. Coagulation Profile: No results for input(s): INR, PROTIME in the last 168 hours. Cardiac Enzymes: No results for input(s): CKTOTAL, CKMB, CKMBINDEX, TROPONINI in the last 168 hours. BNP (last 3 results) No results for input(s): PROBNP in the last 8760 hours. HbA1C: No results for input(s): HGBA1C in the last 72 hours. CBG: No results for input(s): GLUCAP in the last 168 hours. Lipid Profile: No results for input(s): CHOL, HDL, LDLCALC, TRIG, CHOLHDL, LDLDIRECT in the last 72 hours. Thyroid Function Tests: No results for input(s): TSH, T4TOTAL, FREET4, T3FREE, THYROIDAB in the last 72 hours. Anemia Panel: No results for input(s): VITAMINB12, FOLATE, FERRITIN, TIBC, IRON, RETICCTPCT in the last 72 hours. Sepsis Labs: Recent Labs  Lab 08/09/21 1344 08/09/21 2315  PROCALCITON  --  0.22  LATICACIDVEN 2.5* 2.7*    Recent Results (from the past 240 hour(s))  Resp Panel by RT-PCR (Flu A&B, Covid) Nasopharyngeal Swab     Status: None   Collection Time: 08/09/21 11:15 PM   Specimen: Nasopharyngeal Swab; Nasopharyngeal(NP) swabs in vial transport medium  Result Value Ref Range Status   SARS Coronavirus 2 by RT PCR NEGATIVE NEGATIVE Final    Comment: (NOTE) SARS-CoV-2  target nucleic acids are NOT DETECTED.  The SARS-CoV-2 RNA is generally detectable in upper respiratory specimens during the acute phase of infection. The lowest concentration of SARS-CoV-2 viral copies this assay can detect is 138 copies/mL. A negative result does not preclude SARS-Cov-2 infection and should not be used as the sole basis for treatment or other patient management decisions. A negative result may occur with  improper specimen collection/handling, submission of specimen other than nasopharyngeal swab, presence of viral mutation(s) within the areas targeted by this assay, and inadequate number of viral copies(<138 copies/mL). A negative result must be combined with clinical observations, patient history, and epidemiological information. The expected result is Negative.  Fact Sheet for Patients:  EntrepreneurPulse.com.au  Fact Sheet for Healthcare Providers:  IncredibleEmployment.be  This test is no t yet approved or cleared by the [place] FDA and  has been authorized for detection and/or diagnosis of  SARS-CoV-2 by FDA under an Emergency Use Authorization (EUA). This EUA will remain  in effect (meaning this test can be used) for the duration of the COVID-19 declaration under Section 564(b)(1) of the Act, 21 U.S.C.section 360bbb-3(b)(1), unless the authorization is terminated  or revoked sooner.       Influenza A by PCR NEGATIVE NEGATIVE Final   Influenza B by PCR NEGATIVE NEGATIVE Final    Comment: (NOTE) The Xpert Xpress SARS-CoV-2/FLU/RSV plus assay is intended as an aid in the diagnosis of influenza from Nasopharyngeal swab specimens and should not be used as a sole basis for treatment. Nasal washings and aspirates are unacceptable for Xpert Xpress SARS-CoV-2/FLU/RSV testing.  Fact Sheet for Patients: EntrepreneurPulse.com.au  Fact Sheet for Healthcare  Providers: IncredibleEmployment.be  This test is not yet approved or cleared by the [place] FDA and has been authorized for detection and/or diagnosis of SARS-CoV-2 by FDA under an Emergency Use Authorization (EUA). This EUA will remain in effect (meaning this test can be used) for the duration of the COVID-19 declaration under Section 564(b)(1) of the Act, 21 U.S.C. section 360bbb-3(b)(1), unless the authorization is terminated or revoked.  Performed at [facility], [address] [place], [place] [address]   Blood culture (routine x 2)     Status: None   Collection Time: 08/09/21 11:15 PM   Specimen: BLOOD  Result Value Ref Range Status   Specimen Description BLOOD RIGHT HAND  Final   Special Requests   Final    BOTTLES DRAWN AEROBIC AND ANAEROBIC Blood Culture adequate volume   Culture   Final    NO GROWTH 5 DAYS Performed at [facility], [address], [place], [place] [address]    Report Status 08/15/2021 FINAL  Final         Radiology Studies: No results found.      Scheduled Meds:  furosemide  40 mg Oral Daily   gabapentin  400 mg Oral QID   metoprolol succinate  25 mg Oral Daily   mirtazapine  15 mg Oral QHS   tamsulosin  0.4 mg Oral Daily   Continuous Infusions:   LOS: 6 days    Time spent: 27 minutes    Sharen Hones, MD [facility]   To contact the attending provider between 7A-7P or the covering provider during after hours 7P-7A, please log into the web site [email or web address] and access using universal [place] password for that web site. If you do not have the password, please call the hospital operator.  08/16/2021, 10:29 AM

## 2021-08-17 DIAGNOSIS — C61 Malignant neoplasm of prostate: Secondary | ICD-10-CM

## 2021-08-17 MED ORDER — ALBUTEROL SULFATE (2.5 MG/3ML) 0.083% IN NEBU
2.5000 mg | INHALATION_SOLUTION | RESPIRATORY_TRACT | 12 refills | Status: AC | PRN
Start: 2021-08-17 — End: ?

## 2021-08-17 MED ORDER — LOPERAMIDE HCL 2 MG PO CAPS: 2.0000 mg | ORAL_CAPSULE | ORAL | 0 refills | Status: AC | PRN

## 2021-08-17 MED ORDER — GLYCOPYRROLATE 1 MG PO TABS: 1.0000 mg | ORAL_TABLET | ORAL | Status: AC | PRN

## 2021-08-17 MED ORDER — HYDROMORPHONE HCL 1 MG/ML IJ SOLN: 0.5000 mg | INTRAMUSCULAR | 0 refills | Status: AC | PRN

## 2021-08-17 MED ORDER — ONDANSETRON HCL 4 MG PO TABS
4.0000 mg | ORAL_TABLET | Freq: Four times a day (QID) | ORAL | 0 refills | Status: AC | PRN
Start: 2021-08-17 — End: ?

## 2021-08-17 NOTE — Discharge Summary (Signed)
Physician Discharge Summary  Patient ID: Earl Ramirez MRN: [id number] DOB/AGE: 07-Jun-1941 81 y.o.  Admit date: 08/10/2021 Discharge date: 08/17/2021  Admission Diagnoses:  Discharge Diagnoses:  Principal Problem:   Cellulitis Active Problems:   Thrombocytopenia ([place])   Prostate cancer (HCC)   COPD (chronic obstructive pulmonary disease) (HCC)   Sleep apnea   Lymphedema   Atrial fibrillation (HCC)   Liver cirrhosis (HCC)   Portal hypertension (HCC)   Pressure injury of skin   Hyponatremia   Hypokalemia   Discharged Condition: poor  Hospital Course:  Patient is 81 year old male with history of stage IV prostate cancer on Xtandi, bladder outlet obstruction status post TURP, status post aortic valve replacement, A. 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    Patient was admitted for the management of right lower extremity cellulitis, right lower extremity ulcer.  Started on antibiotics.  Patient lives alone.  Patient expressed desire to be enrolled with hospice and does not want any anything to prolong his life. Hospice services consulted.  He is waiting for bed at residential hospice.   1/12.  Patient has completed 7 days of IV antibiotics, discussed with POA, stepdaughter, will initiate comfort care.  Right lower extremity cellulitis:  Completed 7 days of antibiotics.  Paroxysmal atrial fibrillation. Chronic thrombocytopenia. Continue metoprolol for comfort care.  Chronic lymphedema. Mild elevation of BNP. Continue Lasix orally  Hypokalemia Hyponatremia. Hypophosphatemia Improved.   Stage IV prostate cancer. Status post TURP.  Pressure ulcers POA Pressure Injury 08/10/21 Sacrum Mid Stage 2 -  Partial thickness loss of dermis presenting as a shallow open injury with a red, pink wound  bed without slough. (Active)  08/10/21 1815  Location: Sacrum  Location Orientation: Mid  Staging: Stage 2 -  Partial thickness loss of dermis presenting as a shallow open injury with a red, pink wound bed without slough.  Wound Description (Comments):   Present on Admission: Yes        Consults: None  Significant Diagnostic Studies:   Treatments: antiobiotcs  Discharge Exam: Blood pressure 107/69, pulse (!) 110, temperature (!) 97.4 F (36.3 C), resp. rate 18, height 5\' 7"  (1.702 m), weight 102.1 kg, SpO2 93 %. General appearance: alert and cooperative Resp: Decreased breathing sounds. Cardio: regular rate and rhythm, S1, S2 normal, no murmur, click, rub or gallop GI: soft, non-tender; bowel sounds normal; no masses,  no organomegaly Extremities: extremities normal, atraumatic, no cyanosis or edema  Disposition: Discharge disposition: 51-Hospice/Medical Facility       Discharge Instructions     Diet - low sodium heart healthy   Complete by: As directed    Discharge wound care:   Complete by: As directed    Follow with hospce   Increase activity slowly   Complete by: As directed       Allergies as of 08/17/2021       Reactions   Oxycodone-acetaminophen Nausea Only, Shortness Of Breath   Oxycodone-acetaminophen    Other reaction(s): Drowsy   Amoxicillin Itching   Hand itching   Penicillins Rash   Did it involve swelling of the face/tongue/throat, SOB, or low BP? No Did it involve sudden or severe rash/hives, skin peeling, or any reaction on the inside of your mouth or nose? No Did you need to seek medical attention at a hospital or doctor's office? No When did it last  happen?      4-5 years ago If all above answers are NO, may proceed with cephalosporin use.        Medication List     STOP taking these medications    aspirin EC 81 MG tablet   mirabegron ER 50 MG Tb24 tablet Commonly known as: MYRBETRIQ   multivitamin with minerals tablet    oxybutynin 10 MG 24 hr tablet Commonly known as: DITROPAN-XL   traMADol 50 MG tablet Commonly known as: ULTRAM   Xtandi 40 MG tablet Generic drug: enzalutamide       TAKE these medications    albuterol (2.5 MG/3ML) 0.083% nebulizer solution Commonly known as: PROVENTIL Take 3 mLs (2.5 mg total) by nebulization every 2 (two) hours as needed for wheezing.   ALPRAZolam 0.25 MG tablet Commonly known as: XANAX Take 1 tablet (0.25 mg total) by mouth daily as needed for up to 4 doses for anxiety. What changed: when to take this   Calcium Carbonate Antacid 600 MG chewable tablet Chew 1 tablet by mouth every other day.   docusate sodium 100 MG capsule Commonly known as: COLACE Take 100 mg by mouth daily as needed for mild constipation.   furosemide 20 MG tablet Commonly known as: LASIX Take 20 mg by mouth daily.   gabapentin 300 MG capsule Commonly known as: NEURONTIN Take 900 mg by mouth 4 (four) times daily.   glycopyrrolate 1 MG tablet Commonly known as: ROBINUL Take 1 tablet (1 mg total) by mouth every 4 (four) hours as needed (excessive secretions).   HYDROmorphone 1 MG/ML injection Commonly known as: DILAUDID Inject 0.5 mLs (0.5 mg total) into the vein every 4 (four) hours as needed for severe pain.   loperamide 2 MG capsule Commonly known as: IMODIUM Take 1 capsule (2 mg total) by mouth as needed for diarrhea or loose stools.   metoprolol succinate 25 MG 24 hr tablet Commonly known as: TOPROL-XL Take 25 mg by mouth daily.   ondansetron 4 MG tablet Commonly known as: ZOFRAN Take 1 tablet (4 mg total) by mouth every 6 (six) hours as needed for nausea.   tamsulosin 0.4 MG Caps capsule Commonly known as: FLOMAX Take 1 capsule by mouth twice daily What changed: when to take this               Discharge Care Instructions  (From admission, onward)           Start     Ordered   08/17/21 0000  Discharge wound care:       Comments: Follow with  hospce   08/17/21 0906             Signed: Sharen Hones 08/17/2021, 9:06 AM

## 2021-08-17 NOTE — Progress Notes (Addendum)
Called Hospice and gave report to Sharlyne Pacas RN  [phone].  Per Hospice nurse please leave IV  in

## 2021-08-17 NOTE — Progress Notes (Addendum)
[facility] Liaison Note  Consent forms to be completed at 11:00a at the [facility].  EMS to be notified of patient D/C and transport arranged once consents are complete. TOC and Attending Physician also notified of above updates.   Please send signed DNR form with patient and RN call report to [phone].   Addendum: Transport is arranged for 1p through [facility]. Scheduled by [facility]. IDT, family & [facility] staff aware.   Daphene Calamity, MSW [facility] Liaison [phone]

## 2021-08-17 NOTE — TOC Progression Note (Signed)
Transition of Care [facility]) - Progression Note    Patient Details  Name: Earl Ramirez MRN: [id number] Date of Birth: 12/16/1940  Transition of Care [facility]) CM/SW [place], RN Phone Number: 08/17/2021, 10:59 AM  Clinical Narrative:   patient will discharge to hospice home [place] today.  Lorayne Bender from [facility] to set up EMS         Expected Discharge Plan and Services           Expected Discharge Date: 08/17/21                                     Social Determinants of Health (SDOH) Interventions    Readmission Risk Interventions No flowsheet data found.

## 2021-08-17 NOTE — Care Management Important Message (Signed)
Important Message  Patient Details  Name: Earl Ramirez MRN: [id number] Date of Birth: Jul 14, 1941   Medicare Important Message Given:  Other (see comment)  Pending hospice home transfer once bed available.  Medicare IM withheld at this time.    Dannette Barbara 08/17/2021, 8:47 AM

## 2021-09-03 ENCOUNTER — Other Ambulatory Visit: Payer: PPO | Admitting: Primary Care

## 2021-09-05 ENCOUNTER — Other Ambulatory Visit (HOSPITAL_COMMUNITY): Payer: Self-pay

## 2021-09-05 DEATH — deceased

## 2021-09-27 ENCOUNTER — Other Ambulatory Visit (HOSPITAL_COMMUNITY): Payer: Self-pay

## 2022-05-22 ENCOUNTER — Ambulatory Visit: Payer: PPO | Admitting: Urology

## 2023-10-22 ENCOUNTER — Other Ambulatory Visit (HOSPITAL_COMMUNITY): Payer: Self-pay
